# Patient Record
Sex: Male | Born: 1955 | Race: White | Hispanic: No | Marital: Single | Smoking: Former smoker
Health system: Southern US, Community
[De-identification: ages and names within clinical notes are randomized; demographics above are authoritative.]

## PROBLEM LIST (undated history)

## (undated) DIAGNOSIS — K219 Gastro-esophageal reflux disease without esophagitis: Secondary | ICD-10-CM

## (undated) DIAGNOSIS — E785 Hyperlipidemia, unspecified: Secondary | ICD-10-CM

## (undated) DIAGNOSIS — I639 Cerebral infarction, unspecified: Secondary | ICD-10-CM

## (undated) DIAGNOSIS — H269 Unspecified cataract: Secondary | ICD-10-CM

## (undated) DIAGNOSIS — I4819 Other persistent atrial fibrillation: Secondary | ICD-10-CM

## (undated) DIAGNOSIS — E119 Type 2 diabetes mellitus without complications: Secondary | ICD-10-CM

## (undated) DIAGNOSIS — F1010 Alcohol abuse, uncomplicated: Secondary | ICD-10-CM

## (undated) DIAGNOSIS — K5790 Diverticulosis of intestine, part unspecified, without perforation or abscess without bleeding: Secondary | ICD-10-CM

## (undated) DIAGNOSIS — G709 Myoneural disorder, unspecified: Secondary | ICD-10-CM

## (undated) DIAGNOSIS — I517 Cardiomegaly: Secondary | ICD-10-CM

## (undated) DIAGNOSIS — Z0189 Encounter for other specified special examinations: Secondary | ICD-10-CM

## (undated) DIAGNOSIS — I10 Essential (primary) hypertension: Secondary | ICD-10-CM

## (undated) HISTORY — DX: Cardiomegaly: I51.7

## (undated) HISTORY — DX: Unspecified cataract: H26.9

## (undated) HISTORY — DX: Hyperlipidemia, unspecified: E78.5

## (undated) HISTORY — DX: Type 2 diabetes mellitus without complications: E11.9

## (undated) HISTORY — DX: Other persistent atrial fibrillation: I48.19

## (undated) HISTORY — PX: CATARACT EXTRACTION: SUR2

## (undated) HISTORY — DX: Essential (primary) hypertension: I10

## (undated) HISTORY — PX: EYE SURGERY: SHX253

## (undated) HISTORY — PX: COLON SURGERY: SHX602

## (undated) HISTORY — PX: OSTOMY: SHX5997

## (undated) HISTORY — PX: INNER EAR SURGERY: SHX679

## (undated) HISTORY — DX: Alcohol abuse, uncomplicated: F10.10

## (undated) HISTORY — DX: Diverticulosis of intestine, part unspecified, without perforation or abscess without bleeding: K57.90

## (undated) HISTORY — PX: APPENDECTOMY: SHX54

## (undated) HISTORY — PX: COLONOSCOPY: SHX174

---

## 1977-02-24 HISTORY — PX: HAND SURGERY: SHX662

## 2004-05-27 HISTORY — PX: COLON SURGERY: SHX602

## 2004-09-14 ENCOUNTER — Inpatient Hospital Stay (HOSPITAL_COMMUNITY): Admission: EM | Admit: 2004-09-14 | Discharge: 2004-10-02 | Payer: Self-pay | Admitting: *Deleted

## 2004-09-14 ENCOUNTER — Encounter (INDEPENDENT_AMBULATORY_CARE_PROVIDER_SITE_OTHER): Payer: Self-pay | Admitting: *Deleted

## 2004-09-16 ENCOUNTER — Ambulatory Visit: Payer: Self-pay | Admitting: Internal Medicine

## 2004-09-17 ENCOUNTER — Encounter (INDEPENDENT_AMBULATORY_CARE_PROVIDER_SITE_OTHER): Payer: Self-pay | Admitting: *Deleted

## 2005-11-12 ENCOUNTER — Ambulatory Visit: Payer: Self-pay | Admitting: Internal Medicine

## 2005-11-22 ENCOUNTER — Ambulatory Visit: Payer: Self-pay | Admitting: Internal Medicine

## 2007-06-18 ENCOUNTER — Ambulatory Visit (HOSPITAL_BASED_OUTPATIENT_CLINIC_OR_DEPARTMENT_OTHER): Admission: RE | Admit: 2007-06-18 | Discharge: 2007-06-18 | Payer: Self-pay | Admitting: Orthopedic Surgery

## 2010-10-09 NOTE — Op Note (Signed)
NAME:  Angel Shelton, Angel Shelton NO.:  [id number]   MEDICAL RECORD NO.:  [id number]          PATIENT TYPE:  AMB   LOCATION:  DSC                          FACILITY:  [facility]   PHYSICIAN:  Leonides Grills, M.D.     DATE OF BIRTH:  19-Jun-1955   DATE OF PROCEDURE:  06/18/2007  DATE OF DISCHARGE:                               OPERATIVE REPORT   PREOPERATIVE DIAGNOSIS:  Closed right comminuted great toe proximal  phalanx fracture.   POSTOPERATIVE DIAGNOSIS:  Closed right comminuted great toe proximal  phalanx fracture.   OPERATION PERFORMED:  1. Open reduction,  internal fixation right great toe proximal phalanx      fracture.  2. Stress x-rays.   ANESTHESIA:  General.   SURGEON:  Leonides Grills, M.D.   ASSISTANT:  Richardean Canal, P.A.   ESTIMATED BLOOD LOSS:  Minimal.   TOURNIQUET TIME:  Approximately 40 minutes.   COMPLICATIONS:  None.   DISPOSITION:  Stable to the PAR.   INDICATIONS:  This is a 55 year old male who three weeks ago stubbed his  toe against the bed post sustaining the above injury.  He did not seek  any medical attention until yesterday at which point we evaluated him  and found that he had a comminuted and deformed right great toe proximal  phalanx fracture that was closed.  He has diabetes with possible  underlying neuropathy.  Previous consent for the above procedure.  All  risks including infection, neurovascular injury, nonunion, malunion of  hard tissue or other failures, persistent pain, worsened pain, prolonged  recovery __________  arthritis, the possibility of Charcot arthropathy,  loss of fixation were all explained, questions encouraged and answered.   DESCRIPTION OF PROCEDURE:  The patient was brought to the operating room  and placed in the supine position.  After adequate general endotracheal  anesthesia was administered as well as 1 g Ancef IVPB, the right lower  extremity was then prepped and draped in a sterile manner.  Proximal  thigh  tourniquet, the limb was gravity exsanguinated.  Tourniquet was  elevated to 190 mmHg.  A longitudinal incision dorsal aspect of the  right great toe, the proximal phalanx was then made.  Dissection was  carried out through skin.  Hemostasis was obtained.  EHL tendon was  identified and protected within its sheath and retracted out of harm's  way.  The fracture site was then entered.  There was an organized  hematoma that was debrided.  Fracture site was then freed up, and the  fracture was then reduced.  This was difficult due to the chronicity of  the injury and amount of scar tissue shortening in an extended position.  Once this was anatomically reduced with a two-point reduction clamp, we  then placed two 1.6 mm K-wires to provisionally fix this.  I felt that  due to the amount of comminution and the instability in the oblique  plane of the fracture and tendency to go into extension valgus, I wanted  to have axial fixation with the K-wires in a criss-cross manner.  However, due to the dorsal comminution, I did not  want to leave this in  this place.  I felt that he needed the dorsal plate to augment the  repair.  A mini fragment plate was then applied to the dorsal aspect of  the proximal phalanx and then four 1.5 mm fully threaded cortical set  screws were placed using 1.1 mm drill  holes respectively.  This had  good purchase and maintenance of the correction.  The tourniquet was  deflated.  hemostasis was obtained.  Subcu was closed with 3-0 Vicryl.  The skin was closed with 4-0 nylon.  Stress x-rays were obtained in a AP  and lateral planes.  No gross motion across the fixation site.  Fixation  and proposition in excellent alignment as well.  After the wound was  closed with 4-0 nylon, sterile dressing was applied.  Darco shoe was  applied.  The patient was stable to the PAR.      Leonides Grills, M.D.  Electronically Signed     PB/MEDQ  D:  06/18/2007  T:  06/18/2007  Job:   [id number]

## 2010-10-12 NOTE — Discharge Summary (Signed)
NAME:  Angel Shelton, Angel Shelton NO.:  [id number]   MEDICAL RECORD NO.:  [id number]          PATIENT TYPE:  INP   LOCATION:  5742                         FACILITY:  [facility]   PHYSICIAN:  Sharlet Salina T. Hoxworth, M.D.DATE OF BIRTH:  February 08, 1956   DATE OF ADMISSION:  09/14/2004  DATE OF DISCHARGE:  10/02/2004                                 DISCHARGE SUMMARY   DISCHARGE DIAGNOSES:  1.  Perforated sigmoid colon with peritonitis as well as septic shock,      status post colectomy, colostomy, and incidental appendectomy.  2.  Interloop fluid collection, resolving, status post intravenous      antibiotic treatment as an inpatient.  3.  Diabetes mellitus, treated.  4.  Postoperative atrial fibrillation, resolved.  5.  Polysubstance abuse (alcohol and tobacco).   HOSPITAL COURSE:  [name] is a 55 year old male patient who presented  to the emergency room on the day of admission complaining of upper  respiratory infection symptoms.  He had been taking a significant amount of  Advil for these symptoms.  In addition, he has significant alcohol use of  drinking six to eight beers per night.  In the emergency room while being  seen for his upper respiratory symptoms, he began having severe abdominal  pain, bloating, and developed a rigid abdomen.  An abdominal x-ray revealed  free air consistent with a perforated viscous.   He was taken to the operating room emergently and was found to have a  perforated sigmoid colon with diffuse peritonitis.  He then underwent an  exploratory laparotomy, sigmoid colectomy, colostomy, and an incidental  appendectomy by Dr. Avel Peace.  During his hospitalization he did  experience postoperative atrial fibrillation and was seen by [facility].  He was initially treated with IV amiodarone and digoxin,  however, by discharge he was switched to home dose of atenolol.   During this hospitalization he also was found to have an interloop  fluid  collection and was placed on IV antibiotics during his entire stay.  A  repeat CT scan revealed decreased fluid collection of the left lower  quadrant and this was felt to be resolving.  By postoperative day #17, the  patient was ready for discharge to home.  He did have an open abdominal  wound postoperatively and we have arranged a home health nurse to help him  with dressing changes on a daily basis.  He will be discharged to home in  stable condition.   DISCHARGE MEDICATIONS:  1.  Regular home medications which are Amaryl 2 mg a day, enalapril 5 mg a      day, atenolol 50 mg a day.  2.  His new medication is Vicodin 1-2 tablets q.6h. p.r.n. for pain.   He is instructed not to strain, no exercise, no lifting greater than 10  pounds.  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  He is to  follow a diabetic diet.  Wound care as per home health.  He is to call if he  has  a fever greater than 101.  He has a follow-up appointment with Dr. Avel Peace on Oct 12, 2004 at 11 a.m.      LB/MEDQ  D:  10/02/2004  T:  10/02/2004  Job:  [id number]   cc:   Adolph Pollack, M.D.  [address], [address]  [place]  [place] [address]

## 2010-10-12 NOTE — Consult Note (Signed)
NAME:  Angel Shelton, Angel Shelton NO.:  [id number]   MEDICAL RECORD NO.:  [id number]          PATIENT TYPE:  INP   LOCATION:  2114                         FACILITY:  [facility]   PHYSICIAN:  Arvilla Meres, M.D. [facility] OF BIRTH:  1956-03-18   DATE OF CONSULTATION:  09/16/2004  DATE OF DISCHARGE:                                   CONSULTATION   PRIMARY CARE PHYSICIAN:  None.   SURGEON:  Dr. Abbey Chatters.   PATIENT ID:  Angel Shelton is a very pleasant 55 year old male with a history  of hypertension, diabetes, and tobacco and alcohol abuse, but no known heart  disease, who is now postop day two from a sigmoid colectomy secondary to  intestinal rupture and diffuse peritonitis, who we are called to consult on  secondary to atrial fibrillation with rapid ventricular response.   Angel Shelton denies any previous history of known atrial fibrillation.  On  September 14, 2004 he presented to the emergency room complaining of upper  respiratory track symptoms along with high grade fevers.  While in the ER he  began developing severe abdominal pain and bloating and an abdominal x-ray  revealed free air consistent with perforated viscus.  He was taken to the  operating room and found to have a ruptured sigmoid colon with diffuse  peritonitis and septic shock.  This was debrided.  He underwent sigmoid  colectomy with appendectomy.  Postoperatively he has done fairly well.  He  was extubated quickly.  He has had some trouble with hypertension. This  afternoon around 1:00 he developed new-onset atrial fibrillation with rapid  ventricular response at a rate of 160.  This has been relatively  asymptomatic.  He was started on a Cardizem drip up to 15 mg an hour without  any result.  He did receive one dose of Lopressor with minimal affect.  We  were called for help with management.  He denies any palpitations, chest  pain or shortness of breath with this.  He does have a history of heavy  alcohol  use, but denies any history of alcohol withdrawal.   PAST MEDICAL HISTORY:  1.  Sigmoid colon perforation with diffuse peritonitis and septic shock      status post sigmoid colectomy on September 14, 2004.  2.  Hypertension.  3.  Tobacco use, 1/2 to 1 pack per day x30 years.  4.  Alcohol abuse.  5.  Diabetes mellitus.   CURRENT MEDICATIONS:  1.  IV Protonix.  2.  Morphine.  3.  Zosyn.  4.  IV fluids at 125 mL an hour.   HE HAS NO KNOWN DRUG ALLERGIES.   SOCIAL HISTORY:  [place] recently moved to [place] from [place].  He is  married.  He works as a Teacher, early years/pre.  He has a history of tobacco use 1/2  to 1 pack a day x30 years.  He drinks 6-8 beers per night.  He denies any  drug use.   FAMILY HISTORY:  His father died of an MI at age 83, his mother is alive and  well.  He has no brothers or sisters.  REVIEW OF SYSTEMS:  As per HPI.  He does complain of occasional abdominal  discomfort and fever.  Otherwise negative.   PHYSICAL EXAMINATION:  He is lying in bed, he is flushed, is comfortable,  does not appear agitated.  His respiratory status is unlabored.  His T-max  is 102.0, temperature current is 99.5, blood pressure 118/60 with a heart  rate of 140-160 in atrial fibrillation.  He is sating 97% on 3 liters.  HEENT:  Sclerae anicteric EOMI.  Mucous membranes are moist.  NECK:  Supple.  There is no JVD.  Carotids are 2+ bilaterally without any  bruits.  There is no lymphadenopathy or thyromegaly.  CARDIAC:  He is tachycardic and irregular without any murmurs, rubs or  gallops.  LUNGS:  Clear to auscultation.  ABDOMEN:  Distended, mildly firm with no bowel sounds.  His midline dressing  is clean, dry and intact.  His colostomy stoma is pink.  EXTREMITIES:  Warm, flushed.  There is no cyanosis, clubbing or edema.  NEUROLOGIC:  He is calm, appropriate affect.  Alert and oriented x3 and  otherwise nonfocal.   LABS:  White count 21.1, hematocrit 28, platelets 315.  Sodium 133,   potassium 4.0, chloride 104, bicarb 20, BUN 7, creatinine 0.8.  EKG shows  atrial fibrillation at a rate of 157 with nonspecific ST-T wave changes.   ASSESSMENT AND PLAN:  Postoperative atrial fibrillation with rapid  ventricular response.  This is clearly a stress rhythm and is not responding  to rate control efforts with beta-blocker or calcium channel blocker.  Thus  I would start IV amiodarone and discontinue his Cardizem.  He will need  continued hemodynamic support for his shock.  I also wonder whether alcohol  withdrawal may be contributing to his atrial fibrillation, though he does  not appear agitated.  I would strongly consider benzodiazepines as needed if  he gets progressively agitated.   The plan for now will be:  1.  Start amiodarone drip.  2.  No heparin at this time given the risk of bleeding.  We will start      aspirin 325 mg daily.  3.  Continue to follow on telemetry and rule out myocardial infarction with      serial enzymes.  4.  Check TSH.  5.  Continue IV fluids for hemodynamic support and check stat CBC to make      sure he is not bleeding.  6.  Consider benzodiazepines if evidence of alcohol withdrawal.      DB/MEDQ  D:  09/16/2004  T:  09/16/2004  Job:  [id number]

## 2010-10-12 NOTE — Op Note (Signed)
NAME:  Angel Shelton, TEMME NO.:  [id number]   MEDICAL RECORD NO.:  [id number]          PATIENT TYPE:  INP   LOCATION:  2550                         FACILITY:  [facility]   PHYSICIAN:  Adolph Pollack, M.D.DATE OF BIRTH:  11-04-1955   DATE OF PROCEDURE:  09/14/2004  DATE OF DISCHARGE:                                 OPERATIVE REPORT   PREOPERATIVE DIAGNOSIS:  Perforated viscus.   POSTOPERATIVE DIAGNOSES:  Perforated sigmoid colon, diffuse peritonitis.   PROCEDURE:  Exploratory laparotomy, sigmoid colectomy, colostomy, incidental  appendectomy.   SURGEON:  Adolph Pollack, M.D.   ASSISTANT:  Gita Kudo, M.D.   ANESTHESIA:  General.   INDICATIONS FOR PROCEDURE:  This is a 55 year old male who had been treated  for upper respiratory infection and developed abdominal pain that was  worsening. He presented to the emergency department here. During his  evaluation, he was found to have a pneumoperitoneum.  Subsequent surgical  consult was obtained and now he is being brought to the operating room for  exploratory laparotomy.   TECHNIQUE:  The patient was brought to the operating room, placed supine on  the operating table and general anesthetic was administered.  Hair from the  abdominal wall was clipped.  Foley catheter was placed.  Abdominal wall was  sterilely prepped and draped. An upper midline incision was made through the  skin and subcutaneous tissue, fascia and peritoneum.   Upon entering the abdominal cavity, copious amounts of cloudy, foul-smelling  fluid were encountered. I quickly examined the duodenum and stomach and  these were soft without evidence of perforation.  I then extended the  incision inferiorly.  There was a firm, indurated mass surrounded by allot  of purulent drainage. There were small bowel adhesions to the mass in the  sigmoid colon and I was able to free these up bluntly.  There was an abscess  cavity which I drained as well in  the left gutter area. This was consistent  with a perforated sigmoid colon most likely secondary to diverticulitis.   I began by mobilizing the mid descending and sigmoid colon by incising the  white line of Toldt.  I identified the gonadal vessels and kept my plane of  dissection anterior to these down into the pelvis.  I mobilized the sigmoid  colon and then divided the sigmoid colon, the sigmoid-descending colon  junction with the stapler.  Mesentery was divided between clamps close to  the colon and vessels were widely ligated. I took this down all the way to a  level below the abnormal area of colon until I found the rectosigmoid  junction which appeared to be normal. I subsequently divided the sigmoid  colon at the rectosigmoid junction and sent the specimen off the field.   I marked the rectal stump with interrupted 2-0 Prolene sutures. I then  further mobilized descending colon to be able to make a colostomy without  tension.  The appendix and mesoappendix appeared to be somewhat inflamed,  probably from being involved in the inflammatory process.  I subsequently  decided to do an incidental appendectomy.  I  divided the mesoappendix  between clamps and ligated the vessels. I then amputated the appendix off  the cecum with the stapling device and handed this off the field.   At this point, I copiously irrigated out the abdominal cavity with 8 L of  saline.  The fluid was clearing up.  There was no bleeding noted. I cleared  clots and debris out of the pelvis and the abdominal area.  I ran the entire  small bowel as well.  An NG-tube was verified within this position in the  stomach.  I asked for a sponge count and sponges were reported to be  correct.   The intestines were placed back into the abdominal cavity and omentum placed  over them.  I then closed the fascia with running #1 PDS suture and left the  skin open and packed with moist gauze via dry dressing.   I then  proceeded to mature the colostomy in a Brooke-type fashion.  I  anchored the colostomy initially to the anterior fascia with interrupted  Vicryl sutures and then matured it with interrupted 3-0 Vicryl sutures. A  stoma appliance was applied.   He tolerated the procedure fairly well. He was given a significant amount of  crystalloid.  He did have a low urine output consistent with a little bit of  a shocky state, as he was hypotensive in the emergency department.  Subsequently, he was then taken to the recovery room in critical condition,  and will be observed in critical care. He will be treated with broad-  spectrum antibiotics.  Risks of him developing recurrent infection are  fairly high.      TJR/MEDQ  D:  09/14/2004  T:  09/16/2004  Job:  [id number]

## 2010-10-12 NOTE — H&P (Signed)
NAME:  Angel Shelton, Angel Shelton NO.:  [id number]   MEDICAL RECORD NO.:  [id number]          PATIENT TYPE:  INP   LOCATION:  2114                         FACILITY:  [facility]   PHYSICIAN:  Gabrielle Dare. Janee Morn, M.D.DATE OF BIRTH:  12/11/1955   DATE OF ADMISSION:  09/14/2004  DATE OF DISCHARGE:                                HISTORY & PHYSICAL   HISTORY OF PRESENT ILLNESS:  Angel Shelton is a 55 year old male patient who  has been experiencing right ear pain and upper respiratory symptoms along  with fever for the past 3 days. He was treated initially at [facility]  with [facility] and returned to [facility] for reevaluation because he was  not getting better. He was placed on penicillin. Today, he still did not  feel better and came to the emergency room for evaluation. While in the  emergency room, he began having severe abdominal pain and bloating.  Abdominal x-ray revealed free air consistent with perforated viscous.   The patient has been taking a significant amount of Advil as well as  drinking 6-8 beers per night.   PAST MEDICAL HISTORY:  Includes diabetes and hypertension.   ALLERGIES:  No known drug allergies.   MEDICATIONS:  1.  Amaryl 2 mg a day.  2.  Enalapril 5 mg a day.  3.  Atenolol 50 mg a day.   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 He quit smoking a  week ago and he drinks 6-8 beers per night.   PHYSICAL EXAMINATION:  VITAL SIGNS:  Temperature 102.8, blood pressure is  98/50, pulse 100, respirations 20.  HEENT:  Grossly normal. No carotid or subclavian bruits. No JVD or  thyromegaly.  GENERAL:  His skin is pale and he appears to be in mild distress.  CHEST:  Clear to auscultation bilaterally. No wheezing or rhonchi.  HEART:  Regular rate and rhythm. No murmur, rubs, or ectopy.  ABDOMEN:  Mildly distended, diffusely tender, rigid. No bowel sounds. He has  no inguinal hernias.  LOWER EXTREMITIES:  No peripheral edema. Palpable lower  extremity pulses.  NEUROLOGIC:  Cranial nerves II-XII grossly intact.   ASSESSMENT AND PLAN:  1.  Perforated viscous.  2.  Diabetes mellitus.  3.  Hypertension.   The patient was seen and examined by Dr. Violeta Gelinas. At this time he  will be taken emergently to the operating room for urgent repair for what  appears to be a perforated ulcer.      LB/MEDQ  D:  09/14/2004  T:  09/15/2004  Job:  [id number]

## 2011-02-14 LAB — COMPREHENSIVE METABOLIC PANEL
ALT: 14
Alkaline Phosphatase: 35 — ABNORMAL LOW
GFR calc Af Amer: >60
Potassium: 4.1
Sodium: 135
Total Bilirubin: 0.8

## 2011-02-14 LAB — POCT HEMOGLOBIN-HEMACUE: Hemoglobin: 14.7

## 2014-09-23 ENCOUNTER — Telehealth: Payer: Self-pay | Admitting: Internal Medicine

## 2014-09-23 NOTE — Telephone Encounter (Signed)
Received referral packet on 09/23/2014 for appointment with Dr. Debara Pickett on 09/29/2014 from [facility].  Records given to [place], 23 pages.

## 2014-09-29 ENCOUNTER — Encounter: Payer: Self-pay | Admitting: Internal Medicine

## 2014-09-29 ENCOUNTER — Ambulatory Visit (INDEPENDENT_AMBULATORY_CARE_PROVIDER_SITE_OTHER): Payer: 59 | Admitting: Internal Medicine

## 2014-09-29 VITALS — BP 144/76 | HR 131 | Ht 71.0 in | Wt 189.8 lb

## 2014-09-29 DIAGNOSIS — R5383 Other fatigue: Secondary | ICD-10-CM | POA: Diagnosis not present

## 2014-09-29 DIAGNOSIS — E119 Type 2 diabetes mellitus without complications: Secondary | ICD-10-CM

## 2014-09-29 DIAGNOSIS — Z01818 Encounter for other preprocedural examination: Secondary | ICD-10-CM

## 2014-09-29 DIAGNOSIS — I4891 Unspecified atrial fibrillation: Secondary | ICD-10-CM

## 2014-09-29 DIAGNOSIS — Z79899 Other long term (current) drug therapy: Secondary | ICD-10-CM | POA: Diagnosis not present

## 2014-09-29 DIAGNOSIS — I10 Essential (primary) hypertension: Secondary | ICD-10-CM

## 2014-09-29 DIAGNOSIS — E1169 Type 2 diabetes mellitus with other specified complication: Secondary | ICD-10-CM | POA: Insufficient documentation

## 2014-09-29 DIAGNOSIS — D689 Coagulation defect, unspecified: Secondary | ICD-10-CM

## 2014-09-29 DIAGNOSIS — E785 Hyperlipidemia, unspecified: Secondary | ICD-10-CM

## 2014-09-29 MED ORDER — DILTIAZEM HCL ER 120 MG PO CP24: 120.0000 mg | ORAL_CAPSULE | Freq: Every day | ORAL | Status: DC

## 2014-09-29 NOTE — Patient Instructions (Signed)
- Dr. Debara Pickett has ordered a transesophageal echocardiogram with cardioversion.  - This is done at [place] will need to have labs & chest x-ray 3-5 days prior to procedure - [address] [employer])  >> you do NOT need an appointment  Your physician has recommended you make the following change in your medication...  1. STOP amlodipine.  2. START diltiazem 120mg  once daily   Transesophageal Echocardiogram Transesophageal echocardiography (TEE) is a picture test of your heart using sound waves. The pictures taken can give very detailed pictures of your heart. This can help your doctor see if there are problems with your heart. TEE can check:  If your heart has blood clots in it.  How well your heart valves are working.  If you have an infection on the inside of your heart.  Some of the major arteries of your heart.  If your heart valve is working after a Office manager.  Your heart before a procedure that uses a shock to your heart to get the rhythm back to normal. BEFORE THE PROCEDURE  Do not eat or drink for 6 hours before the procedure or as told by your doctor.  Make plans to have someone drive you home after the procedure. Do not drive yourself home.  An IV tube will be put in your arm. PROCEDURE  You will be given a medicine to help you relax (sedative). It will be given through the IV tube.  A numbing medicine will be sprayed or gargled in the back of your throat to help numb it.  The tip of the probe is placed into the back of your mouth. You will be asked to swallow. This helps to pass the probe into your esophagus.  Once the tip of the probe is in the right place, your doctor can take pictures of your heart.  You may feel pressure at the back of your throat. AFTER THE PROCEDURE  You will be taken to a recovery area so the sedative can wear off.  Your throat may be sore and scratchy. This will go away slowly over time.  You will go home  when you are fully awake and able to swallow liquids.  You should have someone stay with you for the next 24 hours.  Do not drive or operate machinery for the next 24 hours. Document Released: 03/10/2009 Document Revised: 05/18/2013 Document Reviewed: 11/12/2012 [facility] Patient Information 2015 [place], [place]. This information is not intended to replace advice given to you by your health care provider. Make sure you discuss any questions you have with your health care provider.   Electrical Cardioversion Electrical cardioversion is the delivery of a jolt of electricity to change the rhythm of the heart. Sticky patches or metal paddles are placed on the chest to deliver the electricity from a device. This is done to restore a normal rhythm. A rhythm that is too fast or not regular keeps the heart from pumping well. Electrical cardioversion is done in an emergency if:   There is low or no blood pressure as a result of the heart rhythm.   Normal rhythm must be restored as fast as possible to protect the brain and heart from further damage.   It may save a life. Cardioversion may be done for heart rhythms that are not immediately life threatening, such as atrial fibrillation or flutter, in which:   The heart is beating too fast or is not regular.   Medicine  to change the rhythm has not worked.   It is safe to wait in order to allow time for preparation.  Symptoms of the abnormal rhythm are bothersome.  The risk of stroke and other serious problems can be reduced. LET [facility] CARE PROVIDER KNOW ABOUT:   Any allergies you have.  All medicines you are taking, including vitamins, herbs, eye drops, creams, and over-the-counter medicines.  Previous problems you or members of your family have had with the use of anesthetics.   Any blood disorders you have.   Previous surgeries you have had.   Medical conditions you have. RISKS AND COMPLICATIONS  Generally, this is a safe  procedure. However, problems can occur and include:   Breathing problems related to the anesthetic used.  A blood clot that breaks free and travels to other parts of your body. This could cause a stroke or other problems. The risk of this is lowered by use of blood-thinning medicine (anticoagulant) prior to the procedure.  Cardiac arrest (rare). BEFORE THE PROCEDURE   You may have tests to detect blood clots in your heart and to evaluate heart function.  You may start taking anticoagulants so your blood does not clot as easily.   Medicines may be given to help stabilize your heart rate and rhythm. PROCEDURE  You will be given medicine through an IV tube to reduce discomfort and make you sleepy (sedative).   An electrical shock will be delivered. AFTER THE PROCEDURE Your heart rhythm will be watched to make sure it does not change.  Document Released: 05/03/2002 Document Revised: 09/27/2013 Document Reviewed: 11/25/2012 [facility] Patient Information 2015 [place], [place]. This information is not intended to replace advice given to you by your health care provider. Make sure you discuss any questions you have with your health care provider.

## 2014-09-29 NOTE — Progress Notes (Signed)
OFFICE NOTE  Chief Complaint:  New onset a-fib  Primary Care Physician: Angel Redwood, MD  HPI:  Angel Shelton is a pleasant 59 year old male who is currently referred to me by Dr. Brigitte Shelton. In the office during a physical exam which is routine, it was noted that he had an irregular heartbeat. An EKG was performed which demonstrated atrial fibrillation with rapid ventricular response. Angel Shelton tells me he had a physical exam 3 months prior and his heart was noted to be regular. Therefore he suspects that some point he went into atrial fibrillation without his knowledge, as he is completely asymptomatic with it. Today's heart rate is are elevated into the 130s. His primary care provider switched him from atenolol over to metoprolol at a higher dose of 100 mg daily. Spite this heart rate still remains uncontrolled. He was also started on Eliquis 5 mg twice daily which he has been taking regularly for about 1 week. He denies any chest pain or worsening shortness of breath. He is a former smoker but quit on 03/05/2014. His dad does have a history of heart disease in his late 23s. He's also a diabetic.  PMHx:  Past Medical History  Diagnosis Date  . Arrhythmia   . Diabetes mellitus without complication   . Hyperlipidemia   . Hypertension     History reviewed. No pertinent past surgical history.  FAMHx:  Family History  Problem Relation Age of Onset  . Heart disease Father     SOCHx:   reports that he quit smoking about 6 months ago. He does not have any smokeless tobacco history on file. His alcohol and drug histories are not on file.  ALLERGIES:  No Known Allergies  ROS: A comprehensive review of systems was negative.  HOME MEDS: Current Outpatient Prescriptions  Medication Sig Dispense Refill  . amitriptyline (ELAVIL) 25 MG tablet Take 25 mg by mouth at bedtime.    Marland Kitchen atorvastatin (LIPITOR) 20 MG tablet Take 20 mg by mouth daily.    Marland Kitchen ELIQUIS 5 MG TABS tablet Take 5  mg by mouth 2 (two) times daily.  1  . fenofibrate 160 MG tablet Take 160 mg by mouth daily.    Marland Kitchen glimepiride (AMARYL) 1 MG tablet Take 1 mg by mouth daily.  0  . JENTADUETO 2.09-998 MG TABS Take 1 tablet by mouth 2 (two) times daily.  1  . lisinopril (PRINIVIL,ZESTRIL) 20 MG tablet Take 20 mg by mouth daily.  0  . metoprolol succinate (TOPROL-XL) 100 MG 24 hr tablet Take 100 mg by mouth daily.  1  . diltiazem (DILACOR XR) 120 MG 24 hr capsule Take 1 capsule (120 mg total) by mouth daily. 30 capsule 6   No current facility-administered medications for this visit.    LABS/IMAGING: No results found for this or any previous visit (from the past 48 hour(s)). No results found.  WEIGHTS: Wt Readings from Last 3 Encounters:  09/29/14 189 lb 12.8 oz (86.093 kg)    VITALS: BP 144/76 mmHg  Shelton 131  Ht 5\' 11"  (1.803 m)  Wt 189 lb 12.8 oz (86.093 kg)  BMI 26.48 kg/m2  EXAM: General appearance: alert and no distress Neck: no carotid bruit and no JVD Lungs: clear to auscultation bilaterally Heart: irregularly irregular rhythm and Tachycardic, no murmur Abdomen: soft, non-tender; bowel sounds normal; no masses,  no organomegaly Extremities: extremities normal, atraumatic, no cyanosis or edema Pulses: 2+ and symmetric Skin: Skin color, texture, turgor normal. No rashes  or lesions Neurologic: Grossly normal Psych: Anxious, some psychomotor agitation  EKG: Age of fibrillation with rapid ventricular response at 131  ASSESSMENT: 1. New onset atrial fibrillation with rapid ventricular response 2. Essential hypertension 3. Dyslipidemia 4. Diabetes type 2 5. Family history of coronary disease  PLAN: 1.   Angel Shelton has new onset atrial fibrillation which likely has occurred within the past few months. He is been tachycardic and unaware of his arrhythmia. There is a possibility he could've developed some tachycardia induced cardiomyopathy. He's been started on Eliquis and fortunately  has not had a stroke. Despite the change in his beta blocker his heart rate remains elevated. I would like to add diltiazem LA 120 mg daily to his regimen today. I will stop his amlodipine. Hopefully this will give him better rate control. We talked about several management strategies including a month of anticoagulation followed by cardioversion versus TEE/cardioversion, he is agreeable to an earlier intervention with TEE cardioversion. This should also help [place] determine whether or not he has any evidence for cardiomyopathy and better adjust his medications. Ultimately if we can get him back in sinus rhythm I would recommend a stress test to rule out any ischemia as a possible cause of his A. Fib.  We will go ahead and schedule his TEE cardioversion and I'll keep you informed on those results. Thanks again for the kind referral as always. I sincerely appreciate it.  Angel Casino, MD, [facility] Attending Cardiologist Angel Shelton 09/29/2014, 10:06 PM

## 2014-09-30 ENCOUNTER — Other Ambulatory Visit: Payer: Self-pay | Admitting: *Deleted

## 2014-09-30 DIAGNOSIS — I4891 Unspecified atrial fibrillation: Secondary | ICD-10-CM

## 2014-09-30 DIAGNOSIS — Z0189 Encounter for other specified special examinations: Secondary | ICD-10-CM

## 2014-10-27 ENCOUNTER — Ambulatory Visit
Admission: RE | Admit: 2014-10-27 | Discharge: 2014-10-27 | Disposition: A | Discharge status: Home / Self Care | Payer: 59 | Source: Ambulatory Visit | Attending: Internal Medicine | Admitting: Internal Medicine

## 2014-10-27 DIAGNOSIS — Z01818 Encounter for other preprocedural examination: Secondary | ICD-10-CM

## 2014-10-28 LAB — PROTIME-INR
INR: 1.18 (ref <1.50)
Prothrombin Time: 15 seconds (ref 11.6–15.2)

## 2014-10-28 LAB — BASIC METABOLIC PANEL
BUN: 10 mg/dL (ref 6–23)
CALCIUM: 9.5 mg/dL (ref 8.4–10.5)
CO2: 27 mEq/L (ref 19–32)
Chloride: 96 mEq/L (ref 96–112)
Creat: 0.89 mg/dL (ref 0.50–1.35)
Glucose, Bld: 146 mg/dL — ABNORMAL HIGH (ref 70–99)
Potassium: 5.3 mEq/L (ref 3.5–5.3)
Sodium: 129 mEq/L — ABNORMAL LOW (ref 135–145)

## 2014-10-28 LAB — APTT: APTT: 31 s (ref 24–37)

## 2014-10-28 LAB — TSH: TSH: 2.626 u[IU]/mL (ref 0.350–4.500)

## 2014-10-28 LAB — CBC
HEMATOCRIT: 44.7 % (ref 39.0–52.0)
HEMOGLOBIN: 15.6 g/dL (ref 13.0–17.0)
MCH: 32.3 pg (ref 26.0–34.0)
MCHC: 34.9 g/dL (ref 30.0–36.0)
MCV: 92.5 fL (ref 78.0–100.0)
MPV: 12.3 fL (ref 8.6–12.4)
Platelets: 179 10*3/uL (ref 150–400)
RBC: 4.83 MIL/uL (ref 4.22–5.81)
RDW: 13.4 % (ref 11.5–15.5)
WBC: 4.7 10*3/uL (ref 4.0–10.5)

## 2014-11-01 ENCOUNTER — Ambulatory Visit (HOSPITAL_COMMUNITY)
Admission: RE | Admit: 2014-11-01 | Discharge: 2014-11-01 | Disposition: A | Discharge status: Home / Self Care | Payer: 59 | Source: Ambulatory Visit | Attending: Internal Medicine | Admitting: Internal Medicine

## 2014-11-01 ENCOUNTER — Encounter (HOSPITAL_COMMUNITY)
Admission: RE | Disposition: A | Discharge status: Home / Self Care | Payer: Self-pay | Source: Ambulatory Visit | Attending: Internal Medicine

## 2014-11-01 ENCOUNTER — Ambulatory Visit (HOSPITAL_COMMUNITY): Payer: 59

## 2014-11-01 ENCOUNTER — Encounter (HOSPITAL_COMMUNITY): Payer: Self-pay | Admitting: *Deleted

## 2014-11-01 DIAGNOSIS — I4891 Unspecified atrial fibrillation: Secondary | ICD-10-CM | POA: Diagnosis not present

## 2014-11-01 DIAGNOSIS — E119 Type 2 diabetes mellitus without complications: Secondary | ICD-10-CM | POA: Diagnosis not present

## 2014-11-01 DIAGNOSIS — Z79899 Other long term (current) drug therapy: Secondary | ICD-10-CM | POA: Insufficient documentation

## 2014-11-01 DIAGNOSIS — Z87891 Personal history of nicotine dependence: Secondary | ICD-10-CM | POA: Diagnosis not present

## 2014-11-01 DIAGNOSIS — I10 Essential (primary) hypertension: Secondary | ICD-10-CM | POA: Diagnosis not present

## 2014-11-01 DIAGNOSIS — Z0189 Encounter for other specified special examinations: Secondary | ICD-10-CM | POA: Insufficient documentation

## 2014-11-01 DIAGNOSIS — I700 Atherosclerosis of aorta: Secondary | ICD-10-CM | POA: Diagnosis not present

## 2014-11-01 HISTORY — PX: CARDIOVERSION: SHX1299

## 2014-11-01 HISTORY — PX: TEE WITHOUT CARDIOVERSION: SHX5443

## 2014-11-01 LAB — GLUCOSE, CAPILLARY: Glucose-Capillary: 175 mg/dL — ABNORMAL HIGH (ref 65–99)

## 2014-11-01 SURGERY — ECHOCARDIOGRAM, TRANSESOPHAGEAL
Anesthesia: General

## 2014-11-01 MED ORDER — PROPOFOL 10 MG/ML IV BOLUS
INTRAVENOUS | Status: DC | PRN
  Administered 2014-11-01: 20 mg via INTRAVENOUS
  Administered 2014-11-01 (×2): 30 mg via INTRAVENOUS

## 2014-11-01 MED ORDER — LACTATED RINGERS IV SOLN
INTRAVENOUS | Status: DC | PRN
  Administered 2014-11-01 (×2): via INTRAVENOUS

## 2014-11-01 MED ORDER — BUTAMBEN-TETRACAINE-BENZOCAINE 2-2-14 % EX AERO
INHALATION_SPRAY | CUTANEOUS | Status: DC | PRN
  Administered 2014-11-01: 2 via TOPICAL

## 2014-11-01 MED ORDER — MIDAZOLAM HCL 5 MG/5ML IJ SOLN
INTRAMUSCULAR | Status: DC | PRN
  Administered 2014-11-01 (×2): 1 mg via INTRAVENOUS

## 2014-11-01 MED ORDER — PROPOFOL INFUSION 10 MG/ML OPTIME
INTRAVENOUS | Status: DC | PRN
  Administered 2014-11-01: 75 ug/kg/min via INTRAVENOUS

## 2014-11-01 MED ORDER — LIDOCAINE VISCOUS 2 % MT SOLN
OROMUCOSAL | Status: AC
  Filled 2014-11-01: qty 15

## 2014-11-01 MED ORDER — SODIUM CHLORIDE 0.9 % IV SOLN
INTRAVENOUS | Status: DC
  Administered 2014-11-01: 500 mL via INTRAVENOUS

## 2014-11-01 MED ORDER — SODIUM CHLORIDE 0.9 % IJ SOLN: 3.0000 mL | INTRAMUSCULAR | Status: DC | PRN

## 2014-11-01 MED ORDER — APIXABAN 5 MG PO TABS
5.0000 mg | ORAL_TABLET | Freq: Once | ORAL | Status: AC
  Administered 2014-11-01: 5 mg via ORAL
  Filled 2014-11-01: qty 1

## 2014-11-01 MED ORDER — DILTIAZEM HCL ER 120 MG PO CP24: 120.0000 mg | ORAL_CAPSULE | Freq: Two times a day (BID) | ORAL | Status: DC

## 2014-11-01 MED ORDER — LIDOCAINE VISCOUS 2 % MT SOLN
OROMUCOSAL | Status: DC | PRN
  Administered 2014-11-01: 15 mL via OROMUCOSAL

## 2014-11-01 MED ORDER — LIDOCAINE HCL (CARDIAC) 20 MG/ML IV SOLN
INTRAVENOUS | Status: DC | PRN
  Administered 2014-11-01: 60 mg via INTRAVENOUS
  Administered 2014-11-01: 40 mg via INTRAVENOUS

## 2014-11-01 NOTE — Transfer of Care (Signed)
Immediate Anesthesia Transfer of Care Note  Patient: Angel Shelton  Procedure(s) Performed: Procedure(s): TRANSESOPHAGEAL ECHOCARDIOGRAM (TEE) (N/A) CARDIOVERSION (N/A)  Patient Location: Endoscopy Unit  Anesthesia Type:MAC  Level of Consciousness: awake, alert  and oriented  Airway & Oxygen Therapy: Patient Spontanous Breathing and Patient connected to nasal cannula oxygen  Post-op Assessment: Report given to RN and Post -op Vital signs reviewed and stable  Post vital signs: Reviewed and stable  Last Vitals:  Filed Vitals:   11/01/14 1242  BP: 150/88  Pulse: 115  Temp:   Resp: 18    Complications: No apparent anesthesia complications

## 2014-11-01 NOTE — CV Procedure (Signed)
TEE/CARDIOVERSION NOTE  TRANSESOPHAGEAL ECHOCARDIOGRAM (TEE):  Indictation: Atrial Fibrillation  Consent:   Informed consent was obtained prior to the procedure. The risks, benefits and alternatives for the procedure were discussed and the patient comprehended these risks.  Risks include, but are not limited to, cough, sore throat, vomiting, nausea, somnolence, esophageal and stomach trauma or perforation, bleeding, low blood pressure, aspiration, pneumonia, infection, trauma to the teeth and death.    Time Out: Verified patient identification, verified procedure, site/side was marked, verified correct patient position, special equipment/implants available, medications/allergies/relevent history reviewed, required imaging and test results available. Performed  Procedure:  After a procedural time-out, the patient was given Propofol per anesthesia for sedation.  The oropharynx was anesthetized 10 cc of topical 1% viscous lidocaine and 2 cetacaine sprays.  The transesophageal probe was inserted in the esophagus and stomach without difficulty and multiple views were obtained.  The patient was kept under observation until the patient left the procedure room.  The patient left the procedure room in stable condition.   Agitated microbubble saline contrast was administered.  Complications:    Complications: None Patient did tolerate procedure well.  Findings:  1. LEFT VENTRICLE: The left ventricular wall thickness is mildly increased.  The left ventricular cavity is normal in size. Wall motion is normal.  LVEF is 55%.  2. RIGHT VENTRICLE:  The right ventricle is normal in structure and function without any thrombus or masses.    3. LEFT ATRIUM:  The left atrium is dilated in size without any thrombus or masses.  There is not spontaneous echo contrast ("smoke") in the left atrium consistent with a low flow state.  4. LEFT ATRIAL APPENDAGE:  The left atrial appendage is free of any  thrombus or masses. The appendage has single lobes. Pulse doppler indicates moderate flow in the appendage.  5. ATRIAL SEPTUM:  The atrial septum appears intact and is free of thrombus and/or masses.  There is no evidence for interatrial shunting by color doppler and saline microbubble.  6. RIGHT ATRIUM:  The right atrium is normal in size and function without any thrombus or masses.  7. MITRAL VALVE:  The mitral valve is normal in structure and function with trivial regurgitation.  There were no vegetations or stenosis.  8. AORTIC VALVE:  The aortic valve is trileaflet, normal in structure and function with no regurgitation.  There were no vegetations or stenosis  9. TRICUSPID VALVE:  The tricuspid valve is normal in structure and function with Mild regurgitation.  There were no vegetations or stenosis  10.  PULMONIC VALVE:  The pulmonic valve is normal in structure and function with trivial regurgitation.  There were no vegetations or stenosis.   11. AORTIC ARCH, ASCENDING AND DESCENDING AORTA:  There was grade 3 Ron Parker et. Al, 1992) atherosclerosis of the proximal descending aorta.  12. PULMONARY VEINS: Anomalous pulmonary venous return was not noted.  13. PERICARDIUM: The pericardium appeared normal and non-thickened.  There is no pericardial effusion.  CARDIOVERSION:     Second Time Out: Verified patient identification, verified procedure, site/side was marked, verified correct patient position, special equipment/implants available, medications/allergies/relevent history reviewed, required imaging and test results available.  Performed  Procedure:  1. Patient placed on cardiac monitor, pulse oximetry, supplemental oxygen as necessary.  2. Sedation administered per anesthesia 3. Pacer pads placed anterior and posterior chest. 4. Cardioverted 3 time(s).  5. Cardioverted at 150J, 200J and 200J biphasic.  Complications:  Complications: None Patient did tolerate procedure  well.  Impression:  1. No LAA thrombus 2. Negative for PFO 3. 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 Unsuccessful DCCV  Recommendations:  1. Increase diltiazem to 120 mg LA q12 hours. Continue current dose metoprolol, for better rate control. 2. Long discussion about alcohol cessation - he drinks 2-3 large glasses of alcohol, which is about 3/4 alcohol, ice and some mixer, daily - this is likely contributing to his afib 3. Given his aortic atherosclerosis, there is likely CAD as well - will need stress testing and more optimization prior to considering repeat cardioversion 4. May need antiarrythmic therapy. I would like to see alcohol abstinence first - it is unsafe to use antiarrhythmics with alcohol.  5. Should continue anticoagulation without interruption - also re-iterated the danger of being on blood thinners with alcohol use. 6. Plan follow-up in 1 month with me  Time Spent Directly with the Patient:  60 minutes   Pixie Casino, MD, [facility] Attending Cardiologist [facility]  11/01/2014, 12:58 PM

## 2014-11-01 NOTE — Addendum Note (Signed)
Addendum  created 11/01/14 1307 by [place] Pink, CRNA   Modules edited: Anesthesia Attestations

## 2014-11-01 NOTE — H&P (Signed)
     INTERVAL PROCEDURE H&P  History and Physical Interval Note:  11/01/2014 11:39 AM  Arlester Marker has presented today for their planned procedure. The various methods of treatment have been discussed with the patient and family. After consideration of risks, benefits and other options for treatment, the patient has consented to the procedure.  The patients' outpatient history has been reviewed, patient examined, and no change in status from most recent office note within the past 30 days. I have reviewed the patients' chart and labs and will proceed as planned. Questions were answered to the patient's satisfaction.   Pixie Casino, MD, [facility] Attending Cardiologist [place] 11/01/2014, 11:39 AM

## 2014-11-01 NOTE — Discharge Instructions (Signed)
Transesophageal Echocardiogram Transesophageal echocardiography (TEE) is a picture test of your heart using sound waves. The pictures taken can give very detailed pictures of your heart. This can help your doctor see if there are problems with your heart. TEE can check:  If your heart has blood clots in it.  How well your heart valves are working.  If you have an infection on the inside of your heart.  Some of the major arteries of your heart.  If your heart valve is working after a Office manager.  Your heart before a procedure that uses a shock to your heart to get the rhythm back to normal. BEFORE THE PROCEDURE  Do not eat or drink for 6 hours before the procedure or as told by your doctor.  Make plans to have someone drive you home after the procedure. Do not drive yourself home.  An IV tube will be put in your arm. PROCEDURE  You will be given a medicine to help you relax (sedative). It will be given through the IV tube.  A numbing medicine will be sprayed or gargled in the back of your throat to help numb it.  The tip of the probe is placed into the back of your mouth. You will be asked to swallow. This helps to pass the probe into your esophagus.  Once the tip of the probe is in the right place, your doctor can take pictures of your heart.  You may feel pressure at the back of your throat. AFTER THE PROCEDURE  You will be taken to a recovery area so the sedative can wear off.  Your throat may be sore and scratchy. This will go away slowly over time.  You will go home when you are fully awake and able to swallow liquids.  You should have someone stay with you for the next 24 hours.  Do not drive or operate machinery for the next 24 hours. Document Released: 03/10/2009 Document Revised: 05/18/2013 Document Reviewed: 11/12/2012 [facility] Patient Information 2015 [place], [place]. This information is not intended to replace advice given to you by your health care provider. Make  sure you discuss any questions you have with your health care provider.   Alcohol Use Disorder Alcohol use disorder is a mental disorder. It is not a one-time incident of heavy drinking. Alcohol use disorder is the excessive and uncontrollable use of alcohol over time that leads to problems with functioning in one or more areas of daily living. People with this disorder risk harming themselves and others when they drink to excess. Alcohol use disorder also can cause other mental disorders, such as mood and anxiety disorders, and serious physical problems. People with alcohol use disorder often misuse other drugs.  Alcohol use disorder is common and widespread. Some people with this disorder drink alcohol to cope with or escape from negative life events. Others drink to relieve chronic pain or symptoms of mental illness. People with a family history of alcohol use disorder are at higher risk of losing control and using alcohol to excess.  SYMPTOMS  Signs and symptoms of alcohol use disorder may include the following:  Consumption ofalcohol inlarger amounts or over a longer period of time than intended. Multiple unsuccessful attempts to cutdown or control alcohol use.  A great deal of time spent obtaining alcohol, using alcohol, or recovering from the effects of alcohol (hangover). A strong desire or urge to use alcohol (cravings).  Continued use of alcohol despite problems at work, school, or home because  of alcohol use.  Continued use of alcohol despite problems in relationships because of alcohol use. Continued use of alcohol in situations when it is physically hazardous, such as driving a car. Continued use of alcohol despite awareness of a physical or psychological problem that is likely related to alcohol use. Physical problems related to alcohol use can involve the brain, heart, liver, stomach, and intestines. Psychological problems related to alcohol use include intoxication, depression,  anxiety, psychosis, delirium, and dementia.  The need for increased amounts of alcohol to achieve the same desired effect, or a decreased effect from the consumption of the same amount of alcohol (tolerance). Withdrawal symptoms upon reducing or stopping alcohol use, or alcohol use to reduce or avoid withdrawal symptoms. Withdrawal symptoms include: Racing heart. Hand tremor. Difficulty sleeping. Nausea. Vomiting. Hallucinations. Restlessness. Seizures. DIAGNOSIS Alcohol use disorder is diagnosed through an assessment by your health care provider. Your health care provider may start by asking three or four questions to screen for excessive or problematic alcohol use. To confirm a diagnosis of alcohol use disorder, at least two symptoms must be present within a 9-month period. The severity of alcohol use disorder depends on the number of symptoms: Mild--two or three. Moderate--four or five. Severe--six or more. Your health care provider may perform a physical exam or use results from lab tests to see if you have physical problems resulting from alcohol use. Your health care provider may refer you to a mental health professional for evaluation. TREATMENT  Some people with alcohol use disorder are able to reduce their alcohol use to low-risk levels. Some people with alcohol use disorder need to quit drinking alcohol. When necessary, mental health professionals with specialized training in substance use treatment can help. Your health care provider can help you decide how severe your alcohol use disorder is and what type of treatment you need. The following forms of treatment are available:  Detoxification. Detoxification involves the use of prescription medicines to prevent alcohol withdrawal symptoms in the first week after quitting. This is important for people with a history of symptoms of withdrawal and for heavy drinkers who are likely to have withdrawal symptoms. Alcohol withdrawal can be  dangerous and, in severe cases, cause death. Detoxification is usually provided in a hospital or in-patient substance use treatment facility. Counseling or talk therapy. Talk therapy is provided by substance use treatment counselors. It addresses the reasons people use alcohol and ways to keep them from drinking again. The goals of talk therapy are to help people with alcohol use disorder find healthy activities and ways to cope with life stress, to identify and avoid triggers for alcohol use, and to handle cravings, which can cause relapse. Medicines.Different medicines can help treat alcohol use disorder through the following actions: Decrease alcohol cravings. Decrease the positive reward response felt from alcohol use. Produce an uncomfortable physical reaction when alcohol is used (aversion therapy). Support groups. Support groups are run by people who have quit drinking. They provide emotional support, advice, and guidance. These forms of treatment are often combined. Some people with alcohol use disorder benefit from intensive combination treatment provided by specialized substance use treatment centers. Both inpatient and outpatient treatment programs are available. Document Released: 06/20/2004 Document Revised: 09/27/2013 Document Reviewed: 08/20/2012 [facility] Patient Information 2015 [place], [place]. This information is not intended to replace advice given to you by your health care provider. Make sure you discuss any questions you have with your health care provider.

## 2014-11-01 NOTE — Anesthesia Postprocedure Evaluation (Signed)
  Anesthesia Post-op Note  Patient: Angel Shelton  Procedure(s) Performed: Procedure(s): TRANSESOPHAGEAL ECHOCARDIOGRAM (TEE) (N/A) CARDIOVERSION (N/A)  Patient Location: Endoscopy Unit  Anesthesia Type:General  Level of Consciousness: awake  Airway and Oxygen Therapy: Patient Spontanous Breathing  Post-op Pain: none  Post-op Assessment: Post-op Vital signs reviewed, Patient's Cardiovascular Status Stable, Respiratory Function Stable, Patent Airway, No signs of Nausea or vomiting and Pain level controlled  Post-op Vital Signs: Reviewed and stable  Last Vitals:  Filed Vitals:   11/01/14 1242  BP: 150/88  Pulse: 115  Temp:   Resp: 18    Complications: No apparent anesthesia complications

## 2014-11-01 NOTE — Anesthesia Preprocedure Evaluation (Addendum)
Anesthesia Evaluation  Patient identified by MRN, date of birth, ID band Patient awake    Reviewed: Allergy & Precautions, NPO status , Patient's Chart, lab work & pertinent test results  History of Anesthesia Complications Negative for: history of anesthetic complications  Airway Mallampati: II  TM Distance: >3 FB Neck ROM: Full    Dental  (+) Teeth Intact   Pulmonary neg shortness of breath, neg sleep apnea, neg COPDRecent URI , Resolved, former smoker, neg PE breath sounds clear to auscultation        Cardiovascular hypertension, Pt. on medications and Pt. on home beta blockers - angina- Past MI and - CHF + dysrhythmias Atrial Fibrillation Rhythm:Irregular     Neuro/Psych negative neurological ROS  negative psych ROS   GI/Hepatic negative GI ROS, Neg liver ROS,   Endo/Other  diabetes, Type 2, Oral Hypoglycemic Agents  Renal/GU negative Renal ROS     Musculoskeletal   Abdominal   Peds  Hematology   Anesthesia Other Findings   Reproductive/Obstetrics                           Anesthesia Physical Anesthesia Plan  ASA: III  Anesthesia Plan: General   Post-op Pain Management:    Induction: Intravenous  Airway Management Planned: Nasal Cannula  Additional Equipment: None  Intra-op Plan:   Post-operative Plan: Extubation in OR  Informed Consent: I have reviewed the patients History and Physical, chart, labs and discussed the procedure including the risks, benefits and alternatives for the proposed anesthesia with the patient or authorized representative who has indicated his/her understanding and acceptance.   Dental advisory given  Plan Discussed with: CRNA and Surgeon  Anesthesia Plan Comments:         Anesthesia Quick Evaluation

## 2014-11-03 ENCOUNTER — Encounter (HOSPITAL_COMMUNITY): Payer: Self-pay | Admitting: Internal Medicine

## 2014-11-24 ENCOUNTER — Encounter: Payer: Self-pay | Admitting: Internal Medicine

## 2014-11-24 ENCOUNTER — Ambulatory Visit (INDEPENDENT_AMBULATORY_CARE_PROVIDER_SITE_OTHER): Payer: 59 | Admitting: Internal Medicine

## 2014-11-24 VITALS — BP 138/72 | HR 92 | Ht 71.0 in | Wt 186.9 lb

## 2014-11-24 DIAGNOSIS — I10 Essential (primary) hypertension: Secondary | ICD-10-CM | POA: Diagnosis not present

## 2014-11-24 DIAGNOSIS — I481 Persistent atrial fibrillation: Secondary | ICD-10-CM

## 2014-11-24 DIAGNOSIS — I4821 Permanent atrial fibrillation: Secondary | ICD-10-CM | POA: Insufficient documentation

## 2014-11-24 DIAGNOSIS — I4819 Other persistent atrial fibrillation: Secondary | ICD-10-CM

## 2014-11-24 DIAGNOSIS — I4891 Unspecified atrial fibrillation: Secondary | ICD-10-CM

## 2014-11-24 NOTE — Patient Instructions (Signed)
Your physician has requested that you have a lexiscan myoview. For further information please visit [email or web address]. Please follow instruction sheet, as given.  Your physician recommends that you schedule a follow-up appointment in 1 month  Considering starting antiarrhythmic therapy with flecainide or sotalol - then repeat cardioversion in 2-3 weeks

## 2014-11-24 NOTE — Progress Notes (Signed)
OFFICE NOTE  Chief Complaint:  Follow-up cardioversion  Primary Care Physician: Marton Redwood, MD  HPI:  Angel Shelton is a pleasant 59 year old male who is currently referred to me by Dr. Brigitte Pulse. In the office during a physical exam which is routine, it was noted that he had an irregular heartbeat. An EKG was performed which demonstrated atrial fibrillation with rapid ventricular response. Angel Shelton tells me he had a physical exam 3 months prior and his heart was noted to be regular. Therefore he suspects that some point he went into atrial fibrillation without his knowledge, as he is completely asymptomatic with it. Today's heart rate is are elevated into the 130s. His primary care provider switched him from atenolol over to metoprolol at a higher dose of 100 mg daily. Spite this heart rate still remains uncontrolled. He was also started on Eliquis 5 mg twice daily which he has been taking regularly for about 1 week. He denies any chest pain or worsening shortness of breath. He is a former smoker but quit on 03/05/2014. His dad does have a history of heart disease in his late 80s. He's also a diabetic.  Angel Shelton returns today for follow-up. He underwent cardioversion unfortunately was unsuccessful after 3 attempts. There was no evidence of even a short period of normal sinus rhythm. After additional questioning it turns out that he does have a moderate amount of alcohol use and this likely contributes to his symptoms. Fortunately his TEE showed no evidence of thrombus and EF was preserved at 55%. I therefore increased his diltiazem for increased rate control and heart rate today is improved in the low 90s. He remains fairly asymptomatic. He's continued to take Eliquis without any significant bleeding difficulty. We discussed several options as far as going forward with rate or rhythm control. Given his age and new diagnosis of atrial fibrillation, I'm strongly in favor of rhythm control.  At this point though it seems that he may need antiarrhythmics medication. We discussed the importance of excluding coronary artery disease based on his family history of heart disease and I would recommend that he undergo a Lexiscan nuclear stress test prior to starting an antiarrhythmics medication. I would prefer option such as flecainide or sotalol for an antiarrhythmics. He does have several cardiology providers and his family and he wishes to discuss it with them further which is certainly fine with me.  PMHx:  Past Medical History  Diagnosis Date  . Arrhythmia   . Diabetes mellitus without complication   . Hyperlipidemia   . Hypertension     Past Surgical History  Procedure Laterality Date  . Tee without cardioversion N/A 11/01/2014    Procedure: TRANSESOPHAGEAL ECHOCARDIOGRAM (TEE);  Surgeon: Pixie Casino, MD;  Location: [facility] ENDOSCOPY;  Service: Cardiovascular;  Laterality: N/A;  . Cardioversion N/A 11/01/2014    Procedure: CARDIOVERSION;  Surgeon: Pixie Casino, MD;  Location: [facility] ENDOSCOPY;  Service: Cardiovascular;  Laterality: N/A;    FAMHx:  Family History  Problem Relation Age of Onset  . Heart disease Father     SOCHx:   reports that he quit smoking about 8 months ago. He does not have any smokeless tobacco history on file. He reports that he drinks alcohol. He reports that he does not use illicit drugs.  ALLERGIES:  No Known Allergies  ROS: A comprehensive review of systems was negative.  HOME MEDS: Current Outpatient Prescriptions  Medication Sig Dispense Refill  . amitriptyline (ELAVIL) 25 MG tablet Take  25 mg by mouth at bedtime.    Marland Kitchen atorvastatin (LIPITOR) 20 MG tablet Take 20 mg by mouth daily.    . Cyanocobalamin (VITAMIN B-12 PO) Take 1 tablet by mouth daily.    Marland Kitchen diltiazem (DILACOR XR) 120 MG 24 hr capsule Take 1 capsule (120 mg total) by mouth every 12 (twelve) hours. 60 capsule 6  . ELIQUIS 5 MG TABS tablet Take 5 mg by mouth 2 (two) times daily.  1    . fenofibrate 160 MG tablet Take 160 mg by mouth daily.    Marland Kitchen glimepiride (AMARYL) 1 MG tablet Take 1 mg by mouth daily.  0  . JENTADUETO 2.09-998 MG TABS Take 1 tablet by mouth 2 (two) times daily.  1  . lisinopril (PRINIVIL,ZESTRIL) 20 MG tablet Take 20 mg by mouth daily.  0  . metoprolol succinate (TOPROL-XL) 100 MG 24 hr tablet Take 100 mg by mouth daily.  1  . Multiple Vitamin (MULTIVITAMIN WITH MINERALS) TABS tablet Take 1 tablet by mouth daily.    . Omega-3 Fatty Acids (FISH OIL) 1200 MG CAPS Take 1,200 mg by mouth 3 (three) times daily.    Marland Kitchen VITAMIN E PO Take 1 capsule by mouth daily.     No current facility-administered medications for this visit.    LABS/IMAGING: No results found for this or any previous visit (from the past 48 hour(s)). No results found.  WEIGHTS: Wt Readings from Last 3 Encounters:  11/24/14 186 lb 14.4 oz (84.777 kg)  11/01/14 189 lb (85.73 kg)  09/29/14 189 lb 12.8 oz (86.093 kg)    VITALS: BP 138/72 mmHg  Pulse 92  Ht 5\' 11"  (1.803 m)  Wt 186 lb 14.4 oz (84.777 kg)  BMI 26.08 kg/m2  EXAM: Deferred  EKG: Age of fibrillation with controlled ventricular response at 80  ASSESSMENT: 1. New onset atrial fibrillation with controlled ventricular response, status post failed cardioversion attempt  2. Essential hypertension 3. Dyslipidemia 4. Diabetes type 2 5.   Family history of coronary disease 6.   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   Angel Shelton unfortunately failed 3 cardioversion attempts and never had any. His sinus rhythm. This could be related the fact that he was using a moderate amount of alcohol. He reports being abstinent now for about one month. As I outlined above, we may need to consider antiarrhythmics therapy if we are to reestablish sinus rhythm. Options could include flecainide or sotalol. We would need to exclude coronary artery disease first, therefore I'm recommending a Lexiscan nuclear stress test. If  that's negative then we could start him on antiarrhythmics therapy. He wishes to discuss further treatment with his family members and will be in contact with me after we had the results of his stress test.  Pixie Casino, MD, [facility] Attending Cardiologist [place] 11/24/2014, 11:13 AM

## 2014-12-06 ENCOUNTER — Telehealth (HOSPITAL_COMMUNITY): Payer: Self-pay

## 2014-12-06 ENCOUNTER — Ambulatory Visit: Payer: 59 | Admitting: Internal Medicine

## 2014-12-06 NOTE — Telephone Encounter (Signed)
Encounter complete. 

## 2014-12-08 ENCOUNTER — Ambulatory Visit (HOSPITAL_COMMUNITY)
Admission: RE | Admit: 2014-12-08 | Discharge: 2014-12-08 | Disposition: A | Discharge status: Home / Self Care | Payer: 59 | Source: Ambulatory Visit | Attending: Internal Medicine | Admitting: Internal Medicine

## 2014-12-08 DIAGNOSIS — R9439 Abnormal result of other cardiovascular function study: Secondary | ICD-10-CM | POA: Insufficient documentation

## 2014-12-08 DIAGNOSIS — I4891 Unspecified atrial fibrillation: Secondary | ICD-10-CM | POA: Diagnosis not present

## 2014-12-08 DIAGNOSIS — I4819 Other persistent atrial fibrillation: Secondary | ICD-10-CM

## 2014-12-08 DIAGNOSIS — I481 Persistent atrial fibrillation: Secondary | ICD-10-CM

## 2014-12-08 HISTORY — PX: NM MYOVIEW LTD: HXRAD82

## 2014-12-08 MED ORDER — TECHNETIUM TC 99M SESTAMIBI GENERIC - CARDIOLITE
10.4000 | Freq: Once | INTRAVENOUS | Status: AC | PRN
  Administered 2014-12-08: 10 via INTRAVENOUS

## 2014-12-08 MED ORDER — TECHNETIUM TC 99M SESTAMIBI GENERIC - CARDIOLITE
31.2000 | Freq: Once | INTRAVENOUS | Status: AC | PRN
  Administered 2014-12-08: 31.2 via INTRAVENOUS

## 2014-12-08 MED ORDER — REGADENOSON 0.4 MG/5ML IV SOLN
0.4000 mg | Freq: Once | INTRAVENOUS | Status: AC
  Administered 2014-12-08: 0.4 mg via INTRAVENOUS

## 2014-12-09 LAB — MYOCARDIAL PERFUSION IMAGING
CHL CUP NUCLEAR SDS: 2
CHL CUP NUCLEAR SRS: 4
CHL CUP NUCLEAR SSS: 6
CSEPPHR: 76 {beats}/min
NUC STRESS TID: 1.06
Rest HR: 67 {beats}/min

## 2014-12-19 ENCOUNTER — Encounter: Payer: Self-pay | Admitting: Internal Medicine

## 2014-12-19 ENCOUNTER — Ambulatory Visit (INDEPENDENT_AMBULATORY_CARE_PROVIDER_SITE_OTHER): Payer: 59 | Admitting: Internal Medicine

## 2014-12-19 ENCOUNTER — Telehealth: Payer: Self-pay | Admitting: Internal Medicine

## 2014-12-19 ENCOUNTER — Telehealth: Payer: Self-pay | Admitting: *Deleted

## 2014-12-19 VITALS — BP 134/83 | HR 93 | Ht 71.0 in | Wt 188.4 lb

## 2014-12-19 DIAGNOSIS — R931 Abnormal findings on diagnostic imaging of heart and coronary circulation: Secondary | ICD-10-CM

## 2014-12-19 DIAGNOSIS — I4819 Other persistent atrial fibrillation: Secondary | ICD-10-CM

## 2014-12-19 DIAGNOSIS — D689 Coagulation defect, unspecified: Secondary | ICD-10-CM

## 2014-12-19 DIAGNOSIS — R5383 Other fatigue: Secondary | ICD-10-CM | POA: Diagnosis not present

## 2014-12-19 DIAGNOSIS — R300 Dysuria: Secondary | ICD-10-CM

## 2014-12-19 DIAGNOSIS — R8299 Other abnormal findings in urine: Secondary | ICD-10-CM

## 2014-12-19 DIAGNOSIS — E119 Type 2 diabetes mellitus without complications: Secondary | ICD-10-CM

## 2014-12-19 DIAGNOSIS — R8290 Unspecified abnormal findings in urine: Secondary | ICD-10-CM

## 2014-12-19 DIAGNOSIS — R9439 Abnormal result of other cardiovascular function study: Secondary | ICD-10-CM

## 2014-12-19 DIAGNOSIS — Z79899 Other long term (current) drug therapy: Secondary | ICD-10-CM

## 2014-12-19 DIAGNOSIS — I10 Essential (primary) hypertension: Secondary | ICD-10-CM

## 2014-12-19 DIAGNOSIS — I4891 Unspecified atrial fibrillation: Secondary | ICD-10-CM

## 2014-12-19 DIAGNOSIS — I481 Persistent atrial fibrillation: Secondary | ICD-10-CM

## 2014-12-19 NOTE — Progress Notes (Signed)
OFFICE NOTE  Chief Complaint:  Follow-up stress test  Primary Care Physician: Marton Redwood, MD  HPI:  Angel Shelton is a pleasant 59 year old male who is currently referred to me by Dr. Brigitte Pulse. In the office during a physical exam which is routine, it was noted that he had an irregular heartbeat. An EKG was performed which demonstrated atrial fibrillation with rapid ventricular response. Angel Shelton tells me he had a physical exam 3 months prior and his heart was noted to be regular. Therefore he suspects that some point he went into atrial fibrillation without his knowledge, as he is completely asymptomatic with it. Today's heart rate is are elevated into the 130s. His primary care provider switched him from atenolol over to metoprolol at a higher dose of 100 mg daily. Spite this heart rate still remains uncontrolled. He was also started on Eliquis 5 mg twice daily which he has been taking regularly for about 1 week. He denies any chest pain or worsening shortness of breath. He is a former smoker but quit on 03/05/2014. His dad does have a history of heart disease in his late 76s. He's also a diabetic.  Angel Shelton returns today for follow-up. He underwent cardioversion unfortunately was unsuccessful after 3 attempts. There was no evidence of even a short period of normal sinus rhythm. After additional questioning it turns out that he does have a moderate amount of alcohol use and this likely contributes to his symptoms. Fortunately his TEE showed no evidence of thrombus and EF was preserved at 55%. I therefore increased his diltiazem for increased rate control and heart rate today is improved in the low 90s. He remains fairly asymptomatic. He's continued to take Eliquis without any significant bleeding difficulty. We discussed several options as far as going forward with rate or rhythm control. Given his age and new diagnosis of atrial fibrillation, I'm strongly in favor of rhythm control.  At this point though it seems that he may need antiarrhythmics medication. We discussed the importance of excluding coronary artery disease based on his family history of heart disease and I would recommend that he undergo a Lexiscan nuclear stress test prior to starting an antiarrhythmics medication. I would prefer option such as flecainide or sotalol for an antiarrhythmics. He does have several cardiology providers and his family and he wishes to discuss it with them further which is certainly fine with me.  I saw Angel Shelton back in the office today. He underwent nuclear stress testing to exclude is a possible cause for his atrial fibrillation any underlying coronary disease. Unfortunately the stress test was abnormal indicating an intermediate risk with some inferior reversible ischemia. He seems to be asymptomatic in fact his heart rate control is actually fairly good now although he remains in atrial fibrillation. It's unclear whether this represents true coronary artery disease or perhaps an artifact, however he does have multiple coronary risk factors including diabetes, hypertension and recent moderate alcohol use. He's done an excellent job per his report of being abstinent from alcohol which should help [place] in reestablishing sinus rhythm. At this point, however we cannot start him on antiarrhythmics therapy until either resolve the question as to whether or not he has any obstructive coronary disease. I'm recommending left heart catheterization. I did discuss the risks and benefits of this procedure at length today and answered any questions that he may have had. He did provide informed consent to go ahead with the procedure.  PMHx:  Past Medical  History  Diagnosis Date  . Arrhythmia   . Diabetes mellitus without complication   . Hyperlipidemia   . Hypertension     Past Surgical History  Procedure Laterality Date  . Tee without cardioversion N/A 11/01/2014    Procedure: TRANSESOPHAGEAL  ECHOCARDIOGRAM (TEE);  Surgeon: Pixie Casino, MD;  Location: [facility] ENDOSCOPY;  Service: Cardiovascular;  Laterality: N/A;  . Cardioversion N/A 11/01/2014    Procedure: CARDIOVERSION;  Surgeon: Pixie Casino, MD;  Location: [facility] ENDOSCOPY;  Service: Cardiovascular;  Laterality: N/A;    FAMHx:  Family History  Problem Relation Age of Onset  . Heart disease Father     SOCHx:   reports that he quit smoking about 9 months ago. He does not have any smokeless tobacco history on file. He reports that he drinks alcohol. He reports that he does not use illicit drugs.  ALLERGIES:  No Known Allergies  ROS: A comprehensive review of systems was negative.  HOME MEDS: Current Outpatient Prescriptions  Medication Sig Dispense Refill  . amitriptyline (ELAVIL) 25 MG tablet Take 25 mg by mouth at bedtime.    Marland Kitchen atorvastatin (LIPITOR) 20 MG tablet Take 20 mg by mouth daily.    . Cyanocobalamin (VITAMIN B-12 PO) Take 1 tablet by mouth daily.    Marland Kitchen diltiazem (DILACOR XR) 120 MG 24 hr capsule Take 1 capsule (120 mg total) by mouth every 12 (twelve) hours. 60 capsule 6  . ELIQUIS 5 MG TABS tablet Take 5 mg by mouth 2 (two) times daily.  1  . fenofibrate 160 MG tablet Take 160 mg by mouth daily.    Marland Kitchen glimepiride (AMARYL) 1 MG tablet Take 1 mg by mouth daily.  0  . JENTADUETO 2.09-998 MG TABS Take 1 tablet by mouth 2 (two) times daily.  1  . lisinopril (PRINIVIL,ZESTRIL) 20 MG tablet Take 20 mg by mouth daily.  0  . metoprolol succinate (TOPROL-XL) 100 MG 24 hr tablet Take 100 mg by mouth daily.  1  . Multiple Vitamin (MULTIVITAMIN WITH MINERALS) TABS tablet Take 1 tablet by mouth daily.    . Omega-3 Fatty Acids (FISH OIL) 1200 MG CAPS Take 1,200 mg by mouth 3 (three) times daily.    Marland Kitchen VITAMIN E PO Take 1 capsule by mouth daily.     No current facility-administered medications for this visit.    LABS/IMAGING: No results found for this or any previous visit (from the past 48 hour(s)). No results  found.  WEIGHTS: Wt Readings from Last 3 Encounters:  12/19/14 188 lb 6.4 oz (85.458 kg)  12/08/14 186 lb (84.369 kg)  11/24/14 186 lb 14.4 oz (84.777 kg)    VITALS: BP 134/83 mmHg  Pulse 93  Ht 5\' 11"  (1.803 m)  Wt 188 lb 6.4 oz (85.458 kg)  BMI 26.29 kg/m2  EXAM: Deferred  EKG: Age of fibrillation with controlled ventricular response at 56  ASSESSMENT: 1. New onset atrial fibrillation with controlled ventricular response, status post failed cardioversion attempt  2. Essential hypertension 3. Dyslipidemia 4. Diabetes type 2 5.   Family history of coronary disease 6.   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   Abnormal nuclear stress test demonstrating inferior ischemia  PLAN: 1.   Angel Shelton unfortunately failed 3 cardioversion attempts and never had any period of sinus rhythm. This could be related the fact that he was using a moderate amount of alcohol. He reports being abstinent now for several months. In addition I sent him for [facility]  nuclear stress test which was abnormal indicating reversible inferior ischemia. He does not seem to be symptomatic and denies any chest pain however this could complicate antiarrhythmics therapy and of course ischemia could be the precipitating factor for his atrial fibrillation. I'm recommending left heart catheterization to further understand his coronaries and as mentioned above discussed the risks and benefits of the procedure and he wishes to proceed. We'll likely schedule that in a couple of weeks. I'll be in contact with the results of that catheterization recommend further treatment and possible anti-rhythmic therapy pending the findings. He will need to hold his Eliquis for 2 days prior to the procedure.  Pixie Casino, MD, [facility] Attending Cardiologist [place] 12/19/2014, 12:48 PM

## 2014-12-19 NOTE — Patient Instructions (Addendum)
Your physician has requested that you have a cardiac catheterization @ [facility]. Cardiac catheterization is used to diagnose and/or treat various heart conditions. Doctors may recommend this procedure for a number of different reasons. The most common reason is to evaluate chest pain. Chest pain can be a symptom of coronary artery disease (CAD), and cardiac catheterization can show whether plaque is narrowing or blocking your heart's arteries. This procedure is also used to evaluate the valves, as well as measure the blood flow and oxygen levels in different parts of your heart. For further information please visit [email or web address]. Please follow instruction sheet, as given.  Following your catheterization, you will not be allowed to drive for 3 days.  No lifting, pushing, or pulling greater that 10 pounds is allowed for 1 week.  You will be required to have the following tests prior to the procedure:  1. Blood work - the blood work can be done no more than 7 days prior to the procedure.    >> It can be done at any [facility] lab. There is a lab downstairs on the first floor of this building in [address] and one at [address].  2. Chest X-ray - this can be done at [facility] in the [place] @ [address]   Please go to the lab on the first floor today and have a urine test done.

## 2014-12-19 NOTE — Telephone Encounter (Signed)
Patient notified. Voiced understanding.

## 2014-12-19 NOTE — Telephone Encounter (Signed)
-----   Message from Pixie Casino, MD sent at 12/19/2014 12:53 PM EDT ----- Reminder, Mr. Tesar will need to hold his Eliquis for 2 days prior to the catheterization.  Dr. Lemmie Evens

## 2014-12-19 NOTE — Telephone Encounter (Signed)
Orders placed today confirmed on the phone w/ [facility].

## 2014-12-20 LAB — URINALYSIS
Bilirubin Urine: NEGATIVE
Glucose, UA: 250 mg/dL — AB
Ketones, ur: NEGATIVE mg/dL
NITRITE: NEGATIVE
Protein, ur: 100 mg/dL — AB
Specific Gravity, Urine: 1.014 (ref 1.005–1.030)
Urobilinogen, UA: 0.2 mg/dL (ref 0.0–1.0)
pH: 5.5 (ref 5.0–8.0)

## 2014-12-21 LAB — URINE CULTURE: Colony Count: >=100000

## 2014-12-22 ENCOUNTER — Other Ambulatory Visit: Payer: Self-pay | Admitting: *Deleted

## 2014-12-22 MED ORDER — CIPROFLOXACIN HCL 250 MG PO TABS: ORAL_TABLET | ORAL | Status: DC

## 2014-12-22 NOTE — Telephone Encounter (Signed)
Patient notified of UA and urine culture results and voiced understanding of need for antibiotic treatment. Explained medication and dose instructions. He will call and let us know if his urinary symptoms do not resolve. Rx(s) sent to pharmacy electronically.

## 2014-12-27 ENCOUNTER — Telehealth: Payer: Self-pay | Admitting: Internal Medicine

## 2014-12-27 NOTE — Telephone Encounter (Signed)
Spoke with pt, he has received a letter from [employer] requesting more information to approve his upcoming cath. Spoke with billing, pt made aware pre-cert has already been completed and the auth #[id number].

## 2014-12-27 NOTE — Telephone Encounter (Signed)
Pt is calling in to speak with [place] about a letter he received from [place] about some information that needed. They needed the results from the echo and stress test. Please f/u with the pt  Thanks

## 2014-12-30 ENCOUNTER — Telehealth: Payer: Self-pay | Admitting: Internal Medicine

## 2014-12-30 NOTE — Telephone Encounter (Signed)
Mr.Sliney is calling because he received some antibiotics for the infection and it has started to come back this morning , Please call   Thanks

## 2014-12-30 NOTE — Telephone Encounter (Signed)
Patient called and said his urinary tract infections symptoms  They had improved but are now returning.  He had a RX for cipro 250 mg twice a day for 3 days called in by [employer] on 7/28.  His culture results showed that the organism was sensitive to Cipro.  Asked patient to follow up with his PCP but if his symptoms get worse before he gets into see his PCP to go to an Urgent care

## 2015-01-02 LAB — BASIC METABOLIC PANEL
BUN: 18 mg/dL (ref 7–25)
CO2: 23 mmol/L (ref 20–31)
Calcium: 10.4 mg/dL — ABNORMAL HIGH (ref 8.6–10.3)
Chloride: 98 mmol/L (ref 98–110)
Creat: 1.05 mg/dL (ref 0.70–1.25)
Glucose, Bld: 228 mg/dL — ABNORMAL HIGH (ref 65–99)
Potassium: 4.9 mmol/L (ref 3.5–5.3)
SODIUM: 133 mmol/L — AB (ref 135–146)

## 2015-01-02 LAB — TSH: TSH: 2.927 u[IU]/mL (ref 0.350–4.500)

## 2015-01-03 LAB — CBC
HCT: 41.3 % (ref 39.0–52.0)
Hemoglobin: 14.1 g/dL (ref 13.0–17.0)
MCH: 30.9 pg (ref 26.0–34.0)
MCHC: 34.1 g/dL (ref 30.0–36.0)
MCV: 90.4 fL (ref 78.0–100.0)
MPV: 11.9 fL (ref 8.6–12.4)
Platelets: 261 10*3/uL (ref 150–400)
RBC: 4.57 MIL/uL (ref 4.22–5.81)
RDW: 13 % (ref 11.5–15.5)
WBC: 9.4 10*3/uL (ref 4.0–10.5)

## 2015-01-03 LAB — PROTIME-INR
INR: 1.26 (ref <1.50)
PROTHROMBIN TIME: 15.8 s — AB (ref 11.6–15.2)

## 2015-01-03 LAB — APTT: aPTT: 28 seconds (ref 24–37)

## 2015-01-03 NOTE — Telephone Encounter (Signed)
Thanks .Marland Kitchen He may need an extended course of antibiotics. Will defer to Dr. Brigitte Pulse.  Dr. Lemmie Evens

## 2015-01-05 ENCOUNTER — Encounter (HOSPITAL_COMMUNITY): Payer: Self-pay | Admitting: Internal Medicine

## 2015-01-05 ENCOUNTER — Encounter (HOSPITAL_COMMUNITY)
Admission: RE | Disposition: A | Discharge status: Home / Self Care | Payer: Self-pay | Source: Ambulatory Visit | Attending: Internal Medicine

## 2015-01-05 ENCOUNTER — Ambulatory Visit (HOSPITAL_COMMUNITY)
Admission: RE | Admit: 2015-01-05 | Discharge: 2015-01-05 | Disposition: A | Discharge status: Home / Self Care | Payer: 59 | Source: Ambulatory Visit | Attending: Internal Medicine | Admitting: Internal Medicine

## 2015-01-05 DIAGNOSIS — R9439 Abnormal result of other cardiovascular function study: Secondary | ICD-10-CM | POA: Diagnosis present

## 2015-01-05 DIAGNOSIS — I2510 Atherosclerotic heart disease of native coronary artery without angina pectoris: Secondary | ICD-10-CM | POA: Insufficient documentation

## 2015-01-05 DIAGNOSIS — I4821 Permanent atrial fibrillation: Secondary | ICD-10-CM | POA: Diagnosis present

## 2015-01-05 DIAGNOSIS — I2584 Coronary atherosclerosis due to calcified coronary lesion: Secondary | ICD-10-CM | POA: Diagnosis not present

## 2015-01-05 DIAGNOSIS — I10 Essential (primary) hypertension: Secondary | ICD-10-CM | POA: Diagnosis present

## 2015-01-05 DIAGNOSIS — I481 Persistent atrial fibrillation: Secondary | ICD-10-CM | POA: Insufficient documentation

## 2015-01-05 DIAGNOSIS — I4819 Other persistent atrial fibrillation: Secondary | ICD-10-CM | POA: Diagnosis present

## 2015-01-05 DIAGNOSIS — R931 Abnormal findings on diagnostic imaging of heart and coronary circulation: Secondary | ICD-10-CM | POA: Diagnosis not present

## 2015-01-05 HISTORY — PX: CARDIAC CATHETERIZATION: SHX172

## 2015-01-05 LAB — GLUCOSE, CAPILLARY: GLUCOSE-CAPILLARY: 208 mg/dL — AB (ref 65–99)

## 2015-01-05 SURGERY — LEFT HEART CATH AND CORONARY ANGIOGRAPHY

## 2015-01-05 MED ORDER — MIDAZOLAM HCL 2 MG/2ML IJ SOLN
INTRAMUSCULAR | Status: AC
Start: 2015-01-05 — End: 2015-01-05
  Filled 2015-01-05: qty 4

## 2015-01-05 MED ORDER — OXYCODONE-ACETAMINOPHEN 5-325 MG PO TABS: 1.0000 | ORAL_TABLET | ORAL | Status: DC | PRN

## 2015-01-05 MED ORDER — SODIUM CHLORIDE 0.9 % IV SOLN: 250.0000 mL | INTRAVENOUS | Status: DC | PRN

## 2015-01-05 MED ORDER — SODIUM CHLORIDE 0.9 % IJ SOLN: 3.0000 mL | Freq: Two times a day (BID) | INTRAMUSCULAR | Status: DC

## 2015-01-05 MED ORDER — HEPARIN (PORCINE) IN NACL 2-0.9 UNIT/ML-% IJ SOLN
INTRAMUSCULAR | Status: AC
  Filled 2015-01-05: qty 1000

## 2015-01-05 MED ORDER — MIDAZOLAM HCL 2 MG/2ML IJ SOLN
INTRAMUSCULAR | Status: AC
  Filled 2015-01-05: qty 4

## 2015-01-05 MED ORDER — MIDAZOLAM HCL 2 MG/2ML IJ SOLN
INTRAMUSCULAR | Status: DC | PRN
Start: 2015-01-05 — End: 2015-01-05
  Administered 2015-01-05: 2 mg via INTRAVENOUS

## 2015-01-05 MED ORDER — SODIUM CHLORIDE 0.9 % IJ SOLN: 3.0000 mL | INTRAMUSCULAR | Status: DC | PRN

## 2015-01-05 MED ORDER — FENTANYL CITRATE (PF) 100 MCG/2ML IJ SOLN
INTRAMUSCULAR | Status: DC | PRN
  Administered 2015-01-05: 50 ug via INTRAVENOUS

## 2015-01-05 MED ORDER — SODIUM CHLORIDE 0.9 % IV SOLN
INTRAVENOUS | Status: DC
  Administered 2015-01-05: 09:00:00 via INTRAVENOUS

## 2015-01-05 MED ORDER — ASPIRIN 81 MG PO CHEW
81.0000 mg | CHEWABLE_TABLET | ORAL | Status: AC
  Administered 2015-01-05: 81 mg via ORAL

## 2015-01-05 MED ORDER — FENTANYL CITRATE (PF) 100 MCG/2ML IJ SOLN
INTRAMUSCULAR | Status: AC
  Filled 2015-01-05: qty 4

## 2015-01-05 MED ORDER — LIDOCAINE HCL (PF) 1 % IJ SOLN
INTRAMUSCULAR | Status: AC
  Filled 2015-01-05: qty 30

## 2015-01-05 MED ORDER — IOHEXOL 350 MG/ML SOLN
INTRAVENOUS | Status: DC | PRN
  Administered 2015-01-05: 70 mL via INTRACARDIAC

## 2015-01-05 MED ORDER — ASPIRIN 81 MG PO CHEW
CHEWABLE_TABLET | ORAL | Status: AC
  Administered 2015-01-05: 81 mg via ORAL
  Filled 2015-01-05: qty 1

## 2015-01-05 MED ORDER — FLECAINIDE ACETATE 50 MG PO TABS: 50.0000 mg | ORAL_TABLET | Freq: Two times a day (BID) | ORAL | Status: DC

## 2015-01-05 MED ORDER — ONDANSETRON HCL 4 MG/2ML IJ SOLN: 4.0000 mg | Freq: Four times a day (QID) | INTRAMUSCULAR | Status: DC | PRN

## 2015-01-05 MED ORDER — LIDOCAINE HCL (PF) 1 % IJ SOLN
INTRAMUSCULAR | Status: DC | PRN
  Administered 2015-01-05: 10:00:00

## 2015-01-05 MED ORDER — ACETAMINOPHEN 325 MG PO TABS
650.0000 mg | ORAL_TABLET | ORAL | Status: DC | PRN
Start: 2015-01-05 — End: 2015-01-05

## 2015-01-05 MED ORDER — SODIUM CHLORIDE 0.9 % WEIGHT BASED INFUSION: 3.0000 mL/kg/h | INTRAVENOUS | Status: AC

## 2015-01-05 SURGICAL SUPPLY — 9 items
CATH INFINITI 5FR MULTPACK ANG (CATHETERS) ×2 IMPLANT
KIT HEART LEFT (KITS) ×2 IMPLANT
NEEDLE SMART REG 18GX2-3/4 (NEEDLE) ×2 IMPLANT
PACK CARDIAC CATHETERIZATION (CUSTOM PROCEDURE TRAY) ×2 IMPLANT
SHEATH PINNACLE 5F 10CM (SHEATH) ×2 IMPLANT
SYR MEDRAD MARK V 150ML (SYRINGE) ×2 IMPLANT
TRANSDUCER W/STOPCOCK (MISCELLANEOUS) ×2 IMPLANT
TUBING CIL FLEX 10 FLL-RA (TUBING) ×2 IMPLANT
WIRE EMERALD 3MM-J .035X150CM (WIRE) ×2 IMPLANT

## 2015-01-05 NOTE — Discharge Instructions (Addendum)
RESTART ELIQUIS IN AM ON 01/07/15  START FLECAINIDE 50 MG EVERY 12 HOURS on 01/06/15 or when you get the prescription filled.   Arteriogram Care After These instructions give you information on caring for yourself after your procedure. Your doctor may also give you more specific instructions. Call your doctor if you have any problems or questions after your procedure. HOME CARE  Do not bathe, swim, or use a hot tub until directed by your doctor. You can shower.  Do not lift anything heavier than 10 pounds (about a gallon of milk) for 2 days.  Do not walk a lot, run, or drive for 2 days.  Return to normal activities in 2 days or as told by your doctor. Finding out the results of your test Ask when your test results will be ready. Make sure you get your test results. GET HELP RIGHT AWAY IF:   You have fever.  You have more pain in your leg.  The leg that was cut is:  Bleeding.  Puffy (swollen) or red.  Cold.  Pale or changes color.  Weak.  Tingly or numb. If you go to the Emergency Room, tell your nurse that you have had an arteriogram. Take this paper with you to show the nurse. MAKE SURE YOU:  Understand these instructions.  Will watch your condition.  Will get help right away if you are not doing well or get worse. Document Released: 08/09/2008 Document Revised: 05/18/2013 Document Reviewed: 08/09/2008 [facility] Patient Information 2015 [place], [place]. This information is not intended to replace advice given to you by your health care provider. Make sure you discuss any questions you have with your health care provider.       Coronary Angiogram A coronary angiogram, also called coronary angiography, is an X-ray procedure used to look at the arteries in the heart. In this procedure, a dye (contrast dye) is injected through a long, hollow tube (catheter). The catheter is about the size of a piece of cooked spaghetti and is inserted through your groin, wrist, or  arm. The dye is injected into each artery, and X-rays are then taken to show if there is a blockage in the arteries of your heart. LET [facility] CARE PROVIDER KNOW ABOUT:  Any allergies you have, including allergies to shellfish or contrast dye.   All medicines you are taking, including vitamins, herbs, eye drops, creams, and over-the-counter medicines.   Previous problems you or members of your family have had with the use of anesthetics.   Any blood disorders you have.   Previous surgeries you have had.  History of kidney problems or failure.   Other medical conditions you have. RISKS AND COMPLICATIONS  Generally, a coronary angiogram is a safe procedure. However, problems can occur and include:  Allergic reaction to the dye.  Bleeding from the access site or other locations.  Kidney injury, especially in people with impaired kidney function.  Stroke (rare).  Heart attack (rare). BEFORE THE PROCEDURE   Do not eat or drink anything after midnight the night before the procedure or as directed by your health care provider.   Ask your health care provider about changing or stopping your regular medicines. This is especially important if you are taking diabetes medicines or blood thinners. PROCEDURE  You may be given a medicine to help you relax (sedative) before the procedure. This medicine is given through an intravenous (IV) access tube that is inserted into one of your veins.   The area  where the catheter will be inserted will be washed and shaved. This is usually done in the groin but may be done in the fold of your arm (near your elbow) or in the wrist.   A medicine will be given to numb the area where the catheter will be inserted (local anesthetic).   The health care provider will insert the catheter into an artery. The catheter will be guided by using a special type of X-ray (fluoroscopy) of the blood vessel being examined.   A special dye will then be  injected into the catheter, and X-rays will be taken. The dye will help to show where any narrowing or blockages are located in the heart arteries.  AFTER THE PROCEDURE   If the procedure is done through the leg, you will be kept in bed lying flat for several hours. You will be instructed to not bend or cross your legs.  The insertion site will be checked frequently.   The pulse in your feet or wrist will be checked frequently.   Additional blood tests, X-rays, and an electrocardiogram may be done.  Document Released: 11/17/2002 Document Revised: 09/27/2013 Document Reviewed: 10/05/2012 [facility] Patient Information 2015 [place], [place]. This information is not intended to replace advice given to you by your health care provider. Make sure you discuss any questions you have with your health care provider.

## 2015-01-05 NOTE — H&P (Signed)
     INTERVAL PROCEDURE H&P  History and Physical Interval Note:  01/05/2015 8:09 AM  Angel Shelton has presented today for their planned procedure. The various methods of treatment have been discussed with the patient and family. After consideration of risks, benefits and other options for treatment, the patient has consented to the procedure.  The patients' outpatient history has been reviewed, patient examined, and no change in status from most recent office note within the past 30 days. I have reviewed the patients' chart and labs and will proceed as planned. Questions were answered to the patient's satisfaction.   Cath Lab Visit (complete for each Cath Lab visit)  Clinical Evaluation Leading to the Procedure:   ACS: No.  Non-ACS:    Anginal Classification: No Symptoms  Anti-ischemic medical therapy: Minimal Therapy (1 class of medications)  Non-Invasive Test Results: Intermediate-risk stress test findings: cardiac mortality 1-3%/year  Prior CABG: No previous CABG  Pixie Casino, MD, [facility] Attending Cardiologist [place] 01/05/2015, 8:09 AM

## 2015-01-09 ENCOUNTER — Ambulatory Visit (INDEPENDENT_AMBULATORY_CARE_PROVIDER_SITE_OTHER): Payer: 59 | Admitting: *Deleted

## 2015-01-09 DIAGNOSIS — I4819 Other persistent atrial fibrillation: Secondary | ICD-10-CM

## 2015-01-09 DIAGNOSIS — I481 Persistent atrial fibrillation: Secondary | ICD-10-CM

## 2015-01-09 DIAGNOSIS — Z5181 Encounter for therapeutic drug level monitoring: Secondary | ICD-10-CM

## 2015-01-09 DIAGNOSIS — Z79899 Other long term (current) drug therapy: Secondary | ICD-10-CM

## 2015-01-09 NOTE — Progress Notes (Signed)
Patient presented to EKG check after starting flecainide 50mg  BID on 01/05/15. EKG reviewed by Dr. Sallyanne Kuster and was released without med changes.

## 2015-02-14 ENCOUNTER — Ambulatory Visit (INDEPENDENT_AMBULATORY_CARE_PROVIDER_SITE_OTHER): Payer: 59 | Admitting: Internal Medicine

## 2015-02-14 ENCOUNTER — Encounter: Payer: Self-pay | Admitting: Internal Medicine

## 2015-02-14 VITALS — BP 128/78 | HR 82 | Ht 71.0 in | Wt 192.0 lb

## 2015-02-14 DIAGNOSIS — D689 Coagulation defect, unspecified: Secondary | ICD-10-CM

## 2015-02-14 DIAGNOSIS — I10 Essential (primary) hypertension: Secondary | ICD-10-CM

## 2015-02-14 DIAGNOSIS — R5383 Other fatigue: Secondary | ICD-10-CM | POA: Diagnosis not present

## 2015-02-14 DIAGNOSIS — Z79899 Other long term (current) drug therapy: Secondary | ICD-10-CM

## 2015-02-14 DIAGNOSIS — I4891 Unspecified atrial fibrillation: Secondary | ICD-10-CM | POA: Diagnosis not present

## 2015-02-14 DIAGNOSIS — I4819 Other persistent atrial fibrillation: Secondary | ICD-10-CM

## 2015-02-14 DIAGNOSIS — E785 Hyperlipidemia, unspecified: Secondary | ICD-10-CM

## 2015-02-14 DIAGNOSIS — I481 Persistent atrial fibrillation: Secondary | ICD-10-CM | POA: Diagnosis not present

## 2015-02-14 DIAGNOSIS — Z01812 Encounter for preprocedural laboratory examination: Secondary | ICD-10-CM

## 2015-02-14 NOTE — Patient Instructions (Signed)
Dr. Debara Pickett has ordered a cardioversion to be done at [facility] with Dr. Debara Pickett NEXT WEEK.   Please have lab work 3-5 days prior to your procedure date.

## 2015-02-14 NOTE — H&P (Signed)
OFFICE NOTE  Chief Complaint:  Follow-up catheterization  Primary Care Physician: Marton Redwood, MD  HPI:  Angel Shelton is a pleasant 59 year old male who is currently referred to me by Dr. Brigitte Pulse. In the office during a physical exam which is routine, it was noted that he had an irregular heartbeat. An EKG was performed which demonstrated atrial fibrillation with rapid ventricular response. Angel Shelton tells me he had a physical exam 3 months prior and his heart was noted to be regular. Therefore he suspects that some point he went into atrial fibrillation without his knowledge, as he is completely asymptomatic with it. Today's heart rate is are elevated into the 130s. His primary care provider switched him from atenolol over to metoprolol at a higher dose of 100 mg daily. Spite this heart rate still remains uncontrolled. He was also started on Eliquis 5 mg twice daily which he has been taking regularly for about 1 week. He denies any chest pain or worsening shortness of breath. He is a former smoker but quit on 03/05/2014. His dad does have a history of heart disease in his late 31s. He's also a diabetic.  Angel Shelton returns today for follow-up. He underwent cardioversion unfortunately was unsuccessful after 3 attempts. There was no evidence of even a short period of normal sinus rhythm. After additional questioning it turns out that he does have a moderate amount of alcohol use and this likely contributes to his symptoms. Fortunately his TEE showed no evidence of thrombus and EF was preserved at 55%. I therefore increased his diltiazem for increased rate control and heart rate today is improved in the low 90s. He remains fairly asymptomatic. He's continued to take Eliquis without any significant bleeding difficulty. We discussed several options as far as going forward with rate or rhythm control. Given his age and new diagnosis of atrial fibrillation, I'm strongly in favor of rhythm  control. At this point though it seems that he may need antiarrhythmics medication. We discussed the importance of excluding coronary artery disease based on his family history of heart disease and I would recommend that he undergo a Lexiscan nuclear stress test prior to starting an antiarrhythmics medication. I would prefer option such as flecainide or sotalol for an antiarrhythmics. He does have several cardiology providers and his family and he wishes to discuss it with them further which is certainly fine with me.  I saw Angel Shelton back in the office today. He underwent nuclear stress testing to exclude is a possible cause for his atrial fibrillation any underlying coronary disease. Unfortunately the stress test was abnormal indicating an intermediate risk with some inferior reversible ischemia. He seems to be asymptomatic in fact his heart rate control is actually fairly good now although he remains in atrial fibrillation. It's unclear whether this represents true coronary artery disease or perhaps an artifact, however he does have multiple coronary risk factors including diabetes, hypertension and recent moderate alcohol use. He's done an excellent job per his report of being abstinent from alcohol which should help [place] in reestablishing sinus rhythm. At this point, however we cannot start him on antiarrhythmics therapy until either resolve the question as to whether or not he has any obstructive coronary disease. I'm recommending left heart catheterization. I did discuss the risks and benefits of this procedure at length today and answered any questions that he may have had. He did provide informed consent to go ahead with the procedure.  Angel Shelton returns today for  follow-up. His cardiac catheterization revealed no obstructive coronary disease and therefore the nuclear stress test was a false positive. Subsequent to that I started him on Flecainide at 50 mg twice a day. He's been taking that without  any difficulty. EKG today shows he still in atrial fibrillation. It's now been one month after restarting Eliquis and we could consider an elective cardioversion.  PMHx:  Past Medical History  Diagnosis Date  . Arrhythmia   . Diabetes mellitus without complication   . Hyperlipidemia   . Hypertension     Past Surgical History  Procedure Laterality Date  . Tee without cardioversion N/A 11/01/2014    Procedure: TRANSESOPHAGEAL ECHOCARDIOGRAM (TEE);  Surgeon: Pixie Casino, MD;  Location: [place];  Service: Cardiovascular;  Laterality: N/A;  . Cardioversion N/A 11/01/2014    Procedure: CARDIOVERSION;  Surgeon: Pixie Casino, MD;  Location: [facility] ENDOSCOPY;  Service: Cardiovascular;  Laterality: N/A;  . Cardiac catheterization N/A 01/05/2015    Procedure: Left Heart Cath and Coronary Angiography;  Surgeon: Pixie Casino, MD;  Location: [place] CV LAB;  Service: Cardiovascular;  Laterality: N/A;    FAMHx:  Family History  Problem Relation Age of Onset  . Heart disease Father     SOCHx:   reports that he quit smoking about a year ago. He does not have any smokeless tobacco history on file. He reports that he drinks alcohol. He reports that he does not use illicit drugs.  ALLERGIES:  No Known Allergies  ROS: A comprehensive review of systems was negative.  HOME MEDS: Current Outpatient Prescriptions  Medication Sig Dispense Refill  . atorvastatin (LIPITOR) 20 MG tablet Take 20 mg by mouth daily.    . Cyanocobalamin (VITAMIN B-12 PO) Take 1 tablet by mouth daily.    Marland Kitchen diltiazem (DILACOR XR) 120 MG 24 hr capsule Take 1 capsule (120 mg total) by mouth every 12 (twelve) hours. 60 capsule 6  . ELIQUIS 5 MG TABS tablet Take 5 mg by mouth 2 (two) times daily.  1  . fenofibrate 160 MG tablet Take 160 mg by mouth daily.    . flecainide (TAMBOCOR) 50 MG tablet Take 1 tablet (50 mg total) by mouth 2 (two) times daily. 60 tablet 5  . glimepiride (AMARYL) 1 MG tablet Take 1 mg by mouth  daily.  0  . JENTADUETO 2.09-998 MG TABS Take 1 tablet by mouth 2 (two) times daily.  1  . lisinopril (PRINIVIL,ZESTRIL) 20 MG tablet Take 20 mg by mouth daily.  0  . metoprolol succinate (TOPROL-XL) 100 MG 24 hr tablet Take 100 mg by mouth daily.  1  . Multiple Vitamin (MULTIVITAMIN WITH MINERALS) TABS tablet Take 1 tablet by mouth daily.    . Omega-3 Fatty Acids (FISH OIL) 1200 MG CAPS Take 1,200 mg by mouth 3 (three) times daily.    Marland Kitchen VITAMIN E PO Take 1 capsule by mouth daily.     No current facility-administered medications for this visit.    LABS/IMAGING: No results found for this or any previous visit (from the past 48 hour(s)). No results found.  WEIGHTS: Wt Readings from Last 3 Encounters:  02/14/15 192 lb (87.091 kg)  01/05/15 188 lb (85.276 kg)  12/19/14 188 lb 6.4 oz (85.458 kg)    VITALS: BP 128/78 mmHg  Pulse 82  Ht 5\' 11"  (1.803 m)  Wt 192 lb (87.091 kg)  BMI 26.79 kg/m2  EXAM: Deferred  EKG: Atrial fibrillation with controlled ventricular response at 82  ASSESSMENT: 1. New onset atrial fibrillation with controlled ventricular response, status post failed cardioversion attempt  2. Essential hypertension 3. Dyslipidemia 4. Diabetes type 2 5.   Family history of coronary disease 6.   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   Abnormal nuclear stress test - no obstructive coronary disease by catheterization  PLAN: 1.   Mr. Karbowski unfortunately failed 3 cardioversion attempts and never had any period of sinus rhythm. Subsequently he was asked to abstain from alcohol which is done a good job of doing. He underwent nuclear stress testing which showed an area of inferior ischemia. He then underwent recatheterization which showed no obstruction. As there was no evidence of ischemia, I then felt comfortable putting him on flecainide 50 mg twice a day. He's been taking that for a month as well as restarting his Eliquis. At this point he is adequately  anticoagulated. He remains in atrial fibrillation and I would recommend a repeat cardioversion attempt. We discussed risks and benefits of this procedure today and he is willing to proceed.  Pixie Casino, MD, [facility] Attending Cardiologist [place] 02/14/2015, 10:50 AM

## 2015-02-15 ENCOUNTER — Other Ambulatory Visit: Payer: Self-pay | Admitting: *Deleted

## 2015-02-15 DIAGNOSIS — I4819 Other persistent atrial fibrillation: Secondary | ICD-10-CM

## 2015-02-17 LAB — CBC
HCT: 43.6 % (ref 39.0–52.0)
Hemoglobin: 15 g/dL (ref 13.0–17.0)
MCH: 30.7 pg (ref 26.0–34.0)
MCHC: 34.4 g/dL (ref 30.0–36.0)
MCV: 89.2 fL (ref 78.0–100.0)
MPV: 11.5 fL (ref 8.6–12.4)
Platelets: 252 10*3/uL (ref 150–400)
RBC: 4.89 MIL/uL (ref 4.22–5.81)
RDW: 13.5 % (ref 11.5–15.5)
WBC: 7.8 10*3/uL (ref 4.0–10.5)

## 2015-02-18 LAB — APTT: aPTT: 30 seconds (ref 24–37)

## 2015-02-18 LAB — TSH: TSH: 3.485 u[IU]/mL (ref 0.350–4.500)

## 2015-02-18 LAB — BASIC METABOLIC PANEL
BUN: 17 mg/dL (ref 7–25)
CALCIUM: 10.3 mg/dL (ref 8.6–10.3)
CHLORIDE: 98 mmol/L (ref 98–110)
CO2: 24 mmol/L (ref 20–31)
Creat: 1.01 mg/dL (ref 0.70–1.25)
Glucose, Bld: 254 mg/dL — ABNORMAL HIGH (ref 65–99)
Potassium: 4.9 mmol/L (ref 3.5–5.3)
Sodium: 132 mmol/L — ABNORMAL LOW (ref 135–146)

## 2015-02-18 LAB — PROTIME-INR
INR: 1.17 (ref <1.50)
Prothrombin Time: 15.1 seconds (ref 11.6–15.2)

## 2015-02-23 MED ORDER — SODIUM CHLORIDE 0.9 % IV SOLN
INTRAVENOUS | Status: DC
  Administered 2015-02-24: 500 mL via INTRAVENOUS

## 2015-02-24 ENCOUNTER — Ambulatory Visit (HOSPITAL_COMMUNITY): Payer: 59 | Admitting: Anesthesiology

## 2015-02-24 ENCOUNTER — Encounter (HOSPITAL_COMMUNITY): Payer: Self-pay | Admitting: Internal Medicine

## 2015-02-24 ENCOUNTER — Ambulatory Visit (HOSPITAL_COMMUNITY)
Admission: RE | Admit: 2015-02-24 | Discharge: 2015-02-24 | Disposition: A | Discharge status: Home / Self Care | Payer: 59 | Source: Ambulatory Visit | Attending: Internal Medicine | Admitting: Internal Medicine

## 2015-02-24 ENCOUNTER — Encounter (HOSPITAL_COMMUNITY)
Admission: RE | Disposition: A | Discharge status: Home / Self Care | Payer: Self-pay | Source: Ambulatory Visit | Attending: Internal Medicine

## 2015-02-24 DIAGNOSIS — Z87891 Personal history of nicotine dependence: Secondary | ICD-10-CM | POA: Diagnosis not present

## 2015-02-24 DIAGNOSIS — I10 Essential (primary) hypertension: Secondary | ICD-10-CM | POA: Diagnosis not present

## 2015-02-24 DIAGNOSIS — I481 Persistent atrial fibrillation: Secondary | ICD-10-CM

## 2015-02-24 DIAGNOSIS — E119 Type 2 diabetes mellitus without complications: Secondary | ICD-10-CM | POA: Diagnosis not present

## 2015-02-24 DIAGNOSIS — I4821 Permanent atrial fibrillation: Secondary | ICD-10-CM | POA: Diagnosis present

## 2015-02-24 DIAGNOSIS — Z0189 Encounter for other specified special examinations: Secondary | ICD-10-CM

## 2015-02-24 DIAGNOSIS — I4891 Unspecified atrial fibrillation: Secondary | ICD-10-CM | POA: Diagnosis not present

## 2015-02-24 DIAGNOSIS — I4819 Other persistent atrial fibrillation: Secondary | ICD-10-CM

## 2015-02-24 HISTORY — PX: CARDIOVERSION: SHX1299

## 2015-02-24 LAB — GLUCOSE, CAPILLARY: GLUCOSE-CAPILLARY: 220 mg/dL — AB (ref 65–99)

## 2015-02-24 SURGERY — CARDIOVERSION
Anesthesia: General

## 2015-02-24 MED ORDER — SODIUM CHLORIDE 0.9 % IJ SOLN: 3.0000 mL | INTRAMUSCULAR | Status: DC | PRN

## 2015-02-24 MED ORDER — DILTIAZEM HCL ER 120 MG PO CP24: 120.0000 mg | ORAL_CAPSULE | Freq: Every day | ORAL | Status: DC

## 2015-02-24 MED ORDER — LIDOCAINE HCL (CARDIAC) 20 MG/ML IV SOLN
INTRAVENOUS | Status: DC | PRN
  Administered 2015-02-24: 40 mg via INTRAVENOUS

## 2015-02-24 MED ORDER — PROPOFOL 10 MG/ML IV BOLUS
INTRAVENOUS | Status: DC | PRN
  Administered 2015-02-24: 70 mg via INTRAVENOUS

## 2015-02-24 NOTE — H&P (Signed)
     INTERVAL PROCEDURE H&P  History and Physical Interval Note:  02/24/2015 11:53 AM  Angel Shelton has presented today for their planned procedure. The various methods of treatment have been discussed with the patient and family. After consideration of risks, benefits and other options for treatment, the patient has consented to the procedure.  The patients' outpatient history has been reviewed, patient examined, and no change in status from most recent office note within the past 30 days. I have reviewed the patients' chart and labs and will proceed as planned. Questions were answered to the patient's satisfaction.   Pixie Casino, MD, [facility] Attending Cardiologist [place] 02/24/2015, 11:53 AM

## 2015-02-24 NOTE — Discharge Instructions (Signed)
°  Decrease diltiazem to 120 mg daily (instead of twice daily) - continue all other current medications.   Electrical Cardioversion, Care After Refer to this sheet in the next few weeks. These instructions provide you with information on caring for yourself after your procedure. Your health care provider may also give you more specific instructions. Your treatment has been planned according to current medical practices, but problems sometimes occur. Call your health care provider if you have any problems or questions after your procedure. WHAT TO EXPECT AFTER THE PROCEDURE After your procedure, it is typical to have the following sensations:  Some redness on the skin where the shocks were delivered. If this is tender, a sunburn lotion or hydrocortisone cream may help.  Possible return of an abnormal heart rhythm within hours or days after the procedure. HOME CARE INSTRUCTIONS  Take medicines only as directed by your health care provider. Be sure you understand how and when to take your medicine.  Learn how to feel your pulse and check it often.  Limit your activity for 48 hours after the procedure or as directed by your health care provider.  Avoid or minimize caffeine and other stimulants as directed by your health care provider. SEEK MEDICAL CARE IF:  You feel like your heart is beating too fast or your pulse is not regular.  You have any questions about your medicines.  You have bleeding that will not stop. SEEK IMMEDIATE MEDICAL CARE IF:  You are dizzy or feel faint.  It is hard to breathe or you feel short of breath.  There is a change in discomfort in your chest.  Your speech is slurred or you have trouble moving an arm or leg on one side of your body.  You get a serious muscle cramp that does not go away.  Your fingers or toes turn cold or blue. Document Released: 03/03/2013 Document Revised: 09/27/2013 Document Reviewed: 03/03/2013 [facility] Patient Information 2015  [place], [place]. This information is not intended to replace advice given to you by your health care provider. Make sure you discuss any questions you have with your health care provider.

## 2015-02-24 NOTE — CV Procedure (Signed)
    CARDIOVERSION NOTE  Procedure: Electrical Cardioversion Indications:  Atrial Fibrillation  Procedure Details:  Consent: Risks of procedure as well as the alternatives and risks of each were explained to the (patient/caregiver).  Consent for procedure obtained.  Time Out: Verified patient identification, verified procedure, site/side was marked, verified correct patient position, special equipment/implants available, medications/allergies/relevent history reviewed, required imaging and test results available.  Performed  Patient placed on cardiac monitor, pulse oximetry, supplemental oxygen as necessary.  Sedation given: Propofol per anesthesia Pacer pads placed anterior and posterior chest.  Cardioverted 2 time(s).  Cardioverted at 120J and 150J biphasic  Impression: Findings: Post procedure EKG shows: NSR with PVC's Complications: None Patient did tolerate procedure well.  Plan: 1. Successful DCCV to NSR with PVC's. 2. Decrease diltiazem XR to 120 mg daily (instead of twice daily) 3. Continue current metoprolol, flecainide and Eliquis 4. Will contact him for a follow-up appointment in 3-4 weeks.  Time Spent Directly with the Patient:  30 minutes  Pixie Casino, MD, [facility] Attending Cardiologist [place] 02/24/2015, 1:21 PM

## 2015-02-24 NOTE — Transfer of Care (Signed)
Immediate Anesthesia Transfer of Care Note  Patient: Angel Shelton  Procedure(s) Performed: Procedure(s): CARDIOVERSION (N/A)  Patient Location: PACU and Endoscopy Unit  Anesthesia Type:General  Level of Consciousness: awake, alert , oriented and patient cooperative  Airway & Oxygen Therapy: Patient Spontanous Breathing and Patient connected to nasal cannula oxygen  Post-op Assessment: Report given to RN and Post -op Vital signs reviewed and stable  Post vital signs: Reviewed and stable  Last Vitals:  Filed Vitals:   02/24/15 1205  BP: 122/65  Pulse: 75  Temp: 36.7 C  Resp: 8    Complications: No apparent anesthesia complications

## 2015-02-24 NOTE — Anesthesia Preprocedure Evaluation (Addendum)
Anesthesia Evaluation  Patient identified by MRN, date of birth, ID band Patient awake    Reviewed: Allergy & Precautions, NPO status , Patient's Chart, lab work & pertinent test results  Airway Mallampati: II  TM Distance: >3 FB Neck ROM: Full    Dental  (+) Teeth Intact   Pulmonary former smoker,  breath sounds clear to auscultation        Cardiovascular hypertension, Rhythm:Irregular Rate:Normal     Neuro/Psych    GI/Hepatic   Endo/Other  diabetes  Renal/GU      Musculoskeletal   Abdominal   Peds  Hematology   Anesthesia Other Findings   Reproductive/Obstetrics                            Anesthesia Physical Anesthesia Plan  ASA: III  Anesthesia Plan: General   Post-op Pain Management:    Induction: Intravenous  Airway Management Planned: Mask  Additional Equipment:   Intra-op Plan:   Post-operative Plan:   Informed Consent: I have reviewed the patients History and Physical, chart, labs and discussed the procedure including the risks, benefits and alternatives for the proposed anesthesia with the patient or authorized representative who has indicated his/her understanding and acceptance.     Plan Discussed with: CRNA and Anesthesiologist  Anesthesia Plan Comments:         Anesthesia Quick Evaluation  

## 2015-02-24 NOTE — Anesthesia Postprocedure Evaluation (Signed)
  Anesthesia Post-op Note  Patient: Angel Shelton  Procedure(s) Performed: Procedure(s): CARDIOVERSION (N/A)  Patient Location: Endoscopy unit  Anesthesia Type:General  Level of Consciousness: awake, alert  and oriented  Airway and Oxygen Therapy: Patient Spontanous Breathing  Post-op Pain: none  Post-op Assessment: Post-op Vital signs reviewed, Patient's Cardiovascular Status Stable, Respiratory Function Stable and Patent Airway              Post-op Vital Signs: stable  Last Vitals:  Filed Vitals:   02/24/15 1330  BP: 100/66  Pulse: 66  Temp:   Resp: 19    Complications: No apparent anesthesia complications

## 2015-02-27 ENCOUNTER — Telehealth: Payer: Self-pay | Admitting: Internal Medicine

## 2015-03-01 NOTE — Telephone Encounter (Signed)
Close encounter 

## 2015-03-23 ENCOUNTER — Encounter: Payer: Self-pay | Admitting: Internal Medicine

## 2015-03-23 ENCOUNTER — Ambulatory Visit (INDEPENDENT_AMBULATORY_CARE_PROVIDER_SITE_OTHER): Payer: 59 | Admitting: Internal Medicine

## 2015-03-23 VITALS — BP 124/68 | HR 83 | Ht 71.0 in | Wt 196.6 lb

## 2015-03-23 DIAGNOSIS — I481 Persistent atrial fibrillation: Secondary | ICD-10-CM | POA: Diagnosis not present

## 2015-03-23 DIAGNOSIS — I4891 Unspecified atrial fibrillation: Secondary | ICD-10-CM | POA: Diagnosis not present

## 2015-03-23 DIAGNOSIS — I4819 Other persistent atrial fibrillation: Secondary | ICD-10-CM

## 2015-03-23 DIAGNOSIS — I10 Essential (primary) hypertension: Secondary | ICD-10-CM | POA: Diagnosis not present

## 2015-03-23 NOTE — Patient Instructions (Signed)
You have been referred to [name] @ [facility] wants you to follow-up in: 6 months with Dr. Debara Pickett. You will receive a reminder letter in the mail two months in advance. If you don't receive a letter, please call our office to schedule the follow-up appointment.

## 2015-03-23 NOTE — Progress Notes (Signed)
OFFICE NOTE  Chief Complaint:  Follow-up cardioversion  Primary Care Physician: Marton Redwood, MD  HPI:  Angel Shelton is a pleasant 59 year old male who is currently referred to me by Dr. Brigitte Pulse. In the office during a physical exam which is routine, it was noted that he had an irregular heartbeat. An EKG was performed which demonstrated atrial fibrillation with rapid ventricular response. Angel Shelton tells me he had a physical exam 3 months prior and his heart was noted to be regular. Therefore he suspects that some point he went into atrial fibrillation without his knowledge, as he is completely asymptomatic with it. Today's heart rate is are elevated into the 130s. His primary care provider switched him from atenolol over to metoprolol at a higher dose of 100 mg daily. Spite this heart rate still remains uncontrolled. He was also started on Eliquis 5 mg twice daily which he has been taking regularly for about 1 week. He denies any chest pain or worsening shortness of breath. He is a former smoker but quit on 03/05/2014. His dad does have a history of heart disease in his late 53s. He's also a diabetic.  Angel Shelton returns today for follow-up. He underwent cardioversion unfortunately was unsuccessful after 3 attempts. There was no evidence of even a short period of normal sinus rhythm. After additional questioning it turns out that he does have a moderate amount of alcohol use and this likely contributes to his symptoms. Fortunately his TEE showed no evidence of thrombus and EF was preserved at 55%. I therefore increased his diltiazem for increased rate control and heart rate today is improved in the low 90s. He remains fairly asymptomatic. He's continued to take Eliquis without any significant bleeding difficulty. We discussed several options as far as going forward with rate or rhythm control. Given his age and new diagnosis of atrial fibrillation, I'm strongly in favor of rhythm control.  At this point though it seems that he may need antiarrhythmics medication. We discussed the importance of excluding coronary artery disease based on his family history of heart disease and I would recommend that he undergo a Lexiscan nuclear stress test prior to starting an antiarrhythmics medication. I would prefer option such as flecainide or sotalol for an antiarrhythmics. He does have several cardiology providers and his family and he wishes to discuss it with them further which is certainly fine with me.  I saw Angel Shelton back in the office today. He underwent nuclear stress testing to exclude is a possible cause for his atrial fibrillation any underlying coronary disease. Unfortunately the stress test was abnormal indicating an intermediate risk with some inferior reversible ischemia. He seems to be asymptomatic in fact his heart rate control is actually fairly good now although he remains in atrial fibrillation. It's unclear whether this represents true coronary artery disease or perhaps an artifact, however he does have multiple coronary risk factors including diabetes, hypertension and recent moderate alcohol use. He's done an excellent job per his report of being abstinent from alcohol which should help [place] in reestablishing sinus rhythm. At this point, however we cannot start him on antiarrhythmics therapy until either resolve the question as to whether or not he has any obstructive coronary disease. I'm recommending left heart catheterization. I did discuss the risks and benefits of this procedure at length today and answered any questions that he may have had. He did provide informed consent to go ahead with the procedure.  Angel Shelton returns today for  follow-up. Unfortunately his EKG shows atrial fibrillation. He says he was probably in sinus rhythm for about 3 or 4 days but has reverted back to A. Fib. When queried about how he feels he says he may have felt a little better after cardioversion,  but he's not sure if it is psychologic or not. He's also gained some weight and not exercising as much so he attributes some of his fatigue to that. Certainly his fatigue could be related to atrial fibrillation. We discussed several options today going forward including considering increasing his dose of flecainide, changing to an alternative antiarrhythmic such as dofetilide, or possibly referral for EP evaluation for catheter ablation.   PMHx:  Past Medical History  Diagnosis Date  . Arrhythmia   . Diabetes mellitus without complication ([place])   . Hyperlipidemia   . Hypertension     Past Surgical History  Procedure Laterality Date  . Tee without cardioversion N/A 11/01/2014    Procedure: TRANSESOPHAGEAL ECHOCARDIOGRAM (TEE);  Surgeon: Pixie Casino, MD;  Location: [place];  Service: Cardiovascular;  Laterality: N/A;  . Cardioversion N/A 11/01/2014    Procedure: CARDIOVERSION;  Surgeon: Pixie Casino, MD;  Location: [facility] ENDOSCOPY;  Service: Cardiovascular;  Laterality: N/A;  . Cardiac catheterization N/A 01/05/2015    Procedure: Left Heart Cath and Coronary Angiography;  Surgeon: Pixie Casino, MD;  Location: [place] CV LAB;  Service: Cardiovascular;  Laterality: N/A;  . Cardioversion N/A 02/24/2015    Procedure: CARDIOVERSION;  Surgeon: Pixie Casino, MD;  Location: [facility] ENDOSCOPY;  Service: Cardiovascular;  Laterality: N/A;    FAMHx:  Family History  Problem Relation Age of Onset  . Heart disease Father     SOCHx:   reports that he quit smoking about 12 months ago. He does not have any smokeless tobacco history on file. He reports that he drinks alcohol. He reports that he does not use illicit drugs.  ALLERGIES:  No Known Allergies  ROS: A comprehensive review of systems was negative except for: Constitutional: positive for fatigue  HOME MEDS: Current Outpatient Prescriptions  Medication Sig Dispense Refill  . atorvastatin (LIPITOR) 20 MG tablet Take 20 mg by mouth  daily.    . Cyanocobalamin (VITAMIN B-12 PO) Take 1 tablet by mouth daily.    Marland Kitchen diltiazem (DILACOR XR) 120 MG 24 hr capsule Take 1 capsule (120 mg total) by mouth daily. 60 capsule 6  . ELIQUIS 5 MG TABS tablet Take 5 mg by mouth 2 (two) times daily.  1  . fenofibrate 160 MG tablet Take 160 mg by mouth daily.    . flecainide (TAMBOCOR) 50 MG tablet Take 1 tablet (50 mg total) by mouth 2 (two) times daily. 60 tablet 5  . glimepiride (AMARYL) 4 MG tablet Take 4 mg by mouth daily with breakfast.    . JENTADUETO 2.09-998 MG TABS Take 1 tablet by mouth 2 (two) times daily.  1  . lisinopril (PRINIVIL,ZESTRIL) 20 MG tablet Take 20 mg by mouth daily.  0  . metoprolol succinate (TOPROL-XL) 100 MG 24 hr tablet Take 100 mg by mouth daily.  1  . Multiple Vitamin (MULTIVITAMIN WITH MINERALS) TABS tablet Take 1 tablet by mouth daily.    . Omega-3 Fatty Acids (FISH OIL) 1200 MG CAPS Take 1,200 mg by mouth 3 (three) times daily.    Marland Kitchen VITAMIN E PO Take 1 capsule by mouth daily.     No current facility-administered medications for this visit.    LABS/IMAGING: No  results found for this or any previous visit (from the past 85 hour(s)). No results found.  WEIGHTS: Wt Readings from Last 3 Encounters:  03/23/15 196 lb 9.6 oz (89.177 kg)  02/24/15 192 lb (87.091 kg)  02/14/15 192 lb (87.091 kg)    VITALS: BP 124/68 mmHg  Pulse 83  Ht 5\' 11"  (1.803 m)  Wt 196 lb 9.6 oz (89.177 kg)  BMI 27.43 kg/m2  EXAM: Deferred  EKG: Atrial fibrillation with controlled ventricular response at 59  ASSESSMENT: 1. Recurrent atrial fibrillation despite cardioversion and flecainide therapy, CHADSVASC 3 2. Essential hypertension 3. Dyslipidemia 4. Diabetes type 2 5.   Family history of coronary disease 6.   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   Abnormal nuclear stress test demonstrating inferior ischemia  PLAN: 1.   Mr. Mabee has twice failed cardioversion and recently antiarrhythmic therapy  with flecainide. He has been abstinent from alcohol use and has gained some weight and not exercising, but I have also restricted this until we could get better heart rate control. Unfortunately he's not maintaining a sinus rhythm despite his recent successful cardioversion. As mentioned above we discussed several options including stronger antiarrhythmic therapy and/or possibly ablation. I do believe he symptomatic with this and may need further work to get back into sinus rhythm. He wishes to discuss with his daughter and and 2 are both in cardiology about his options, but would also like another opinion from an electrophysiologist at all refer him to [name] for his opinion.  Follow-up in 6 months.  Pixie Casino, MD, [facility] Attending Cardiologist Floris C Hilty 03/23/2015, 11:00 AM

## 2015-04-10 ENCOUNTER — Encounter: Payer: Self-pay | Admitting: Internal Medicine

## 2015-04-10 ENCOUNTER — Ambulatory Visit (INDEPENDENT_AMBULATORY_CARE_PROVIDER_SITE_OTHER): Payer: 59 | Admitting: Internal Medicine

## 2015-04-10 VITALS — BP 124/82 | HR 97 | Ht 72.0 in | Wt 196.6 lb

## 2015-04-10 DIAGNOSIS — I481 Persistent atrial fibrillation: Secondary | ICD-10-CM

## 2015-04-10 DIAGNOSIS — I10 Essential (primary) hypertension: Secondary | ICD-10-CM

## 2015-04-10 DIAGNOSIS — I4819 Other persistent atrial fibrillation: Secondary | ICD-10-CM

## 2015-04-10 DIAGNOSIS — I4891 Unspecified atrial fibrillation: Secondary | ICD-10-CM

## 2015-04-10 MED ORDER — DILTIAZEM HCL ER 240 MG PO CP24: 240.0000 mg | ORAL_CAPSULE | Freq: Every day | ORAL | Status: DC

## 2015-04-10 NOTE — Patient Instructions (Signed)
Medication Instructions:  Your physician has recommended you make the following change in your medication:  1) Stop Flecainide 2) Increase Diltiazem to 240mg  daily   Labwork: None ordered   Testing/Procedures: None ordered   Follow-Up:  Your physician recommends that you schedule a follow-up appointment in: 3 months with Roderic Palau, NP   Any Other Special Instructions Will Be Listed Below (If Applicable).     If you need a refill on your cardiac medications before your next appointment, please call your pharmacy.

## 2015-04-10 NOTE — Progress Notes (Signed)
Electrophysiology Office Note   Date:  04/10/2015   ID:  Angel Shelton, DOB May 24, 1956, MRN [id number]  PCP:  Marton Redwood, MD  Cardiologist:  Dr Debara Pickett Primary Electrophysiologist: Thompson Grayer, MD    Chief Complaint  Patient presents with  . Persistent AFIB     History of Present Illness: Angel Shelton is a 59 y.o. male who presents today for electrophysiology evaluation.   The patient has persistent atrial fibrillation.  He is without symptoms with afib.  He is very active.  He recently presented to Dr Brigitte Pulse and was noted to be in AF at the time.  He has been evaluated by Dr Debara Pickett and treated with rate control.  The duration of his AF is not well known.  He has a h/o atriopathy by echo going back to at least 2006 (LA size then 35mm).  He has a h/o ETOH and has decreased usage recently.   He was placed on flecainide 50mg  BID and underwent recent cardioversion.  He returned briefly to sinus rhythm but has since degenerated back to afib.  He is not sure that he received any symptomatic benefits with sinus rhythm.  Today, he denies symptoms of palpitations, chest pain, shortness of breath, orthopnea, PND, lower extremity edema, claudication, dizziness, presyncope, syncope, bleeding, or neurologic sequela. The patient is tolerating medications without difficulties and is otherwise without complaint today.    Past Medical History  Diagnosis Date  . Persistent atrial fibrillation ([place])   . Diabetes mellitus without complication ([place])   . Hyperlipidemia   . Hypertension   . Left atrial enlargement   . Alcohol abuse    Past Surgical History  Procedure Laterality Date  . Tee without cardioversion N/A 11/01/2014    Procedure: TRANSESOPHAGEAL ECHOCARDIOGRAM (TEE);  Surgeon: Pixie Casino, MD;  Location: [place];  Service: Cardiovascular;  Laterality: N/A;  . Cardioversion N/A 11/01/2014    Procedure: CARDIOVERSION;  Surgeon: Pixie Casino, MD;  Location: [facility] ENDOSCOPY;   Service: Cardiovascular;  Laterality: N/A;  . Cardiac catheterization N/A 01/05/2015    Procedure: Left Heart Cath and Coronary Angiography;  Surgeon: Pixie Casino, MD;  Location: [place] CV LAB;  Service: Cardiovascular;  Laterality: N/A;  . Cardioversion N/A 02/24/2015    Procedure: CARDIOVERSION;  Surgeon: Pixie Casino, MD;  Location[facility] ENDOSCOPY;  Service: Cardiovascular;  Laterality: N/A;     Current Outpatient Prescriptions  Medication Sig Dispense Refill  . atorvastatin (LIPITOR) 20 MG tablet Take 20 mg by mouth daily.    . Cyanocobalamin (VITAMIN B-12 PO) Take 1 tablet by mouth daily.    Marland Kitchen diltiazem (DILACOR XR) 240 MG 24 hr capsule Take 1 capsule (240 mg total) by mouth daily. 90 capsule 3  . ELIQUIS 5 MG TABS tablet Take 5 mg by mouth 2 (two) times daily.  1  . fenofibrate 160 MG tablet Take 160 mg by mouth daily.    Marland Kitchen glimepiride (AMARYL) 1 MG tablet Take 1 mg by mouth daily.  0  . JENTADUETO 2.09-998 MG TABS Take 1 tablet by mouth 2 (two) times daily.  1  . lisinopril (PRINIVIL,ZESTRIL) 20 MG tablet Take 20 mg by mouth daily.  0  . metoprolol succinate (TOPROL-XL) 100 MG 24 hr tablet Take 100 mg by mouth daily.  1  . Multiple Vitamin (MULTIVITAMIN WITH MINERALS) TABS tablet Take 1 tablet by mouth daily.    . Omega-3 Fatty Acids (FISH OIL) 1200 MG CAPS Take 1,200 mg by mouth  3 (three) times daily.    Marland Kitchen VITAMIN E PO Take 1 capsule by mouth daily.     No current facility-administered medications for this visit.    Allergies:   Review of patient's allergies indicates no known allergies.   Social History:  The patient  reports that he quit smoking about 13 months ago. He does not have any smokeless tobacco history on file. He reports that he drinks alcohol. He reports that he does not use illicit drugs.   Family History:  The patient's  family history includes Heart disease in his father.    ROS:  Please see the history of present illness.   All other systems are  reviewed and negative.    PHYSICAL EXAM: VS:  BP 124/82 mmHg  Pulse 97  Ht 6' (1.829 m)  Wt 196 lb 9.6 oz (89.177 kg)  BMI 26.66 kg/m2 , BMI Body mass index is 26.66 kg/(m^2). GEN: Well nourished, well developed, in no acute distress HEENT: normal Neck: no JVD, carotid bruits, or masses Cardiac: iRRR; no murmurs, rubs, or gallops,no edema  Respiratory:  clear to auscultation bilaterally, normal work of breathing GI: soft, nontender, nondistended, + BS MS: no deformity or atrophy Skin: warm and dry  Neuro:  Strength and sensation are intact Psych: euthymic mood, full affect  EKG:  EKG is ordered today. The ekg ordered today shows afib 97 bpm   Recent Labs: 02/17/2015: BUN 17; Creat 1.01; Hemoglobin 15.0; Platelets 252; Potassium 4.9; Sodium 132*; TSH 3.485    Lipid Panel  No results found for: CHOL, TRIG, HDL, CHOLHDL, VLDL, LDLCALC, LDLDIRECT   Wt Readings from Last 3 Encounters:  04/10/15 196 lb 9.6 oz (89.177 kg)  03/23/15 196 lb 9.6 oz (89.177 kg)  02/24/15 192 lb (87.091 kg)      Other studies Reviewed: Additional studies/ records that were reviewed today include: echo 2006 (LA size 69mm), recent TEE which revealed LA dilatation, Dr Lysbeth Penner notes, recent cardioversion  Review of the above records today demonstrates: as above   ASSESSMENT AND PLAN:  1.  Persistent afib Minimally symptomatic and refractory to flecainide 50mg  BID. Therapeutic strategies for afib including rate and rhythm control were discussed in detail with the patient today.  We discussed rate control vs increased flecainide with repeat cardioversion vs tikosyn as options.  He is clear that he would like to stop flecainide and treat with rate control at his time.  I have increased diltiazem CD to 240mg  daily and stopped flecainide today. We discussed ablation and the fact that this procedure is reserved for symptomatic relief.  I would anticipate very low success rates from ablation in this patient.   He and I agree that this is not a good option for him. chads2vasc score is at least 2.  Continue long term anticoagulation.  2. HTN Stable Diltiazem increased today  Follow-up in the AF clinic with Roderic Palau NP for afib management going forward.  She will probably see annually thereafter. I will see as needed going forward.  Current medicines are reviewed at length with the patient today.   The patient does not have concerns regarding his medicines.  The following changes were made today:  none  Labs/ tests ordered today include:  Orders Placed This Encounter  Procedures  . EKG 12-Lead     Signed, Thompson Grayer, MD  04/10/2015 2:16 PM     [place] [address] [phone] (office) [phone] (fax)

## 2015-08-18 ENCOUNTER — Encounter (HOSPITAL_COMMUNITY): Payer: Self-pay | Admitting: Nurse Practitioner

## 2015-08-18 ENCOUNTER — Ambulatory Visit (HOSPITAL_COMMUNITY)
Admission: RE | Admit: 2015-08-18 | Discharge: 2015-08-18 | Disposition: A | Discharge status: Home / Self Care | Payer: 59 | Source: Ambulatory Visit | Attending: Nurse Practitioner | Admitting: Nurse Practitioner

## 2015-08-18 VITALS — BP 136/80 | HR 98 | Ht 72.0 in | Wt 195.8 lb

## 2015-08-18 DIAGNOSIS — I482 Chronic atrial fibrillation, unspecified: Secondary | ICD-10-CM

## 2015-08-18 DIAGNOSIS — I4891 Unspecified atrial fibrillation: Secondary | ICD-10-CM | POA: Insufficient documentation

## 2015-08-18 NOTE — Progress Notes (Signed)
Patient ID: Angel Shelton, male   DOB: 10/02/1955, 60 y.o.   MRN: [id number]     Primary Care Physician: Marton Redwood, MD Referring Physician: [name] is a 60 y.o. male with a h/o chronic afib that is here for f/u. On last visit with [name], it was decided that he would stop flecainide and be rate controlled since he is not symptomatic in afib. [name] did not think he would be a good ablation candidate.  His BP/HR are elevated this am but he has had a very busy morning at work. After resting BP improved at 136/80. He reports that he has HR readings at home around 80, BP around 120/80. He is still tolerating chronic afib well. Continues on eliquis without missed doses and denies bleeding issues.  Today, he denies symptoms of palpitations, chest pain, shortness of breath, orthopnea, PND, lower extremity edema, dizziness, presyncope, syncope, or neurologic sequela. The patient is tolerating medications without difficulties and is otherwise without complaint today.   Past Medical History  Diagnosis Date  . Persistent atrial fibrillation ([place])   . Diabetes mellitus without complication ([place])   . Hyperlipidemia   . Hypertension   . Left atrial enlargement   . Alcohol abuse    Past Surgical History  Procedure Laterality Date  . Tee without cardioversion N/A 11/01/2014    Procedure: TRANSESOPHAGEAL ECHOCARDIOGRAM (TEE);  Surgeon: Pixie Casino, MD;  Location: [place];  Service: Cardiovascular;  Laterality: N/A;  . Cardioversion N/A 11/01/2014    Procedure: CARDIOVERSION;  Surgeon: Pixie Casino, MD;  Location: [facility] ENDOSCOPY;  Service: Cardiovascular;  Laterality: N/A;  . Cardiac catheterization N/A 01/05/2015    Procedure: Left Heart Cath and Coronary Angiography;  Surgeon: Pixie Casino, MD;  Location: [place] CV LAB;  Service: Cardiovascular;  Laterality: N/A;  . Cardioversion N/A 02/24/2015    Procedure: CARDIOVERSION;  Surgeon: Pixie Casino,  MD;  Location: [facility] ENDOSCOPY;  Service: Cardiovascular;  Laterality: N/A;    Current Outpatient Prescriptions  Medication Sig Dispense Refill  . atorvastatin (LIPITOR) 20 MG tablet Take 20 mg by mouth daily.    . Cyanocobalamin (VITAMIN B-12 PO) Take 1 tablet by mouth daily.    Marland Kitchen diltiazem (DILACOR XR) 240 MG 24 hr capsule Take 1 capsule (240 mg total) by mouth daily. 90 capsule 3  . ELIQUIS 5 MG TABS tablet Take 5 mg by mouth 2 (two) times daily.  1  . fenofibrate 160 MG tablet Take 160 mg by mouth daily.    Marland Kitchen glimepiride (AMARYL) 1 MG tablet Take 1 mg by mouth daily.  0  . JENTADUETO 2.09-998 MG TABS Take 1 tablet by mouth 2 (two) times daily.  1  . lisinopril (PRINIVIL,ZESTRIL) 20 MG tablet Take 20 mg by mouth daily.  0  . metoprolol succinate (TOPROL-XL) 100 MG 24 hr tablet Take 100 mg by mouth daily.  1  . Multiple Vitamin (MULTIVITAMIN WITH MINERALS) TABS tablet Take 1 tablet by mouth daily.    . Omega-3 Fatty Acids (FISH OIL) 1200 MG CAPS Take 1,200 mg by mouth 3 (three) times daily.    Marland Kitchen VITAMIN E PO Take 1 capsule by mouth daily.     No current facility-administered medications for this encounter.    No Known Allergies  Social History   Social History  . Marital Status: Single    Spouse Name: N/A  . Number of Children: N/A  . Years of Education: N/A  Occupational History  . Not on file.   Social History Main Topics  . Smoking status: Former Smoker -- 1.00 packs/day for 30 years    Quit date: 03/05/2014  . Smokeless tobacco: Not on file  . Alcohol Use: 0.0 oz/week    0 Standard drinks or equivalent per week     Comment: previously drank more, has cut back  . Drug Use: No  . Sexual Activity: Not on file   Other Topics Concern  . Not on file   Social History Narrative   Works in the Hospital doctor    Family History  Problem Relation Age of Onset  . Heart disease Father     ROS- All systems are reviewed and negative except as per the HPI  above  Physical Exam: Filed Vitals:   08/18/15 1132 08/18/15 1149  BP: 160/88 136/80  Pulse: 98   Height: 6' (1.829 m)   Weight: 195 lb 12.8 oz (88.814 kg)     GEN- The patient is well appearing, alert and oriented x 3 today.   Head- normocephalic, atraumatic Eyes-  Sclera clear, conjunctiva pink Ears- hearing intact Oropharynx- clear Neck- supple, no JVP Lymph- no cervical lymphadenopathy Lungs- Clear to ausculation bilaterally, normal work of breathing Heart- Irregular rate and rhythm, no murmurs, rubs or gallops, PMI not laterally displaced GI- soft, NT, ND, + BS Extremities- no clubbing, cyanosis, or edema MS- no significant deformity or atrophy Skin- no rash or lesion Psych- euthymic mood, full affect Neuro- strength and sensation are intact  EKG- Afib at 98 bpm, qrs int at 88 bpm, qrs int 88 ms, qtc 408 ms Epic records reviewed. Last creatinine checked in September at 1.01 and is seen every 6 months by PCP with pending appointment next week   Assessment and Plan: 1. Afib Chronic and tolerating well Continue rate control  Continue eliquis with chadsvasc score of at least 2  2. HTN Elevated initially but contributes to very stressful morning at work Numbers appear normal at home  F/u in one year per recommendation of Dr. Lawrence Marseilles C. Nik Gorrell, [facility] [address] [place], [place] [address] [phone]

## 2015-12-26 ENCOUNTER — Encounter: Payer: Self-pay | Admitting: Internal Medicine

## 2016-01-08 ENCOUNTER — Encounter: Payer: Self-pay | Admitting: Internal Medicine

## 2016-03-19 ENCOUNTER — Ambulatory Visit (INDEPENDENT_AMBULATORY_CARE_PROVIDER_SITE_OTHER): Payer: 59 | Admitting: Internal Medicine

## 2016-03-19 ENCOUNTER — Telehealth: Payer: Self-pay

## 2016-03-19 ENCOUNTER — Encounter (INDEPENDENT_AMBULATORY_CARE_PROVIDER_SITE_OTHER): Payer: Self-pay

## 2016-03-19 ENCOUNTER — Encounter: Payer: Self-pay | Admitting: Internal Medicine

## 2016-03-19 VITALS — BP 140/62 | HR 88 | Ht 70.75 in | Wt 192.4 lb

## 2016-03-19 DIAGNOSIS — Z1211 Encounter for screening for malignant neoplasm of colon: Secondary | ICD-10-CM | POA: Diagnosis not present

## 2016-03-19 DIAGNOSIS — I4891 Unspecified atrial fibrillation: Secondary | ICD-10-CM

## 2016-03-19 DIAGNOSIS — Z7901 Long term (current) use of anticoagulants: Secondary | ICD-10-CM | POA: Diagnosis not present

## 2016-03-19 NOTE — Patient Instructions (Signed)
  You have been scheduled for a colonoscopy. Please follow written instructions given to you at your visit today.  Please pick up your prep supplies at the pharmacy . If you use inhalers (even only as needed), please bring them with you on the day of your procedure. Your physician has requested that you go to www.startemmi.com and enter the access code given to you at your visit today. This web site gives a general overview about your procedure. However, you should still follow specific instructions given to you by our office regarding your preparation for the procedure.   You will be contaced by our office prior to your procedure for directions on holding your Eliquis.  If you do not hear from our office 1 week prior to your scheduled procedure, please call [phone] to discuss.   I appreciate the opportunity to care for you. Silvano Rusk, MD, [facility]

## 2016-03-19 NOTE — Telephone Encounter (Signed)
[facility] [address]  [place] [address]  03/19/2016   RE: Angel Shelton DOB: [birth year] MRN: [id number]   Dear Dr Lyman Bishop,    We have scheduled the above patient for an endoscopic procedure. Our records show that he is on anticoagulation therapy.   Please advise as to how long the patient may come off his therapy of Eliquis prior to the colonoscopy procedure, which is scheduled for 04/01/2016.  Please fax back/ or route the completed form to Maritza [place], [place] at [phone].   Sincerely,    Silvano Rusk, MD, [facility]

## 2016-03-19 NOTE — Progress Notes (Signed)
Angel Shelton 60 y.o. 1955/12/03 [id number]  Assessment & Plan:   Encounter Diagnoses  Name Primary?  . Colon cancer screening Yes  . Long term current use of anticoagulant - Eliquis   . Atrial fibrillation, unspecified type [facility])     Screening colonoscopy will be scheduled. The risks and benefits as well as alternatives of endoscopic procedure(s) have been discussed and reviewed. All questions answered. The patient agrees to proceed. Will hold Eliquis 1-2 d before colonoscopy. Extra risk of possible stroke off eliquis reviewed w/ patient.   Subjective:   Chief Complaint: colon cancer screening  HPI Very nice man here - no GI sxs, 10 yrs since negative screening colonoscopy - on eliquis due to hx Afib. He is s/p diverticulitis surgery.  No Known Allergies Outpatient Medications Prior to Visit  Medication Sig Dispense Refill  . atorvastatin (LIPITOR) 20 MG tablet Take 20 mg by mouth daily.    . Cyanocobalamin (VITAMIN B-12 PO) Take 1 tablet by mouth daily.    Marland Kitchen diltiazem (DILACOR XR) 240 MG 24 hr capsule Take 1 capsule (240 mg total) by mouth daily. 90 capsule 3  . ELIQUIS 5 MG TABS tablet Take 5 mg by mouth 2 (two) times daily.  1  . fenofibrate 160 MG tablet Take 160 mg by mouth daily.    Marland Kitchen glimepiride (AMARYL) 1 MG tablet Take 1 mg by mouth daily.  0  . JENTADUETO 2.09-998 MG TABS Take 1 tablet by mouth 2 (two) times daily.  1  . lisinopril (PRINIVIL,ZESTRIL) 20 MG tablet Take 20 mg by mouth daily.  0  . metoprolol succinate (TOPROL-XL) 100 MG 24 hr tablet Take 100 mg by mouth daily.  1  . Multiple Vitamin (MULTIVITAMIN WITH MINERALS) TABS tablet Take 1 tablet by mouth daily.    . Omega-3 Fatty Acids (FISH OIL) 1200 MG CAPS Take 1,200 mg by mouth 3 (three) times daily.    Marland Kitchen VITAMIN E PO Take 1 capsule by mouth daily.     No facility-administered medications prior to visit.    Past Medical History:  Diagnosis Date  . Alcohol abuse   . Diabetes mellitus without  complication ([place])   . Diverticulosis   . Hyperlipidemia   . Hypertension   . Left atrial enlargement   . Persistent atrial fibrillation [facility])    Past Surgical History:  Procedure Laterality Date  . APPENDECTOMY    . CARDIAC CATHETERIZATION N/A 01/05/2015   Procedure: Left Heart Cath and Coronary Angiography;  Surgeon: Pixie Casino, MD;  Location: [place] CV LAB;  Service: Cardiovascular;  Laterality: N/A;  . CARDIOVERSION N/A 11/01/2014   Procedure: CARDIOVERSION;  Surgeon: Pixie Casino, MD;  Location: [facility] ENDOSCOPY;  Service: Cardiovascular;  Laterality: N/A;  . CARDIOVERSION N/A 02/24/2015   Procedure: CARDIOVERSION;  Surgeon: Pixie Casino, MD;  Location: [place];  Service: Cardiovascular;  Laterality: N/A;  . COLON SURGERY    . COLONOSCOPY     Per stoma  . OSTOMY    . TEE WITHOUT CARDIOVERSION N/A 11/01/2014   Procedure: TRANSESOPHAGEAL ECHOCARDIOGRAM (TEE);  Surgeon: Pixie Casino, MD;  Location: [facility] ENDOSCOPY;  Service: Cardiovascular;  Laterality: N/A;   Social History   Social History  . Marital status: Single    Spouse name: N/A  . Number of children: 2  . Years of education: N/A   Occupational History  . Building services engineer    Social History Main Topics  . Smoking status: Former Smoker  Packs/day: 1.00    Years: 30.00    Quit date: 03/05/2014  . Smokeless tobacco: Never Used  . Alcohol use 0.0 oz/week     Comment: previously drank more, has cut back 3 per day  . Drug use: No  . Sexual activity: Not Asked   Other Topics Concern  . None   Social History Narrative   Works in the Hospital doctor   Family History  Problem Relation Age of Onset  . Heart disease Father   . Stroke Father   . Diabetes Mother   . Diabetes Maternal Grandmother   . Stroke Maternal Grandmother   . Heart attack Paternal Grandfather   . Hodgkin's lymphoma Paternal Aunt   . Hodgkin's lymphoma Paternal Uncle     Review of Systems No chest pain,  breathing problems.   Objective:   Physical Exam @BP  140/62 (BP Location: Left Arm, Patient Position: Sitting, Cuff Size: Normal)   Pulse 88 Comment: irregular  Ht 5' 10.75" (1.797 m) Comment: height measured without shoes  Wt 192 lb 6 oz (87.3 kg)   BMI 27.02 kg/m @  General:  NAD Eyes:   anicteric Lungs:  clear Heart::  S1S2 no rubs, murmurs or gallops Abdomen:  soft and nontender, BS+ Ext:   no edema, cyanosis or clubbing    Data Reviewed:   2007 colonoscopy Cardiology notes

## 2016-03-20 NOTE — Telephone Encounter (Signed)
Dr Debara Pickett approved holding the Eliquis 3 days prior to his procedure, patient informed and verbalized understanding.

## 2016-03-20 NOTE — Progress Notes (Signed)
Patient informed and verbalized understanding about holding his Eliquis.

## 2016-03-22 ENCOUNTER — Encounter: Payer: Self-pay | Admitting: Internal Medicine

## 2016-04-01 ENCOUNTER — Encounter: Payer: 59 | Admitting: Internal Medicine

## 2016-04-26 ENCOUNTER — Other Ambulatory Visit: Payer: Self-pay | Admitting: Internal Medicine

## 2016-05-26 ENCOUNTER — Other Ambulatory Visit: Payer: Self-pay | Admitting: Internal Medicine

## 2016-06-07 ENCOUNTER — Ambulatory Visit (AMBULATORY_SURGERY_CENTER): Payer: 59 | Admitting: Internal Medicine

## 2016-06-07 ENCOUNTER — Encounter: Payer: Self-pay | Admitting: Internal Medicine

## 2016-06-07 VITALS — BP 114/63 | HR 64 | Temp 97.3°F | Resp 13 | Ht 70.75 in | Wt 192.0 lb

## 2016-06-07 DIAGNOSIS — E119 Type 2 diabetes mellitus without complications: Secondary | ICD-10-CM | POA: Diagnosis not present

## 2016-06-07 DIAGNOSIS — Z1212 Encounter for screening for malignant neoplasm of rectum: Secondary | ICD-10-CM

## 2016-06-07 DIAGNOSIS — I4891 Unspecified atrial fibrillation: Secondary | ICD-10-CM | POA: Diagnosis not present

## 2016-06-07 DIAGNOSIS — Z1211 Encounter for screening for malignant neoplasm of colon: Secondary | ICD-10-CM | POA: Diagnosis present

## 2016-06-07 LAB — GLUCOSE, CAPILLARY
GLUCOSE-CAPILLARY: 207 mg/dL — AB (ref 65–99)
Glucose-Capillary: 249 mg/dL — ABNORMAL HIGH (ref 65–99)

## 2016-06-07 MED ORDER — SODIUM CHLORIDE 0.9 % IV SOLN: 500.0000 mL | INTRAVENOUS | Status: DC

## 2016-06-07 NOTE — Op Note (Signed)
[place] Patient Name: Angel Shelton Procedure Date: 06/07/2016 7:58 AM MRN: [id number] Endoscopist: Gatha Mayer , MD Age: 61 Referring MD:  Date of Birth: 1955-09-12 Gender: Male Account #: [id number] Procedure:                Colonoscopy Indications:              Screening for colorectal malignant neoplasm, Last                            colonoscopy: 2007 Medicines:                Propofol per Anesthesia, Monitored Anesthesia Care Procedure:                Pre-Anesthesia Assessment:                           - Prior to the procedure, a History and Physical                            was performed, and patient medications and                            allergies were reviewed. The patient's tolerance of                            previous anesthesia was also reviewed. The risks                            and benefits of the procedure and the sedation                            options and risks were discussed with the patient.                            All questions were answered, and informed consent                            was obtained. Prior Anticoagulants: The patient                            last took Eliquis (apixaban) 3 days prior to the                            procedure. ASA Grade Assessment: III - A patient                            with severe systemic disease. After reviewing the                            risks and benefits, the patient was deemed in                            satisfactory condition to undergo the procedure.  After obtaining informed consent, the colonoscope                            was passed under direct vision. Throughout the                            procedure, the patient's blood pressure, pulse, and                            oxygen saturations were monitored continuously. The                            Model CF-HQ190L [id number]) scope was introduced                            through the  anus and advanced to the the cecum,                            identified by appendiceal orifice and ileocecal                            valve. The colonoscopy was performed without                            difficulty. The patient tolerated the procedure                            well. The quality of the bowel preparation was                            excellent. The bowel preparation used was Miralax.                            The ileocecal valve, appendiceal orifice, and                            rectum were photographed. Scope In: 8:02:49 AM Scope Out: 8:12:09 AM Scope Withdrawal Time: 0 hours 7 minutes 17 seconds  Total Procedure Duration: 0 hours 9 minutes 20 seconds  Findings:                 The perianal and digital rectal examinations were                            normal.                           Scattered large-mouthed diverticula were found in                            the entire colon.                           The exam was otherwise without abnormality on  direct and retroflexion views.                           s/p segmental resection for diverticulitis but                            could not identify an anastomosis Complications:            No immediate complications. Estimated Blood Loss:     Estimated blood loss: none. Impression:               - Diverticulosis in the entire examined colon.                           - The examination was otherwise normal on direct                            and retroflexion views.                           - No specimens collected. Recommendation:           - Patient has a contact number available for                            emergencies. The signs and symptoms of potential                            delayed complications were discussed with the                            patient. Return to normal activities tomorrow.                            Written discharge instructions were provided to the                             patient.                           - Resume previous diet.                           - Continue present medications.                           - Resume Eliquis (apixaban) at prior dose today.                           - Repeat colonoscopy in 10 years for screening                            purposes. Gatha Mayer, MD 06/07/2016 8:19:33 AM This report has been signed electronically.

## 2016-06-07 NOTE — Progress Notes (Signed)
To recovery, report to Scott, RN, VSS 

## 2016-06-07 NOTE — Patient Instructions (Addendum)
No polyps or cancer seen today. Some scattered diverticulosis.  Next routine colonoscopy in 10 years - 2028.  Resume all medications including Eliquis today.  I appreciate the opportunity to care for you. Gatha Mayer, MD, FACG  YOU HAD AN ENDOSCOPIC PROCEDURE TODAY AT [facility]:   Refer to the procedure report that was given to you for any specific questions about what was found during the examination.  If the procedure report does not answer your questions, please call your gastroenterologist to clarify.  If you requested that your care partner not be given the details of your procedure findings, then the procedure report has been included in a sealed envelope for you to review at your convenience later.  YOU SHOULD EXPECT: Some feelings of bloating in the abdomen. Passage of more gas than usual.  Walking can help get rid of the air that was put into your GI tract during the procedure and reduce the bloating. If you had a lower endoscopy (such as a colonoscopy or flexible sigmoidoscopy) you may notice spotting of blood in your stool or on the toilet paper. If you underwent a bowel prep for your procedure, you may not have a normal bowel movement for a few days.  Please Note:  You might notice some irritation and congestion in your nose or some drainage.  This is from the oxygen used during your procedure.  There is no need for concern and it should clear up in a day or so.  SYMPTOMS TO REPORT IMMEDIATELY:   Following lower endoscopy (colonoscopy or flexible sigmoidoscopy):  Excessive amounts of blood in the stool  Significant tenderness or worsening of abdominal pains  Swelling of the abdomen that is new, acute  Fever of 100F or higher   Following upper endoscopy (EGD)  Vomiting of blood or coffee ground material  New chest pain or pain under the shoulder blades  Painful or persistently difficult swallowing  New shortness of breath  Fever of 100F or  higher  Black, tarry-looking stools  For urgent or emergent issues, a gastroenterologist can be reached at any hour by calling [phone].   DIET:  We do recommend a small meal at first, but then you may proceed to your regular diet.  Drink plenty of fluids but you should avoid alcoholic beverages for 24 hours.  ACTIVITY:  You should plan to take it easy for the rest of today and you should NOT DRIVE or use heavy machinery until tomorrow (because of the sedation medicines used during the test).    FOLLOW UP: Our staff will call the number listed on your records the next business day following your procedure to check on you and address any questions or concerns that you may have regarding the information given to you following your procedure. If we do not reach you, we will leave a message.  However, if you are feeling well and you are not experiencing any problems, there is no need to return our call.  We will assume that you have returned to your regular daily activities without incident.  If any biopsies were taken you will be contacted by phone or by letter within the next 1-3 weeks.  Please call us at [phone] if you have not heard about the biopsies in 3 weeks.    SIGNATURES/CONFIDENTIALITY: You and/or your care partner have signed paperwork which will be entered into your electronic medical record.  These signatures attest to the fact that that the  information above on your After Visit Summary has been reviewed and is understood.  Full responsibility of the confidentiality of this discharge information lies with you and/or your care-partner.

## 2016-06-10 ENCOUNTER — Telehealth: Payer: Self-pay

## 2016-06-10 NOTE — Telephone Encounter (Signed)
  Follow up Call-  Call back number 06/07/2016  Post procedure Call Back phone  # [phone] cell  Permission to leave phone message Yes  Some recent data might be hidden     Patient questions:  Do you have a fever, pain , or abdominal swelling? No. Pain Score  0 *  Have you tolerated food without any problems? Yes.    Have you been able to return to your normal activities? Yes.    Do you have any questions about your discharge instructions: Diet   No. Medications  No. Follow up visit  No.  Do you have questions or concerns about your Care? No.  Actions: * If pain score is 4 or above: No action needed, pain <4.

## 2016-06-10 NOTE — Telephone Encounter (Signed)
  Follow up Call-  Call back number 06/07/2016  Post procedure Call Back phone  # [phone] cell  Permission to leave phone message Yes  Some recent data might be hidden    Patient was called for follow up after his procedure on 06/07/2016. No answer at the number given for follow up phone call. A message was left on the answering machine.

## 2016-08-19 ENCOUNTER — Encounter (HOSPITAL_COMMUNITY): Payer: Self-pay | Admitting: Nurse Practitioner

## 2016-08-19 ENCOUNTER — Ambulatory Visit (HOSPITAL_COMMUNITY)
Admission: RE | Admit: 2016-08-19 | Discharge: 2016-08-19 | Disposition: A | Discharge status: Home / Self Care | Payer: 59 | Source: Ambulatory Visit | Attending: Nurse Practitioner | Admitting: Nurse Practitioner

## 2016-08-19 VITALS — BP 158/86 | HR 95 | Ht 70.75 in | Wt 188.4 lb

## 2016-08-19 DIAGNOSIS — I482 Chronic atrial fibrillation, unspecified: Secondary | ICD-10-CM

## 2016-08-19 DIAGNOSIS — Z87891 Personal history of nicotine dependence: Secondary | ICD-10-CM | POA: Diagnosis not present

## 2016-08-19 DIAGNOSIS — I4891 Unspecified atrial fibrillation: Secondary | ICD-10-CM | POA: Diagnosis present

## 2016-08-19 DIAGNOSIS — I493 Ventricular premature depolarization: Secondary | ICD-10-CM | POA: Diagnosis not present

## 2016-08-19 DIAGNOSIS — Z7984 Long term (current) use of oral hypoglycemic drugs: Secondary | ICD-10-CM | POA: Insufficient documentation

## 2016-08-19 DIAGNOSIS — Z79899 Other long term (current) drug therapy: Secondary | ICD-10-CM | POA: Diagnosis not present

## 2016-08-19 DIAGNOSIS — Z7901 Long term (current) use of anticoagulants: Secondary | ICD-10-CM | POA: Insufficient documentation

## 2016-08-19 DIAGNOSIS — I119 Hypertensive heart disease without heart failure: Secondary | ICD-10-CM | POA: Insufficient documentation

## 2016-08-19 NOTE — Progress Notes (Signed)
Patient ID: Angel Shelton, male   DOB: 05/05/1956, 61 y.o.   MRN: [id number]     Primary Care Physician: Angel Redwood, MD Referring Physician: Dr. Lyn Shelton is a 61 y.o. male with a h/o chronic afib that is here for f/u. On last visit with Dr. Rayann Shelton, it was decided that he would stop flecainide and be rate controlled since he is not symptomatic in afib. Dr. Rayann Shelton did not think he would be a good ablation candidate.  His BP/HR are elevated this am but he has had a very busy morning at work. After resting BP improved at 136/80. He reports that he has HR readings at home around 80, BP around 120/80. He is still tolerating chronic afib well. Continues on eliquis without missed doses and denies bleeding issues.  F/u one year afib clinic f/u. He continues in permanent afib, rate controlled. He states that afib does not bother him. Continues on eliquis 5 mg bid. Followed closely by Dr. Brigitte Shelton.  Today, he denies symptoms of palpitations, chest pain, shortness of breath, orthopnea, PND, lower extremity edema, dizziness, presyncope, syncope, or neurologic sequela. The patient is tolerating medications without difficulties and is otherwise without complaint today.   Past Medical History:  Diagnosis Date  . Alcohol abuse   . Cataract    bil cateracts removed  . Diabetes mellitus without complication ([place])   . Diverticulosis   . Hyperlipidemia   . Hypertension   . Left atrial enlargement   . Persistent atrial fibrillation [facility])    Past Surgical History:  Procedure Laterality Date  . APPENDECTOMY    . CARDIAC CATHETERIZATION N/A 01/05/2015   Procedure: Left Heart Cath and Coronary Angiography;  Surgeon: Pixie Casino, MD;  Location: [place] [facility];  Service: Cardiovascular;  Laterality: N/A;  . CARDIOVERSION N/A 11/01/2014   Procedure: CARDIOVERSION;  Surgeon: Pixie Casino, MD;  Location: [facility] ENDOSCOPY;  Service: Cardiovascular;  Laterality: N/A;  . CARDIOVERSION N/A  02/24/2015   Procedure: CARDIOVERSION;  Surgeon: Pixie Casino, MD;  Location: [facility] ENDOSCOPY;  Service: Cardiovascular;  Laterality: N/A;  . COLON SURGERY     14 inches removed from diverticitis perforation  . COLONOSCOPY     Per stoma  . INNER EAR SURGERY    . OSTOMY    . TEE WITHOUT CARDIOVERSION N/A 11/01/2014   Procedure: TRANSESOPHAGEAL ECHOCARDIOGRAM (TEE);  Surgeon: Pixie Casino, MD;  Location: [facility] ENDOSCOPY;  Service: Cardiovascular;  Laterality: N/A;    Current Outpatient Prescriptions  Medication Sig Dispense Refill  . atorvastatin (LIPITOR) 20 MG tablet Take 20 mg by mouth daily.    . Cyanocobalamin (VITAMIN B-12 PO) Take 1 tablet by mouth daily.    Marland Kitchen DILT-XR 240 MG 24 hr capsule take 1 capsule by mouth once daily 30 capsule 0  . ELIQUIS 5 MG TABS tablet Take 5 mg by mouth 2 (two) times daily.  1  . fenofibrate 160 MG tablet Take 160 mg by mouth daily.    Marland Kitchen glimepiride (AMARYL) 1 MG tablet Take 1 mg by mouth daily.  0  . JENTADUETO 2.09-998 MG TABS Take 1 tablet by mouth 2 (two) times daily.  1  . Lactobacillus-Inulin (PROBIOTIC DIGESTIVE SUPPORT PO) Take 1 tablet by mouth daily.    Marland Kitchen lisinopril (PRINIVIL,ZESTRIL) 20 MG tablet Take 20 mg by mouth daily.  0  . metoprolol succinate (TOPROL-XL) 100 MG 24 hr tablet Take 100 mg by mouth daily.  1  . Multiple  Vitamin (MULTIVITAMIN WITH MINERALS) TABS tablet Take 1 tablet by mouth daily.    . Omega-3 Fatty Acids (FISH OIL) 1200 MG CAPS Take 1,200 mg by mouth 3 (three) times daily.    Marland Kitchen VITAMIN E PO Take 1 capsule by mouth daily.     Current Facility-Administered Medications  Medication Dose Route Frequency Provider Last Rate Last Dose  . 0.9 %  sodium chloride infusion  500 mL Intravenous Continuous Gatha Mayer, MD        No Known Allergies  Social History   Social History  . Marital status: Single    Spouse name: N/A  . Number of children: 2  . Years of education: N/A   Occupational History  . Building services engineer      Social History Main Topics  . Smoking status: Former Smoker    Packs/day: 1.00    Years: 30.00    Quit date: 03/05/2014  . Smokeless tobacco: Never Used  . Alcohol use 8.4 oz/week    14 Shots of liquor per week     Comment: previously drank more, has cut back 3 per day  . Drug use: No  . Sexual activity: Not on file   Other Topics Concern  . Not on file   Social History Narrative   Works in the Hospital doctor    Family History  Problem Relation Age of Onset  . Heart disease Father   . Stroke Father   . Diabetes Mother   . Diabetes Maternal Grandmother   . Stroke Maternal Grandmother   . Heart attack Paternal Grandfather   . Hodgkin's lymphoma Paternal Aunt   . Hodgkin's lymphoma Paternal Uncle   . Colon cancer Neg Hx   . Esophageal cancer Neg Hx   . Pancreatic cancer Neg Hx   . Prostate cancer Neg Hx   . Rectal cancer Neg Hx   . Stomach cancer Neg Hx     ROS- All systems are reviewed and negative except as per the HPI above  Physical Exam: Vitals:   08/19/16 1026  BP: (!) 158/86  Shelton: 95  Weight: 188 lb 6.4 oz (85.5 kg)  Height: 5' 10.75" (1.797 m)    GEN- The patient is well appearing, alert and oriented x 3 today.   Head- normocephalic, atraumatic Eyes-  Sclera clear, conjunctiva pink Ears- hearing intact Oropharynx- clear Neck- supple, no JVP Lymph- no cervical lymphadenopathy Lungs- Clear to ausculation bilaterally, normal work of breathing Heart- Irregular rate and rhythm, no murmurs, rubs or gallops, PMI not laterally displaced GI- soft, NT, ND, + BS Extremities- no clubbing, cyanosis, or edema MS- no significant deformity or atrophy Skin- no rash or lesion Psych- euthymic mood, full affect Neuro- strength and sensation are intact  EKG- Afib at 98 bpm, qrs int at 88 bpm, qrs int 88 ms, qtc 408 ms Epic Shelton reviewed. Last creatinine checked in September at 1.01 and is seen every 6 months by PCP with pending appointment next  week   Assessment and Plan: 1. Afib Chronic and tolerating well Continue rate control with BB, CCB Continue eliquis with chadsvasc score of at least 2  2. HTN Stable, pt states better readings at home  F/u in afib clinic as needed Continue close f/u with pcp  Butch Penny C. Kainon Varady, [facility] [address] [place], [place] [address] [phone]

## 2016-09-25 DIAGNOSIS — Z23 Encounter for immunization: Secondary | ICD-10-CM | POA: Diagnosis not present

## 2016-10-11 DIAGNOSIS — Z0000 Encounter for general adult medical examination without abnormal findings: Secondary | ICD-10-CM | POA: Diagnosis not present

## 2016-10-18 DIAGNOSIS — Z0000 Encounter for general adult medical examination without abnormal findings: Secondary | ICD-10-CM | POA: Diagnosis not present

## 2016-10-18 DIAGNOSIS — Z1389 Encounter for screening for other disorder: Secondary | ICD-10-CM | POA: Diagnosis not present

## 2016-10-18 DIAGNOSIS — Z125 Encounter for screening for malignant neoplasm of prostate: Secondary | ICD-10-CM | POA: Diagnosis not present

## 2016-10-18 DIAGNOSIS — E11319 Type 2 diabetes mellitus with unspecified diabetic retinopathy without macular edema: Secondary | ICD-10-CM | POA: Diagnosis not present

## 2016-10-18 DIAGNOSIS — E1129 Type 2 diabetes mellitus with other diabetic kidney complication: Secondary | ICD-10-CM | POA: Diagnosis not present

## 2016-10-18 DIAGNOSIS — E1140 Type 2 diabetes mellitus with diabetic neuropathy, unspecified: Secondary | ICD-10-CM | POA: Diagnosis not present

## 2016-11-29 DIAGNOSIS — Z23 Encounter for immunization: Secondary | ICD-10-CM | POA: Diagnosis not present

## 2016-12-27 ENCOUNTER — Other Ambulatory Visit: Payer: Self-pay | Admitting: Internal Medicine

## 2017-01-13 DIAGNOSIS — H26491 Other secondary cataract, right eye: Secondary | ICD-10-CM | POA: Diagnosis not present

## 2017-01-13 DIAGNOSIS — H40013 Open angle with borderline findings, low risk, bilateral: Secondary | ICD-10-CM | POA: Diagnosis not present

## 2017-01-13 DIAGNOSIS — E119 Type 2 diabetes mellitus without complications: Secondary | ICD-10-CM | POA: Diagnosis not present

## 2017-01-25 DIAGNOSIS — Z23 Encounter for immunization: Secondary | ICD-10-CM | POA: Diagnosis not present

## 2017-06-20 DIAGNOSIS — E1140 Type 2 diabetes mellitus with diabetic neuropathy, unspecified: Secondary | ICD-10-CM | POA: Diagnosis not present

## 2017-06-20 DIAGNOSIS — E1129 Type 2 diabetes mellitus with other diabetic kidney complication: Secondary | ICD-10-CM | POA: Diagnosis not present

## 2017-06-20 DIAGNOSIS — E11319 Type 2 diabetes mellitus with unspecified diabetic retinopathy without macular edema: Secondary | ICD-10-CM | POA: Diagnosis not present

## 2017-12-23 DIAGNOSIS — I10 Essential (primary) hypertension: Secondary | ICD-10-CM | POA: Diagnosis not present

## 2017-12-23 DIAGNOSIS — E7849 Other hyperlipidemia: Secondary | ICD-10-CM | POA: Diagnosis not present

## 2017-12-23 DIAGNOSIS — E1129 Type 2 diabetes mellitus with other diabetic kidney complication: Secondary | ICD-10-CM | POA: Diagnosis not present

## 2017-12-23 DIAGNOSIS — R82998 Other abnormal findings in urine: Secondary | ICD-10-CM | POA: Diagnosis not present

## 2017-12-30 DIAGNOSIS — E11319 Type 2 diabetes mellitus with unspecified diabetic retinopathy without macular edema: Secondary | ICD-10-CM | POA: Diagnosis not present

## 2017-12-30 DIAGNOSIS — E1140 Type 2 diabetes mellitus with diabetic neuropathy, unspecified: Secondary | ICD-10-CM | POA: Diagnosis not present

## 2017-12-30 DIAGNOSIS — Z0000 Encounter for general adult medical examination without abnormal findings: Secondary | ICD-10-CM | POA: Diagnosis not present

## 2017-12-30 DIAGNOSIS — Z1389 Encounter for screening for other disorder: Secondary | ICD-10-CM | POA: Diagnosis not present

## 2017-12-30 DIAGNOSIS — E1129 Type 2 diabetes mellitus with other diabetic kidney complication: Secondary | ICD-10-CM | POA: Diagnosis not present

## 2018-01-15 DIAGNOSIS — H40013 Open angle with borderline findings, low risk, bilateral: Secondary | ICD-10-CM | POA: Diagnosis not present

## 2018-01-15 DIAGNOSIS — H31012 Macula scars of posterior pole (postinflammatory) (post-traumatic), left eye: Secondary | ICD-10-CM | POA: Diagnosis not present

## 2018-01-15 DIAGNOSIS — E119 Type 2 diabetes mellitus without complications: Secondary | ICD-10-CM | POA: Diagnosis not present

## 2018-01-15 DIAGNOSIS — H26491 Other secondary cataract, right eye: Secondary | ICD-10-CM | POA: Diagnosis not present

## 2018-04-20 DIAGNOSIS — I480 Paroxysmal atrial fibrillation: Secondary | ICD-10-CM | POA: Diagnosis not present

## 2018-04-20 DIAGNOSIS — K921 Melena: Secondary | ICD-10-CM | POA: Diagnosis not present

## 2018-04-20 DIAGNOSIS — Z7901 Long term (current) use of anticoagulants: Secondary | ICD-10-CM | POA: Diagnosis not present

## 2018-05-07 ENCOUNTER — Ambulatory Visit: Payer: 59 | Admitting: Physician Assistant

## 2018-05-07 ENCOUNTER — Encounter: Payer: Self-pay | Admitting: Physician Assistant

## 2018-05-07 VITALS — BP 121/60 | HR 79 | Ht 71.0 in | Wt 182.4 lb

## 2018-05-07 DIAGNOSIS — Z7901 Long term (current) use of anticoagulants: Secondary | ICD-10-CM

## 2018-05-07 DIAGNOSIS — K5731 Diverticulosis of large intestine without perforation or abscess with bleeding: Secondary | ICD-10-CM | POA: Diagnosis not present

## 2018-05-07 DIAGNOSIS — K625 Hemorrhage of anus and rectum: Secondary | ICD-10-CM | POA: Diagnosis not present

## 2018-05-07 MED ORDER — HYDROCORTISONE ACETATE 25 MG RE SUPP: RECTAL | 2 refills | Status: DC

## 2018-05-07 NOTE — Patient Instructions (Addendum)
Stay off the Eliquis.  Sent a prescription for suppositories to [facility] [address]   Normal BMI (Body Mass Index- based on height and weight) is between 19 and 25. Your BMI today is Body mass index is 25.44 kg/m. Marland Kitchen Please consider follow up  regarding your BMI with your Primary Care Provider.

## 2018-05-07 NOTE — Progress Notes (Signed)
Subjective:    Patient ID: Angel Shelton, male    DOB: Sep 04, 1955, 62 y.o.   MRN: [id number]  HPI Angel Shelton is a pleasant 62 year old white male, known to Dr. Carlean Purl who was last seen in in January 2018 when he had colonoscopy.  He is referred back today by Dr. Brigitte Pulse for evaluation of rectal bleeding. Patient has history of chronic atrial fibrillation, and is maintained on Eliquis.  Also with hypertension, dyslipidemia and adult onset diabetes mellitus. Says his current symptoms have been sent over the past 3 weeks.  He has not had any prior issues with rectal bleeding nor any hemorrhoidal issues.  He says he noticed bright red blood with a bowel movement.  He says the blood seems to come out first followed by normal-appearing formed brown stool.  He has on occasion had a little oozing of blood immediately after a bowel movement.  He has not had any anal or rectal discomfort and denies any abdominal discomfort or cramping.  No problems with straining or constipation.  No changes in bowel habits. Since onset of symptoms he has noticed blood every day and with every bowel movement. He was seen by Dr. Brigitte Pulse on 04/30/2018, Eliquis was placed on hold and he had labs done with hemoglobin of 14.2 hematocrit of 42.3 MCV of 99.6.  Rectal exam was done by Dr. Brigitte Pulse with no hemorrhoids or lesions noted.  Last colonoscopy January 2018 with finding of scattered [place] diverticuli throughout the colon, patient is status post previous segmental resection for diverticulitis but anastomosis was not noted.  There were no internal hemorrhoids found, and no polyps.  Patient does have prior history of diverticulitis recurrent and underwent resection with colostomy in 2007 and colostomy reversal in 2008. Family history negative for colon cancer and polyps.  Review of Systems Pertinent positive and negative review of systems were noted in the above HPI section.  All other review of systems was otherwise  negative.  Outpatient Encounter Medications as of 05/07/2018  Medication Sig  . atorvastatin (LIPITOR) 20 MG tablet Take 20 mg by mouth daily.  . Cyanocobalamin (VITAMIN B-12 PO) Take 1 tablet by mouth daily.  Marland Kitchen DILT-XR 240 MG 24 hr capsule take 1 capsule by mouth once daily  . fenofibrate 160 MG tablet Take 160 mg by mouth daily.  . JENTADUETO 2.09-998 MG TABS Take 1 tablet by mouth 2 (two) times daily.  . Lactobacillus-Inulin (PROBIOTIC DIGESTIVE SUPPORT PO) Take 1 tablet by mouth daily.  Marland Kitchen lisinopril (PRINIVIL,ZESTRIL) 20 MG tablet Take 20 mg by mouth daily.  . metoprolol succinate (TOPROL-XL) 100 MG 24 hr tablet Take 100 mg by mouth daily.  Marland Kitchen ELIQUIS 5 MG TABS tablet Take 5 mg by mouth 2 (two) times daily.  . hydrocortisone (ANUSOL-HC) 25 MG suppository Use 1 suppository at bedtime for 14 days, then repeat as needed.  . Multiple Vitamin (MULTIVITAMIN WITH MINERALS) TABS tablet Take 1 tablet by mouth daily.  . Omega-3 Fatty Acids (FISH OIL) 1200 MG CAPS Take 1,200 mg by mouth 3 (three) times daily.  Marland Kitchen VITAMIN E PO Take 1 capsule by mouth daily.  . [DISCONTINUED] glimepiride (AMARYL) 1 MG tablet Take 1 mg by mouth daily.   Facility-Administered Encounter Medications as of 05/07/2018  Medication  . 0.9 %  sodium chloride infusion   No Known Allergies Patient Active Problem List   Diagnosis Date Noted  . Persistent atrial fibrillation 11/24/2014  . Atrial fibrillation, unspecified   . Encounter for cardioversion procedure   .  Atrial fibrillation with RVR ([place]) 09/29/2014  . DM2 (diabetes mellitus, type 2) ([place]) 09/29/2014  . Essential hypertension 09/29/2014  . Dyslipidemia 09/29/2014   Social History   Socioeconomic History  . Marital status: Single    Spouse name: Not on file  . Number of children: 2  . Years of education: Not on file  . Highest education level: Not on file  Occupational History  . Occupation: Building services engineer  Social Needs  . Financial resource  strain: Not on file  . Food insecurity:    Worry: Not on file    Inability: Not on file  . Transportation needs:    Medical: Not on file    Non-medical: Not on file  Tobacco Use  . Smoking status: Former Smoker    Packs/day: 1.00    Years: 30.00    Pack years: 30.00    Last attempt to quit: 03/05/2014    Years since quitting: 4.1  . Smokeless tobacco: Never Used  Substance and Sexual Activity  . Alcohol use: Yes    Alcohol/week: 14.0 standard drinks    Types: 14 Shots of liquor per week    Comment: previously drank more, has cut back 3 per day  . Drug use: No  . Sexual activity: Not on file  Lifestyle  . Physical activity:    Days per week: Not on file    Minutes per session: Not on file  . Stress: Not on file  Relationships  . Social connections:    Talks on phone: Not on file    Gets together: Not on file    Attends religious service: Not on file    Active member of club or organization: Not on file    Attends meetings of clubs or organizations: Not on file    Relationship status: Not on file  . Intimate partner violence:    Fear of current or ex partner: Not on file    Emotionally abused: Not on file    Physically abused: Not on file    Forced sexual activity: Not on file  Other Topics Concern  . Not on file  Social History Narrative   Works in the Hospital doctor    Mr. Balding family history includes Diabetes in his maternal grandmother and mother; Heart attack in his paternal grandfather; Heart disease in his father; Hodgkin's lymphoma in his paternal aunt and paternal uncle; Stroke in his father and maternal grandmother.      Objective:    Vitals:   05/07/18 1531  BP: 121/60  Pulse: 79  SpO2: 97%    Physical Exam; well-developed older white male in no acute distress, pleasant blood pressure 121/60 pulse 79, BMI 25.4.  HEENT ;nontraumatic normocephalic EOMI PERRLA sclera anicteric oral mucosa moist, Cardiovascular; regular rate and  rhythm with S1-S2 no murmur rub or gallop, Pulmonary ;clear bilaterally, Abdomen; soft, nontender nondistended bowel sounds are active there is no palpable mass or hepatosplenomegaly.  Lower abdominal incisional scar Rectal ;exam, not repeated today this was done per Dr. Brigitte Pulse on 04/30/2018, no hemorrhoids or mass noted.  Extremities ;no clubbing cyanosis or edema skin warm and dry, Neuropsych; alert and oriented, grossly nonfocal mood and affect appropriate       Assessment & Plan:   #62 62 year old white male with 3-week history of persistent rectal bleeding, with bright red blood noted with every bowel movement.  Blood appears to be separate from the stool which has appeared normal. No associated anal rectal discomfort or  abdominal pain. Labs on 04/30/2018 unremarkable with hemoglobin of 14.2.  By history bleeding is consistent with bleeding from internal hemorrhoids, though no internal hemorrhoids noted at the time of last colonoscopy 2 years ago. Rule out occult rectal or sigmoid lesion , rule out proctitis.  #2 chronic anticoagulation-Eliquis-Eliquis has been on hold over the past week per PCP #3 history of chronic atrial fibrillation 4.  Adult onset diabetes mellitus 5.  Hypertension 6.  History of recurrent diverticulitis, status post segmental resection 2007 with colostomy and then colostomy reversal 2008  Plan; patient will be scheduled for flexible sigmoidoscopy with Dr. Carlean Purl.  This will be done on 05/11/2018. Patient will stay off of Eliquis until post sigmoidoscopy. He was given a prescription for Anusol HC suppositories, to use daily at bedtime in the interim. Further plans pending results of flexible sigmoidoscopy.  I did discuss possibility of hemorrhoidal banding if bleeding is found to be secondary to internal hemorrhoids, and that this would be done at a separate setting per Dr. Carlean Purl.  Amy Genia Harold PA-C 05/07/2018   Cc: Marton Redwood, MD

## 2018-05-11 ENCOUNTER — Ambulatory Visit (AMBULATORY_SURGERY_CENTER): Payer: 59 | Admitting: Internal Medicine

## 2018-05-11 ENCOUNTER — Encounter: Payer: Self-pay | Admitting: Internal Medicine

## 2018-05-11 VITALS — BP 156/91 | HR 89 | Temp 97.7°F | Resp 16 | Ht 71.0 in | Wt 182.0 lb

## 2018-05-11 DIAGNOSIS — K649 Unspecified hemorrhoids: Secondary | ICD-10-CM | POA: Diagnosis not present

## 2018-05-11 DIAGNOSIS — K5730 Diverticulosis of large intestine without perforation or abscess without bleeding: Secondary | ICD-10-CM

## 2018-05-11 DIAGNOSIS — K625 Hemorrhage of anus and rectum: Secondary | ICD-10-CM

## 2018-05-11 MED ORDER — SODIUM CHLORIDE 0.9 % IV SOLN: 500.0000 mL | Freq: Once | INTRAVENOUS | Status: DC

## 2018-05-11 NOTE — Op Note (Addendum)
[facility] Patient Name: Angel Shelton Procedure Date: 05/11/2018 3:19 PM MRN: [id number] Endoscopist: Gatha Mayer , MD Age: 62 Referring MD:  Date of Birth: 1956/01/13 Gender: Male Account #: [id number] Procedure:                Flexible Sigmoidoscopy Indications:              Rectal hemorrhage Medicines:                None Procedure:                Pre-Anesthesia Assessment:                           - Prior to the procedure, a History and Physical                            was performed, and patient medications and                            allergies were reviewed. The patient's tolerance of                            previous anesthesia was also reviewed. The risks                            and benefits of the procedure and the sedation                            options and risks were discussed with the patient.                            All questions were answered, and informed consent                            was obtained. Prior Anticoagulants: The patient                            last took Eliquis (apixaban) 7 days prior to the                            procedure. ASA Grade Assessment: III - A patient                            with severe systemic disease. After reviewing the                            risks and benefits, the patient was deemed in                            satisfactory condition to undergo the procedure.                           After obtaining informed consent, the scope was  passed under direct vision. The Model PCF-H190DL                            [id number]) scope was introduced through the anus                            and advanced to the the sigmoid colon. The flexible                            sigmoidoscopy was accomplished without difficulty.                            The patient tolerated the procedure well. The                            quality of the bowel preparation was  adequate. Scope In: Scope Out: Findings:                 Hemorrhoids were found on perianal exam.                           External and internal hemorrhoids were found.                           Multiple diverticula were found in the sigmoid                            colon. Complications:            No immediate complications. Estimated Blood Loss:     Estimated blood loss: none. Impression:               - Hemorrhoids found on perianal exam. LL prolapsed,                            reducible                           - External and internal hemorrhoids. Cause of                            bleedin                           - Diverticulosis in the sigmoid colon.                           - No specimens collected. Recommendation:           - High fiber diet. Gatha Mayer, MD 05/11/2018 3:51:56 PM This report has been signed electronically.

## 2018-05-11 NOTE — Patient Instructions (Addendum)
I saw inflamed hemorrhoids and think the bleeding is from them.  Please use the suppositories to treat the hemorrhoids.  Try to hold it in so it dissolves and the medication contacts the hemorrhoids.  I believe you should go back on the Eliquis.  If the bleeding does not resolve or worsens, let us know.  We can consider banding the hemorrhoids but should try to see if medication will solve this first.  Other things to do or not:  1) do not lift anything that might make you strain - probably not more than 10-20 # for a couple of weeks at least 2) Take 1 tablespoon of Benefiber - an over the counter fiber supplement - every day  I appreciate the opportunity to care for you. Gatha Mayer, MD, [facility]  Hemorrhoid banding brochure given to patient. Hemorrhoid handout given to patient.  YOU HAD AN ENDOSCOPIC PROCEDURE TODAY AT [facility]:   Refer to the procedure report that was given to you for any specific questions about what was found during the examination.  If the procedure report does not answer your questions, please call your gastroenterologist to clarify.  If you requested that your care partner not be given the details of your procedure findings, then the procedure report has been included in a sealed envelope for you to review at your convenience later.  YOU SHOULD EXPECT: Some feelings of bloating in the abdomen. Passage of more gas than usual.  Walking can help get rid of the air that was put into your GI tract during the procedure and reduce the bloating. If you had a lower endoscopy (such as a colonoscopy or flexible sigmoidoscopy) you may notice spotting of blood in your stool or on the toilet paper. If you underwent a bowel prep for your procedure, you may not have a normal bowel movement for a few days.  Please Note:  You might notice some irritation and congestion in your nose or some drainage.  This is from the oxygen used during your procedure.   There is no need for concern and it should clear up in a day or so.  SYMPTOMS TO REPORT IMMEDIATELY:   Following lower endoscopy (colonoscopy or flexible sigmoidoscopy):  Excessive amounts of blood in the stool  Significant tenderness or worsening of abdominal pains  Swelling of the abdomen that is new, acute  Fever of 100F or higher For urgent or emergent issues, a gastroenterologist can be reached at any hour by calling [phone].   DIET:  We do recommend a small meal at first, but then you may proceed to your regular diet.  Drink plenty of fluids but you should avoid alcoholic beverages for 24 hours.  ACTIVITY:  You should plan to take it easy for the rest of today and you should NOT DRIVE or use heavy machinery until tomorrow (because of the sedation medicines used during the test).    FOLLOW UP: Our staff will call the number listed on your records the next business day following your procedure to check on you and address any questions or concerns that you may have regarding the information given to you following your procedure. If we do not reach you, we will leave a message.  However, if you are feeling well and you are not experiencing any problems, there is no need to return our call.  We will assume that you have returned to your regular daily activities without incident.  If any biopsies were  taken you will be contacted by phone or by letter within the next 1-3 weeks.  Please call us at [phone] if you have not heard about the biopsies in 3 weeks.    SIGNATURES/CONFIDENTIALITY: You and/or your care partner have signed paperwork which will be entered into your electronic medical record.  These signatures attest to the fact that that the information above on your After Visit Summary has been reviewed and is understood.  Full responsibility of the confidentiality of this discharge information lies with you and/or your care-partner.

## 2018-05-12 ENCOUNTER — Telehealth: Payer: Self-pay | Admitting: *Deleted

## 2018-05-12 NOTE — Telephone Encounter (Signed)
  Follow up Call-  Call back number 05/11/2018 06/07/2016  Post procedure Call Back phone  # [phone] cell  Permission to leave phone message Yes Yes  Some recent data might be hidden     Patient questions:  Do you have a fever, pain , or abdominal swelling? No. Pain Score  0 *  Have you tolerated food without any problems? Yes.    Have you been able to return to your normal activities? Yes.    Do you have any questions about your discharge instructions: Diet   No. Medications  No. Follow up visit  No.  Do you have questions or concerns about your Care? No.  Actions: * If pain score is 4 or above: No action needed, pain <4.

## 2018-07-03 DIAGNOSIS — E1140 Type 2 diabetes mellitus with diabetic neuropathy, unspecified: Secondary | ICD-10-CM | POA: Diagnosis not present

## 2018-07-03 DIAGNOSIS — E1129 Type 2 diabetes mellitus with other diabetic kidney complication: Secondary | ICD-10-CM | POA: Diagnosis not present

## 2018-07-03 DIAGNOSIS — E11319 Type 2 diabetes mellitus with unspecified diabetic retinopathy without macular edema: Secondary | ICD-10-CM | POA: Diagnosis not present

## 2019-05-01 ENCOUNTER — Other Ambulatory Visit: Payer: Self-pay | Admitting: Unknown Physician Specialty

## 2019-05-01 ENCOUNTER — Encounter: Payer: Self-pay | Admitting: Internal Medicine

## 2019-05-01 ENCOUNTER — Telehealth: Payer: Self-pay | Admitting: Unknown Physician Specialty

## 2019-05-01 DIAGNOSIS — U071 COVID-19: Secondary | ICD-10-CM

## 2019-05-01 NOTE — Telephone Encounter (Signed)
Discussed with patient about Covid symptoms and the use of bamlanivimab, a monoclonal antibody infusion for those with mild to moderate Covid symptoms and at a high risk of hospitalization.  Pt is qualified for this infusion at the [facility] infusion center due to diabetes which were addressed with the patient and are actively being managed by a [facility] provider.    After discussing the infusion's costs, potential benefits and side effects, the patient has decided to accept treatment with monoclonal antibodies.

## 2019-05-03 ENCOUNTER — Encounter (HOSPITAL_COMMUNITY): Payer: Self-pay

## 2019-05-03 ENCOUNTER — Ambulatory Visit (HOSPITAL_COMMUNITY)
Admission: RE | Admit: 2019-05-03 | Discharge: 2019-05-03 | Disposition: A | Discharge status: Home / Self Care | Payer: 59 | Source: Ambulatory Visit | Attending: Critical Care Medicine | Admitting: Critical Care Medicine

## 2019-05-03 DIAGNOSIS — U071 COVID-19: Secondary | ICD-10-CM | POA: Diagnosis present

## 2019-05-03 MED ORDER — SODIUM CHLORIDE 0.9 % IV SOLN
700.0000 mg | Freq: Once | INTRAVENOUS | Status: AC
  Administered 2019-05-03: 700 mg via INTRAVENOUS
  Filled 2019-05-03: qty 20

## 2019-05-03 MED ORDER — FAMOTIDINE IN NACL 20-0.9 MG/50ML-% IV SOLN: 20.0000 mg | Freq: Once | INTRAVENOUS | Status: DC | PRN

## 2019-05-03 MED ORDER — SODIUM CHLORIDE 0.9 % IV SOLN: INTRAVENOUS | Status: DC | PRN

## 2019-05-03 MED ORDER — EPINEPHRINE 0.3 MG/0.3ML IJ SOAJ: 0.3000 mg | Freq: Once | INTRAMUSCULAR | Status: DC | PRN

## 2019-05-03 MED ORDER — DIPHENHYDRAMINE HCL 50 MG/ML IJ SOLN: 50.0000 mg | Freq: Once | INTRAMUSCULAR | Status: DC | PRN

## 2019-05-03 MED ORDER — ALBUTEROL SULFATE HFA 108 (90 BASE) MCG/ACT IN AERS: 2.0000 | INHALATION_SPRAY | Freq: Once | RESPIRATORY_TRACT | Status: DC | PRN

## 2019-05-03 MED ORDER — METHYLPREDNISOLONE SODIUM SUCC 125 MG IJ SOLR: 125.0000 mg | Freq: Once | INTRAMUSCULAR | Status: DC | PRN

## 2019-05-03 NOTE — Progress Notes (Signed)
  Diagnosis: COVID-19  Physician:Mannam  Procedure: [facility] Infusion [facility] Med: bamlanivimab infusion - Provided patient with bamlanimivab fact sheet for patients, parents and caregivers prior to infusion.  Complications: No immediate complications noted.  Discharge: Discharged home   Baxter Hire 05/03/2019

## 2019-05-12 ENCOUNTER — Telehealth: Payer: Self-pay | Admitting: Internal Medicine

## 2019-05-12 MED ORDER — HYDROCORTISONE ACETATE 25 MG RE SUPP: RECTAL | 2 refills | Status: DC

## 2019-05-12 NOTE — Telephone Encounter (Signed)
I spoke to him 2 stools a day blood with wiping and some into bowl  Stools forming up  Off quarantine next 1-2 d   I advised stick with Tx and add sitz bath and if still going on 12/21 call back - or sooner if worse  We could evaluate and possibly band and do not need to hold Eliquis per se

## 2019-05-12 NOTE — Telephone Encounter (Signed)
Dr Gessner please advise? 

## 2019-05-12 NOTE — Telephone Encounter (Signed)
Pt was dx with Covid at the beginning of December, he states that one of the sxs he has been having since the beginning of illness was very bad diarrhea. After 5 days of continuous diarrhea he began having rectal bleeding with every bm. He has been applying anucort 25 mg suppositories but it is not helping. He wants to know what else he can do.

## 2019-09-17 DIAGNOSIS — E1129 Type 2 diabetes mellitus with other diabetic kidney complication: Secondary | ICD-10-CM | POA: Diagnosis not present

## 2019-09-17 DIAGNOSIS — I10 Essential (primary) hypertension: Secondary | ICD-10-CM | POA: Diagnosis not present

## 2019-09-17 DIAGNOSIS — E785 Hyperlipidemia, unspecified: Secondary | ICD-10-CM | POA: Diagnosis not present

## 2019-09-17 DIAGNOSIS — E1140 Type 2 diabetes mellitus with diabetic neuropathy, unspecified: Secondary | ICD-10-CM | POA: Diagnosis not present

## 2019-09-28 DIAGNOSIS — H3562 Retinal hemorrhage, left eye: Secondary | ICD-10-CM | POA: Diagnosis not present

## 2019-09-28 DIAGNOSIS — H4312 Vitreous hemorrhage, left eye: Secondary | ICD-10-CM | POA: Diagnosis not present

## 2019-09-28 DIAGNOSIS — H43392 Other vitreous opacities, left eye: Secondary | ICD-10-CM | POA: Diagnosis not present

## 2019-09-29 ENCOUNTER — Ambulatory Visit (INDEPENDENT_AMBULATORY_CARE_PROVIDER_SITE_OTHER): Payer: Medicare Other | Admitting: Ophthalmology

## 2019-09-29 ENCOUNTER — Encounter (INDEPENDENT_AMBULATORY_CARE_PROVIDER_SITE_OTHER): Payer: Self-pay | Admitting: Ophthalmology

## 2019-09-29 DIAGNOSIS — H3581 Retinal edema: Secondary | ICD-10-CM | POA: Diagnosis not present

## 2019-09-29 DIAGNOSIS — H4311 Vitreous hemorrhage, right eye: Secondary | ICD-10-CM

## 2019-09-29 DIAGNOSIS — H35033 Hypertensive retinopathy, bilateral: Secondary | ICD-10-CM

## 2019-09-29 DIAGNOSIS — H43811 Vitreous degeneration, right eye: Secondary | ICD-10-CM | POA: Diagnosis not present

## 2019-09-29 DIAGNOSIS — Z961 Presence of intraocular lens: Secondary | ICD-10-CM

## 2019-09-29 DIAGNOSIS — I10 Essential (primary) hypertension: Secondary | ICD-10-CM | POA: Diagnosis not present

## 2019-09-29 NOTE — Progress Notes (Signed)
[facility] Note  09/29/2019     CHIEF COMPLAINT Patient presents for Flashes/floaters   HISTORY OF PRESENT ILLNESS: Angel Shelton is a 64 y.o. male who presents to the clinic today for:   HPI    Flashes/floaters    In left eye.  This started 2 days ago.  Duration of 2 days.  Duration Constant.  Characterized as large.  Since onset it is gradually worsening.  Associated Symptoms Floaters.  Context:  distance vision, mid-range vision and near vision.  Treatments tried include no treatments.  I, the attending physician,  performed the HPI with the patient and updated documentation appropriately.          Comments    64 y/o male pt referred by Dr. Kathlen Mody yesterday for eval of new vit heme OS.  Saw Dr. Kathlen Mody yesterday after noticing a sudden onset floater OS the night before, which has gradually grown in size, and now takes up most of his central vision OS.  No obvious cause.  No issues OD.  Feels VA is good OU [place].  Denies pain, FOL.  No gtts.  BS 102 and A1C 5.9 30 days ago.       Last edited by Bernarda Caffey, MD on 09/29/2019  9:02 AM. (History)    pt states last Wednesday he woke up with new floaters in his vision, he states since then they have gotten worse and seemed to have spread out, he denies fol, pt states he is type 2 diabetic, but keeps it under control, he states his blood sugar this morning was 102 and last A1c was 5.9, he states he takes Eliquis bc he has A fib, he also takes medication for high blood pressure, pt had had cataract sx with Dr. Herbert Deaner   Referring physician: Hortencia Pilar, MD [place],  [place] [address]  HISTORICAL INFORMATION:   Selected notes from the MEDICAL RECORD NUMBER Referred by Dr. Quentin Ore for concern of vitreous heme and occult tear LEE:  Ocular Hx- PMH-   CURRENT MEDICATIONS: No current outpatient medications on file. (Ophthalmic Drugs)   No current facility-administered  medications for this visit. (Ophthalmic Drugs)   Current Outpatient Medications (Other)  Medication Sig  . atorvastatin (LIPITOR) 20 MG tablet Take 20 mg by mouth daily.  . Cyanocobalamin (VITAMIN B-12 PO) Take 1 tablet by mouth daily.  Marland Kitchen DILT-XR 240 MG 24 hr capsule take 1 capsule by mouth once daily  . diltiazem (CARDIZEM CD) 240 MG 24 hr capsule Take 240 mg by mouth daily.  Marland Kitchen ELIQUIS 5 MG TABS tablet Take 5 mg by mouth 2 (two) times daily.  . fenofibrate 160 MG tablet Take 160 mg by mouth daily.  . hydrocortisone (ANUSOL-HC) 25 MG suppository Use 1 suppository at bedtime for 14 days, then repeat as needed.  . JENTADUETO 2.09-998 MG TABS Take 1 tablet by mouth 2 (two) times daily.  . Lactobacillus-Inulin (PROBIOTIC DIGESTIVE SUPPORT PO) Take 1 tablet by mouth daily.  Marland Kitchen lisinopril (PRINIVIL,ZESTRIL) 20 MG tablet Take 20 mg by mouth daily.  Marland Kitchen lisinopril-hydrochlorothiazide (ZESTORETIC) 20-12.5 MG tablet Take 1 tablet by mouth daily.  . metFORMIN (GLUCOPHAGE) 1000 MG tablet Take 1,000 mg by mouth 2 (two) times daily.  . metoprolol succinate (TOPROL-XL) 100 MG 24 hr tablet Take 100 mg by mouth daily.  . Multiple Vitamin (MULTIVITAMIN WITH MINERALS) TABS tablet Take 1 tablet by mouth daily.  . Omega-3 Fatty Acids (FISH  OIL) 1200 MG CAPS Take 1,200 mg by mouth 3 (three) times daily.  Marland Kitchen VITAMIN E PO Take 1 capsule by mouth daily.   No current facility-administered medications for this visit. (Other)      REVIEW OF SYSTEMS: ROS    Positive for: Endocrine, Cardiovascular, Eyes   Negative for: Constitutional, Gastrointestinal, Neurological, Skin, Genitourinary, Musculoskeletal, HENT, Respiratory, Psychiatric, Allergic/Imm, Heme/Lymph   Last edited by Matthew Folks, COA on 09/29/2019  8:39 AM. (History)       ALLERGIES No Known Allergies  PAST MEDICAL HISTORY Past Medical History:  Diagnosis Date  . Alcohol abuse   . Diabetes mellitus without complication ([place])   . Diverticulosis    . Hyperlipidemia   . Hypertension   . Left atrial enlargement   . Persistent atrial fibrillation [facility])    Past Surgical History:  Procedure Laterality Date  . APPENDECTOMY    . CARDIAC CATHETERIZATION N/A 01/05/2015   Procedure: Left Heart Cath and Coronary Angiography;  Surgeon: Pixie Casino, MD;  Location: [facility];  Service: Cardiovascular;  Laterality: N/A;  . CARDIOVERSION N/A 11/01/2014   Procedure: CARDIOVERSION;  Surgeon: Pixie Casino, MD;  Location: [facility] ENDOSCOPY;  Service: Cardiovascular;  Laterality: N/A;  . CARDIOVERSION N/A 02/24/2015   Procedure: CARDIOVERSION;  Surgeon: Pixie Casino, MD;  Location: [facility] ENDOSCOPY;  Service: Cardiovascular;  Laterality: N/A;  . CATARACT EXTRACTION Bilateral   . COLON SURGERY     14 inches removed from diverticitis perforation  . COLONOSCOPY     Per stoma  . EYE SURGERY Bilateral    Cat Sx  . INNER EAR SURGERY    . OSTOMY    . TEE WITHOUT CARDIOVERSION N/A 11/01/2014   Procedure: TRANSESOPHAGEAL ECHOCARDIOGRAM (TEE);  Surgeon: Pixie Casino, MD;  Location: [facility] ENDOSCOPY;  Service: Cardiovascular;  Laterality: N/A;    FAMILY HISTORY Family History  Problem Relation Age of Onset  . Heart disease Father   . Stroke Father   . Diabetes Mother   . Diabetes Maternal Grandmother   . Stroke Maternal Grandmother   . Heart attack Paternal Grandfather   . Hodgkin's lymphoma Paternal Aunt   . Hodgkin's lymphoma Paternal Uncle   . Colon cancer Neg Hx   . Esophageal cancer Neg Hx   . Pancreatic cancer Neg Hx   . Prostate cancer Neg Hx   . Rectal cancer Neg Hx   . Stomach cancer Neg Hx     SOCIAL HISTORY Social History   Tobacco Use  . Smoking status: Former Smoker    Packs/day: 1.00    Years: 30.00    Pack years: 30.00    Quit date: 03/05/2014    Years since quitting: 5.5  . Smokeless tobacco: Never Used  Substance Use Topics  . Alcohol use: Yes    Alcohol/week: 14.0 standard drinks    Types: 14 Shots of liquor  per week    Comment: previously drank more, has cut back 3 per day  . Drug use: No         OPHTHALMIC EXAM:  Base Eye Exam    Visual Acuity (Snellen - Linear)      Right Left   Dist [place] 20/40 + 20/40 +2   Dist ph [place] 20/25 - 20/25 -2       Tonometry (Tonopen, 8:43 AM)      Right Left   Pressure 13 11       Pupils      Dark Light  Shape React APD   Right 3 2 Round Brisk None   Left 3 2 Round Brisk None       Visual Fields (Counting fingers)      Left Right    Full Full       Extraocular Movement      Right Left    Full, Ortho Full, Ortho       Neuro/Psych    Oriented x3: Yes   Mood/Affect: Normal       Dilation    Both eyes: 1.0% Mydriacyl, 2.5% Phenylephrine @ 8:43 AM        Slit Lamp and Fundus Exam    Slit Lamp Exam      Right Left   Lids/Lashes Dermatochalasis - upper lid, Meibomian gland dysfunction Dermatochalasis - upper lid, Meibomian gland dysfunction   Conjunctiva/Sclera White and quiet White and quiet   Cornea trace Punctate epithelial erosions 1-2+ inferior Punctate epithelial erosions   Anterior Chamber Deep and quiet Deep and quiet   Iris Round and dilated Round and dilated   Lens PC IOL in good position, trace PCO PC IOL in good position, 1+PCO   Vitreous Vitreous syneresis Vitreous syneresis, blood stained vitreous condensations, +RBC in anterior vit, Weiss ring       Fundus Exam      Right Left   Disc Mild temporal Pallor, Sharp rim Pink and Sharp   C/D Ratio 0.4 0.4   Macula Flat, Good foveal reflex, mild Retinal pigment epithelial mottling, No heme or edema hazy view, grossly attached   Vessels Vascular attenuation Vascular attenuation   Periphery Attached, no heme  Attached, inferior periphery obscured by settled VH, focal punctate heme at 1230, punctate VR tuft at 1200 -- No RT/RD on 360 scleral depression         Refraction    Manifest Refraction      Sphere Cylinder Axis Dist [facility]   Right [place] +0.25 175 20/25-2   Left [place]  Sphere  20/40+2          IMAGING AND PROCEDURES  Imaging and Procedures for @TODAY @  OCT, Retina - OU - Both Eyes       Right Eye Quality was good. Central Foveal Thickness: 256. Progression has no prior data. Findings include normal foveal contour, no IRF, no SRF (Partial PVD).   Left Eye Quality was good. Central Foveal Thickness: 267. Progression has no prior data. Findings include normal foveal contour, no IRF, no SRF (Vitreous opacities).   Notes *Images captured and stored on drive  Diagnosis / Impression:  NFP, no IRF/SRF OU OS: vitreous opacities  Clinical management:  See below  Abbreviations: NFP - Normal foveal profile. CME - cystoid macular edema. PED - pigment epithelial detachment. IRF - intraretinal fluid. SRF - subretinal fluid. EZ - ellipsoid zone. ERM - epiretinal membrane. ORA - outer retinal atrophy. ORT - outer retinal tubulation. SRHM - subretinal hyper-reflective material                 ASSESSMENT/PLAN:    ICD-10-CM   1. Posterior vitreous detachment of right eye  H43.811   2. Vitreous hemorrhage of right eye ([place])  H43.11   3. Retinal edema  H35.81 OCT, Retina - OU - Both Eyes  4. Essential hypertension  I10   5. Hypertensive retinopathy of both eyes  H35.033   6. Pseudophakia of both eyes  Z96.1     1,2. Hemorrhagic PVD OS  - Onset ~1 wk -- presented to  Dr. Kathlen Mody on 05.04.2021  - focal heme and VR tuft superiorly at 04-1229 -- No RT or RD on 360 scleral depressed exam  - Discussed findings and prognosis  - Reviewed s/s of RT/RD  - Strict return precautions for any such RT/RD signs/symptoms  - VH precautions reviewed -- minimize activities, keep head elevated, avoid ASA/NSAIDs/blood thinners as able  - F/U 1 week  3. No retinal edema on exam or OCT  4,5. Hypertensive retinopathy OU  - discussed importance of tight BP control  - monitor  6. Pseudophakia OU  - s/p CE/IOL OU (Dr. Herbert Deaner)  - beautiful surgeries, doing well  -  monitor   Ophthalmic Meds Ordered this visit:  No orders of the defined types were placed in this encounter.      Return in about 1 week (around 10/06/2019) for f/u hemorrhagic PVD OS, DFE, OCT.  There are no Patient Instructions on file for this visit.   Explained the diagnoses, plan, and follow up with the patient and they expressed understanding.  Patient expressed understanding of the importance of proper follow up care.  This document serves as a record of services personally performed by Gardiner Sleeper, MD, PhD. It was created on their behalf by Ernest Mallick, OA, an ophthalmic assistant. The creation of this record is the provider's dictation and/or activities during the visit.    Electronically signed by: Ernest Mallick, OA 05.05.2021 2:20 AM    Gardiner Sleeper, M.D., Ph.D. Diseases & Surgery of the Retina and Vitreous [facility]  I have reviewed the above documentation for accuracy and completeness, and I agree with the above. Gardiner Sleeper, M.D., Ph.D. 10/02/19 2:20 AM   Abbreviations: M myopia (nearsighted); A astigmatism; H hyperopia (farsighted); P presbyopia; Mrx spectacle prescription;  CTL contact lenses; OD right eye; OS left eye; OU both eyes  XT exotropia; ET esotropia; PEK punctate epithelial keratitis; PEE punctate epithelial erosions; DES dry eye syndrome; MGD meibomian gland dysfunction; ATs artificial tears; PFAT's preservative free artificial tears; [place] nuclear sclerotic cataract; PSC posterior subcapsular cataract; ERM epi-retinal membrane; PVD posterior vitreous detachment; RD retinal detachment; DM diabetes mellitus; DR diabetic retinopathy; NPDR non-proliferative diabetic retinopathy; PDR proliferative diabetic retinopathy; CSME clinically significant macular edema; DME diabetic macular edema; dbh dot blot hemorrhages; CWS cotton wool spot; POAG primary open angle glaucoma; C/D cup-to-disc ratio; HVF humphrey visual field; GVF goldmann  visual field; OCT optical coherence tomography; IOP intraocular pressure; BRVO Branch retinal vein occlusion; CRVO central retinal vein occlusion; CRAO central retinal artery occlusion; BRAO branch retinal artery occlusion; RT retinal tear; SB scleral buckle; PPV pars plana vitrectomy; VH Vitreous hemorrhage; PRP panretinal laser photocoagulation; IVK intravitreal kenalog; VMT vitreomacular traction; MH Macular hole;  NVD neovascularization of the disc; NVE neovascularization elsewhere; AREDS age related eye disease study; ARMD age related macular degeneration; POAG primary open angle glaucoma; EBMD epithelial/anterior basement membrane dystrophy; ACIOL anterior chamber intraocular lens; IOL intraocular lens; PCIOL posterior chamber intraocular lens; Phaco/IOL phacoemulsification with intraocular lens placement; [place] photorefractive keratectomy; LASIK laser assisted in situ keratomileusis; HTN hypertension; DM diabetes mellitus; COPD chronic obstructive pulmonary disease

## 2019-10-06 ENCOUNTER — Encounter (INDEPENDENT_AMBULATORY_CARE_PROVIDER_SITE_OTHER): Payer: Self-pay

## 2019-10-06 ENCOUNTER — Encounter (INDEPENDENT_AMBULATORY_CARE_PROVIDER_SITE_OTHER): Payer: Medicare Other | Admitting: Ophthalmology

## 2020-03-24 DIAGNOSIS — Z125 Encounter for screening for malignant neoplasm of prostate: Secondary | ICD-10-CM | POA: Diagnosis not present

## 2020-03-24 DIAGNOSIS — I10 Essential (primary) hypertension: Secondary | ICD-10-CM | POA: Diagnosis not present

## 2020-03-24 DIAGNOSIS — E1129 Type 2 diabetes mellitus with other diabetic kidney complication: Secondary | ICD-10-CM | POA: Diagnosis not present

## 2020-03-24 DIAGNOSIS — E785 Hyperlipidemia, unspecified: Secondary | ICD-10-CM | POA: Diagnosis not present

## 2020-03-30 DIAGNOSIS — R82998 Other abnormal findings in urine: Secondary | ICD-10-CM | POA: Diagnosis not present

## 2020-03-31 DIAGNOSIS — E1140 Type 2 diabetes mellitus with diabetic neuropathy, unspecified: Secondary | ICD-10-CM | POA: Diagnosis not present

## 2020-03-31 DIAGNOSIS — I10 Essential (primary) hypertension: Secondary | ICD-10-CM | POA: Diagnosis not present

## 2020-03-31 DIAGNOSIS — Z0000 Encounter for general adult medical examination without abnormal findings: Secondary | ICD-10-CM | POA: Diagnosis not present

## 2020-03-31 DIAGNOSIS — E11319 Type 2 diabetes mellitus with unspecified diabetic retinopathy without macular edema: Secondary | ICD-10-CM | POA: Diagnosis not present

## 2020-03-31 DIAGNOSIS — E1129 Type 2 diabetes mellitus with other diabetic kidney complication: Secondary | ICD-10-CM | POA: Diagnosis not present

## 2020-03-31 DIAGNOSIS — Z23 Encounter for immunization: Secondary | ICD-10-CM | POA: Diagnosis not present

## 2020-04-05 ENCOUNTER — Other Ambulatory Visit: Payer: Self-pay | Admitting: Internal Medicine

## 2020-04-06 ENCOUNTER — Other Ambulatory Visit: Payer: Self-pay | Admitting: Internal Medicine

## 2020-04-06 DIAGNOSIS — F17201 Nicotine dependence, unspecified, in remission: Secondary | ICD-10-CM

## 2020-04-07 ENCOUNTER — Other Ambulatory Visit (HOSPITAL_COMMUNITY): Payer: Self-pay | Admitting: Internal Medicine

## 2020-04-07 ENCOUNTER — Other Ambulatory Visit: Payer: Self-pay

## 2020-04-07 ENCOUNTER — Ambulatory Visit (HOSPITAL_COMMUNITY)
Admission: RE | Admit: 2020-04-07 | Discharge: 2020-04-07 | Disposition: A | Discharge status: Home / Self Care | Payer: Medicare Other | Source: Ambulatory Visit | Attending: Internal Medicine | Admitting: Internal Medicine

## 2020-04-07 DIAGNOSIS — Z0000 Encounter for general adult medical examination without abnormal findings: Secondary | ICD-10-CM

## 2020-04-26 ENCOUNTER — Ambulatory Visit
Admission: RE | Admit: 2020-04-26 | Discharge: 2020-04-26 | Disposition: A | Discharge status: Home / Self Care | Payer: Medicare Other | Source: Ambulatory Visit | Attending: Internal Medicine | Admitting: Internal Medicine

## 2020-04-26 DIAGNOSIS — I2510 Atherosclerotic heart disease of native coronary artery without angina pectoris: Secondary | ICD-10-CM | POA: Diagnosis not present

## 2020-04-26 DIAGNOSIS — J439 Emphysema, unspecified: Secondary | ICD-10-CM | POA: Diagnosis not present

## 2020-04-26 DIAGNOSIS — F17201 Nicotine dependence, unspecified, in remission: Secondary | ICD-10-CM

## 2020-04-26 DIAGNOSIS — Z87891 Personal history of nicotine dependence: Secondary | ICD-10-CM | POA: Diagnosis not present

## 2020-04-26 DIAGNOSIS — S2242XA Multiple fractures of ribs, left side, initial encounter for closed fracture: Secondary | ICD-10-CM | POA: Diagnosis not present

## 2020-05-02 ENCOUNTER — Other Ambulatory Visit: Payer: Self-pay | Admitting: Internal Medicine

## 2020-05-02 DIAGNOSIS — N289 Disorder of kidney and ureter, unspecified: Secondary | ICD-10-CM

## 2020-05-12 DIAGNOSIS — Z961 Presence of intraocular lens: Secondary | ICD-10-CM | POA: Diagnosis not present

## 2020-05-12 DIAGNOSIS — H40013 Open angle with borderline findings, low risk, bilateral: Secondary | ICD-10-CM | POA: Diagnosis not present

## 2020-05-12 DIAGNOSIS — E119 Type 2 diabetes mellitus without complications: Secondary | ICD-10-CM | POA: Diagnosis not present

## 2020-05-12 DIAGNOSIS — H43392 Other vitreous opacities, left eye: Secondary | ICD-10-CM | POA: Diagnosis not present

## 2020-05-22 ENCOUNTER — Other Ambulatory Visit: Payer: Self-pay

## 2020-05-22 ENCOUNTER — Ambulatory Visit
Admission: RE | Admit: 2020-05-22 | Discharge: 2020-05-22 | Disposition: A | Discharge status: Home / Self Care | Payer: Medicare Other | Source: Ambulatory Visit | Attending: Internal Medicine | Admitting: Internal Medicine

## 2020-05-22 DIAGNOSIS — N2889 Other specified disorders of kidney and ureter: Secondary | ICD-10-CM | POA: Diagnosis not present

## 2020-05-22 DIAGNOSIS — K8020 Calculus of gallbladder without cholecystitis without obstruction: Secondary | ICD-10-CM | POA: Diagnosis not present

## 2020-05-22 DIAGNOSIS — N289 Disorder of kidney and ureter, unspecified: Secondary | ICD-10-CM

## 2020-05-22 MED ORDER — GADOBENATE DIMEGLUMINE 529 MG/ML IV SOLN
15.0000 mL | Freq: Once | INTRAVENOUS | Status: AC | PRN
  Administered 2020-05-22: 15 mL via INTRAVENOUS

## 2020-06-16 DIAGNOSIS — D49511 Neoplasm of unspecified behavior of right kidney: Secondary | ICD-10-CM | POA: Diagnosis not present

## 2020-06-20 DIAGNOSIS — D49511 Neoplasm of unspecified behavior of right kidney: Secondary | ICD-10-CM | POA: Diagnosis not present

## 2020-09-29 DIAGNOSIS — E11319 Type 2 diabetes mellitus with unspecified diabetic retinopathy without macular edema: Secondary | ICD-10-CM | POA: Diagnosis not present

## 2020-09-29 DIAGNOSIS — E1129 Type 2 diabetes mellitus with other diabetic kidney complication: Secondary | ICD-10-CM | POA: Diagnosis not present

## 2020-09-29 DIAGNOSIS — E781 Pure hyperglyceridemia: Secondary | ICD-10-CM | POA: Diagnosis not present

## 2020-09-29 DIAGNOSIS — E785 Hyperlipidemia, unspecified: Secondary | ICD-10-CM | POA: Diagnosis not present

## 2020-10-09 DIAGNOSIS — D49511 Neoplasm of unspecified behavior of right kidney: Secondary | ICD-10-CM | POA: Diagnosis not present

## 2020-11-20 DIAGNOSIS — Z125 Encounter for screening for malignant neoplasm of prostate: Secondary | ICD-10-CM | POA: Diagnosis not present

## 2020-11-20 DIAGNOSIS — D49511 Neoplasm of unspecified behavior of right kidney: Secondary | ICD-10-CM | POA: Diagnosis not present

## 2021-01-01 DIAGNOSIS — J029 Acute pharyngitis, unspecified: Secondary | ICD-10-CM | POA: Diagnosis not present

## 2021-01-01 DIAGNOSIS — R519 Headache, unspecified: Secondary | ICD-10-CM | POA: Diagnosis not present

## 2021-01-01 DIAGNOSIS — J439 Emphysema, unspecified: Secondary | ICD-10-CM | POA: Diagnosis not present

## 2021-01-01 DIAGNOSIS — Z1152 Encounter for screening for COVID-19: Secondary | ICD-10-CM | POA: Diagnosis not present

## 2021-01-05 ENCOUNTER — Telehealth: Payer: Self-pay | Admitting: Cardiology

## 2021-01-05 NOTE — Telephone Encounter (Signed)
Error

## 2021-02-08 ENCOUNTER — Ambulatory Visit: Payer: Medicare Other | Admitting: Cardiology

## 2021-02-08 ENCOUNTER — Encounter: Payer: Self-pay | Admitting: Cardiology

## 2021-02-08 ENCOUNTER — Other Ambulatory Visit: Payer: Self-pay

## 2021-02-08 VITALS — BP 140/80 | HR 73 | Ht 71.0 in | Wt 169.0 lb

## 2021-02-08 DIAGNOSIS — I4821 Permanent atrial fibrillation: Secondary | ICD-10-CM | POA: Diagnosis not present

## 2021-02-08 DIAGNOSIS — E1169 Type 2 diabetes mellitus with other specified complication: Secondary | ICD-10-CM | POA: Diagnosis not present

## 2021-02-08 DIAGNOSIS — E785 Hyperlipidemia, unspecified: Secondary | ICD-10-CM | POA: Diagnosis not present

## 2021-02-08 DIAGNOSIS — Z01810 Encounter for preprocedural cardiovascular examination: Secondary | ICD-10-CM | POA: Diagnosis not present

## 2021-02-08 DIAGNOSIS — I10 Essential (primary) hypertension: Secondary | ICD-10-CM

## 2021-02-08 NOTE — Progress Notes (Signed)
Primary Care Provider: Ginger Organ., MD Cardiologist: Pixie Casino, MD Electrophysiologist: Thompson Grayer, MD; Roderic Palau, NP  Clinic Note: Chief Complaint  Patient presents with   New Patient (Initial Visit)    Reestablishing cardiology care-   Atrial Fibrillation    Permanent A. fib.  Rate controlled with beta-blocker, calcium channel blocker, on Eliquis. ->  Asymptomatic.   Pre-op Exam    ===================================  ASSESSMENT/PLAN   Problem List Items Addressed This Visit       Cardiology Problems   Permanent atrial fibrillation (HCC) -CHA2DS2-VASc score 4 - Primary (Chronic)    Relatively asymptomatic from being in A. fib.  Seems to be doing well with rate control on combination of diltiazem and metoprolol.  Both are cardiac protective with upcoming surgery in mind.  He is also appropriate on ACE inhibitor for diabetes, hypertension and A. Fib.  No bleeding issues on Eliquis. ->  With him being in permanent A. fib, there should be no reason for him to have to bridge perioperatively for his upcoming nephrectomy surgery.  Can simply hold Eliquis 48 to 72 hours preop and restart when safe postop.      Relevant Orders   EKG 12-Lead (Completed)   ECHOCARDIOGRAM COMPLETE   Essential hypertension (Chronic)    Blood pressure is little elevated today, but he did indicate that at home it usually is in the 120/70 range.  Blood pressure was checked shortly after he walked up the stairs to get to the clinic and then walk straight into the exam room.  Can monitor closely.  For now we will avoid additional medications-however neck step would be to consider going back to the lisinopril-HCTZ (however PCP apparently has placed him back on lisinopril alone).      Relevant Orders   EKG 12-Lead (Completed)   ECHOCARDIOGRAM COMPLETE     Other   Preop cardiovascular exam    Plan surgery is posterior approach nephrectomy.  As such, would not be intraperitoneal and  therefore would be moderate risk.  He is not as active as he used to be, but is able to achieve at least 8-10 METS of exercise without cardiac symptoms.  Limited more by his musculoskeletal symptoms.  He does have A. fib, but has had heart catheterization 6 years ago with minimal disease and has not had any anginal or heart failure symptoms.  PREOPERATIVE CARDIAC RISK ASSESSMENT   Revised Cardiac Risk Index: High Risk Surgery: no; retroperitoneal Defined as Intraperitoneal, intrathoracic or suprainguinal vascular Active CAD: no; no active symptoms, and has had ischemic evaluation with cardiac catheterization showing no significant CAD despite abnormal nuclear stress test.  Would not recommend further ischemic evaluation. CHF: no; no PND, orthopnea or edema.  No exertional dyspnea Cerebrovascular Disease: no; no prior CVA Diabetes: yes; On Insulin: no CKD (Cr >~ 2): no; creatinine 1.1 as of last check.  Total: 0.5 Estimated Risk of Adverse Outcome: 1  Estimated Risk of MI, PE, VF/VT (Cardiac Arrest), Complete Heart Block: ~1.5-3%  He will be a class I risk based on presence of diabetes but not on insulin.  3 to 6% chance of adverse cardiac outcomes, somewhat mitigated by the presence of longstanding beta-blocker dose-reducing risk to 1-3%.  We will continue beta-blocker and calcium channel blocker perioperatively.   ACC/AHA Guidelines for "Clearance": Step 1 - Need for Emergency Surgery: No: Elective If Yes - go straight to OR with perioperative surveillance Step 2 - Active Cardiac Conditions (Unstable Angina, Decompensated  HF, Significant  Arrhytmias - Complete HB, Mobitz II, Symptomatic VT or SVT, Severe Aortic Stenosis - mean gradient > 40 mmHg, Valve area < 1.0 cm2):  No: Compensated Atrial Fibrillation If Yes - Evaluate & Treat per ACC/AHA Guidelines Step 3 -  Low Risk Surgery: No: Intermediate risk If Yes --> proceed to OR If No --> Step 4 Step 4 - Functional Capacity >= 4 METS  without symptoms: Yes If Yes --> proceed to OR If No --> Step 5 Step 5 --  Clinical Risk Factors (CRF)  1-2 or more CRFs: Yes-diabetes with A. fib If Yes -- assess Surgical Risk, --  (High Risk Non-cardiac), Intraabdominal or thoracic vascular surgery --> Proceed to OR, or consider testing if it will change management. Intermediate Risk: Proceed to OR with HR control, or consider testing if it will change management  Based on intermediate risk surgery and ability to tolerate> 4 METS exercise, further testing not warranted.  Would not change management.  We are checking 2D echocardiogram simply to reassess EF for perioperative management.        Dyslipidemia associated with type 2 diabetes mellitus ([place]) (Chronic)    He is on moderate dose atorvastatin with excellent lipid control as of last October.  Should be due to have labs checked by PCP next month.  With hypertension hyperlipidemia he does have standing risk factors for CAD and had mild nonobstructive disease on cath.  LDL well within target range for his risk factors.  He is on what appears to be now combination of linagliptin and metformin (I doubt that he is on a total of 2000 mg twice daily metformin-and have therefore removed the metformin monotherapy from his list..      Relevant Orders   EKG 12-Lead (Completed)   ECHOCARDIOGRAM COMPLETE    ===================================  HPI:    Angel Shelton is a 65 y.o. male with a PMH notable for LongstandingPermanent Atrial Fibrillation (unsuccessful DCCV) who presents today for reestablishing cardiology care and likely preoperative evaluation.Arlester Marker was last seen by Dr. Debara Pickett in October 2016.  He had been referred to the A. fib clinic and [name]. He was last seen on August 19, 2016 by Roderic Palau, MD of the [facility].  He had been evaluated by [name], and then he decided to stop flecainide for rhythm control and allow for rate control only since  he was relatively asymptomatic.  Was not felt to be an ablation candidate.  Was noted to still be in A. fib, but indicated that it did not bother him as well.  Was continued on Toprol 100 mg daily for rate control and Eliquis 5 mg twice daily for OAC (CHA2DS2-VASc 4 at least 2).  Plan was to follow-up in the A. fib clinic as needed.  Recent Hospitalizations: None  Reviewed  CV studies:    The following studies were reviewed today: (if available, images/films reviewed: From Epic Chart or Care Everywhere) TEE 11/01/2014: Mild concentric LVH.  EF 55 to 60%.  Normal wall motion.  Grade 3 aortic atheroma.  No source of cardiac emboli.  -->  Followed by unsuccessful DCCV. Myoview 12/08/2014: FALSE POSITIVE: Read as INTERMEDIATE RISK.  Partially reversible medium size moderate severity defect in the basal--mid-apical inferoseptal-inferior and apical walls.  Not gated due to A. fib. ->  Referred for cath- NON-OBSTRUCTIVE CAD Cardiac Cath 01/05/2015: (For Abnormal Nuclear Stress Test) mild luminal irregularities of the LCx with mild areas of ectasia.  Calcified ostial D1 mild stenosis. ->  Mild/nonobstructive CAD. CT of the chest 04/26/2020: 1. Lung-RADS 2s, benign appearance or behavior.2. Aortic Atherosclerosis (ICD10-I70.0) and Emphysema (ICD10-J43.9).3. Coronary artery calcifications.  Interval History:   Angel Shelton returns here today for reestablishing cardiology care preop evaluation for upcoming partial nephrectomy in December.  Kadrian has clearly done pretty well over the last several years because he has not been coming back for cardiology evaluations.  He really denies any sensation whatsoever being in A. fib.  He says every now and then he will feel his heart skipping beats, and if he is active and exerting himself, he may feel it beating a little regular when it goes fast, but has no untoward symptoms from it.  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  He does not do routine exercise but tries to walk most days.  Limited by knee pain more so than any other symptoms.  He is able to go at least 3-4 blocks or up and down a Flight of steps without any chest pain or dyspnea- > at least 8-10 METS.  He has not had any bleeding issues with being on DOAC.  He has potentially had some microscopic hematuria but is not aware of any macroscopic bleeding.  He denies any sense of fatigue or exercise intolerance, again limited more by musculoskeletal pains than fatigue, dyspnea or chest pain.  CV Review of Symptoms (Summary) Cardiovascular ROS: no chest pain or dyspnea on exertion positive for - irregular heartbeat and only fleeting little episodes.  Nothing to suggest that he is since his being in A. fib. negative for - edema, orthopnea, paroxysmal nocturnal dyspnea, rapid heart rate, shortness of breath, or lightheadedness, dizziness or wooziness, syncope/near syncope or TIA/amaurosis fugax, claudication; exercise intolerance  REVIEWED OF SYSTEMS   Review of Systems  Constitutional:  Positive for weight loss (Appetite has gone down, but does not seem to be unhealthy). Negative for malaise/fatigue.  HENT:  Negative for congestion and nosebleeds.   Respiratory:  Negative for cough and shortness of breath.   Cardiovascular:        Per HPI  Gastrointestinal:  Negative for abdominal pain, blood in stool and melena.  Genitourinary:  Negative for hematuria (Not visible).  Musculoskeletal:  Positive for joint pain (Knee pain; also has some hand arthritis).  Neurological:  Positive for tingling (Pedal neuropathy). Negative for dizziness, focal weakness and loss of consciousness.  Endo/Heme/Allergies:  Does not bruise/bleed easily.  Psychiatric/Behavioral:  Negative for depression and memory loss. The patient is not nervous/anxious and does not have insomnia.    I have reviewed and (if needed)  personally updated the patient's problem list, medications, allergies, past medical and surgical history, social and family history.   PAST MEDICAL HISTORY   Past Medical History:  Diagnosis Date   Alcohol abuse    Diabetes mellitus without complication ([place])    Diverticulosis    Hyperlipidemia    Hypertension    Left atrial enlargement    Persistent atrial fibrillation (HCC)    CHA2DS2-VASc score 4 (age-68, HTN-1, DM 2-1, aortic plaque)    PAST SURGICAL HISTORY   Past Surgical History:  Procedure Laterality Date   APPENDECTOMY     CARDIAC CATHETERIZATION N/A 01/05/2015   Procedure: Left Heart Cath and Coronary Angiography;  Surgeon: Pixie Casino, MD;  Location: [facility];  Service: Cardiovascular;; (  For Abnormal Nuclear Stress Test) mild luminal irregularities of the LCx with mild areas of ectasia.  Calcified ostial D1 mild stenosis. ->  Mild/nonobstructive CAD.   CARDIOVERSION N/A 11/01/2014   Procedure: CARDIOVERSION;  Surgeon: Pixie Casino, MD;  Location: [facility];  Service: Cardiovascular;  Laterality: N/A;   CARDIOVERSION N/A 02/24/2015   Procedure: CARDIOVERSION;  Surgeon: Pixie Casino, MD;  Location: [facility];  Service: Cardiovascular;  Laterality: N/A;   CATARACT EXTRACTION Bilateral    COLON SURGERY     14 inches removed from diverticitis perforation   COLONOSCOPY     Per stoma   EYE SURGERY Bilateral    Cat Sx   INNER EAR SURGERY     NM MYOVIEW LTD  12/08/2014   FALSE POSITIVE: Read asINTERMEDIATE RISK.  Partially reversible medium size moderate severity defect in the basal--mid-apical inferoseptal-inferior and apical walls.  Not gated due to A. fib. ->  Referred for cath- NON-OBSTRUCTIVE CAD   OSTOMY     TEE WITHOUT CARDIOVERSION N/A 11/01/2014   Procedure: TRANSESOPHAGEAL ECHOCARDIOGRAM (TEE) -unsuccesful DCCV;  Surgeon: Pixie Casino, MD;  Location: [facility];  Service: Cardiovascular;; Mild concentric LVH.  EF 55 to 60%.  Normal wall  motion.  Grade 3 aortic atheroma.  No source of cardiac emboli.  -->  Followed by unsuccessful DCCV.    Immunization History  Administered Date(s) Administered   Zoster, Live 12/04/2014    MEDICATIONS/ALLERGIES   Current Meds  Medication Sig   atorvastatin (LIPITOR) 20 MG tablet Take 20 mg by mouth daily.   Cyanocobalamin (VITAMIN B-12 PO) Take 1 tablet by mouth daily.   DILT-XR 240 MG 24 hr capsule take 1 capsule by mouth once daily   ELIQUIS 5 MG TABS tablet Take 5 mg by mouth 2 (two) times daily.   fenofibrate 160 MG tablet Take 160 mg by mouth daily.   hydrocortisone (ANUSOL-HC) 25 MG suppository Use 1 suppository at bedtime for 14 days, then repeat as needed.   JENTADUETO 2.09-998 MG TABS Take 1 tablet by mouth 2 (two) times daily.   Lactobacillus-Inulin (PROBIOTIC DIGESTIVE SUPPORT PO) Take 1 tablet by mouth daily.   lisinopril (PRINIVIL,ZESTRIL) 20 MG tablet Take 20 mg by mouth daily.   metoprolol succinate (TOPROL-XL) 100 MG 24 hr tablet Take 100 mg by mouth daily.   Multiple Vitamin (MULTIVITAMIN WITH MINERALS) TABS tablet Take 1 tablet by mouth daily.   Omega-3 Fatty Acids (FISH OIL) 1200 MG CAPS Take 1,200 mg by mouth 3 (three) times daily.   VITAMIN E PO Take 1 capsule by mouth daily.    No Known Allergies  SOCIAL HISTORY/FAMILY HISTORY   Reviewed in Epic:  Pertinent findings:  Social History   Tobacco Use   Smoking status: Former    Packs/day: 1.00    Years: 30.00    Pack years: 30.00    Types: Cigarettes    Quit date: 03/05/2014    Years since quitting: 6.9   Smokeless tobacco: Never  Substance Use Topics   Alcohol use: Yes    Alcohol/week: 14.0 standard drinks    Types: 14 Shots of liquor per week    Comment: previously drank more, has cut back 3 per day   Drug use: No   Social History   Social History Narrative   Works in the Hospital doctor    OBJCTIVE -[place], EKG, labs   Wt Readings from Last 3 Encounters:  02/08/21 169 lb (76.7  kg)  05/11/18 182 lb (82.6  kg)  05/07/18 182 lb 6.4 oz (82.7 kg)    Physical Exam: BP 140/80 (BP Location: Left Arm)   Pulse 73   Ht '5\' 11"'$  (1.803 m)   Wt 169 lb (76.7 kg)   SpO2 98%   BMI 23.57 kg/m  Physical Exam Vitals reviewed.  Constitutional:      General: He is not in acute distress.    Appearance: He is normal weight.     Comments: Well-nourished and well-groomed, but does appear mildly chronically ill-appearing  HENT:     Head: Normocephalic and atraumatic.  Neck:     Vascular: No carotid bruit, hepatojugular reflux or JVD ([place] A waves noted).  Cardiovascular:     Rate and Rhythm: Rhythm irregularly irregular.     Chest Wall: PMI is not displaced.     Pulses: Decreased pulses (1+ pedal pulses were palpable.).     Heart sounds: S1 normal and S2 normal. Murmur (Cannot exclude soft 1/6 SEM) heard.    No friction rub. No gallop.  Pulmonary:     Effort: Pulmonary effort is normal. No respiratory distress.     Breath sounds: Normal breath sounds. No wheezing, rhonchi or rales.  Chest:     Chest wall: No tenderness.  Abdominal:     General: Abdomen is flat. Bowel sounds are normal. There is no distension.     Palpations: Abdomen is soft. There is no mass (No HSM or bruit).     Tenderness: There is no abdominal tenderness.  Musculoskeletal:        General: No swelling. Normal range of motion.     Cervical back: Normal range of motion.  Skin:    General: Skin is warm and dry.     Coloration: Skin is not jaundiced or pale.     Comments: Mild pedal rubor, but not tender not warm.  Neurological:     Mental Status: He is alert.     Gait: Gait abnormal (Somewhat slow and shuffling gait).  Psychiatric:        Mood and Affect: Mood normal.        Behavior: Behavior normal.        Thought Content: Thought content normal.        Judgment: Judgment normal.     Adult ECG Report  Rate: 73 ;  Rhythm: atrial fibrillation, premature ventricular contractions (PVC), and  otherwise normal axis, intervals and durations. ;   Narrative Interpretation: Stable EKG.  Recent Labs:   03/24/2020: TC 111, TG 165, HDL 44, LDL 34.  A1c 5.7. No results found for: CHOL, HDL, LDLCALC, LDLDIRECT, TRIG, CHOLHDL -> due to be rechecked in October  No results found for: HGBA1C Lab Results  Component Value Date   TSH 3.485 02/17/2015    ==================================================  COVID-19 Education: The signs and symptoms of COVID-19 were discussed with the patient and how to seek care for testing (follow up with PCP or arrange E-visit).    I spent a total of 30 minutes with the patient spent in direct patient consultation.  Additional time spent with chart review  / charting (studies, outside notes, etc): 22 min Total Time: 52 min  Current medicines are reviewed at length with the patient today.  (+/- concerns) none  This visit occurred during the SARS-CoV-2 public health emergency.  Safety protocols were in place, including screening questions prior to the visit, additional usage of staff PPE, and extensive cleaning of exam room while observing appropriate contact time as indicated for  disinfecting solutions.  Notice: This dictation was prepared with Dragon dictation along with smaller phrase technology. Any transcriptional errors that result from this process are unintentional and may not be corrected upon review.  Patient Instructions / Medication Changes & Studies & Tests Ordered   Patient Instructions  Medication Instructions:  Continue same medications   Lab Work: None ordered   Testing/Procedures: Echo   Follow-Up: At [facility], you and your health needs are our priority.  As part of our continuing mission to provide you with exceptional heart care, we have created designated Provider Care Teams.  These Care Teams include your primary Cardiologist (physician) and Advanced Practice Providers (APPs -  Physician Assistants and Nurse  Practitioners) who all work together to provide you with the care you need, when you need it.  We recommend signing up for the patient portal called "MyChart".  Sign up information is provided on this After Visit Summary.  MyChart is used to connect with patients for Virtual Visits (Telemedicine).  Patients are able to view lab/test results, encounter notes, upcoming appointments, etc.  Non-urgent messages can be sent to your provider as well.   To learn more about what you can do with MyChart, go to [email or web address].     Your next appointment:  1 year  Call in May to schedule Sept appointment    The format for your next appointment: Office    Provider: Dr.Suprena Travaglini     Studies Ordered:   Orders Placed This Encounter  Procedures   EKG 12-Lead   ECHOCARDIOGRAM COMPLETE      Glenetta Hew, M.D., M.S. Interventional Cardiologist   Pager # [phone] Phone # [phone] [address] [place], [place] [address]   Thank you for choosing Heartcare at [facility]!!

## 2021-02-08 NOTE — Patient Instructions (Signed)
Medication Instructions:  Continue same medications   Lab Work: None ordered   Testing/Procedures: Echo   Follow-Up: At [facility], you and your health needs are our priority.  As part of our continuing mission to provide you with exceptional heart care, we have created designated Provider Care Teams.  These Care Teams include your primary Cardiologist (physician) and Advanced Practice Providers (APPs -  Physician Assistants and Nurse Practitioners) who all work together to provide you with the care you need, when you need it.  We recommend signing up for the patient portal called "MyChart".  Sign up information is provided on this After Visit Summary.  MyChart is used to connect with patients for Virtual Visits (Telemedicine).  Patients are able to view lab/test results, encounter notes, upcoming appointments, etc.  Non-urgent messages can be sent to your provider as well.   To learn more about what you can do with MyChart, go to [email or web address].     Your next appointment:  1 year  Call in May to schedule Sept appointment    The format for your next appointment: Office    Provider: Dr.Harding

## 2021-02-09 ENCOUNTER — Encounter: Payer: Self-pay | Admitting: Cardiology

## 2021-02-09 DIAGNOSIS — Z01810 Encounter for preprocedural cardiovascular examination: Secondary | ICD-10-CM | POA: Insufficient documentation

## 2021-02-09 NOTE — Assessment & Plan Note (Addendum)
Blood pressure is little elevated today, but he did indicate that at home it usually is in the 120/70 range.  Blood pressure was checked shortly after he walked up the stairs to get to the clinic and then walk straight into the exam room.  Can monitor closely.  For now we will avoid additional medications-however neck step would be to consider going back to the lisinopril-HCTZ (however PCP apparently has placed him back on lisinopril alone).

## 2021-02-09 NOTE — Assessment & Plan Note (Signed)
Relatively asymptomatic from being in A. fib.  Seems to be doing well with rate control on combination of diltiazem and metoprolol.  Both are cardiac protective with upcoming surgery in mind.  He is also appropriate on ACE inhibitor for diabetes, hypertension and A. Fib.  No bleeding issues on Eliquis. ->  With him being in permanent A. fib, there should be no reason for him to have to bridge perioperatively for his upcoming nephrectomy surgery.  Can simply hold Eliquis 48 to 72 hours preop and restart when safe postop.

## 2021-02-09 NOTE — Assessment & Plan Note (Addendum)
He is on moderate dose atorvastatin with excellent lipid control as of last October.  Should be due to have labs checked by PCP next month.  With hypertension hyperlipidemia he does have standing risk factors for CAD and had mild nonobstructive disease on cath.  LDL well within target range for his risk factors.  He is on what appears to be now combination of linagliptin and metformin (I doubt that he is on a total of 2000 mg twice daily metformin-and have therefore removed the metformin monotherapy from his list..

## 2021-02-10 NOTE — Assessment & Plan Note (Signed)
Plan surgery is posterior approach nephrectomy.  As such, would not be intraperitoneal and therefore would be moderate risk.  He is not as active as he used to be, but is able to achieve at least 8-10 METS of exercise without cardiac symptoms.  Limited more by his musculoskeletal symptoms.  He does have A. fib, but has had heart catheterization 6 years ago with minimal disease and has not had any anginal or heart failure symptoms.  PREOPERATIVE CARDIAC RISK ASSESSMENT   Revised Cardiac Risk Index:  High Risk Surgery: no; retroperitoneal  Defined as Intraperitoneal, intrathoracic or suprainguinal vascular  Active CAD: no; no active symptoms, and has had ischemic evaluation with cardiac catheterization showing no significant CAD despite abnormal nuclear stress test.  Would not recommend further ischemic evaluation.  CHF: no; no PND, orthopnea or edema.  No exertional dyspnea  Cerebrovascular Disease: no; no prior CVA  Diabetes: yes; On Insulin: no  CKD (Cr >~ 2): no; creatinine 1.1 as of last check.  Total: 0.5 Estimated Risk of Adverse Outcome: 1  Estimated Risk of MI, PE, VF/VT (Cardiac Arrest), Complete Heart Block: ~1.5-3%  He will be a class I risk based on presence of diabetes but not on insulin.  3 to 6% chance of adverse cardiac outcomes, somewhat mitigated by the presence of longstanding beta-blocker dose-reducing risk to 1-3%.  We will continue beta-blocker and calcium channel blocker perioperatively.   ACC/AHA Guidelines for "Clearance":  Step 1 - Need for Emergency Surgery: No: Elective  If Yes - go straight to OR with perioperative surveillance  Step 2 - Active Cardiac Conditions (Unstable Angina, Decompensated HF, Significant  Arrhytmias - Complete HB, Mobitz II, Symptomatic VT or SVT, Severe Aortic Stenosis - mean gradient > 40 mmHg, Valve area < 1.0 cm2):   No: Compensated Atrial Fibrillation  If Yes - Evaluate & Treat per ACC/AHA Guidelines  Step 3 -  Low Risk  Surgery: No: Intermediate risk  If Yes --> proceed to OR  If No --> Step 4  Step 4 - Functional Capacity >= 4 METS without symptoms: Yes  If Yes --> proceed to OR  If No --> Step 5  Step 5 --  Clinical Risk Factors (CRF)   1-2 or more CRFs: Yes-diabetes with A. fib  If Yes -- assess Surgical Risk, --   (High Risk Non-cardiac), Intraabdominal or thoracic vascular surgery --> Proceed to OR, or consider testing if it will change management.  Intermediate Risk: Proceed to OR with HR control, or consider testing if it will change management  Based on intermediate risk surgery and ability to tolerate> 4 METS exercise, further testing not warranted.  Would not change management.  We are checking 2D echocardiogram simply to reassess EF for perioperative management.

## 2021-02-28 ENCOUNTER — Other Ambulatory Visit (HOSPITAL_COMMUNITY): Payer: Medicare Other

## 2021-03-14 DIAGNOSIS — D49511 Neoplasm of unspecified behavior of right kidney: Secondary | ICD-10-CM | POA: Diagnosis not present

## 2021-03-19 ENCOUNTER — Ambulatory Visit (HOSPITAL_COMMUNITY): Payer: Medicare Other

## 2021-03-21 DIAGNOSIS — K8020 Calculus of gallbladder without cholecystitis without obstruction: Secondary | ICD-10-CM | POA: Diagnosis not present

## 2021-03-21 DIAGNOSIS — K5730 Diverticulosis of large intestine without perforation or abscess without bleeding: Secondary | ICD-10-CM | POA: Diagnosis not present

## 2021-03-21 DIAGNOSIS — N200 Calculus of kidney: Secondary | ICD-10-CM | POA: Diagnosis not present

## 2021-03-21 DIAGNOSIS — D49511 Neoplasm of unspecified behavior of right kidney: Secondary | ICD-10-CM | POA: Diagnosis not present

## 2021-03-21 DIAGNOSIS — N281 Cyst of kidney, acquired: Secondary | ICD-10-CM | POA: Diagnosis not present

## 2021-03-26 DIAGNOSIS — E11319 Type 2 diabetes mellitus with unspecified diabetic retinopathy without macular edema: Secondary | ICD-10-CM | POA: Diagnosis not present

## 2021-03-26 DIAGNOSIS — E785 Hyperlipidemia, unspecified: Secondary | ICD-10-CM | POA: Diagnosis not present

## 2021-03-26 DIAGNOSIS — Z125 Encounter for screening for malignant neoplasm of prostate: Secondary | ICD-10-CM | POA: Diagnosis not present

## 2021-03-26 DIAGNOSIS — I10 Essential (primary) hypertension: Secondary | ICD-10-CM | POA: Diagnosis not present

## 2021-03-26 DIAGNOSIS — N1831 Chronic kidney disease, stage 3a: Secondary | ICD-10-CM | POA: Diagnosis not present

## 2021-03-26 DIAGNOSIS — E1129 Type 2 diabetes mellitus with other diabetic kidney complication: Secondary | ICD-10-CM | POA: Diagnosis not present

## 2021-03-29 ENCOUNTER — Other Ambulatory Visit: Payer: Self-pay

## 2021-03-29 ENCOUNTER — Ambulatory Visit (HOSPITAL_COMMUNITY): Payer: Medicare Other | Attending: Cardiovascular Disease

## 2021-03-29 DIAGNOSIS — E785 Hyperlipidemia, unspecified: Secondary | ICD-10-CM | POA: Diagnosis not present

## 2021-03-29 DIAGNOSIS — I10 Essential (primary) hypertension: Secondary | ICD-10-CM | POA: Insufficient documentation

## 2021-03-29 DIAGNOSIS — I4821 Permanent atrial fibrillation: Secondary | ICD-10-CM | POA: Diagnosis not present

## 2021-03-29 DIAGNOSIS — E1169 Type 2 diabetes mellitus with other specified complication: Secondary | ICD-10-CM | POA: Diagnosis not present

## 2021-03-30 LAB — ECHOCARDIOGRAM COMPLETE: S' Lateral: 2.8 cm

## 2021-04-02 DIAGNOSIS — E785 Hyperlipidemia, unspecified: Secondary | ICD-10-CM | POA: Diagnosis not present

## 2021-04-02 DIAGNOSIS — Z0000 Encounter for general adult medical examination without abnormal findings: Secondary | ICD-10-CM | POA: Diagnosis not present

## 2021-04-02 DIAGNOSIS — E1129 Type 2 diabetes mellitus with other diabetic kidney complication: Secondary | ICD-10-CM | POA: Diagnosis not present

## 2021-04-02 DIAGNOSIS — E11319 Type 2 diabetes mellitus with unspecified diabetic retinopathy without macular edema: Secondary | ICD-10-CM | POA: Diagnosis not present

## 2021-04-03 DIAGNOSIS — I10 Essential (primary) hypertension: Secondary | ICD-10-CM | POA: Diagnosis not present

## 2021-04-03 DIAGNOSIS — R82998 Other abnormal findings in urine: Secondary | ICD-10-CM | POA: Diagnosis not present

## 2021-04-04 ENCOUNTER — Other Ambulatory Visit: Payer: Self-pay | Admitting: Internal Medicine

## 2021-04-04 DIAGNOSIS — F17201 Nicotine dependence, unspecified, in remission: Secondary | ICD-10-CM

## 2021-04-09 ENCOUNTER — Other Ambulatory Visit: Payer: Self-pay | Admitting: Urology

## 2021-04-09 ENCOUNTER — Telehealth: Payer: Self-pay | Admitting: Cardiology

## 2021-04-09 DIAGNOSIS — I700 Atherosclerosis of aorta: Secondary | ICD-10-CM

## 2021-04-09 DIAGNOSIS — D49511 Neoplasm of unspecified behavior of right kidney: Secondary | ICD-10-CM | POA: Diagnosis not present

## 2021-04-09 NOTE — Telephone Encounter (Signed)
   [facility] Pre-operative Risk Assessment    Patient Name: Angel Shelton  DOB: 05/31/1955 MRN: [id number]  HEARTCARE STAFF:  - IMPORTANT!!!!!! Under Visit Info/Reason for Call, type in Other and utilize the format Clearance MM/DD/YY or Clearance TBD. Do not use dashes or single digits. - Please review there is not already an duplicate clearance open for this procedure. - If request is for dental extraction, please clarify the # of teeth to be extracted. - If the patient is currently at the dentist's office, call Pre-Op Callback Staff (MA/nurse) to input urgent request.  - If the patient is not currently in the dentist office, please route to the Pre-Op pool.  Request for surgical clearance:  What type of surgery is being performed? Partial nephrectomy   When is this surgery scheduled? 05/02/2021  What type of clearance is required (medical clearance vs. Pharmacy clearance to hold med vs. Both)? Both  Are there any medications that need to be held prior to surgery and how long? Eliquis, 48 hrs prior  Practice name and name of physician performing surgery? [facility], Dr. Alexis Frock  What is the office phone number? [phone]   7.   What is the office fax number? [phone]  8.   Anesthesia type (None, local, MAC, general) ? general   Selena Zobro 04/09/2021, 3:31 PM  _________________________________________________________________   (provider comments below)

## 2021-04-09 NOTE — Progress Notes (Signed)
Requested surgical orders with Dr. Zettie Pho office.

## 2021-04-10 DIAGNOSIS — I700 Atherosclerosis of aorta: Secondary | ICD-10-CM | POA: Insufficient documentation

## 2021-04-10 NOTE — Telephone Encounter (Signed)
   Name: Angel Shelton DOB: 1956/02/20  MRN: [id number]  Primary Cardiologist: Pixie Casino, MD  Chart reviewed as part of pre-operative protocol coverage.   Patient was seen by Dr. Ellyn Hack 02/08/21 and preoperative risk assessment was completed at that time. From Dr. Allison Quarry note" "Based on intermediate risk surgery and ability to tolerate> 4 METS exercise, further testing not warranted.  Would not change management.  We are checking 2D echocardiogram simply to reassess EF for perioperative management."  Echocardiogram 03/29/21 demonstrated normal EF: 1. Left ventricular ejection fraction, by estimation, is 55 to 60%. The  left ventricle has normal function. The left ventricle has no regional  wall motion abnormalities. There is moderate left ventricular hypertrophy  of the basal-septal segment. Left  ventricular diastolic parameters are indeterminate.   2. Right ventricular systolic function is normal. The right ventricular  size is normal. There is normal pulmonary artery systolic pressure.   3. Left atrial size was severely dilated.   4. The mitral valve is normal in structure. Mild mitral valve  regurgitation. No evidence of mitral stenosis.   5. The aortic valve is tricuspid. There is mild calcification of the  aortic valve. There is mild thickening of the aortic valve. Aortic valve  regurgitation is not visualized. Mild aortic valve sclerosis is present,  with no evidence of aortic valve  stenosis.   6. The inferior vena cava is normal in size with greater than 50%  respiratory variability, suggesting right atrial pressure of 3 mmHg.  Patient was contacted 04/10/2021 in reference to pre-operative risk assessment for pending surgery as outlined below.    Since last seen, Angel Shelton has done well without chest pain or shortness of breath.   Recommendations: Based on ACC/AHA guidelines, the patient is at acceptable risk for the planned procedure and may proceed  without further cardiovascular testing.  Apixaban (Eliquis) can be held for 2-3 days prior to the procedure.  It should be resumed postoperatively when it is felt to be safe.   Please call with questions. Richardson Dopp, PA-C 04/10/2021, 4:25 PM

## 2021-04-10 NOTE — Telephone Encounter (Signed)
Will route to PharmD for rec's re: holding anticoagulation.  Richardson Dopp, PA-C    04/10/2021 8:41 AM

## 2021-04-10 NOTE — Telephone Encounter (Signed)
Patient with diagnosis of afib on Eliquis for anticoagulation.    Procedure: partial nephrectomy Date of procedure: 05/02/21  CHA2DS2-VASc Score = 4  This indicates a 4.8% annual risk of stroke. The patient's score is based upon: CHF History: 0 HTN History: 1 Diabetes History: 1 Stroke History: 0 Vascular Disease History: 1 Age Score: 1 Gender Score: 0   CrCl 52mL/min  Per office protocol, patient can hold Eliquis for 2-3 days prior to procedure.

## 2021-04-10 NOTE — Telephone Encounter (Signed)
Notes faxed to surgeon. This phone note will be removed from the preop pool. Richardson Dopp, PA-C  04/10/2021 4:32 PM

## 2021-04-12 NOTE — Patient Instructions (Addendum)
DUE TO COVID-19 ONLY ONE VISITOR IS ALLOWED TO COME WITH YOU AND STAY IN THE WAITING ROOM ONLY DURING PRE OP AND PROCEDURE DAY OF SURGERY IF YOU ARE GOING HOME AFTER SURGERY. IF YOU ARE SPENDING THE NIGHT 2 PEOPLE MAY VISIT WITH YOU IN YOUR PRIVATE ROOM AFTER SURGERY UNTIL VISITING  HOURS ARE OVER AT 800 PM AND 1  VISITOR  MAY  SPEND THE NIGHT.   YOU NEED TO HAVE A COVID 19 TEST ON_12/5____ THIS TEST MUST BE DONE BEFORE SURGERY,  COVID TESTING SITE  IS LOCATED AT [place], [place]. REMAIN IN YOUR CAR THIS IS A DRIVE UP TEST. AFTER YOUR COVID TEST PLEASE WEAR A MASK OUT IN PUBLIC AND SOCIAL DISTANCE AND [place] YOUR HANDS FREQUENTLY, ALSO ASK ALL YOUR CLOSE CONTACT PERSONS TO WEAR A MASK AND SOCIAL DISTANCE AND [place] THEIR HANDS FREQUENTLY ALSO.               Arlester Marker     Your procedure is scheduled on: 05/02/21   Report to [facility] Main  Entrance   Report to admitting at   6:15 AM     Call this number if you have problems the morning of surgery [phone]   Follow instructions for diet and bowel prep from Dr. Zettie Pho office.  Have a clear liquid diet the day before surgery   CLEAR LIQUID DIET   Foods Allowed                                                                     Foods Excluded  Coffee and tea, regular and decaf  No milk or creamer                                                             liquids that you cannot  Plain Jell-O any favor except red or purple                                           see through such as: Fruit ices (not with fruit pulp)                                     milk, soups, orange juice  Iced Popsicles                                    All solid food Carbonated beverages, regular and diet                                    Cranberry, grape and apple juices Sports drinks like Gatorade Lightly seasoned clear broth or consume(fat free) Sugar No food or drink after midnight  BRUSH YOUR TEETH MORNING OF  SURGERY AND RINSE YOUR MOUTH OUT, NO CHEWING GUM CANDY OR MINTS.     Take these medicines the morning of surgery with A SIP OF WATER: Gabapentin, Metoprolol, Diltiazem.         Stop taking _Eliquis__________on __12/4________as instructed by __Manny___________.  Stop taking ____________as directed by your Surgeon/Cardiologist.  Contact your Surgeon/Cardiologist for instructions on Anticoagulant Therapy prior to surgery.    How to Manage Your Diabetes Before and After Surgery  Why is it important to control my blood sugar before and after surgery? Improving blood sugar levels before and after surgery helps healing and can limit problems. A way of improving blood sugar control is eating a healthy diet by:  Eating less sugar and carbohydrates  Increasing activity/exercise  Talking with your doctor about reaching your blood sugar goals High blood sugars (greater than 180 mg/dL) can raise your risk of infections and slow your recovery, so you will need to focus on controlling your diabetes during the weeks before surgery. Make sure that the doctor who takes care of your diabetes knows about your planned surgery including the date and location.  How do I manage my blood sugar before surgery? Check your blood sugar at least 4 times a day, starting 2 days before surgery, to make sure that the level is not too high or low. Check your blood sugar the morning of your surgery when you wake up and every 2 hours until you get to the Short Stay unit. If your blood sugar is less than 70 mg/dL, you will need to treat for low blood sugar: Do not take insulin. Treat a low blood sugar (less than 70 mg/dL) with  cup of clear juice (cranberry or apple), 4 glucose tablets, OR glucose gel. Recheck blood sugar in 15 minutes after treatment (to make sure it is greater than 70 mg/dL). If your blood sugar is not greater than 70 mg/dL on recheck, call [phone] for further instructions. Report your blood  sugar to the short stay nurse when you get to Short Stay.  If you are admitted to the hospital after surgery: Your blood sugar will be checked by the staff and you will probably be given insulin after surgery (instead of oral diabetes medicines) to make sure you have good blood sugar levels. The goal for blood sugar control after surgery is 80-180 mg/dL.   WHAT DO I DO ABOUT MY DIABETES MEDICATION?  Do not take oral diabetes medicines (pills) the morning of surgery..                     You may not have any metal on your body including              piercings  Do not wear jewelry lotions, powders or deodorant             Men may shave face and neck.   Do not bring valuables to the hospital. [place].  Contacts, dentures or bridgework may not be worn into surgery.  Leave suitcase in the car. After surgery it may be brought to your room.                  Please read over the following fact sheets you were given: _____________________________________________________________________             [facility] -  Preparing for Surgery Before surgery, you can play an important role.  Because skin is not sterile, your skin needs to be as free of germs as possible.  You can reduce the number of germs on your skin by washing with CHG (chlorahexidine gluconate) soap before surgery.  CHG is an antiseptic cleaner which kills germs and bonds with the skin to continue killing germs even after washing. Please DO NOT use if you have an allergy to CHG or antibacterial soaps.  If your skin becomes reddened/irritated stop using the CHG and inform your nurse when you arrive at Short Stay.   You may shave your face/neck. Please follow these instructions carefully:  1.  Shower with CHG Soap the night before surgery and the  morning of Surgery.  2.  If you choose to wash your hair, wash your hair first as usual with your  normal  shampoo.  3.  After you  shampoo, rinse your hair and body thoroughly to remove the  shampoo.                            4.  Use CHG as you would any other liquid soap.  You can apply chg directly  to the skin and wash                       Gently with a scrungie or clean washcloth.  5.  Apply the CHG Soap to your body ONLY FROM THE NECK DOWN.   Do not use on face/ open                           Wound or open sores. Avoid contact with eyes, ears mouth and genitals (private parts).                       Wash face,  Genitals (private parts) with your normal soap.             6.  Wash thoroughly, paying special attention to the area where your surgery  will be performed.  7.  Thoroughly rinse your body with warm water from the neck down.  8.  DO NOT shower/wash with your normal soap after using and rinsing off  the CHG Soap.                9.  Pat yourself dry with a clean towel.            10.  Wear clean pajamas.            11.  Place clean sheets on your bed the night of your first shower and do not  sleep with pets. Day of Surgery : Do not apply any lotions/deodorants the morning of surgery.  Please wear clean clothes to the hospital/surgery center.  FAILURE TO FOLLOW THESE INSTRUCTIONS MAY RESULT IN THE CANCELLATION OF YOUR SURGERY PATIENT SIGNATURE_________________________________  NURSE SIGNATURE__________________________________  ________________________________________________________________________

## 2021-04-13 ENCOUNTER — Encounter (HOSPITAL_COMMUNITY)
Admission: RE | Admit: 2021-04-13 | Discharge: 2021-04-13 | Disposition: A | Discharge status: Home / Self Care | Payer: Medicare Other | Source: Ambulatory Visit | Attending: Urology | Admitting: Urology

## 2021-04-13 ENCOUNTER — Encounter (HOSPITAL_COMMUNITY): Payer: Self-pay

## 2021-04-13 ENCOUNTER — Other Ambulatory Visit: Payer: Self-pay

## 2021-04-13 VITALS — BP 147/82 | HR 92 | Temp 98.3°F | Resp 20 | Ht 71.0 in | Wt 170.0 lb

## 2021-04-13 DIAGNOSIS — E119 Type 2 diabetes mellitus without complications: Secondary | ICD-10-CM

## 2021-04-13 DIAGNOSIS — Z01812 Encounter for preprocedural laboratory examination: Secondary | ICD-10-CM | POA: Insufficient documentation

## 2021-04-13 HISTORY — DX: Gastro-esophageal reflux disease without esophagitis: K21.9

## 2021-04-13 HISTORY — DX: Myoneural disorder, unspecified: G70.9

## 2021-04-13 LAB — CBC
HCT: 33.8 % — ABNORMAL LOW (ref 39.0–52.0)
Hemoglobin: 11.8 g/dL — ABNORMAL LOW (ref 13.0–17.0)
MCH: 33 pg (ref 26.0–34.0)
MCHC: 34.9 g/dL (ref 30.0–36.0)
MCV: 94.4 fL (ref 80.0–100.0)
Platelets: 176 10*3/uL (ref 150–400)
RBC: 3.58 MIL/uL — ABNORMAL LOW (ref 4.22–5.81)
RDW: 13 % (ref 11.5–15.5)
WBC: 5.8 10*3/uL (ref 4.0–10.5)
nRBC: 0 % (ref 0.0–0.2)

## 2021-04-13 LAB — BASIC METABOLIC PANEL
Anion gap: 8 (ref 5–15)
BUN: 23 mg/dL (ref 8–23)
CO2: 26 mmol/L (ref 22–32)
Calcium: 9.9 mg/dL (ref 8.9–10.3)
Chloride: 91 mmol/L — ABNORMAL LOW (ref 98–111)
Creatinine, Ser: 1.13 mg/dL (ref 0.61–1.24)
GFR, Estimated: >60 mL/min (ref >60)
Glucose, Bld: 180 mg/dL — ABNORMAL HIGH (ref 70–99)
Potassium: 5.1 mmol/L (ref 3.5–5.1)
Sodium: 125 mmol/L — ABNORMAL LOW (ref 135–145)

## 2021-04-13 LAB — HEMOGLOBIN A1C
Hgb A1c MFr Bld: 5.9 % — ABNORMAL HIGH (ref 4.8–5.6)
Mean Plasma Glucose: 122.63 mg/dL

## 2021-04-13 NOTE — Progress Notes (Signed)
COVID test- 04/30/21    PCP - Dr. Johny Sax Cardiologist - Dr. Alma Friendly  Chest x-ray - no EKG - 02/08/21-epic Stress Test - no ECHO - no Cardiac Cath - 2016 Pacemaker/ICD device last checked:NA  Sleep Study -no  CPAP -   Fasting Blood Sugar - Doesn't test Checks Blood Sugar _____ times a day  Blood Thinner Instructions:Eliquis/ Dr. Debara Pickett Aspirin Instructions:Stop 48 hours prior to DOS Last Dose:04/29/21  Anesthesia review: yes  Patient denies shortness of breath, fever, cough and chest pain at PAT appointment Pt reports no SOB with activities.  Patient verbalized understanding of instructions that were given to them at the PAT appointment. Patient was also instructed that they will need to review over the PAT instructions again at home before surgery. yes

## 2021-04-16 LAB — GLUCOSE, CAPILLARY: Glucose-Capillary: 189 mg/dL — ABNORMAL HIGH (ref 70–99)

## 2021-04-18 NOTE — Anesthesia Preprocedure Evaluation (Addendum)
Anesthesia Evaluation  Patient identified by MRN, date of birth, ID band Patient awake    Reviewed: Allergy & Precautions, NPO status , Patient's Chart, lab work & pertinent test results, reviewed documented beta blocker date and time   Airway Mallampati: I  TM Distance: >3 FB Neck ROM: Full    Dental  (+) Teeth Intact, Caps, Dental Advisory Given   Pulmonary former smoker,  Covid 12/5 tested + , asymptomatic   Pulmonary exam normal breath sounds clear to auscultation       Cardiovascular hypertension, Pt. on medications and Pt. on home beta blockers Normal cardiovascular exam Rhythm:Regular Rate:Normal     Neuro/Psych Peripheral neuropathy  Neuromuscular disease negative psych ROS   GI/Hepatic GERD  Medicated and Controlled,(+)     substance abuse  alcohol use,   Endo/Other  diabetes, Poorly Controlled, Type 2, Oral Hypoglycemic AgentsHyperlipidemia  Renal/GU Right renal mass Hyponatremia  negative genitourinary   Musculoskeletal negative musculoskeletal ROS (+)   Abdominal   Peds  Hematology  (+) anemia , Eliquis therapy- last dose 12/25   Anesthesia Other Findings   Reproductive/Obstetrics                         Anesthesia Physical Anesthesia Plan  ASA: 3  Anesthesia Plan: General   Post-op Pain Management:    Induction: Intravenous  PONV Risk Score and Plan: 4 or greater and Treatment may vary due to age or medical condition, Ondansetron and Dexamethasone  Airway Management Planned: Oral ETT  Additional Equipment: None  Intra-op Plan:   Post-operative Plan: Extubation in OR  Informed Consent: I have reviewed the patients History and Physical, chart, labs and discussed the procedure including the risks, benefits and alternatives for the proposed anesthesia with the patient or authorized representative who has indicated his/her understanding and acceptance.     Dental  advisory given  Plan Discussed with: CRNA and Anesthesiologist  Anesthesia Plan Comments: (See PAT note 04/13/2021, Konrad Felix Ward, PA-C)      Anesthesia Quick Evaluation

## 2021-04-18 NOTE — Progress Notes (Signed)
Anesthesia Chart Review   Case: [id number] Date/Time: 05/02/21 0815   Procedure: XI RETROPERITONEAL ROBOTIC ASSITED PARTIAL NEPHRECTOMY (Right)   Anesthesia type: General   Pre-op diagnosis: RIGHT RENAL MASS   Location: [facility] ROOM 03 / [facility]   Surgeons: Alexis Frock, MD       DISCUSSION:65 y.o. former smoker with h/o GERD, HTN, DM II, a-fib, right renal mass scheduled for above procedure 05/02/2021 with Dr. Alexis Frock.   Per cardiology preoperative evaluation 04/10/2021, ""Based on intermediate risk surgery and ability to tolerate> 4 METS exercise, further testing not warranted.  Would not change management.  We are checking 2D echocardiogram simply to reassess EF for perioperative management." Based on ACC/AHA guidelines, the patient is at acceptable risk for the planned procedure and may proceed without further cardiovascular testing.  Apixaban (Eliquis) can be held for 2-3 days prior to the procedure.  It should be resumed postoperatively when it is felt to be safe."  Anticipate pt can proceed with planned procedure barring acute status change.   VS: BP (!) 147/82   Pulse 92   Temp 36.8 C (Oral)   Resp 20   Ht 5\' 11"  (1.803 m)   Wt 77.1 kg   SpO2 99%   BMI 23.71 kg/m   PROVIDERS: Ginger Organ., MD is PCP   Lyman Bishop, MD is Cardiologist  LABS:  labs forwarded to PCP and surgeon, recheck DOS (all labs ordered are listed, but only abnormal results are displayed)  Labs Reviewed  HEMOGLOBIN A1C - Abnormal; Notable for the following components:      Result Value   Hgb A1c MFr Bld 5.9 (*)    All other components within normal limits  BASIC METABOLIC PANEL - Abnormal; Notable for the following components:   Sodium 125 (*)    Chloride 91 (*)    Glucose, Bld 180 (*)    All other components within normal limits  CBC - Abnormal; Notable for the following components:   RBC 3.58 (*)    Hemoglobin 11.8 (*)    HCT 33.8 (*)    All other components within normal  limits  GLUCOSE, CAPILLARY - Abnormal; Notable for the following components:   Glucose-Capillary 189 (*)    All other components within normal limits     IMAGES:   EKG: 02/08/2021 Rate 73 bpm  Atrial fibrillation with premature ventricular or aberrantly conducted complexes   CV: Cardiac Cath 01/05/2015 The left ventricular systolic function is normal. LVEF 55%, normal wall motion Mild luminal irregularities of the LCX, with mild areas of ectasia. Calcified ostial D1 with mild stenosis.   Mild non-obstructive CAD. False positive nuclear stress test. Will start flecainide 50 mg BID tomorrow. Restart Eliquis on 01/07/15. Follow-up in the office next week for an EKG on flecainide. Will need follow-up with me in 3-4 weeks to assess if he is still in a-fib, at which time if he persists we could schedule cardioversion.  Echo 03/29/2021  1. Left ventricular ejection fraction, by estimation, is 55 to 60%. The  left ventricle has normal function. The left ventricle has no regional  wall motion abnormalities. There is moderate left ventricular hypertrophy  of the basal-septal segment. Left  ventricular diastolic parameters are indeterminate.   2. Right ventricular systolic function is normal. The right ventricular  size is normal. There is normal pulmonary artery systolic pressure.   3. Left atrial size was severely dilated.   4. The mitral valve is normal in structure. Mild mitral  valve  regurgitation. No evidence of mitral stenosis.   5. The aortic valve is tricuspid. There is mild calcification of the  aortic valve. There is mild thickening of the aortic valve. Aortic valve  regurgitation is not visualized. Mild aortic valve sclerosis is present,  with no evidence of aortic valve  stenosis.   6. The inferior vena cava is normal in size with greater than 50%  respiratory variability, suggesting right atrial pressure of 3 mmHg. Past Medical History:  Diagnosis Date   Alcohol abuse     Diabetes mellitus without complication (HCC)    Diverticulosis    GERD (gastroesophageal reflux disease)    Hyperlipidemia    Hypertension    Left atrial enlargement    Neuromuscular disorder (HCC)    neuropathy   Persistent atrial fibrillation (HCC)    CHA2DS2-VASc score 4 (age-68, HTN-1, DM 2-1, aortic plaque)    Past Surgical History:  Procedure Laterality Date   APPENDECTOMY     CARDIAC CATHETERIZATION N/A 01/05/2015   Procedure: Left Heart Cath and Coronary Angiography;  Surgeon: Pixie Casino, MD;  Location: [facility];  Service: Cardiovascular;; (For Abnormal Nuclear Stress Test) mild luminal irregularities of the LCx with mild areas of ectasia.  Calcified ostial D1 mild stenosis. ->  Mild/nonobstructive CAD.   CARDIOVERSION N/A 11/01/2014   Procedure: CARDIOVERSION;  Surgeon: Pixie Casino, MD;  Location: [facility] ENDOSCOPY;  Service: Cardiovascular;  Laterality: N/A;   CARDIOVERSION N/A 02/24/2015   Procedure: CARDIOVERSION;  Surgeon: Pixie Casino, MD;  Location: [facility] ENDOSCOPY;  Service: Cardiovascular;  Laterality: N/A;   CATARACT EXTRACTION Bilateral    COLON SURGERY N/A 2006   14 inches removed from diverticitis perforation   COLONOSCOPY     Per stoma   EYE SURGERY Bilateral    Cat Sx   INNER EAR SURGERY     as a child   NM [facility]TD  12/08/2014   FALSE POSITIVE: Read asINTERMEDIATE RISK.  Partially reversible medium size moderate severity defect in the basal--mid-apical inferoseptal-inferior and apical walls.  Not gated due to A. fib. ->  Referred for cath- NON-OBSTRUCTIVE CAD   TEE WITHOUT CARDIOVERSION N/A 11/01/2014   Procedure: TRANSESOPHAGEAL ECHOCARDIOGRAM (TEE) -unsuccesful DCCV;  Surgeon: Pixie Casino, MD;  Location: [facility];  Service: Cardiovascular;; Mild concentric LVH.  EF 55 to 60%.  Normal wall motion.  Grade 3 aortic atheroma.  No source of cardiac emboli.  -->  Followed by unsuccessful DCCV.    MEDICATIONS:  atorvastatin (LIPITOR) 20  MG tablet   Cyanocobalamin (VITAMIN B-12 PO)   DILT-XR 240 MG 24 hr capsule   diltiazem (CARDIZEM CD) 240 MG 24 hr capsule   ELIQUIS 5 MG TABS tablet   fenofibrate 160 MG tablet   gabapentin (NEURONTIN) 300 MG capsule   hydrocortisone (ANUSOL-HC) 25 MG suppository   Lactobacillus-Inulin (PROBIOTIC DIGESTIVE SUPPORT PO)   lisinopril (PRINIVIL,ZESTRIL) 20 MG tablet   metFORMIN (GLUCOPHAGE) 1000 MG tablet   metoprolol succinate (TOPROL-XL) 100 MG 24 hr tablet   Omega-3 Fatty Acids (FISH OIL) 1000 MG CAPS   Wheat Dextrin (BENEFIBER DRINK MIX) PACK   No current facility-administered medications for this encounter.    Konrad Felix Ward, PA-C [facility] Pre-Surgical Testing [phone]

## 2021-04-25 DIAGNOSIS — N1831 Chronic kidney disease, stage 3a: Secondary | ICD-10-CM | POA: Diagnosis not present

## 2021-04-25 DIAGNOSIS — E785 Hyperlipidemia, unspecified: Secondary | ICD-10-CM | POA: Diagnosis not present

## 2021-04-25 DIAGNOSIS — E1129 Type 2 diabetes mellitus with other diabetic kidney complication: Secondary | ICD-10-CM | POA: Diagnosis not present

## 2021-04-25 DIAGNOSIS — I10 Essential (primary) hypertension: Secondary | ICD-10-CM | POA: Diagnosis not present

## 2021-04-26 DIAGNOSIS — I639 Cerebral infarction, unspecified: Secondary | ICD-10-CM

## 2021-04-26 HISTORY — DX: Cerebral infarction, unspecified: I63.9

## 2021-04-30 ENCOUNTER — Other Ambulatory Visit: Payer: Self-pay | Admitting: Urology

## 2021-05-01 ENCOUNTER — Other Ambulatory Visit: Payer: Self-pay | Admitting: Urology

## 2021-05-01 LAB — SARS CORONAVIRUS 2 (TAT 6-24 HRS): SARS Coronavirus 2: POSITIVE — AB

## 2021-05-01 NOTE — Progress Notes (Signed)
Margarita Mail notified of patient's covid test results.

## 2021-05-02 DIAGNOSIS — E871 Hypo-osmolality and hyponatremia: Secondary | ICD-10-CM

## 2021-05-10 ENCOUNTER — Encounter (HOSPITAL_COMMUNITY): Payer: Medicare Other

## 2021-05-14 NOTE — Progress Notes (Addendum)
Angel Shelton was updated on surgery date 12/28and arrival time of 6:15 AM, reviewed pre op instructions verbalized understanding. No changes to allergies, medical history or medications. Denied any symptoms of COVID after positive test on 04/30/2021. Last dose of Eliquis will be 05/20/21.

## 2021-05-18 DIAGNOSIS — H40013 Open angle with borderline findings, low risk, bilateral: Secondary | ICD-10-CM | POA: Diagnosis not present

## 2021-05-18 DIAGNOSIS — Z961 Presence of intraocular lens: Secondary | ICD-10-CM | POA: Diagnosis not present

## 2021-05-18 DIAGNOSIS — H26493 Other secondary cataract, bilateral: Secondary | ICD-10-CM | POA: Diagnosis not present

## 2021-05-18 DIAGNOSIS — E119 Type 2 diabetes mellitus without complications: Secondary | ICD-10-CM | POA: Diagnosis not present

## 2021-05-22 ENCOUNTER — Encounter (HOSPITAL_COMMUNITY): Payer: Self-pay | Admitting: Urology

## 2021-05-23 ENCOUNTER — Encounter (HOSPITAL_COMMUNITY): Payer: Self-pay | Admitting: Urology

## 2021-05-23 ENCOUNTER — Other Ambulatory Visit: Payer: Self-pay

## 2021-05-23 ENCOUNTER — Encounter (HOSPITAL_COMMUNITY)
Admission: RE | Disposition: A | Discharge status: Home / Self Care | Payer: Self-pay | Source: Home / Self Care | Attending: Urology

## 2021-05-23 ENCOUNTER — Observation Stay (HOSPITAL_COMMUNITY)
Admission: RE | Admit: 2021-05-23 | Discharge: 2021-05-25 | Disposition: A | Discharge status: Home / Self Care | Payer: Medicare Other | Attending: Urology | Admitting: Urology

## 2021-05-23 ENCOUNTER — Ambulatory Visit (HOSPITAL_COMMUNITY): Payer: Medicare Other | Admitting: Physician Assistant

## 2021-05-23 DIAGNOSIS — Z7984 Long term (current) use of oral hypoglycemic drugs: Secondary | ICD-10-CM | POA: Insufficient documentation

## 2021-05-23 DIAGNOSIS — E119 Type 2 diabetes mellitus without complications: Secondary | ICD-10-CM | POA: Diagnosis not present

## 2021-05-23 DIAGNOSIS — E871 Hypo-osmolality and hyponatremia: Secondary | ICD-10-CM

## 2021-05-23 DIAGNOSIS — N2889 Other specified disorders of kidney and ureter: Secondary | ICD-10-CM | POA: Diagnosis present

## 2021-05-23 DIAGNOSIS — I4819 Other persistent atrial fibrillation: Secondary | ICD-10-CM | POA: Diagnosis not present

## 2021-05-23 DIAGNOSIS — Z7901 Long term (current) use of anticoagulants: Secondary | ICD-10-CM | POA: Diagnosis not present

## 2021-05-23 DIAGNOSIS — Z79899 Other long term (current) drug therapy: Secondary | ICD-10-CM | POA: Insufficient documentation

## 2021-05-23 DIAGNOSIS — C641 Malignant neoplasm of right kidney, except renal pelvis: Principal | ICD-10-CM | POA: Insufficient documentation

## 2021-05-23 DIAGNOSIS — I700 Atherosclerosis of aorta: Secondary | ICD-10-CM | POA: Diagnosis not present

## 2021-05-23 DIAGNOSIS — Z87891 Personal history of nicotine dependence: Secondary | ICD-10-CM | POA: Insufficient documentation

## 2021-05-23 DIAGNOSIS — I10 Essential (primary) hypertension: Secondary | ICD-10-CM | POA: Diagnosis not present

## 2021-05-23 HISTORY — PX: ROBOTIC ASSITED PARTIAL NEPHRECTOMY: SHX6087

## 2021-05-23 LAB — BASIC METABOLIC PANEL
Anion gap: 12 (ref 5–15)
Anion gap: 10 (ref 5–15)
BUN: 14 mg/dL (ref 8–23)
BUN: 14 mg/dL (ref 8–23)
CO2: 21 mmol/L — ABNORMAL LOW (ref 22–32)
CO2: 21 mmol/L — ABNORMAL LOW (ref 22–32)
Calcium: 9.4 mg/dL (ref 8.9–10.3)
Calcium: 8.7 mg/dL — ABNORMAL LOW (ref 8.9–10.3)
Chloride: 101 mmol/L (ref 98–111)
Chloride: 103 mmol/L (ref 98–111)
Creatinine, Ser: 1.01 mg/dL (ref 0.61–1.24)
Creatinine, Ser: 1.01 mg/dL (ref 0.61–1.24)
GFR, Estimated: >60 mL/min (ref >60)
GFR, Estimated: >60 mL/min (ref >60)
Glucose, Bld: 90 mg/dL (ref 70–99)
Glucose, Bld: 137 mg/dL — ABNORMAL HIGH (ref 70–99)
Potassium: 3.9 mmol/L (ref 3.5–5.1)
Potassium: 4.4 mmol/L (ref 3.5–5.1)
Sodium: 134 mmol/L — ABNORMAL LOW (ref 135–145)
Sodium: 134 mmol/L — ABNORMAL LOW (ref 135–145)

## 2021-05-23 LAB — GLUCOSE, CAPILLARY
Glucose-Capillary: 83 mg/dL (ref 70–99)
Glucose-Capillary: 110 mg/dL — ABNORMAL HIGH (ref 70–99)
Glucose-Capillary: 249 mg/dL — ABNORMAL HIGH (ref 70–99)
Glucose-Capillary: 365 mg/dL — ABNORMAL HIGH (ref 70–99)

## 2021-05-23 LAB — ABO/RH: ABO/RH(D): A POS

## 2021-05-23 LAB — HEMOGLOBIN AND HEMATOCRIT, BLOOD
HCT: 36.4 % — ABNORMAL LOW (ref 39.0–52.0)
Hemoglobin: 12.4 g/dL — ABNORMAL LOW (ref 13.0–17.0)

## 2021-05-23 SURGERY — NEPHRECTOMY, PARTIAL, ROBOT-ASSISTED
Anesthesia: General | Laterality: Right

## 2021-05-23 MED ORDER — DEXMEDETOMIDINE (PRECEDEX) IN NS 20 MCG/5ML (4 MCG/ML) IV SYRINGE
PREFILLED_SYRINGE | INTRAVENOUS | Status: AC
  Filled 2021-05-23: qty 5

## 2021-05-23 MED ORDER — PHENYLEPHRINE HCL-NACL 20-0.9 MG/250ML-% IV SOLN
INTRAVENOUS | Status: AC
  Filled 2021-05-23: qty 250

## 2021-05-23 MED ORDER — BUPIVACAINE LIPOSOME 1.3 % IJ SUSP
INTRAMUSCULAR | Status: DC | PRN
  Administered 2021-05-23: 20 mL

## 2021-05-23 MED ORDER — PHENYLEPHRINE 40 MCG/ML (10ML) SYRINGE FOR IV PUSH (FOR BLOOD PRESSURE SUPPORT)
PREFILLED_SYRINGE | INTRAVENOUS | Status: AC
  Filled 2021-05-23: qty 10

## 2021-05-23 MED ORDER — METOPROLOL SUCCINATE ER 100 MG PO TB24
100.0000 mg | ORAL_TABLET | Freq: Every day | ORAL | Status: DC
  Administered 2021-05-24 – 2021-05-25 (×2): 100 mg via ORAL
  Filled 2021-05-23 (×2): qty 1

## 2021-05-23 MED ORDER — CHLORHEXIDINE GLUCONATE CLOTH 2 % EX PADS: 6.0000 | MEDICATED_PAD | Freq: Every day | CUTANEOUS | Status: DC

## 2021-05-23 MED ORDER — FENTANYL CITRATE (PF) 250 MCG/5ML IJ SOLN
INTRAMUSCULAR | Status: AC
  Filled 2021-05-23: qty 5

## 2021-05-23 MED ORDER — SODIUM CHLORIDE 0.45 % IV SOLN: INTRAVENOUS | Status: DC

## 2021-05-23 MED ORDER — ONDANSETRON HCL 4 MG/2ML IJ SOLN: 4.0000 mg | Freq: Once | INTRAMUSCULAR | Status: DC | PRN

## 2021-05-23 MED ORDER — MORPHINE SULFATE (PF) 2 MG/ML IV SOLN
INTRAVENOUS | Status: AC
  Filled 2021-05-23: qty 1

## 2021-05-23 MED ORDER — DEXAMETHASONE SODIUM PHOSPHATE 10 MG/ML IJ SOLN
INTRAMUSCULAR | Status: DC | PRN
  Administered 2021-05-23: 4 mg via INTRAVENOUS

## 2021-05-23 MED ORDER — OXYCODONE HCL 5 MG PO TABS
ORAL_TABLET | ORAL | Status: AC
  Filled 2021-05-23: qty 1

## 2021-05-23 MED ORDER — PHENYLEPHRINE HCL-NACL 20-0.9 MG/250ML-% IV SOLN
INTRAVENOUS | Status: DC | PRN
  Administered 2021-05-23: 30 ug/min via INTRAVENOUS

## 2021-05-23 MED ORDER — SUGAMMADEX SODIUM 200 MG/2ML IV SOLN
INTRAVENOUS | Status: DC | PRN
  Administered 2021-05-23: 200 mg via INTRAVENOUS

## 2021-05-23 MED ORDER — ONDANSETRON HCL 4 MG/2ML IJ SOLN
INTRAMUSCULAR | Status: AC
  Filled 2021-05-23: qty 2

## 2021-05-23 MED ORDER — DILTIAZEM HCL ER COATED BEADS 240 MG PO CP24
240.0000 mg | ORAL_CAPSULE | Freq: Every day | ORAL | Status: DC
  Administered 2021-05-24 – 2021-05-25 (×2): 240 mg via ORAL
  Filled 2021-05-23 (×2): qty 1

## 2021-05-23 MED ORDER — BUPIVACAINE LIPOSOME 1.3 % IJ SUSP
INTRAMUSCULAR | Status: AC
  Filled 2021-05-23: qty 20

## 2021-05-23 MED ORDER — SODIUM CHLORIDE 0.9% FLUSH
INTRAVENOUS | Status: DC | PRN
  Administered 2021-05-23: 20 mL

## 2021-05-23 MED ORDER — INSULIN ASPART 100 UNIT/ML IJ SOLN
0.0000 [IU] | Freq: Every day | INTRAMUSCULAR | Status: DC
  Administered 2021-05-23: 22:00:00 5 [IU] via SUBCUTANEOUS
  Administered 2021-05-24: 2 [IU] via SUBCUTANEOUS

## 2021-05-23 MED ORDER — SODIUM CHLORIDE 0.9 % IV SOLN: INTRAVENOUS | Status: DC

## 2021-05-23 MED ORDER — LACTATED RINGERS IV SOLN: INTRAVENOUS | Status: DC

## 2021-05-23 MED ORDER — HYDROMORPHONE HCL 1 MG/ML IJ SOLN
INTRAMUSCULAR | Status: AC
  Administered 2021-05-23: 12:00:00 0.5 mg via INTRAVENOUS
  Filled 2021-05-23: qty 1

## 2021-05-23 MED ORDER — LIDOCAINE HCL (PF) 2 % IJ SOLN
INTRAMUSCULAR | Status: AC
  Filled 2021-05-23: qty 5

## 2021-05-23 MED ORDER — DEXAMETHASONE SODIUM PHOSPHATE 10 MG/ML IJ SOLN
INTRAMUSCULAR | Status: AC
  Filled 2021-05-23: qty 1

## 2021-05-23 MED ORDER — GABAPENTIN 300 MG PO CAPS
300.0000 mg | ORAL_CAPSULE | Freq: Three times a day (TID) | ORAL | Status: DC
  Administered 2021-05-23 – 2021-05-25 (×6): 300 mg via ORAL
  Filled 2021-05-23 (×6): qty 1

## 2021-05-23 MED ORDER — CHLORHEXIDINE GLUCONATE 0.12 % MT SOLN: 15.0000 mL | Freq: Once | OROMUCOSAL | Status: AC

## 2021-05-23 MED ORDER — INSULIN ASPART 100 UNIT/ML IJ SOLN
0.0000 [IU] | Freq: Three times a day (TID) | INTRAMUSCULAR | Status: DC
  Administered 2021-05-24: 18:00:00 5 [IU] via SUBCUTANEOUS
  Administered 2021-05-24: 09:00:00 8 [IU] via SUBCUTANEOUS
  Administered 2021-05-24: 13:00:00 5 [IU] via SUBCUTANEOUS

## 2021-05-23 MED ORDER — LISINOPRIL 20 MG PO TABS
20.0000 mg | ORAL_TABLET | Freq: Every day | ORAL | Status: DC
  Administered 2021-05-24: 09:00:00 20 mg via ORAL
  Filled 2021-05-23: qty 1

## 2021-05-23 MED ORDER — MIDAZOLAM HCL 2 MG/2ML IJ SOLN
INTRAMUSCULAR | Status: AC
  Filled 2021-05-23: qty 2

## 2021-05-23 MED ORDER — STERILE WATER FOR IRRIGATION IR SOLN
Status: DC | PRN
  Administered 2021-05-23: 1000 mL

## 2021-05-23 MED ORDER — PROPOFOL 10 MG/ML IV BOLUS
INTRAVENOUS | Status: AC
  Filled 2021-05-23: qty 20

## 2021-05-23 MED ORDER — LACTATED RINGERS IR SOLN
Status: DC | PRN
  Administered 2021-05-23: 1

## 2021-05-23 MED ORDER — CEFAZOLIN SODIUM-DEXTROSE 1-4 GM/50ML-% IV SOLN
1.0000 g | Freq: Three times a day (TID) | INTRAVENOUS | Status: AC
  Administered 2021-05-23 (×2): 1 g via INTRAVENOUS
  Filled 2021-05-23 (×2): qty 50

## 2021-05-23 MED ORDER — ROCURONIUM BROMIDE 10 MG/ML (PF) SYRINGE
PREFILLED_SYRINGE | INTRAVENOUS | Status: AC
  Filled 2021-05-23: qty 10

## 2021-05-23 MED ORDER — FENTANYL CITRATE (PF) 100 MCG/2ML IJ SOLN
INTRAMUSCULAR | Status: DC | PRN
  Administered 2021-05-23: 25 ug via INTRAVENOUS
  Administered 2021-05-23 (×2): 50 ug via INTRAVENOUS

## 2021-05-23 MED ORDER — DEXTROSE-NACL 5-0.45 % IV SOLN: INTRAVENOUS | Status: DC

## 2021-05-23 MED ORDER — OXYCODONE HCL 5 MG/5ML PO SOLN: 5.0000 mg | Freq: Once | ORAL | Status: DC | PRN

## 2021-05-23 MED ORDER — OXYCODONE HCL 5 MG PO TABS
5.0000 mg | ORAL_TABLET | ORAL | Status: DC | PRN
  Administered 2021-05-23 (×2): 5 mg via ORAL
  Filled 2021-05-23: qty 1

## 2021-05-23 MED ORDER — EPHEDRINE 5 MG/ML INJ
INTRAVENOUS | Status: AC
  Filled 2021-05-23: qty 5

## 2021-05-23 MED ORDER — CEFAZOLIN SODIUM-DEXTROSE 2-4 GM/100ML-% IV SOLN
INTRAVENOUS | Status: AC
  Filled 2021-05-23: qty 100

## 2021-05-23 MED ORDER — PROPOFOL 10 MG/ML IV BOLUS
INTRAVENOUS | Status: DC | PRN
  Administered 2021-05-23: 150 mg via INTRAVENOUS

## 2021-05-23 MED ORDER — ONDANSETRON HCL 4 MG/2ML IJ SOLN: 4.0000 mg | INTRAMUSCULAR | Status: DC | PRN

## 2021-05-23 MED ORDER — ROCURONIUM BROMIDE 100 MG/10ML IV SOLN
INTRAVENOUS | Status: DC | PRN
  Administered 2021-05-23: 60 mg via INTRAVENOUS
  Administered 2021-05-23: 20 mg via INTRAVENOUS
  Administered 2021-05-23: 30 mg via INTRAVENOUS

## 2021-05-23 MED ORDER — MORPHINE SULFATE (PF) 2 MG/ML IV SOLN
2.0000 mg | INTRAVENOUS | Status: DC | PRN
  Administered 2021-05-23: 14:00:00 2 mg via INTRAVENOUS

## 2021-05-23 MED ORDER — POLYETHYLENE GLYCOL 3350 17 GM/SCOOP PO POWD: 1.0000 | Freq: Once | ORAL | Status: DC

## 2021-05-23 MED ORDER — HEMOSTATIC AGENTS (NO CHARGE) OPTIME
TOPICAL | Status: DC | PRN
  Administered 2021-05-23: 1 via TOPICAL

## 2021-05-23 MED ORDER — HYDROMORPHONE HCL 1 MG/ML IJ SOLN
0.2500 mg | INTRAMUSCULAR | Status: DC | PRN
  Administered 2021-05-23: 12:00:00 0.5 mg via INTRAVENOUS

## 2021-05-23 MED ORDER — EPHEDRINE SULFATE 50 MG/ML IJ SOLN
INTRAMUSCULAR | Status: DC | PRN
  Administered 2021-05-23: 5 mg via INTRAVENOUS

## 2021-05-23 MED ORDER — DEXMEDETOMIDINE (PRECEDEX) IN NS 20 MCG/5ML (4 MCG/ML) IV SYRINGE
PREFILLED_SYRINGE | INTRAVENOUS | Status: DC | PRN
  Administered 2021-05-23: 2 ug via INTRAVENOUS
  Administered 2021-05-23: 4 ug via INTRAVENOUS
  Administered 2021-05-23: 8 ug via INTRAVENOUS

## 2021-05-23 MED ORDER — OXYCODONE HCL 5 MG PO TABS: 5.0000 mg | ORAL_TABLET | Freq: Once | ORAL | Status: DC | PRN

## 2021-05-23 MED ORDER — ACETAMINOPHEN 500 MG PO TABS
1000.0000 mg | ORAL_TABLET | Freq: Four times a day (QID) | ORAL | Status: AC
  Administered 2021-05-23 – 2021-05-24 (×4): 1000 mg via ORAL
  Filled 2021-05-23 (×4): qty 2

## 2021-05-23 MED ORDER — ONDANSETRON HCL 4 MG/2ML IJ SOLN
INTRAMUSCULAR | Status: DC | PRN
  Administered 2021-05-23: 4 mg via INTRAVENOUS

## 2021-05-23 MED ORDER — SODIUM CHLORIDE (PF) 0.9 % IJ SOLN
INTRAMUSCULAR | Status: AC
  Filled 2021-05-23: qty 20

## 2021-05-23 MED ORDER — PHENYLEPHRINE HCL (PRESSORS) 10 MG/ML IV SOLN
INTRAVENOUS | Status: DC | PRN
  Administered 2021-05-23: 80 ug via INTRAVENOUS
  Administered 2021-05-23: 120 ug via INTRAVENOUS

## 2021-05-23 MED ORDER — MIDAZOLAM HCL 5 MG/5ML IJ SOLN
INTRAMUSCULAR | Status: DC | PRN
  Administered 2021-05-23: 2 mg via INTRAVENOUS

## 2021-05-23 MED ORDER — ORAL CARE MOUTH RINSE
15.0000 mL | Freq: Once | OROMUCOSAL | Status: AC
  Administered 2021-05-23: 07:00:00 15 mL via OROMUCOSAL

## 2021-05-23 MED ORDER — LIDOCAINE HCL (CARDIAC) PF 100 MG/5ML IV SOSY
PREFILLED_SYRINGE | INTRAVENOUS | Status: DC | PRN
Start: 2021-05-23 — End: 2021-05-23
  Administered 2021-05-23: 50 mg via INTRAVENOUS

## 2021-05-23 MED ORDER — CEFAZOLIN SODIUM-DEXTROSE 2-4 GM/100ML-% IV SOLN
2.0000 g | INTRAVENOUS | Status: AC
  Administered 2021-05-23: 08:00:00 2 g via INTRAVENOUS

## 2021-05-23 SURGICAL SUPPLY — 77 items
APPLICATOR SURGIFLO ENDO (HEMOSTASIS) ×3 IMPLANT
BAG COUNTER SPONGE SURGICOUNT (BAG) IMPLANT
BAG SURGICOUNT SPONGE COUNTING (BAG)
CHLORAPREP W/TINT 26 (MISCELLANEOUS) ×3 IMPLANT
CLIP LIGATING HEM O LOK PURPLE (MISCELLANEOUS) ×6 IMPLANT
CLIP LIGATING HEMO LOK XL GOLD (MISCELLANEOUS) IMPLANT
CLIP LIGATING HEMO O LOK GREEN (MISCELLANEOUS) ×3 IMPLANT
CLIP SUT LAPRA TY ABSORB (SUTURE) ×5 IMPLANT
COVER SURGICAL LIGHT HANDLE (MISCELLANEOUS) ×3 IMPLANT
COVER TIP SHEARS 8 DVNC (MISCELLANEOUS) ×1 IMPLANT
COVER TIP SHEARS 8MM DA VINCI (MISCELLANEOUS) ×3
CUTTER ECHEON FLEX ENDO 45 340 (ENDOMECHANICALS) IMPLANT
DECANTER SPIKE VIAL GLASS SM (MISCELLANEOUS) ×3 IMPLANT
DERMABOND ADVANCED (GAUZE/BANDAGES/DRESSINGS) ×2
DERMABOND ADVANCED .7 DNX12 (GAUZE/BANDAGES/DRESSINGS) ×1 IMPLANT
DISSECT BALLN SPACEMKR + OVL (BALLOONS) ×3
DISSECTOR BALLN SPACEMKR + OVL (BALLOONS) IMPLANT
DRAIN CHANNEL 15F RND FF 3/16 (WOUND CARE) ×3 IMPLANT
DRAPE ARM DVNC X/XI (DISPOSABLE) ×4 IMPLANT
DRAPE COLUMN DVNC XI (DISPOSABLE) ×1 IMPLANT
DRAPE DA VINCI XI ARM (DISPOSABLE) ×12
DRAPE DA VINCI XI COLUMN (DISPOSABLE) ×3
DRAPE INCISE IOBAN 66X45 STRL (DRAPES) ×3 IMPLANT
DRAPE SHEET LG 3/4 BI-LAMINATE (DRAPES) ×3 IMPLANT
DRSG TEGADERM 4X4.75 (GAUZE/BANDAGES/DRESSINGS) ×3 IMPLANT
ELECT PENCIL ROCKER SW 15FT (MISCELLANEOUS) ×3 IMPLANT
ELECT REM PT RETURN 15FT ADLT (MISCELLANEOUS) ×3 IMPLANT
EVACUATOR SILICONE 100CC (DRAIN) ×3 IMPLANT
GAUZE 4X4 16PLY ~~LOC~~+RFID DBL (SPONGE) ×3 IMPLANT
GAUZE SPONGE 2X2 8PLY STRL LF (GAUZE/BANDAGES/DRESSINGS) IMPLANT
GLOVE SURG ENC MOIS LTX SZ6.5 (GLOVE) ×3 IMPLANT
GLOVE SURG ENC TEXT LTX SZ7.5 (GLOVE) ×6 IMPLANT
GOWN STRL REUS W/TWL LRG LVL3 (GOWN DISPOSABLE) ×9 IMPLANT
HEMOSTAT SURGICEL 4X8 (HEMOSTASIS) ×3 IMPLANT
HOLDER FOLEY CATH W/STRAP (MISCELLANEOUS) ×3 IMPLANT
IRRIG SUCT STRYKERFLOW 2 WTIP (MISCELLANEOUS) ×3
IRRIGATION SUCT STRKRFLW 2 WTP (MISCELLANEOUS) ×1 IMPLANT
KIT BASIN OR (CUSTOM PROCEDURE TRAY) ×3 IMPLANT
KIT TURNOVER KIT A (KITS) IMPLANT
LOOP VESSEL MAXI BLUE (MISCELLANEOUS) ×3 IMPLANT
MARKER SKIN DUAL TIP RULER LAB (MISCELLANEOUS) ×3 IMPLANT
NDL INSUFFLATION 14GA 120MM (NEEDLE) ×1 IMPLANT
NEEDLE INSUFFLATION 14GA 120MM (NEEDLE) ×3 IMPLANT
PORT ACCESS TROCAR AIRSEAL 12 (TROCAR) ×1 IMPLANT
PORT ACCESS TROCAR AIRSEAL 5M (TROCAR) ×2
POUCH SPECIMEN RETRIEVAL 10MM (ENDOMECHANICALS) ×3 IMPLANT
PROTECTOR NERVE ULNAR (MISCELLANEOUS) ×6 IMPLANT
RELOAD STAPLE 45 2.6 WHT THIN (STAPLE) IMPLANT
SEAL CANN UNIV 5-8 DVNC XI (MISCELLANEOUS) ×4 IMPLANT
SEAL XI 5MM-8MM UNIVERSAL (MISCELLANEOUS) ×12
SET TRI-LUMEN FLTR TB AIRSEAL (TUBING) ×3 IMPLANT
SOLUTION ELECTROLUBE (MISCELLANEOUS) ×3 IMPLANT
SPOGE SURGIFLO 8M (HEMOSTASIS) ×3
SPONGE GAUZE 2X2 STER 10/PKG (GAUZE/BANDAGES/DRESSINGS) ×2
SPONGE SURGIFLO 8M (HEMOSTASIS) IMPLANT
SPONGE T-LAP 4X18 ~~LOC~~+RFID (SPONGE) ×3 IMPLANT
STAPLE RELOAD 45 WHT (STAPLE) IMPLANT
STAPLE RELOAD 45MM WHITE (STAPLE)
SURGIFLO W/THROMBIN 8M KIT (HEMOSTASIS) ×3 IMPLANT
SUT ETHILON 3 0 PS 1 (SUTURE) ×5 IMPLANT
SUT MNCRL AB 4-0 PS2 18 (SUTURE) ×6 IMPLANT
SUT PDS AB 1 CT1 27 (SUTURE) ×6 IMPLANT
SUT V-LOC BARB 180 2/0GR6 GS22 (SUTURE)
SUT VIC AB 0 CT1 27 (SUTURE) ×12
SUT VIC AB 0 CT1 27XBRD ANTBC (SUTURE) ×4 IMPLANT
SUT VIC AB 2-0 SH 27 (SUTURE) ×6
SUT VIC AB 2-0 SH 27X BRD (SUTURE) ×2 IMPLANT
SUT VLOC BARB 180 ABS3/0GR12 (SUTURE) ×3
SUTURE V-LC BRB 180 2/0GR6GS22 (SUTURE) IMPLANT
SUTURE VLOC BRB 180 ABS3/0GR12 (SUTURE) ×1 IMPLANT
TOWEL OR 17X26 10 PK STRL BLUE (TOWEL DISPOSABLE) ×3 IMPLANT
TOWEL OR NON WOVEN STRL DISP B (DISPOSABLE) ×3 IMPLANT
TRAY FOLEY MTR SLVR 16FR STAT (SET/KITS/TRAYS/PACK) ×3 IMPLANT
TRAY LAPAROSCOPIC (CUSTOM PROCEDURE TRAY) ×3 IMPLANT
TROCAR BLADELESS OPT 5 100 (ENDOMECHANICALS) IMPLANT
TROCAR XCEL 12X100 BLDLESS (ENDOMECHANICALS) ×3 IMPLANT
WATER STERILE IRR 1000ML POUR (IV SOLUTION) ×6 IMPLANT

## 2021-05-23 NOTE — Anesthesia Postprocedure Evaluation (Signed)
Anesthesia Post Note  Patient: Angel Shelton  Procedure(s) Performed: XI RETROPERITONEAL ROBOTIC ASSITED PARTIAL NEPHRECTOMY (Right)     Patient location during evaluation: PACU Anesthesia Type: General Level of consciousness: awake and alert and oriented Pain management: pain level controlled Vital Signs Assessment: post-procedure vital signs reviewed and stable Respiratory status: spontaneous breathing, nonlabored ventilation and respiratory function stable Cardiovascular status: blood pressure returned to baseline and stable Postop Assessment: no apparent nausea or vomiting Anesthetic complications: no   No notable events documented.  Last Vitals:  Vitals:   05/23/21 1200 05/23/21 1215  BP: (!) 93/59 (!) 96/58  Pulse: (!) 56 (!) 44  Resp: 17 11  Temp:    SpO2: 95% 93%    Last Pain:  Vitals:   05/23/21 1215  TempSrc:   PainSc: 2                  Seymone Forlenza A.

## 2021-05-23 NOTE — Op Note (Signed)
NAME: [facility], RHINEHART MEDICAL RECORD NO: [id number] ACCOUNT NO: [id number] DATE OF BIRTH: [birth year] FACILITY: [facility] LOCATION: [facility] PHYSICIAN: Alexis Frock, MD  Operative Report   DATE OF PROCEDURE: 05/23/2021  PREOPERATIVE DIAGNOSIS:  Right renal mass. Extensive abdominal surgical history.  PROCEDURE:  Right retroperitoneal robotic partial nephrectomy.  ESTIMATED BLOOD LOSS:  875 mL  COMPLICATIONS:  None.  SPECIMEN:  Right renal mass for permanent pathology.  FINDINGS: 1.  Single artery, single vein, right renal vascular anatomy as anticipated: 2.  Predominantly exophytic right lateral mid renal mass, solid appearing.  DRAINS:  Jackson-Pratt drain was placed to straight drain.  INDICATION:  The patient is a pleasant 65 year old male with history of severe diverticulitis.  He is status post colon resection, colostomy and colostomy takedown.  He was found on the workup of that to have a small right incidental renal mass that  initially was fairly equivocal and small.  He was appropriately placed on surveillance.  However, this mass became more worrisome over time with more enhancement with contrast and size progression out approximately 2.8 cm.  Options were further discussed  including continued surveillance versus ablative therapies versus surgical extirpation.  The patient wished to proceed with curative intent partial nephrectomy given the extensive surgical history intraabdominally, it was felt that retroperitoneal  approach would be safest.  He wished to proceed.  Informed consent was obtained and placed in medical record.  DESCRIPTION OF PROCEDURE:  The patient was being verified, procedure being right robotic partial nephrectomy was confirmed.  Procedure timeout was performed.  Intravenous antibiotics administered.  General endotracheal anesthesia induced.  The patient  was placed in the right side up full flank position and pulling 15 degrees of table flexion.  A  superior elevator, axillary roll, sequential compression devices, bottom leg bent, top leg straight.  Foley catheter was placed per urethra straight drain.   He was further fastened to the operating table using 3-inch tape with foam padding across the supraxiphoid chest and his sacrum and the entire right flank was prepped using chlorhexidine gluconate.  Next, the tip of the 12th rib was identified.  An  incision was made approximately 1.5 cm inferior and slightly medial to this and using a surgeon's finger the subcutaneous tissue and lumbodorsal musculature was dissected down to the lumbodorsal fascia, which was then carefully pierced using a Kelly  forceps spread.  This allowed entrance into the anterior retroperitoneum.  Finger dissection was performed of the retroperitoneum directly on the psoas musculature and posterior to the kidney thus developing initial retroperitoneal space as much as  possible.  The Spacemaker balloon apparatus was then placed in the exact same location and under laparoscopic vision carefully inflated to approximately 50 pumps. The entire insufflation process was carefully visualized. During insufflation the ureter  was identified as were pulsations of the presumed renal vasculature.  A space was created posterior to the kidney as anticipated.  Spacemaker balloon was then released and using finger guidance ports were then placed as follows:  Right medial subcostal 8  mm robotic port just inferior to the 12th rib and just lateral to the psoas musculature, right assistant port site approximately 2.5 cm inferior to the camera port site just lateral to the psoas musculature, 8 mm robotic port site approximately 4 cm  medial to the previously placed entrance port and another robotic port, approximately 3 cm lateral to that.  All these were placed in the retroperitoneum using finger guidance.  The retroperitoneal trocar was then placed.  The anterior balloon inflated  into 30 mL of air  and placed on tension and using a piggyback technique a robotic camera port was placed through this. Laparoscopic examination of the peritoneal cavity revealed excellent development of the retroperitoneal space.  Attention was directed  to that visualization of the hilum using the psoas musculature as the floor in the ureter as a guide, dissection proceeded in this triangle posterior to the renal hilum.  The renal hilum consisted of a single artery, single vein as anticipated.  These  structures were circumferentially mobilized and the renal artery was marked with vessel loop.  Having obtained access and mobilization of the renal artery dissection proceeded laterally at the level of the hilum towards the area of the mass.  The mass  was quite exophytic as anticipated and did have some cystic and solid components.  It was circumferentially mobilized so that there was a rim of exposed parenchyma approximately 1 cm in every direction.  At the border of this warm ischemia was then  achieved, placing two bulldog clamps on the artery alone and partial nephrectomy was performed using cold scissors, keeping it appeared to be a rim of superficial normal parenchyma with the partial nephrectomy specimen, was placed in EndoCatch bag for  later retrieval. First layer renorrhaphy was performed using a running 3-0 V-Loc suture and oversewing several small venous sinuses.  Notably, there was no obvious collecting system entrance.  Bolster was placed and then secondly renorrhaphy was  performed using 0 Vicryl sandwiched between Hem-o-loks and Lapra-Tys over the bolster.  The bulldog clamps were removed.  Hemostasis was excellent. Visual counts were correct.  A close suction drain was brought through this right paramedian robotic port  site in the area of the retroperitoneum, specimen was retrieved by extending the camera port site until there was an approximately 3 cm.  Removing the partial nephrectomy as specimens sent for  Pathology.  The extraction site was closed at the level of  fascia using figure-of-eight PDS x2 followed by reapproximation of Scarpa's with running Vicryl.  All incision sites were infiltrated with dilute lipolyzed Marcaine and closed at the level of the skin using subcuticular Monocryl and Dermabond.  The  procedure was terminated.  The patient tolerated the procedure well.  No immediate perioperative complications.  The patient was taken to postanesthesia care unit in stable condition.   PUS D: 05/23/2021 11:11:46 am T: 05/23/2021 12:24:00 pm  JOB: [id number]/ [id number]

## 2021-05-23 NOTE — Discharge Instructions (Addendum)
IMPORTANT: Keep holding the qliquis (blood thinner) we will rediscuss starting this again during your appointment with [place] on 06/12/20.   1- Drain Sites - You may have some mild persistent drainage from old drain site for several days, this is normal. This can be covered with cotton gauze for convenience.  2 - Stiches - Your stitches are all dissolvable. You may notice a "loose thread" at your incisions, these are normal and require no intervention. You may cut them flush to the skin with fingernail clippers if needed for comfort.  3 - Diet - No restrictions  4 - Activity - No heavy lifting / straining (any activities that require valsalva or "bearing down") x 4 weeks. Otherwise, no restrictions.  5 - Bathing - You may shower immediately. Do not take a bath or get into swimming pool where incision sites are submersed in water x 4 weeks.   6 -  When to Call the Doctor - Call MD for any fever >102, any acute wound problems, or any severe nausea / vomiting. You can call the [facility] [phone]) 24 hours a day 365 days a year. It will roll-over to the answering service and on-call physician after hours.

## 2021-05-23 NOTE — Brief Op Note (Signed)
05/23/2021  11:03 AM  PATIENT:  Arlester Marker  65 y.o. male  PRE-OPERATIVE DIAGNOSIS:  RIGHT RENAL MASS  POST-OPERATIVE DIAGNOSIS:  RIGHT RENAL MASS  PROCEDURE:  Procedure(s): XI RETROPERITONEAL ROBOTIC ASSITED PARTIAL NEPHRECTOMY (Right)  SURGEON:  Surgeon(s) and Role:    Alexis Frock, MD - Primary  PHYSICIAN ASSISTANT:   ASSISTANTS: [place]:   local and general  EBL:  100 mL   BLOOD ADMINISTERED:none  DRAINS:  1 - foley to gravity; 2 - JP to bulb    LOCAL MEDICATIONS USED:  MARCAINE     SPECIMEN:  Source of Specimen:  Rt renal mass  DISPOSITION OF SPECIMEN:  PATHOLOGY  COUNTS:  YES  TOURNIQUET:  * No tourniquets in log *  DICTATION: .Other Dictation: Dictation Number [id number]  PLAN OF CARE: Admit for overnight observation  PATIENT DISPOSITION:  PACU - hemodynamically stable.   Delay start of Pharmacological VTE agent (>24hrs) due to surgical blood loss or risk of bleeding: yes

## 2021-05-23 NOTE — Progress Notes (Addendum)
Port[facility] equipment informed me that they are currently out of SCD pump hoses until further notice. He will keep patient on the list to receive pump and hoses when they get them in stock.

## 2021-05-23 NOTE — Anesthesia Procedure Notes (Signed)
Procedure Name: Intubation Date/Time: 05/23/2021 8:45 AM Performed by: Garrel Ridgel, CRNA Pre-anesthesia Checklist: Patient identified, Emergency Drugs available, Suction available and Patient being monitored Patient Re-evaluated:Patient Re-evaluated prior to induction Oxygen Delivery Method: Circle system utilized Preoxygenation: Pre-oxygenation with 100% oxygen Induction Type: IV induction Ventilation: Mask ventilation without difficulty Laryngoscope Size: Mac and 4 Grade View: Grade I Tube type: Oral Tube size: 7.5 mm Number of attempts: 1 Airway Equipment and Method: Stylet and Oral airway Placement Confirmation: ETT inserted through vocal cords under direct vision, positive ETCO2 and breath sounds checked- equal and bilateral Secured at: 22 cm Tube secured with: Tape Dental Injury: Teeth and Oropharynx as per pre-operative assessment

## 2021-05-23 NOTE — H&P (Signed)
Angel [facility] is an 65 y.o. male.    Chief Complaint: Pre-Op RIGHT Robotic Partial Nephrectomy / Retroperitoneal  HPI:   1 - Right Renal Neoplasm - 2.8cm heterogenous 90% exophytic mid - lateral mass incidetnal on CT then confirmed on contrast MRI late 2021. This is new since imaging in 2006. 1 artery / 1 vein right renovascular anatomy, clinically localized. Although some concernt hemorragic cyst, there is some enhancement and bloodflow wtihin suggesting possible non-indolent neoplasm. Open retroperitoneal window to hilum / tumor. Some small increase in size by CT 03/2021 to 2.9cm, still clinically localized.   PMH sig for AFib/Eliquus (follows Dr. Ellyn [facility] with [facility]), DM2, HTN, Ex-Lap for diverticuulitis with temporary left sided colostomy / then takedown, smoker,. His daughter is cardiac NP in [place]. His PCP is Angel Snow MD with [facility].   Today " Angel Shelton " is seen to proceed with RIGHT robotic partial nephrectomy. No interval fevers. Had asymptomatic C19 screen 12/5 and has new been s/p waiting period.   Past Medical History:  Diagnosis Date   Alcohol abuse    Diabetes mellitus without complication (HCC)    Diverticulosis    GERD (gastroesophageal reflux disease)    Hyperlipidemia    Hypertension    Left atrial enlargement    Neuromuscular disorder (HCC)    neuropathy   Persistent atrial fibrillation (HCC)    CHA2DS2-VASc score 4 (age-61, HTN-1, DM 2-1, aortic plaque)    Past Surgical History:  Procedure Laterality Date   APPENDECTOMY     CARDIAC CATHETERIZATION N/A 01/05/2015   Procedure: Left Heart Cath and Coronary Angiography;  Surgeon: Angel Casino, MD;  Location: [facility];  Service: Cardiovascular;; (For Abnormal Nuclear Stress Test) mild luminal irregularities of the LCx with mild areas of ectasia.  Calcified ostial D1 mild stenosis. ->  Mild/nonobstructive CAD.   CARDIOVERSION N/A 11/01/2014   Procedure: CARDIOVERSION;  Surgeon:  Angel Casino, MD;  Location: [facility];  Service: Cardiovascular;  Laterality: N/A;   CARDIOVERSION N/A 02/24/2015   Procedure: CARDIOVERSION;  Surgeon: Angel Casino, MD;  Location: [facility];  Service: Cardiovascular;  Laterality: N/A;   CATARACT EXTRACTION Bilateral    COLON SURGERY N/A 2006   14 inches removed from diverticitis perforation   COLONOSCOPY     Per stoma   EYE SURGERY Bilateral    Cat Sx   INNER EAR SURGERY     as a child   NM [facility]  12/08/2014   FALSE POSITIVE: Read asINTERMEDIATE RISK.  Partially reversible medium size moderate severity defect in the basal--mid-apical inferoseptal-inferior and apical walls.  Not gated due to A. fib. ->  Referred for cath- NON-OBSTRUCTIVE CAD   TEE WITHOUT CARDIOVERSION N/A 11/01/2014   Procedure: TRANSESOPHAGEAL ECHOCARDIOGRAM (TEE) -unsuccesful DCCV;  Surgeon: Angel Casino, MD;  Location: [facility];  Service: Cardiovascular;; Mild concentric LVH.  EF 55 to 60%.  Normal wall motion.  Grade 3 aortic atheroma.  No source of cardiac emboli.  -->  Followed by unsuccessful DCCV.    Family History  Problem Relation Age of Onset   Heart disease Father    Stroke Father    Diabetes Mother    Diabetes Maternal Grandmother    Stroke Maternal Grandmother    Heart attack Paternal Grandfather    Hodgkin's lymphoma Paternal Aunt    Hodgkin's lymphoma Paternal Uncle    Colon cancer Neg Hx    Esophageal cancer Neg Hx    Pancreatic cancer  Neg Hx    Prostate cancer Neg Hx    Rectal cancer Neg Hx    Stomach cancer Neg Hx    Social History:  reports that he quit smoking about 7 years ago. His smoking use included cigarettes. He has a 30.00 pack-year smoking history. He has never used smokeless tobacco. He reports current alcohol use of about 14.0 standard drinks per week. He reports that he does not use drugs.  Allergies: No Known Allergies  Medications Prior to Admission  Medication Sig Dispense Refill   atorvastatin  (LIPITOR) 20 MG tablet Take 20 mg by mouth daily.     Cyanocobalamin (VITAMIN B-12 PO) Take 1 tablet by mouth daily.     diltiazem (CARDIZEM CD) 240 MG 24 hr capsule Take 240 mg by mouth daily.     ELIQUIS 5 MG TABS tablet Take 5 mg by mouth 2 (two) times daily.  1   fenofibrate 160 MG tablet Take 160 mg by mouth daily.     gabapentin (NEURONTIN) 300 MG capsule Take 300 mg by mouth every 8 (eight) hours.     hydrocortisone (ANUSOL-HC) 25 MG suppository Use 1 suppository at bedtime for 14 days, then repeat as needed. 14 suppository 2   Lactobacillus-Inulin (PROBIOTIC DIGESTIVE SUPPORT PO) Take 1 tablet by mouth daily.     lisinopril (PRINIVIL,ZESTRIL) 20 MG tablet Take 20 mg by mouth daily.  0   metFORMIN (GLUCOPHAGE) 1000 MG tablet Take 1,000 mg by mouth 2 (two) times daily.     metoprolol succinate (TOPROL-XL) 100 MG 24 hr tablet Take 100 mg by mouth daily.  1   Omega-3 Fatty Acids (FISH OIL) 1000 MG CAPS Take 1,200 mg by mouth 2 (two) times daily.     Wheat Dextrin (BENEFIBER DRINK MIX) PACK Take 1 Package by mouth daily.     DILT-XR 240 MG 24 hr capsule take 1 capsule by mouth once daily (Patient not taking: Reported on 04/11/2021) 30 capsule 6    Results for orders placed or performed during the [facility] encounter of 05/23/21 (from the past 48 hour(s))  Basic metabolic panel     Status: Abnormal   Collection Time: 05/23/21  6:30 AM  Result Value Ref Range   Sodium 134 (L) 135 - 145 mmol/L   Potassium 3.9 3.5 - 5.1 mmol/L   Chloride 101 98 - 111 mmol/L   CO2 21 (L) 22 - 32 mmol/L   Glucose, Bld 90 70 - 99 mg/dL    Comment: Glucose reference range applies only to samples taken after fasting for at least 8 hours.   BUN 14 8 - 23 mg/dL   Creatinine, Ser 1.01 0.61 - 1.24 mg/dL   Calcium 9.4 8.9 - 10.3 mg/dL   GFR, Estimated >60 >60 mL/min    Comment: (NOTE) Calculated using the CKD-EPI Creatinine Equation (2021)    Anion gap 12 5 - 15    Comment: Performed at [facility], [place] [address], [place], [place] [address]  Type and screen [place]     Status: None (Preliminary result)   Collection Time: 05/23/21  6:30 AM  Result Value Ref Range   ABO/RH(D) PENDING    Antibody Screen PENDING    Sample Expiration      05/26/2021,2359 Performed at [facility], [place] [address] [place], [place] [address]   Glucose, capillary     Status: None   Collection Time: 05/23/21  6:35 AM  Result Value Ref Range  Glucose-Capillary 83 70 - 99 mg/dL    Comment: Glucose reference range applies only to samples taken after fasting for at least 8 hours.   No results found.  Review of Systems  Constitutional:  Negative for chills and fever.  All other systems reviewed and are negative.  Blood pressure (!) 180/99, pulse (!) 102, temperature 98 F (36.7 C), temperature source Oral, resp. rate 18, height 5\' 11"  (1.803 m), weight 77.1 kg, SpO2 100 %. Physical Exam Vitals reviewed.  HENT:     Nose: Nose normal.  Eyes:     Pupils: Pupils are equal, round, and reactive to light.  Cardiovascular:     Rate and Rhythm: Normal rate.  Abdominal:     General: Abdomen is flat.     Comments: Prior scars w/o hernias.   Genitourinary:    Comments: No CVAT Musculoskeletal:        General: Normal range of motion.     Cervical back: Normal range of motion.  Skin:    General: Skin is warm.  Neurological:     General: No focal deficit present.     Mental Status: He is alert.  Psychiatric:        Mood and Affect: Mood normal.     Assessment/Plan  Proceed as planned with RIGHT retroperitoneal robotic partial nephrectomy. Risks, beneftis, alternatives, expected peri-op course discussed previously and reiterated today.l   Alexis Frock, MD 05/23/2021, 8:01 AM

## 2021-05-23 NOTE — Transfer of Care (Signed)
Immediate Anesthesia Transfer of Care Note  Patient: Angel [facility]  Procedure(s) Performed: XI RETROPERITONEAL ROBOTIC ASSITED PARTIAL NEPHRECTOMY (Right)  Patient Location: PACU  Anesthesia Type:General  Level of Consciousness: awake, alert , oriented and patient cooperative  Airway & Oxygen Therapy: Patient Spontanous Breathing and Patient connected to face mask oxygen  Post-op Assessment: Report given to RN and Post -op Vital signs reviewed and stable  Post vital signs: Reviewed and stable  Last Vitals:  Vitals Value Taken Time  BP 120/66 05/23/21 1117  Temp    Pulse 64 05/23/21 1119  Resp    SpO2 100 % 05/23/21 1119  Vitals shown include unvalidated device data.  Last Pain:  Vitals:   05/23/21 6433  TempSrc: Oral         Complications: No notable events documented.

## 2021-05-24 ENCOUNTER — Encounter (HOSPITAL_COMMUNITY): Payer: Self-pay | Admitting: Urology

## 2021-05-24 DIAGNOSIS — Z7901 Long term (current) use of anticoagulants: Secondary | ICD-10-CM | POA: Diagnosis not present

## 2021-05-24 DIAGNOSIS — C641 Malignant neoplasm of right kidney, except renal pelvis: Secondary | ICD-10-CM | POA: Diagnosis not present

## 2021-05-24 DIAGNOSIS — Z79899 Other long term (current) drug therapy: Secondary | ICD-10-CM | POA: Diagnosis not present

## 2021-05-24 DIAGNOSIS — I4819 Other persistent atrial fibrillation: Secondary | ICD-10-CM | POA: Diagnosis not present

## 2021-05-24 DIAGNOSIS — I10 Essential (primary) hypertension: Secondary | ICD-10-CM | POA: Diagnosis not present

## 2021-05-24 DIAGNOSIS — E119 Type 2 diabetes mellitus without complications: Secondary | ICD-10-CM | POA: Diagnosis not present

## 2021-05-24 DIAGNOSIS — Z87891 Personal history of nicotine dependence: Secondary | ICD-10-CM | POA: Diagnosis not present

## 2021-05-24 DIAGNOSIS — Z7984 Long term (current) use of oral hypoglycemic drugs: Secondary | ICD-10-CM | POA: Diagnosis not present

## 2021-05-24 LAB — BASIC METABOLIC PANEL
Anion gap: 8 (ref 5–15)
Anion gap: 5 (ref 5–15)
BUN: 21 mg/dL (ref 8–23)
BUN: 19 mg/dL (ref 8–23)
CO2: 23 mmol/L (ref 22–32)
CO2: 22 mmol/L (ref 22–32)
Calcium: 8.5 mg/dL — ABNORMAL LOW (ref 8.9–10.3)
Calcium: 8 mg/dL — ABNORMAL LOW (ref 8.9–10.3)
Chloride: 94 mmol/L — ABNORMAL LOW (ref 98–111)
Chloride: 95 mmol/L — ABNORMAL LOW (ref 98–111)
Creatinine, Ser: 1.37 mg/dL — ABNORMAL HIGH (ref 0.61–1.24)
Creatinine, Ser: 1.62 mg/dL — ABNORMAL HIGH (ref 0.61–1.24)
GFR, Estimated: 57 mL/min — ABNORMAL LOW (ref >60)
GFR, Estimated: 47 mL/min — ABNORMAL LOW (ref >60)
Glucose, Bld: 321 mg/dL — ABNORMAL HIGH (ref 70–99)
Glucose, Bld: 331 mg/dL — ABNORMAL HIGH (ref 70–99)
Potassium: 5.2 mmol/L — ABNORMAL HIGH (ref 3.5–5.1)
Potassium: 5.1 mmol/L (ref 3.5–5.1)
Sodium: 125 mmol/L — ABNORMAL LOW (ref 135–145)
Sodium: 122 mmol/L — ABNORMAL LOW (ref 135–145)

## 2021-05-24 LAB — CBC
HCT: 34 % — ABNORMAL LOW (ref 39.0–52.0)
Hemoglobin: 11.7 g/dL — ABNORMAL LOW (ref 13.0–17.0)
MCH: 33.9 pg (ref 26.0–34.0)
MCHC: 34.4 g/dL (ref 30.0–36.0)
MCV: 98.6 fL (ref 80.0–100.0)
Platelets: 164 10*3/uL (ref 150–400)
RBC: 3.45 MIL/uL — ABNORMAL LOW (ref 4.22–5.81)
RDW: 13 % (ref 11.5–15.5)
WBC: 11.1 10*3/uL — ABNORMAL HIGH (ref 4.0–10.5)
nRBC: 0 % (ref 0.0–0.2)

## 2021-05-24 LAB — GLUCOSE, CAPILLARY
Glucose-Capillary: 268 mg/dL — ABNORMAL HIGH (ref 70–99)
Glucose-Capillary: 243 mg/dL — ABNORMAL HIGH (ref 70–99)
Glucose-Capillary: 202 mg/dL — ABNORMAL HIGH (ref 70–99)
Glucose-Capillary: 238 mg/dL — ABNORMAL HIGH (ref 70–99)

## 2021-05-24 MED ORDER — ACETAMINOPHEN 500 MG PO TABS
1000.0000 mg | ORAL_TABLET | Freq: Four times a day (QID) | ORAL | Status: DC
  Administered 2021-05-24 – 2021-05-25 (×3): 1000 mg via ORAL
  Filled 2021-05-24 (×3): qty 2

## 2021-05-24 NOTE — Discharge Summary (Shared)
[facility] Discharge Summary  Admit date: 05/23/2021  Discharge date and time: 05/24/21   Discharge to: Home  Discharge Service: Urology  Discharge Attending Physician:  ***  Discharge  Diagnoses: Renal mass  Secondary Diagnosis: Principal Problem:   Renal mass   OR Procedures: Procedure(s): XI RETROPERITONEAL ROBOTIC ASSITED PARTIAL NEPHRECTOMY 05/23/2021   Ancillary Procedures: None   Discharge Day Services: The patient was seen and examined by the Urology team both in the morning and immediately prior to discharge.  Vital signs and laboratory values were stable and within normal limits.  The physical exam was benign and unchanged and all surgical wounds were examined.  Discharge instructions were explained and all questions answered.  Subjective  No acute events overnight. Pain Controlled. No fever or chills.  Objective Patient Vitals for the past 8 hrs:  BP Temp Temp src Pulse Resp SpO2  05/24/21 0424 123/68 98.1 F (36.7 C) Oral 67 18 99 %  05/24/21 0045 116/67 97.7 F (36.5 C) Oral 65 18 97 %   Total I/O In: 144.9 [I.V.:144.9] Out: -   General Appearance:        No acute distress Lungs:                       Normal work of breathing on room air Heart:                                Regular rate and rhythm Abdomen:                         Soft, non-tender, non-distended*** Extremities:                      Warm and well perfused   Hospital Course:  The patient underwent *** on 05/23/2021.  The patient tolerated the procedure well, was extubated in the OR, and afterwards was taken to the PACU for routine post-surgical care. When stable the patient was transferred to the floor.   The patient did well postoperatively.  The patients diet was slowly advanced and at the time of discharge was tolerating a regular diet.  The patient was discharged home 1 Day Post-Op, at which point was tolerating a regular solid diet, was able to void spontaneously, have adequate  pain control with P.O. pain medication, and could ambulate without difficulty. The patient will follow up with [place] for post op check.   Condition at Discharge: Improved  Discharge Medications:  Allergies as of 05/24/2021   No Known Allergies   Med Rec must be completed prior to using this [facility]***

## 2021-05-24 NOTE — Discharge Summary (Addendum)
[facility] Discharge Summary  Admit date: 05/23/2021  Discharge date and time: 05/25/21   Discharge to: Home  Discharge Service: Urology  Discharge Attending Physician:  Phebe Colla, MD  Discharge  Diagnoses: Renal mass  Secondary Diagnosis: Principal Problem:   Renal mass   OR Procedures: Procedure(s): XI RETROPERITONEAL ROBOTIC ASSITED PARTIAL NEPHRECTOMY 05/23/2021   Ancillary Procedures: None   Discharge Day Services: The patient was seen and examined by the Urology team both in the morning and immediately prior to discharge.  Vital signs and laboratory values were stable and within normal limits.  The physical exam was benign and unchanged and all surgical wounds were examined.  Discharge instructions were explained and all questions answered.  Subjective  No acute events overnight. Pain Controlled. No fever or chills.  Objective Patient Vitals for the past 8 hrs:  BP Temp Temp src Pulse Resp SpO2  05/25/21 0357 (!) 143/95 97.6 F (36.4 C) Oral 60 20 99 %   No intake/output data recorded.  General Appearance:        No acute distress Lungs:                       Normal work of breathing on room air Heart:                                Regular rate and rhythm Abdomen:                         Soft, non-tender, non-distended. Incisions C/D/I Extremities:                      Warm and well perfused   Hospital Course:  The patient underwent Robotic Right Partial Nephrectomy on 05/23/2021.  The patient tolerated the procedure well, was extubated in the OR, and afterwards was taken to the PACU for routine post-surgical care. When stable the patient was transferred to the floor.   The patient did well postoperatively.  The patients diet was slowly advanced and at the time of discharge was tolerating a regular diet.  He was normalized on POD1. JP drain and foley removed on POD1. He was hyponatremic on repeat BMP check. Asymptomatic. Decision made to saline lock the  patient and monitor him over night with repeat labs POD2. Sodium uptrended to 127, creatinine stable at 1.2, Hb stable at 11.7. The patient was discharged home 2 Days Post-Op  at which point was tolerating a regular solid diet, was able to void spontaneously, have adequate pain control with P.O. pain medication, and could ambulate without difficulty. The patient will follow up with [place] for post op check.   Condition at Discharge: Improved  Discharge Medications:  Allergies as of 05/25/2021   No Known Allergies      Medication List     STOP taking these medications    Dilt-XR 240 MG 24 hr capsule Generic drug: diltiazem   Eliquis 5 MG Tabs tablet Generic drug: apixaban       TAKE these medications    atorvastatin 20 MG tablet Commonly known as: LIPITOR Take 20 mg by mouth daily.   Benefiber Drink Mix Pack Take 1 Package by mouth daily.   diltiazem 240 MG 24 hr capsule Commonly known as: CARDIZEM CD Take 240 mg by mouth daily.   fenofibrate 160 MG tablet Take 160 mg by mouth daily.  Fish Oil 1000 MG Caps Take 1,200 mg by mouth 2 (two) times daily.   gabapentin 300 MG capsule Commonly known as: NEURONTIN Take 300 mg by mouth every 8 (eight) hours.   hydrocortisone 25 MG suppository Commonly known as: ANUSOL-HC Use 1 suppository at bedtime for 14 days, then repeat as needed.   lisinopril 20 MG tablet Commonly known as: ZESTRIL Take 20 mg by mouth daily.   metFORMIN 1000 MG tablet Commonly known as: GLUCOPHAGE Take 1,000 mg by mouth 2 (two) times daily.   metoprolol succinate 100 MG 24 hr tablet Commonly known as: TOPROL-XL Take 100 mg by mouth daily.   oxyCODONE-acetaminophen 5-325 MG tablet Commonly known as: Percocet Take 1 tablet by mouth every 6 (six) hours as needed for severe pain or moderate pain (post-operatively).   PROBIOTIC DIGESTIVE SUPPORT PO Take 1 tablet by mouth daily.   senna-docusate 8.6-50 MG tablet Commonly known as:  Senokot-S Take 1 tablet by mouth 2 (two) times daily. While taking strong pain meds to prevent constipation   VITAMIN B-12 PO Take 1 tablet by mouth daily.

## 2021-05-24 NOTE — Progress Notes (Addendum)
Urology Progress Note   1 Day Post-Op from robotic right partial nephrectomy.   Subjective: NAEON.  Vital signs are stable.  Pain is well controlled with as needed analgesics.  Has made 1.5 L of urine output.  Put out 80 cc from his JP drain overnight.  Hemoglobin is 11.7 this morning from 12.4 postoperatively.  Creatinine up to 1.37 and hyponatremic and hyperkalemic on the BMP.  Objective: Vital signs in last 24 hours: Temp:  [97.4 F (36.3 C)-98.1 F (36.7 C)] 98.1 F (36.7 C) (12/29 0424) Pulse Rate:  [31-67] 67 (12/29 0424) Resp:  [11-18] 18 (12/29 0424) BP: (93-123)/(55-80) 123/68 (12/29 0424) SpO2:  [93 %-100 %] 99 % (12/29 0424)  Intake/Output from previous day: 12/28 0701 - 12/29 0700 In: 2633.6 [P.O.:120; I.V.:2413.6; IV Piggyback:100] Out: 1885 [Urine:1525; Drains:260; Blood:100] Intake/Output this shift: Total I/O In: 144.9 [I.V.:144.9] Out: -   Physical Exam:  General: Alert and oriented CV: Regular rate Lungs: No increased work of breathing Abdomen:  Soft, appropriately tender. Incisions c/d/i. JP SS GU: Foley in place draining clear yellow urine  Ext: NT, No erythema  Lab Results: Recent Labs    05/23/21 1146 05/24/21 0453  HGB 12.4* 11.7*  HCT 36.4* 34.0*   Recent Labs    05/23/21 1146 05/24/21 0453  NA 134* 125*  K 4.4 5.2*  CL 103 94*  CO2 21* 23  GLUCOSE 137* 321*  BUN 14 21  CREATININE 1.01 1.37*  CALCIUM 8.7* 8.5*    Studies/Results: No results found.  Assessment/Plan:  65 y.o. male s/p robotic right partial nephrectomy for a 3 cm renal mass.  Overall doing well post-op.   -We will recheck a BMP today -Trial of void -Likely discontinue JP drain today -Advance diet -Stop IV fluids   Dispo: Potential discharge today.   LOS: 0 days    I have seen and examiend the patient and agree with Dr. Charlean Sanfilippo plan.  Briefly,  S: POD 1 s/p RIGHT retroperitoneal robotic partial nephrectomy. Path pending. Tolerating PO and  ambulatory. Pain controlled:  O: NAD, AOx3, pleasant, daughter at bedside Non-labored breathing on RA RRR SNTND, prior scars w/o hernias. Rt flank surgical sites c/d/e, some bruising as expected, no papable hematomas. JP with scant non-foul serosanguinous fluid. Removed and dry dressing placed.  No c/c/e  K 5.1, Cr 1.6, Na 120s (after hypotonic saline last night) Hgb 11's.  A/P: Doing well POD 1. Goals for DC discussed. He is close. WE agree on rechck H/H, BMP in AM tomorrow to verify GFR stable and lytes trending to normal. Hold Lisinopril. Likely most lyte changes artifact, but will verify.

## 2021-05-24 NOTE — Plan of Care (Signed)
°  Problem: Clinical Measurements: °Goal: Respiratory complications will improve °Outcome: Progressing °Goal: Cardiovascular complication will be avoided °Outcome: Progressing °  °Problem: Activity: °Goal: Risk for activity intolerance will decrease °Outcome: Progressing °  °

## 2021-05-25 DIAGNOSIS — C641 Malignant neoplasm of right kidney, except renal pelvis: Secondary | ICD-10-CM | POA: Diagnosis not present

## 2021-05-25 DIAGNOSIS — Z7984 Long term (current) use of oral hypoglycemic drugs: Secondary | ICD-10-CM | POA: Diagnosis not present

## 2021-05-25 DIAGNOSIS — I4819 Other persistent atrial fibrillation: Secondary | ICD-10-CM | POA: Diagnosis not present

## 2021-05-25 DIAGNOSIS — I10 Essential (primary) hypertension: Secondary | ICD-10-CM | POA: Diagnosis not present

## 2021-05-25 DIAGNOSIS — Z7901 Long term (current) use of anticoagulants: Secondary | ICD-10-CM | POA: Diagnosis not present

## 2021-05-25 DIAGNOSIS — Z79899 Other long term (current) drug therapy: Secondary | ICD-10-CM | POA: Diagnosis not present

## 2021-05-25 DIAGNOSIS — E119 Type 2 diabetes mellitus without complications: Secondary | ICD-10-CM | POA: Diagnosis not present

## 2021-05-25 DIAGNOSIS — Z87891 Personal history of nicotine dependence: Secondary | ICD-10-CM | POA: Diagnosis not present

## 2021-05-25 LAB — GLUCOSE, CAPILLARY: Glucose-Capillary: 93 mg/dL (ref 70–99)

## 2021-05-25 LAB — BASIC METABOLIC PANEL
Anion gap: 6 (ref 5–15)
BUN: 19 mg/dL (ref 8–23)
CO2: 24 mmol/L (ref 22–32)
Calcium: 8.8 mg/dL — ABNORMAL LOW (ref 8.9–10.3)
Chloride: 97 mmol/L — ABNORMAL LOW (ref 98–111)
Creatinine, Ser: 1.25 mg/dL — ABNORMAL HIGH (ref 0.61–1.24)
GFR, Estimated: >60 mL/min (ref >60)
Glucose, Bld: 127 mg/dL — ABNORMAL HIGH (ref 70–99)
Potassium: 4.7 mmol/L (ref 3.5–5.1)
Sodium: 127 mmol/L — ABNORMAL LOW (ref 135–145)

## 2021-05-25 LAB — SURGICAL PATHOLOGY

## 2021-05-25 LAB — HEMOGLOBIN AND HEMATOCRIT, BLOOD
HCT: 34.1 % — ABNORMAL LOW (ref 39.0–52.0)
Hemoglobin: 11.7 g/dL — ABNORMAL LOW (ref 13.0–17.0)

## 2021-05-25 MED ORDER — SENNOSIDES-DOCUSATE SODIUM 8.6-50 MG PO TABS: 1.0000 | ORAL_TABLET | Freq: Two times a day (BID) | ORAL | 0 refills | Status: DC

## 2021-05-25 MED ORDER — OXYCODONE-ACETAMINOPHEN 5-325 MG PO TABS: 1.0000 | ORAL_TABLET | Freq: Four times a day (QID) | ORAL | 0 refills | Status: DC | PRN

## 2021-05-25 NOTE — Progress Notes (Signed)
Site where JP drain was removed on pt saturated 2x during night shift. First saturation was bright red and last saturation was serosanguinous .

## 2021-05-25 NOTE — Progress Notes (Signed)
AVS given to patient and explained at the bedside. Medications and follow up appointments have been explained with pt verbalizing understanding.  

## 2021-05-27 LAB — BPAM RBC
Blood Product Expiration Date: 202301162359
Blood Product Expiration Date: 202301222359
Unit Type and Rh: 6200
Unit Type and Rh: 9500

## 2021-05-27 LAB — TYPE AND SCREEN
ABO/RH(D): A POS
Antibody Screen: POSITIVE
PT AG Type: NEGATIVE
Unit division: 0
Unit division: 0

## 2021-05-31 ENCOUNTER — Other Ambulatory Visit: Payer: Self-pay | Admitting: Adult Health

## 2021-05-31 ENCOUNTER — Other Ambulatory Visit (HOSPITAL_COMMUNITY): Payer: Self-pay | Admitting: Adult Health

## 2021-05-31 DIAGNOSIS — I480 Paroxysmal atrial fibrillation: Secondary | ICD-10-CM | POA: Diagnosis not present

## 2021-05-31 DIAGNOSIS — R29810 Facial weakness: Secondary | ICD-10-CM | POA: Diagnosis not present

## 2021-05-31 DIAGNOSIS — N2889 Other specified disorders of kidney and ureter: Secondary | ICD-10-CM | POA: Diagnosis not present

## 2021-05-31 DIAGNOSIS — Q383 Other congenital malformations of tongue: Secondary | ICD-10-CM | POA: Diagnosis not present

## 2021-06-01 ENCOUNTER — Emergency Department (HOSPITAL_COMMUNITY): Payer: Medicare Other

## 2021-06-01 ENCOUNTER — Encounter (HOSPITAL_COMMUNITY): Payer: Self-pay

## 2021-06-01 ENCOUNTER — Other Ambulatory Visit: Payer: Self-pay

## 2021-06-01 ENCOUNTER — Observation Stay (HOSPITAL_COMMUNITY): Payer: Medicare Other

## 2021-06-01 ENCOUNTER — Inpatient Hospital Stay (HOSPITAL_COMMUNITY)
Admission: EM | Admit: 2021-06-01 | Discharge: 2021-06-03 | DRG: 064 | Disposition: A | Discharge status: Home / Self Care | Payer: Medicare Other | Attending: Internal Medicine | Admitting: Internal Medicine

## 2021-06-01 ENCOUNTER — Ambulatory Visit (HOSPITAL_COMMUNITY)
Admission: RE | Admit: 2021-06-01 | Discharge: 2021-06-01 | Disposition: A | Discharge status: Home / Self Care | Payer: Medicare Other | Source: Ambulatory Visit | Attending: Adult Health | Admitting: Adult Health

## 2021-06-01 DIAGNOSIS — E871 Hypo-osmolality and hyponatremia: Secondary | ICD-10-CM | POA: Diagnosis present

## 2021-06-01 DIAGNOSIS — R29702 NIHSS score 2: Secondary | ICD-10-CM | POA: Diagnosis present

## 2021-06-01 DIAGNOSIS — Z79899 Other long term (current) drug therapy: Secondary | ICD-10-CM

## 2021-06-01 DIAGNOSIS — X58XXXA Exposure to other specified factors, initial encounter: Secondary | ICD-10-CM | POA: Diagnosis present

## 2021-06-01 DIAGNOSIS — N189 Chronic kidney disease, unspecified: Secondary | ICD-10-CM | POA: Diagnosis not present

## 2021-06-01 DIAGNOSIS — E119 Type 2 diabetes mellitus without complications: Secondary | ICD-10-CM

## 2021-06-01 DIAGNOSIS — E1122 Type 2 diabetes mellitus with diabetic chronic kidney disease: Secondary | ICD-10-CM | POA: Diagnosis not present

## 2021-06-01 DIAGNOSIS — Z823 Family history of stroke: Secondary | ICD-10-CM | POA: Diagnosis not present

## 2021-06-01 DIAGNOSIS — I639 Cerebral infarction, unspecified: Secondary | ICD-10-CM | POA: Diagnosis not present

## 2021-06-01 DIAGNOSIS — I611 Nontraumatic intracerebral hemorrhage in hemisphere, cortical: Secondary | ICD-10-CM | POA: Diagnosis not present

## 2021-06-01 DIAGNOSIS — T797XXA Traumatic subcutaneous emphysema, initial encounter: Secondary | ICD-10-CM | POA: Diagnosis not present

## 2021-06-01 DIAGNOSIS — E1169 Type 2 diabetes mellitus with other specified complication: Secondary | ICD-10-CM | POA: Diagnosis not present

## 2021-06-01 DIAGNOSIS — N179 Acute kidney failure, unspecified: Secondary | ICD-10-CM | POA: Diagnosis not present

## 2021-06-01 DIAGNOSIS — I69314 Frontal lobe and executive function deficit following cerebral infarction: Secondary | ICD-10-CM | POA: Diagnosis not present

## 2021-06-01 DIAGNOSIS — R4701 Aphasia: Secondary | ICD-10-CM | POA: Diagnosis not present

## 2021-06-01 DIAGNOSIS — I2510 Atherosclerotic heart disease of native coronary artery without angina pectoris: Secondary | ICD-10-CM | POA: Diagnosis present

## 2021-06-01 DIAGNOSIS — Z8249 Family history of ischemic heart disease and other diseases of the circulatory system: Secondary | ICD-10-CM

## 2021-06-01 DIAGNOSIS — Z87891 Personal history of nicotine dependence: Secondary | ICD-10-CM

## 2021-06-01 DIAGNOSIS — I6623 Occlusion and stenosis of bilateral posterior cerebral arteries: Secondary | ICD-10-CM | POA: Diagnosis not present

## 2021-06-01 DIAGNOSIS — R471 Dysarthria and anarthria: Secondary | ICD-10-CM | POA: Diagnosis not present

## 2021-06-01 DIAGNOSIS — I4821 Permanent atrial fibrillation: Secondary | ICD-10-CM | POA: Diagnosis not present

## 2021-06-01 DIAGNOSIS — Z7901 Long term (current) use of anticoagulants: Secondary | ICD-10-CM

## 2021-06-01 DIAGNOSIS — R29810 Facial weakness: Secondary | ICD-10-CM | POA: Diagnosis not present

## 2021-06-01 DIAGNOSIS — E1165 Type 2 diabetes mellitus with hyperglycemia: Secondary | ICD-10-CM | POA: Diagnosis present

## 2021-06-01 DIAGNOSIS — I129 Hypertensive chronic kidney disease with stage 1 through stage 4 chronic kidney disease, or unspecified chronic kidney disease: Secondary | ICD-10-CM | POA: Diagnosis not present

## 2021-06-01 DIAGNOSIS — Z905 Acquired absence of kidney: Secondary | ICD-10-CM

## 2021-06-01 DIAGNOSIS — R233 Spontaneous ecchymoses: Secondary | ICD-10-CM | POA: Diagnosis not present

## 2021-06-01 DIAGNOSIS — Z20822 Contact with and (suspected) exposure to covid-19: Secondary | ICD-10-CM | POA: Diagnosis not present

## 2021-06-01 DIAGNOSIS — I672 Cerebral atherosclerosis: Secondary | ICD-10-CM | POA: Diagnosis not present

## 2021-06-01 DIAGNOSIS — Z7982 Long term (current) use of aspirin: Secondary | ICD-10-CM

## 2021-06-01 DIAGNOSIS — I6389 Other cerebral infarction: Secondary | ICD-10-CM | POA: Diagnosis not present

## 2021-06-01 DIAGNOSIS — N281 Cyst of kidney, acquired: Secondary | ICD-10-CM | POA: Diagnosis not present

## 2021-06-01 DIAGNOSIS — I10 Essential (primary) hypertension: Secondary | ICD-10-CM | POA: Diagnosis not present

## 2021-06-01 DIAGNOSIS — D49511 Neoplasm of unspecified behavior of right kidney: Secondary | ICD-10-CM | POA: Diagnosis present

## 2021-06-01 DIAGNOSIS — R410 Disorientation, unspecified: Secondary | ICD-10-CM | POA: Diagnosis not present

## 2021-06-01 DIAGNOSIS — Z8673 Personal history of transient ischemic attack (TIA), and cerebral infarction without residual deficits: Secondary | ICD-10-CM

## 2021-06-01 DIAGNOSIS — I63233 Cerebral infarction due to unspecified occlusion or stenosis of bilateral carotid arteries: Secondary | ICD-10-CM | POA: Diagnosis not present

## 2021-06-01 DIAGNOSIS — E785 Hyperlipidemia, unspecified: Secondary | ICD-10-CM | POA: Diagnosis present

## 2021-06-01 DIAGNOSIS — K219 Gastro-esophageal reflux disease without esophagitis: Secondary | ICD-10-CM | POA: Diagnosis present

## 2021-06-01 DIAGNOSIS — N1831 Chronic kidney disease, stage 3a: Secondary | ICD-10-CM | POA: Diagnosis not present

## 2021-06-01 DIAGNOSIS — Z7984 Long term (current) use of oral hypoglycemic drugs: Secondary | ICD-10-CM

## 2021-06-01 HISTORY — DX: Cerebral infarction, unspecified: I63.9

## 2021-06-01 LAB — COMPREHENSIVE METABOLIC PANEL
ALT: 16 U/L (ref 0–44)
AST: 32 U/L (ref 15–41)
Albumin: 4.2 g/dL (ref 3.5–5.0)
Alkaline Phosphatase: 43 U/L (ref 38–126)
Anion gap: 11 (ref 5–15)
BUN: 25 mg/dL — ABNORMAL HIGH (ref 8–23)
CO2: 28 mmol/L (ref 22–32)
Calcium: 10.6 mg/dL — ABNORMAL HIGH (ref 8.9–10.3)
Chloride: 91 mmol/L — ABNORMAL LOW (ref 98–111)
Creatinine, Ser: 1.74 mg/dL — ABNORMAL HIGH (ref 0.61–1.24)
GFR, Estimated: 43 mL/min — ABNORMAL LOW (ref >60)
Glucose, Bld: 319 mg/dL — ABNORMAL HIGH (ref 70–99)
Potassium: 5.1 mmol/L (ref 3.5–5.1)
Sodium: 130 mmol/L — ABNORMAL LOW (ref 135–145)
Total Bilirubin: 0.8 mg/dL (ref 0.3–1.2)
Total Protein: 7.3 g/dL (ref 6.5–8.1)

## 2021-06-01 LAB — APTT: aPTT: 28 seconds (ref 24–36)

## 2021-06-01 LAB — CBC
HCT: 40.9 % (ref 39.0–52.0)
Hemoglobin: 13.9 g/dL (ref 13.0–17.0)
MCH: 33.7 pg (ref 26.0–34.0)
MCHC: 34 g/dL (ref 30.0–36.0)
MCV: 99.3 fL (ref 80.0–100.0)
Platelets: 259 10*3/uL (ref 150–400)
RBC: 4.12 MIL/uL — ABNORMAL LOW (ref 4.22–5.81)
RDW: 12.3 % (ref 11.5–15.5)
WBC: 8 10*3/uL (ref 4.0–10.5)
nRBC: 0 % (ref 0.0–0.2)

## 2021-06-01 LAB — URINALYSIS, ROUTINE W REFLEX MICROSCOPIC
Bacteria, UA: NONE SEEN
Bilirubin Urine: NEGATIVE
Glucose, UA: 50 mg/dL — AB
Ketones, ur: NEGATIVE mg/dL
Leukocytes,Ua: NEGATIVE
Nitrite: NEGATIVE
Protein, ur: 100 mg/dL — AB
Specific Gravity, Urine: 1.023 (ref 1.005–1.030)
pH: 8 (ref 5.0–8.0)

## 2021-06-01 LAB — DIFFERENTIAL
Abs Immature Granulocytes: 0.09 10*3/uL — ABNORMAL HIGH (ref 0.00–0.07)
Basophils Absolute: 0.1 10*3/uL (ref 0.0–0.1)
Basophils Relative: 1 %
Eosinophils Absolute: 0.3 10*3/uL (ref 0.0–0.5)
Eosinophils Relative: 4 %
Immature Granulocytes: 1 %
Lymphocytes Relative: 27 %
Lymphs Abs: 2.1 10*3/uL (ref 0.7–4.0)
Monocytes Absolute: 1 10*3/uL (ref 0.1–1.0)
Monocytes Relative: 13 %
Neutro Abs: 4.3 10*3/uL (ref 1.7–7.7)
Neutrophils Relative %: 54 %

## 2021-06-01 LAB — ETHANOL: Alcohol, Ethyl (B): <10 mg/dL (ref <10)

## 2021-06-01 LAB — RESP PANEL BY RT-PCR (FLU A&B, COVID) ARPGX2
Influenza A by PCR: NEGATIVE
Influenza B by PCR: NEGATIVE
SARS Coronavirus 2 by RT PCR: NEGATIVE

## 2021-06-01 LAB — RAPID URINE DRUG SCREEN, HOSP PERFORMED
Amphetamines: NOT DETECTED
Barbiturates: NOT DETECTED
Benzodiazepines: NOT DETECTED
Cocaine: NOT DETECTED
Opiates: NOT DETECTED
Tetrahydrocannabinol: NOT DETECTED

## 2021-06-01 LAB — PROTIME-INR
INR: 1 (ref 0.8–1.2)
Prothrombin Time: 13.2 seconds (ref 11.4–15.2)

## 2021-06-01 MED ORDER — ACETAMINOPHEN 650 MG RE SUPP: 650.0000 mg | RECTAL | Status: DC | PRN

## 2021-06-01 MED ORDER — ATORVASTATIN CALCIUM 20 MG PO TABS
20.0000 mg | ORAL_TABLET | Freq: Every day | ORAL | Status: DC
  Administered 2021-06-02 – 2021-06-03 (×2): 20 mg via ORAL
  Filled 2021-06-01: qty 2
  Filled 2021-06-01: qty 1

## 2021-06-01 MED ORDER — ACETAMINOPHEN 325 MG PO TABS: 650.0000 mg | ORAL_TABLET | ORAL | Status: DC | PRN

## 2021-06-01 MED ORDER — STROKE: EARLY STAGES OF RECOVERY BOOK
Freq: Once | Status: DC
  Filled 2021-06-01: qty 1

## 2021-06-01 MED ORDER — SENNOSIDES-DOCUSATE SODIUM 8.6-50 MG PO TABS: 1.0000 | ORAL_TABLET | Freq: Every evening | ORAL | Status: DC | PRN

## 2021-06-01 MED ORDER — INSULIN ASPART 100 UNIT/ML IJ SOLN
0.0000 [IU] | Freq: Three times a day (TID) | INTRAMUSCULAR | Status: DC
  Administered 2021-06-02 (×3): 3 [IU] via SUBCUTANEOUS
  Administered 2021-06-03: 5 [IU] via SUBCUTANEOUS
  Administered 2021-06-03: 2 [IU] via SUBCUTANEOUS
  Filled 2021-06-01: qty 0.15

## 2021-06-01 MED ORDER — ACETAMINOPHEN 160 MG/5ML PO SOLN: 650.0000 mg | ORAL | Status: DC | PRN

## 2021-06-01 MED ORDER — METOPROLOL SUCCINATE ER 100 MG PO TB24
100.0000 mg | ORAL_TABLET | Freq: Every day | ORAL | Status: DC
  Administered 2021-06-01 – 2021-06-03 (×3): 100 mg via ORAL
  Filled 2021-06-01: qty 2
  Filled 2021-06-01: qty 1
  Filled 2021-06-01: qty 2

## 2021-06-01 MED ORDER — GADOBUTROL 1 MMOL/ML IV SOLN
7.5000 mL | Freq: Once | INTRAVENOUS | Status: AC | PRN
  Administered 2021-06-01: 7.5 mL via INTRAVENOUS

## 2021-06-01 MED ORDER — LISINOPRIL 10 MG PO TABS: 20.0000 mg | ORAL_TABLET | Freq: Every day | ORAL | Status: DC

## 2021-06-01 MED ORDER — DILTIAZEM HCL ER COATED BEADS 240 MG PO CP24
240.0000 mg | ORAL_CAPSULE | Freq: Every day | ORAL | Status: DC
  Administered 2021-06-01 – 2021-06-03 (×3): 240 mg via ORAL
  Filled 2021-06-01: qty 1
  Filled 2021-06-01 (×2): qty 2

## 2021-06-01 MED ORDER — IOHEXOL 350 MG/ML SOLN
60.0000 mL | Freq: Once | INTRAVENOUS | Status: AC | PRN
  Administered 2021-06-01: 60 mL via INTRAVENOUS

## 2021-06-01 MED ORDER — SODIUM CHLORIDE 0.9 % IV SOLN: INTRAVENOUS | Status: AC

## 2021-06-01 MED ORDER — INSULIN ASPART 100 UNIT/ML IJ SOLN
0.0000 [IU] | Freq: Every day | INTRAMUSCULAR | Status: DC
  Filled 2021-06-01: qty 0.05

## 2021-06-01 NOTE — ED Triage Notes (Signed)
Patient states that he began having issues with slobbering on himself and left sided weakness x 2 days. Patient states that he went to Dr. Raul Del office and a MRI was scheduled today. Patient states when he got home he was notified by his physician that he had had a stroke. Patient does have left facial droop and slurred speech. EDPA notified in triage of the patient's symptoms and test results.

## 2021-06-01 NOTE — ED Notes (Signed)
Pt to CT at this time.

## 2021-06-01 NOTE — ED Notes (Signed)
Patients daughter updated.  

## 2021-06-01 NOTE — ED Provider Notes (Signed)
[facility] DEPT Provider Note   CSN: [id number] Arrival date & time: 06/01/21  1511     History  No chief complaint on file.   Angel Shelton is a 66 y.o. male.  Patient is a 66 year old male with a history of diabetes, hypertension, hyperlipidemia, alcohol abuse, GERD, persistent atrial fibrillation on Eliquis who has not had Eliquis since 04/20/2021 because of having a biopsy on a lesion on his kidney who presents today with concern that he may be having a stroke.  Patient reports that 2 days ago he started noticing that the left side of his face was drooping he had slurred speech and it just did not seem his normal.  He denies any visual changes, unilateral weakness or numbness.  He has had no difficulty walking.  He denies any chest pain, shortness of breath, abdominal pain, nausea or vomiting.  He was not sure when he was supposed to restart the Eliquis but has been taking all of his other medication.  He took all of his medications yesterday but has not had them once today because he had seen his doctor yesterday and was evaluated by the NP.  He had an MRI ordered for today which he went to and he reports by the time he got home they called him and told him he had bleeding on the brain and needed to go to the hospital.  He denies any changes today from his symptoms that started 2 days ago.  The history is provided by the patient.      Home Medications Prior to Admission medications   Medication Sig Start Date End Date Taking? Authorizing Provider  atorvastatin (LIPITOR) 20 MG tablet Take 20 mg by mouth daily.    [provider]  Cyanocobalamin (VITAMIN B-12 PO) Take 1 tablet by mouth daily.    [provider]  diltiazem (CARDIZEM CD) 240 MG 24 hr capsule Take 240 mg by mouth daily. 03/28/21   [provider]  fenofibrate 160 MG tablet Take 160 mg by mouth daily.    [provider]  gabapentin (NEURONTIN) 300 MG  capsule Take 300 mg by mouth every 8 (eight) hours. 04/02/21   [provider]  hydrocortisone (ANUSOL-HC) 25 MG suppository Use 1 suppository at bedtime for 14 days, then repeat as needed. 05/12/19   Gatha Mayer, MD  Lactobacillus-Inulin (PROBIOTIC DIGESTIVE SUPPORT PO) Take 1 tablet by mouth daily.    [provider]  lisinopril (PRINIVIL,ZESTRIL) 20 MG tablet Take 20 mg by mouth daily. 07/02/14   [provider]  metFORMIN (GLUCOPHAGE) 1000 MG tablet Take 1,000 mg by mouth 2 (two) times daily. 09/09/19   [provider]  metoprolol succinate (TOPROL-XL) 100 MG 24 hr tablet Take 100 mg by mouth daily. 09/21/14   [provider]  Omega-3 Fatty Acids (FISH OIL) 1000 MG CAPS Take 1,200 mg by mouth 2 (two) times daily.    [provider]  oxyCODONE-acetaminophen (PERCOCET) 5-325 MG tablet Take 1 tablet by mouth every 6 (six) hours as needed for severe pain or moderate pain (post-operatively). 05/25/21 05/25/22  Alexis Frock, MD  senna-docusate (SENOKOT-S) 8.6-50 MG tablet Take 1 tablet by mouth 2 (two) times daily. While taking strong pain meds to prevent constipation 05/25/21   Alexis Frock, MD  Wheat Dextrin (BENEFIBER DRINK MIX) PACK Take 1 Package by mouth daily.    [provider]      Allergies    Patient has no known allergies.  Review of Systems   Review of Systems  Physical Exam Updated Vital Signs BP (!) 166/96    Pulse (!) 102    Temp 98.1 F (36.7 C) (Oral)    Resp 20    Ht 5\' 11"  (1.610 m)    Wt 72.6 kg    SpO2 99%    BMI 22.32 kg/m  Physical Exam Vitals and nursing note reviewed.  Constitutional:      General: He is not in acute distress.    Appearance: Normal appearance. He is well-developed.  HENT:     Head: Normocephalic and atraumatic.     Mouth/Throat:     Mouth: Mucous membranes are moist.  Eyes:     General: No visual field deficit.    Conjunctiva/sclera: Conjunctivae normal.     Pupils: Pupils  are equal, round, and reactive to light.  Cardiovascular:     Rate and Rhythm: Normal rate. Rhythm irregularly irregular.     Pulses: Normal pulses.     Heart sounds: No murmur heard. Pulmonary:     Effort: Pulmonary effort is normal. No respiratory distress.     Breath sounds: Normal breath sounds. No wheezing or rales.  Abdominal:     General: There is no distension.     Palpations: Abdomen is soft.     Tenderness: There is no abdominal tenderness. There is no guarding or rebound.  Musculoskeletal:        General: No tenderness. Normal range of motion.     Cervical back: Normal range of motion and neck supple.  Skin:    General: Skin is warm and dry.     Findings: No erythema or rash.  Neurological:     Mental Status: He is alert and oriented to person, place, and time.     Cranial Nerves: Cranial nerve deficit and facial asymmetry present.     Sensory: Sensation is intact.     Motor: Motor function is intact. No weakness or pronator drift.     Coordination: Coordination is intact. Finger-Nose-Finger Test and Heel to [place] Test normal.     Gait: Gait is intact.     Comments: Mild slurred speech and left-sided facial droop.  No aphasia.  Psychiatric:        Mood and Affect: Mood normal.        Behavior: Behavior normal.    ED Results / Procedures / Treatments   Labs (all labs ordered are listed, but only abnormal results are displayed) Labs Reviewed  CBC - Abnormal; Notable for the following components:      Result Value   RBC 4.12 (*)    All other components within normal limits  DIFFERENTIAL - Abnormal; Notable for the following components:   Abs Immature Granulocytes 0.09 (*)    All other components within normal limits  COMPREHENSIVE METABOLIC PANEL - Abnormal; Notable for the following components:   Sodium 130 (*)    Chloride 91 (*)    Glucose, Bld 319 (*)    BUN 25 (*)    Creatinine, Ser 1.74 (*)    Calcium 10.6 (*)    GFR, Estimated 43 (*)    All other  components within normal limits  RESP PANEL BY RT-PCR (FLU A&B, COVID) ARPGX2  ETHANOL  PROTIME-INR  APTT  RAPID URINE DRUG SCREEN, HOSP PERFORMED  URINALYSIS, ROUTINE W REFLEX MICROSCOPIC    EKG None  Radiology CT Angio Head W or Wo Contrast  Result Date: 06/01/2021 CLINICAL DATA:  Stroke follow-up  EXAM: CT ANGIOGRAPHY HEAD AND NECK TECHNIQUE: Multidetector CT imaging of the head and neck was performed using the standard protocol during bolus administration of intravenous contrast. Multiplanar CT image reconstructions and MIPs were obtained to evaluate the vascular anatomy. Carotid stenosis measurements (when applicable) are obtained utilizing NASCET criteria, using the distal internal carotid diameter as the denominator. CONTRAST:  44mL OMNIPAQUE IOHEXOL 350 MG/ML SOLN COMPARISON:  Brain MRI 06/01/2021 FINDINGS: CT HEAD FINDINGS Brain: Petechial hemorrhage in the right frontal lobe at the area abnormality described on the earlier MRI. No mass effect. There is generalized atrophy without lobar predilection. There is hypoattenuation of the periventricular white matter, most commonly indicating chronic ischemic microangiopathy. Skull: The visualized skull base, calvarium and extracranial soft tissues are normal. Sinuses/Orbits: No fluid levels or advanced mucosal thickening of the visualized paranasal sinuses. No mastoid or middle ear effusion. The orbits are normal. CTA NECK FINDINGS SKELETON: There is no bony spinal canal stenosis. No lytic or blastic lesion. OTHER NECK: Large amount of subcutaneous gas in the visualized upper chest and the neck. UPPER CHEST: Large apical bullae.  Emphysema. AORTIC ARCH: There is calcific atherosclerosis of the aortic arch. There is no aneurysm, dissection or hemodynamically significant stenosis of the visualized portion of the aorta. Conventional 3 vessel aortic branching pattern. The visualized proximal subclavian arteries are widely patent. RIGHT CAROTID SYSTEM: No  dissection, occlusion or aneurysm. Mild atherosclerotic calcification at the carotid bifurcation without hemodynamically significant stenosis. LEFT CAROTID SYSTEM: No dissection, occlusion or aneurysm. Mild atherosclerotic calcification at the carotid bifurcation without hemodynamically significant stenosis. VERTEBRAL ARTERIES: Codominant configuration. Calcification at the origin of the right vertebral artery with moderate narrowing. Right vertebral artery is otherwise normal. The left vertebral artery is normal along its entire course. CTA HEAD FINDINGS POSTERIOR CIRCULATION: --Vertebral arteries: Normal V4 segments. --Inferior cerebellar arteries: Normal. --Basilar artery: Normal. --Superior cerebellar arteries: Normal. --Posterior cerebral arteries (PCA): Left PCA is occluded at the P1 segment. Right PCA is normal. ANTERIOR CIRCULATION: --Intracranial internal carotid arteries: Normal. --Anterior cerebral arteries (ACA): Normal. Both A1 segments are present. Patent anterior communicating artery (a-comm). --Middle cerebral arteries (MCA): Normal. VENOUS SINUSES: As permitted by contrast timing, patent. ANATOMIC VARIANTS: None Review of the MIP images confirms the above findings. IMPRESSION: 1. 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 2. Petechial hemorrhage in the right frontal lobe at the area abnormality described on the earlier MRI. No mass effect. 3. Large amount of subcutaneous gas in the visualized upper chest and neck. This may be secondary to a ruptured apical bulla. Aortic Atherosclerosis (ICD10-I70.0) and Emphysema (ICD10-J43.9). Electronically Signed   By: Ulyses Jarred M.D.   On: 06/01/2021 19:25   CT Angio Neck W and/or Wo Contrast  Result Date: 06/01/2021 CLINICAL DATA:  Stroke follow-up EXAM: CT ANGIOGRAPHY HEAD AND NECK TECHNIQUE: Multidetector CT imaging of the head and neck was  performed using the standard protocol during bolus administration of intravenous contrast. Multiplanar CT image reconstructions and MIPs were obtained to evaluate the vascular anatomy. Carotid stenosis measurements (when applicable) are obtained utilizing NASCET criteria, using the distal internal carotid diameter as the denominator. CONTRAST:  26mL OMNIPAQUE IOHEXOL 350 MG/ML SOLN COMPARISON:  Brain MRI 06/01/2021 FINDINGS: CT HEAD FINDINGS Brain: Petechial hemorrhage in the right frontal lobe at the area abnormality described on the earlier MRI. No mass effect. There is generalized atrophy without lobar predilection. There is  hypoattenuation of the periventricular white matter, most commonly indicating chronic ischemic microangiopathy. Skull: The visualized skull base, calvarium and extracranial soft tissues are normal. Sinuses/Orbits: No fluid levels or advanced mucosal thickening of the visualized paranasal sinuses. No mastoid or middle ear effusion. The orbits are normal. CTA NECK FINDINGS SKELETON: There is no bony spinal canal stenosis. No lytic or blastic lesion. OTHER NECK: Large amount of subcutaneous gas in the visualized upper chest and the neck. UPPER CHEST: Large apical bullae.  Emphysema. AORTIC ARCH: There is calcific atherosclerosis of the aortic arch. There is no aneurysm, dissection or hemodynamically significant stenosis of the visualized portion of the aorta. Conventional 3 vessel aortic branching pattern. The visualized proximal subclavian arteries are widely patent. RIGHT CAROTID SYSTEM: No dissection, occlusion or aneurysm. Mild atherosclerotic calcification at the carotid bifurcation without hemodynamically significant stenosis. LEFT CAROTID SYSTEM: No dissection, occlusion or aneurysm. Mild atherosclerotic calcification at the carotid bifurcation without hemodynamically significant stenosis. VERTEBRAL ARTERIES: Codominant configuration. Calcification at the origin of the right vertebral  artery with moderate narrowing. Right vertebral artery is otherwise normal. The left vertebral artery is normal along its entire course. CTA HEAD FINDINGS POSTERIOR CIRCULATION: --Vertebral arteries: Normal V4 segments. --Inferior cerebellar arteries: Normal. --Basilar artery: Normal. --Superior cerebellar arteries: Normal. --Posterior cerebral arteries (PCA): Left PCA is occluded at the P1 segment. Right PCA is normal. ANTERIOR CIRCULATION: --Intracranial internal carotid arteries: Normal. --Anterior cerebral arteries (ACA): Normal. Both A1 segments are present. Patent anterior communicating artery (a-comm). --Middle cerebral arteries (MCA): Normal. VENOUS SINUSES: As permitted by contrast timing, patent. ANATOMIC VARIANTS: None Review of the MIP images confirms the above findings. IMPRESSION: 1. 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 2. Petechial hemorrhage in the right frontal lobe at the area abnormality described on the earlier MRI. No mass effect. 3. Large amount of subcutaneous gas in the visualized upper chest and neck. This may be secondary to a ruptured apical bulla. Aortic Atherosclerosis (ICD10-I70.0) and Emphysema (ICD10-J43.9). Electronically Signed   By: Ulyses Jarred M.D.   On: 06/01/2021 19:25   MR BRAIN W WO CONTRAST  Result Date: 06/01/2021 CLINICAL DATA:  Left facial droop, confusion, history of renal mass EXAM: MRI HEAD WITHOUT AND WITH CONTRAST TECHNIQUE: Multiplanar, multiecho pulse sequences of the brain and surrounding structures were obtained without and with intravenous contrast. CONTRAST:  7.44mL GADAVIST GADOBUTROL 1 MMOL/ML IV SOLN COMPARISON:  None. FINDINGS: Brain: There is heterogeneous cortical/subcortical diffusion signal in the right frontal lobe with involvement of the lateral precentral gyrus. There is corresponding susceptibility. Curvilinear enhancement is present.  There is a possible developmental venous anomaly in this region. No nodular enhancement. Prominence of the ventricles and sulci reflects parenchymal volume loss. Additional patchy T2 hyperintensity in the supratentorial white matter is nonspecific but may reflect mild chronic microvascular ischemic changes. There is no additional abnormal enhancement. Vascular: Major vessel flow voids at the skull base are preserved. Skull and upper cervical spine: Normal marrow signal is preserved. Sinuses/Orbits: Paranasal sinuses are aerated. Bilateral lens replacements. Other: Sella is unremarkable. Minimal patchy right mastoid fluid opacification. IMPRESSION: Acute hemorrhage in the right frontal lobe involving the lateral precentral gyrus. May reflect hemorrhagic conversion of a subacute ischemic infarct given timing of symptoms. No nodular enhancement to suggest underlying metastatic lesion. There is a possible developmental venous anomaly in this region and therefore hemorrhage secondary to a cavernous malformation is also  a differential consideration. These results were called by telephone at the time of interpretation on 06/01/2021 at 1:18 pm to provider [facility] , who verbally acknowledged these results. Electronically Signed   By: Macy Mis M.D.   On: 06/01/2021 13:19    Procedures Procedures    Medications Ordered in ED Medications  diltiazem (CARDIZEM CD) 24 hr capsule 240 mg (has no administration in time range)  lisinopril (ZESTRIL) tablet 20 mg (has no administration in time range)  iohexol (OMNIPAQUE) 350 MG/ML injection 60 mL (60 mLs Intravenous Contrast Given 06/01/21 1855)    ED Course/ Medical Decision Making/ A&P                           Medical Decision Making  Patient is a 66 year old male with multiple medical problems presenting today with symptoms concerning for a stroke.  He is having facial droop, slurred speech but no other focal findings.  Patient had an MRI today which I  reviewed his external records and it shows a hemorrhagic stroke in the frontal lobe which is thought to be hemorrhagic conversion of an ischemic stroke.  Patient has been off Eliquis since 04/20/2021 after having a biopsy of his kidney.  Patient is hemodynamically stable at this time and awake and alert.  Patient's blood pressure is stable at 147/83.  Labs are pending.  I personally evaluated the patient's EKG and he has persistent atrial fibrillation without acute changes.   Discussed the findings with the patient.  We will consult neurology for further care.  8:13 PM Spoke with Dr. Tilman Neat who evaluated the pt's CTA of the head and neck which does show chronic occlusion and new petechial hemorrhage.  He recommends holding eliquis and asa.  Transfer to [facility] stroke work up and repeat imaging tomorrow to confirm no worsening bleed.  Pt BP goals are <140.  He had not had BP meds today and was given home dose as most recent bp was elevated at 166/96.  Patient has been on the rhythm monitor constantly and based on my evaluation of the cardiac monitor he has been in atrial fibrillation with low rates of 100.  Findings and plan were discussed with the patient.  All of his questions were answered.  Based on his significant comorbidities and findings today he requires admission for further care.        Final Clinical Impression(s) / ED Diagnoses Final diagnoses:  Ischemic stroke of frontal lobe [facility])    Rx / DC Orders ED Discharge Orders     None         Blanchie Dessert, MD 06/01/21 2016

## 2021-06-01 NOTE — H&P (Signed)
History and Physical    Angel Shelton WUJ:[id number] DOB: 06-25-1955 DOA: 06/01/2021  PCP: Ginger Organ., MD  Patient coming from: Home  I have personally briefly reviewed patient's old medical records in [place]  Chief Complaint: Stroke  HPI: Angel Shelton is a 66 y.o. male with medical history significant for persistent atrial fibrillation on Eliquis (last dose 05/20/2021), T2DM, HTN, HLD, CKD stage IIIa, right renal neoplasm s/p robotic assisted partial nephrectomy 05/23/2021 who presented to the ED for evaluation of stroke.  Patient has persistent atrial fibrillation.  He has been on Eliquis anticoagulation.  This was held on 05/20/2021 in preparation for partial right renal nephrectomy which was performed on 05/23/2021.  He has not taken Eliquis since then.  Patient states that the same night after his nephrectomy he developed new onset of expressive aphasia.  He says he knew what he wanted to say but could not get the right words out.  His speech was slurred.  He noticed that his left face was drooping.  He did not have any weakness in his extremities or change in sensation.  He ultimately saw his PCP on 1/5.  MRI brain was ordered and completed earlier today 1/6.  This showed changes suggestive of subacute ischemic infarct in the right frontal lobe with acute hemorrhagic conversion.  Patient was notified of the results and he presented to the ED for further evaluation.  Patient still has some slurred speech which is otherwise fluent.  Left facial droop persist.  He otherwise has no other complaints.  He denies any chest pain, palpitations, dyspnea, cough, nausea, vomiting, abdominal pain, dysuria, diarrhea, focal weakness, change in sensation.  ED Course:  Initial vitals showedInitial BP 126/77, pulse 103, RR 11, temp 98.1 F, SPO2 96% on room air.  Labs show WBC 8.0, hemoglobin 13.9, platelets 259,000, sodium 130 (135 when corrected for hyperglycemia), potassium  5.1, chloride 91, bicarb 28, BUN 25, creatinine 1.74, serum glucose 319, calcium 10.6, LFTs within normal limits, serum ethanol undetectable.  SARS-CoV-2 and influenza PCR negative.  CTA head/neck shows occlusion of the left posterior cerebral artery at the P1 segment, favored to be chronic given lack of acute ischemia and prior MRI.  Petechial hemorrhage in the right frontal lobe noted.  Large amount of subcutaneous gas in the visualized upper chest and neck, may be secondary to ruptured apical bulla.  Patient was given his home diltiazem to 40 mg, lisinopril 20 mg, Toprol-XL 100 mg.  Neurology were consulted and recommended medical admission at [facility], holding aspirin and Eliquis.  The hospitalist service was consulted to admit for further evaluation and management.  Review of Systems: All systems reviewed and are negative except as documented in history of present illness above.   Past Medical History:  Diagnosis Date   Alcohol abuse    Diabetes mellitus without complication (HCC)    Diverticulosis    GERD (gastroesophageal reflux disease)    Hyperlipidemia    Hypertension    Left atrial enlargement    Neuromuscular disorder (HCC)    neuropathy   Persistent atrial fibrillation (HCC)    CHA2DS2-VASc score 4 (age-65, HTN-1, DM 2-1, aortic plaque)    Past Surgical History:  Procedure Laterality Date   APPENDECTOMY     CARDIAC CATHETERIZATION N/A 01/05/2015   Procedure: Left Heart Cath and Coronary Angiography;  Surgeon: Pixie Casino, MD;  Location: [place] [facility];  Service: Cardiovascular;; (For Abnormal Nuclear Stress Test) mild luminal irregularities  of the LCx with mild areas of ectasia.  Calcified ostial D1 mild stenosis. ->  Mild/nonobstructive CAD.   CARDIOVERSION N/A 11/01/2014   Procedure: CARDIOVERSION;  Surgeon: Pixie Casino, MD;  Location: [facility] ENDOSCOPY;  Service: Cardiovascular;  Laterality: N/A;   CARDIOVERSION N/A 02/24/2015   Procedure:  CARDIOVERSION;  Surgeon: Pixie Casino, MD;  Location: [facility] ENDOSCOPY;  Service: Cardiovascular;  Laterality: N/A;   CATARACT EXTRACTION Bilateral    COLON SURGERY N/A 2006   14 inches removed from diverticitis perforation   COLONOSCOPY     Per stoma   EYE SURGERY Bilateral    Cat Sx   INNER EAR SURGERY     as a child   NM MYOVIEW LTD  12/08/2014   FALSE POSITIVE: Read asINTERMEDIATE RISK.  Partially reversible medium size moderate severity defect in the basal--mid-apical inferoseptal-inferior and apical walls.  Not gated due to A. fib. ->  Referred for cath- NON-OBSTRUCTIVE CAD   ROBOTIC ASSITED PARTIAL NEPHRECTOMY Right 05/23/2021   Procedure: XI RETROPERITONEAL ROBOTIC ASSITED PARTIAL NEPHRECTOMY;  Surgeon: Alexis Frock, MD;  Location: [facility];  Service: Urology;  Laterality: Right;   TEE WITHOUT CARDIOVERSION N/A 11/01/2014   Procedure: TRANSESOPHAGEAL ECHOCARDIOGRAM (TEE) -unsuccesful DCCV;  Surgeon: Pixie Casino, MD;  Location: [place];  Service: Cardiovascular;; Mild concentric LVH.  EF 55 to 60%.  Normal wall motion.  Grade 3 aortic atheroma.  No source of cardiac emboli.  -->  Followed by unsuccessful DCCV.    Social History:  reports that he quit smoking about 7 years ago. His smoking use included cigarettes. He has a 30.00 pack-year smoking history. He has never used smokeless tobacco. He reports current alcohol use of about 14.0 standard drinks per week. He reports that he does not use drugs.  No Known Allergies  Family History  Problem Relation Age of Onset   Heart disease Father    Stroke Father    Diabetes Mother    Diabetes Maternal Grandmother    Stroke Maternal Grandmother    Heart attack Paternal Grandfather    Hodgkin's lymphoma Paternal Aunt    Hodgkin's lymphoma Paternal Uncle    Colon cancer Neg Hx    Esophageal cancer Neg Hx    Pancreatic cancer Neg Hx    Prostate cancer Neg Hx    Rectal cancer Neg Hx    Stomach cancer Neg Hx      Prior to  Admission medications   Medication Sig Start Date End Date Taking? Authorizing Provider  atorvastatin (LIPITOR) 20 MG tablet Take 20 mg by mouth daily.    [provider]  Cyanocobalamin (VITAMIN B-12 PO) Take 1 tablet by mouth daily.    [provider]  diltiazem (CARDIZEM CD) 240 MG 24 hr capsule Take 240 mg by mouth daily. 03/28/21   [provider]  fenofibrate 160 MG tablet Take 160 mg by mouth daily.    [provider]  gabapentin (NEURONTIN) 300 MG capsule Take 300 mg by mouth every 8 (eight) hours. 04/02/21   [provider]  hydrocortisone (ANUSOL-HC) 25 MG suppository Use 1 suppository at bedtime for 14 days, then repeat as needed. 05/12/19   Gatha Mayer, MD  Lactobacillus-Inulin (PROBIOTIC DIGESTIVE SUPPORT PO) Take 1 tablet by mouth daily.    [provider]  lisinopril (PRINIVIL,ZESTRIL) 20 MG tablet Take 20 mg by mouth daily. 07/02/14   [provider]  metFORMIN (GLUCOPHAGE) 1000 MG tablet Take 1,000 mg by mouth 2 (two) times daily.  09/09/19   [provider]  metoprolol succinate (TOPROL-XL) 100 MG 24 hr tablet Take 100 mg by mouth daily. 09/21/14   [provider]  Omega-3 Fatty Acids (FISH OIL) 1000 MG CAPS Take 1,200 mg by mouth 2 (two) times daily.    [provider]  oxyCODONE-acetaminophen (PERCOCET) 5-325 MG tablet Take 1 tablet by mouth every 6 (six) hours as needed for severe pain or moderate pain (post-operatively). 05/25/21 05/25/22  Alexis Frock, MD  senna-docusate (SENOKOT-S) 8.6-50 MG tablet Take 1 tablet by mouth 2 (two) times daily. While taking strong pain meds to prevent constipation 05/25/21   Alexis Frock, MD  Wheat Dextrin (BENEFIBER DRINK MIX) PACK Take 1 Package by mouth daily.    [provider]    Physical Exam: Vitals:   06/01/21 2030 06/01/21 2045 06/01/21 2100 06/01/21 2120  BP: (!) 156/108 (!) 148/79 (!) 154/100 (!) 156/91  Pulse: 96 94 (!) 105 (!)  103  Resp: 15 10 (!) 26 (!) 24  Temp:      TempSrc:      SpO2: 100% 99% 98% 98%  Weight:      Height:       Constitutional: Resting in bed, NAD, calm, comfortable Eyes: PERRL, EOMI, lids and conjunctivae normal ENMT: Mucous membranes are moist. Posterior pharynx clear of any exudate or lesions.Normal dentition.  Lower facial droop present. Neck: normal, supple, no masses. Respiratory: clear to auscultation bilaterally, no wheezing, no crackles. Normal respiratory effort. No accessory muscle use.  Cardiovascular: Irregularly irregular, no murmurs / rubs / gallops. No extremity edema. 2+ pedal pulses. Abdomen: no tenderness, no masses palpated. No hepatosplenomegaly. Bowel sounds positive.  Musculoskeletal: no clubbing / cyanosis. No joint deformity upper and lower extremities. Good ROM, no contractures. Normal muscle tone.  Skin: no rashes, lesions, ulcers. No induration Neurologic: Mild dysarthria and left facial droop present otherwise CN 2-12 grossly intact. Sensation intact. Strength 5/5 in all 4.  Psychiatric: Normal judgment and insight. Alert and oriented x 3. Normal mood.   Labs on Admission: I have personally reviewed following labs and imaging studies  CBC: Recent Labs  Lab 06/01/21 1609  WBC 8.0  NEUTROABS 4.3  HGB 13.9  HCT 40.9  MCV 99.3  PLT 073   Basic Metabolic Panel: Recent Labs  Lab 06/01/21 1609  NA 130*  K 5.1  CL 91*  CO2 28  GLUCOSE 319*  BUN 25*  CREATININE 1.74*  CALCIUM 10.6*   GFR: Estimated Creatinine Clearance: 42.9 mL/min (A) (by C-G formula based on SCr of 1.74 mg/dL (H)). Liver Function Tests: Recent Labs  Lab 06/01/21 1609  AST 32  ALT 16  ALKPHOS 43  BILITOT 0.8  PROT 7.3  ALBUMIN 4.2   No results for input(s): LIPASE, AMYLASE in the last 168 hours. No results for input(s): AMMONIA in the last 168 hours. Coagulation Profile: Recent Labs  Lab 06/01/21 1609  INR 1.0   Cardiac Enzymes: No results for input(s): CKTOTAL,  CKMB, CKMBINDEX, TROPONINI in the last 168 hours. BNP (last 3 results) No results for input(s): PROBNP in the last 8760 hours. HbA1C: No results for input(s): HGBA1C in the last 72 hours. CBG: No results for input(s): GLUCAP in the last 168 hours. Lipid Profile: No results for input(s): CHOL, HDL, LDLCALC, TRIG, CHOLHDL, LDLDIRECT in the last 72 hours. Thyroid Function Tests: No results for input(s): TSH, T4TOTAL, FREET4, T3FREE, THYROIDAB in the last 72 hours. Anemia Panel: No results for input(s): VITAMINB12, FOLATE, FERRITIN, TIBC, IRON,  RETICCTPCT in the last 72 hours. Urine analysis:    Component Value Date/Time   COLORURINE YELLOW 06/01/2021 2143   APPEARANCEUR CLEAR 06/01/2021 2143   LABSPEC 1.023 06/01/2021 2143   PHURINE 8.0 06/01/2021 2143   GLUCOSEU 50 (A) 06/01/2021 2143   HGBUR MODERATE (A) 06/01/2021 2143   BILIRUBINUR NEGATIVE 06/01/2021 2143   [place] NEGATIVE 06/01/2021 2143   PROTEINUR 100 (A) 06/01/2021 2143   UROBILINOGEN 0.2 12/19/2014 1132   NITRITE NEGATIVE 06/01/2021 2143   LEUKOCYTESUR NEGATIVE 06/01/2021 2143    Radiological Exams on Admission: CT Angio Head W or Wo Contrast  Result Date: 06/01/2021 CLINICAL DATA:  Stroke follow-up EXAM: CT ANGIOGRAPHY HEAD AND NECK TECHNIQUE: Multidetector CT imaging of the head and neck was performed using the standard protocol during bolus administration of intravenous contrast. Multiplanar CT image reconstructions and MIPs were obtained to evaluate the vascular anatomy. Carotid stenosis measurements (when applicable) are obtained utilizing NASCET criteria, using the distal internal carotid diameter as the denominator. CONTRAST:  64mL OMNIPAQUE IOHEXOL 350 MG/ML SOLN COMPARISON:  Brain MRI 06/01/2021 FINDINGS: CT HEAD FINDINGS Brain: Petechial hemorrhage in the right frontal lobe at the area abnormality described on the earlier MRI. No mass effect. There is generalized atrophy without lobar predilection. There is  hypoattenuation of the periventricular white matter, most commonly indicating chronic ischemic microangiopathy. Skull: The visualized skull base, calvarium and extracranial soft tissues are normal. Sinuses/Orbits: No fluid levels or advanced mucosal thickening of the visualized paranasal sinuses. No mastoid or middle ear effusion. The orbits are normal. CTA NECK FINDINGS SKELETON: There is no bony spinal canal stenosis. No lytic or blastic lesion. OTHER NECK: Large amount of subcutaneous gas in the visualized upper chest and the neck. UPPER CHEST: Large apical bullae.  Emphysema. AORTIC ARCH: There is calcific atherosclerosis of the aortic arch. There is no aneurysm, dissection or hemodynamically significant stenosis of the visualized portion of the aorta. Conventional 3 vessel aortic branching pattern. The visualized proximal subclavian arteries are widely patent. RIGHT CAROTID SYSTEM: No dissection, occlusion or aneurysm. Mild atherosclerotic calcification at the carotid bifurcation without hemodynamically significant stenosis. LEFT CAROTID SYSTEM: No dissection, occlusion or aneurysm. Mild atherosclerotic calcification at the carotid bifurcation without hemodynamically significant stenosis. VERTEBRAL ARTERIES: Codominant configuration. Calcification at the origin of the right vertebral artery with moderate narrowing. Right vertebral artery is otherwise normal. The left vertebral artery is normal along its entire course. CTA HEAD FINDINGS POSTERIOR CIRCULATION: --Vertebral arteries: Normal V4 segments. --Inferior cerebellar arteries: Normal. --Basilar artery: Normal. --Superior cerebellar arteries: Normal. --Posterior cerebral arteries (PCA): Left PCA is occluded at the P1 segment. Right PCA is normal. ANTERIOR CIRCULATION: --Intracranial internal carotid arteries: Normal. --Anterior cerebral arteries (ACA): Normal. Both A1 segments are present. Patent anterior communicating artery (a-comm). --Middle cerebral  arteries (MCA): Normal. VENOUS SINUSES: As permitted by contrast timing, patent. ANATOMIC VARIANTS: None Review of the MIP images confirms the above findings. IMPRESSION: 1. 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 2. Petechial hemorrhage in the right frontal lobe at the area abnormality described on the earlier MRI. No mass effect. 3. Large amount of subcutaneous gas in the visualized upper chest and neck. This may be secondary to a ruptured apical bulla. Aortic Atherosclerosis (ICD10-I70.0) and Emphysema (ICD10-J43.9). Electronically Signed   By: Ulyses Jarred M.D.   On: 06/01/2021 19:25   CT Angio Neck W and/or Wo Contrast  Result  Date: 06/01/2021 CLINICAL DATA:  Stroke follow-up EXAM: CT ANGIOGRAPHY HEAD AND NECK TECHNIQUE: Multidetector CT imaging of the head and neck was performed using the standard protocol during bolus administration of intravenous contrast. Multiplanar CT image reconstructions and MIPs were obtained to evaluate the vascular anatomy. Carotid stenosis measurements (when applicable) are obtained utilizing NASCET criteria, using the distal internal carotid diameter as the denominator. CONTRAST:  34mL OMNIPAQUE IOHEXOL 350 MG/ML SOLN COMPARISON:  Brain MRI 06/01/2021 FINDINGS: CT HEAD FINDINGS Brain: Petechial hemorrhage in the right frontal lobe at the area abnormality described on the earlier MRI. No mass effect. There is generalized atrophy without lobar predilection. There is hypoattenuation of the periventricular white matter, most commonly indicating chronic ischemic microangiopathy. Skull: The visualized skull base, calvarium and extracranial soft tissues are normal. Sinuses/Orbits: No fluid levels or advanced mucosal thickening of the visualized paranasal sinuses. No mastoid or middle ear effusion. The orbits are normal. CTA NECK FINDINGS SKELETON: There is no bony spinal canal  stenosis. No lytic or blastic lesion. OTHER NECK: Large amount of subcutaneous gas in the visualized upper chest and the neck. UPPER CHEST: Large apical bullae.  Emphysema. AORTIC ARCH: There is calcific atherosclerosis of the aortic arch. There is no aneurysm, dissection or hemodynamically significant stenosis of the visualized portion of the aorta. Conventional 3 vessel aortic branching pattern. The visualized proximal subclavian arteries are widely patent. RIGHT CAROTID SYSTEM: No dissection, occlusion or aneurysm. Mild atherosclerotic calcification at the carotid bifurcation without hemodynamically significant stenosis. LEFT CAROTID SYSTEM: No dissection, occlusion or aneurysm. Mild atherosclerotic calcification at the carotid bifurcation without hemodynamically significant stenosis. VERTEBRAL ARTERIES: Codominant configuration. Calcification at the origin of the right vertebral artery with moderate narrowing. Right vertebral artery is otherwise normal. The left vertebral artery is normal along its entire course. CTA HEAD FINDINGS POSTERIOR CIRCULATION: --Vertebral arteries: Normal V4 segments. --Inferior cerebellar arteries: Normal. --Basilar artery: Normal. --Superior cerebellar arteries: Normal. --Posterior cerebral arteries (PCA): Left PCA is occluded at the P1 segment. Right PCA is normal. ANTERIOR CIRCULATION: --Intracranial internal carotid arteries: Normal. --Anterior cerebral arteries (ACA): Normal. Both A1 segments are present. Patent anterior communicating artery (a-comm). --Middle cerebral arteries (MCA): Normal. VENOUS SINUSES: As permitted by contrast timing, patent. ANATOMIC VARIANTS: None Review of the MIP images confirms the above findings. IMPRESSION: 1. 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 2. Petechial hemorrhage in the right frontal lobe at the area abnormality described on the  earlier MRI. No mass effect. 3. Large amount of subcutaneous gas in the visualized upper chest and neck. This may be secondary to a ruptured apical bulla. Aortic Atherosclerosis (ICD10-I70.0) and Emphysema (ICD10-J43.9). Electronically Signed   By: Ulyses Jarred M.D.   On: 06/01/2021 19:25   MR BRAIN W WO CONTRAST  Result Date: 06/01/2021 CLINICAL DATA:  Left facial droop, confusion, history of renal mass EXAM: MRI HEAD WITHOUT AND WITH CONTRAST TECHNIQUE: Multiplanar, multiecho pulse sequences of the brain and surrounding structures were obtained without and with intravenous contrast. CONTRAST:  7.54mL GADAVIST GADOBUTROL 1 MMOL/ML IV SOLN COMPARISON:  None. FINDINGS: Brain: There is heterogeneous cortical/subcortical diffusion signal in the right frontal lobe with involvement of the lateral precentral gyrus. There is corresponding susceptibility. Curvilinear enhancement is present. There is a possible developmental venous anomaly in this region. No nodular enhancement. Prominence of the ventricles and sulci reflects parenchymal volume loss. Additional patchy T2 hyperintensity in the  supratentorial white matter is nonspecific but may reflect mild chronic microvascular ischemic changes. There is no additional abnormal enhancement. Vascular: Major vessel flow voids at the skull base are preserved. Skull and upper cervical spine: Normal marrow signal is preserved. Sinuses/Orbits: Paranasal sinuses are aerated. Bilateral lens replacements. Other: Sella is unremarkable. Minimal patchy right mastoid fluid opacification. IMPRESSION: Acute hemorrhage in the right frontal lobe involving the lateral precentral gyrus. May reflect hemorrhagic conversion of a subacute ischemic infarct given timing of symptoms. No nodular enhancement to suggest underlying metastatic lesion. There is a possible developmental venous anomaly in this region and therefore hemorrhage secondary to a cavernous malformation is also a differential  consideration. These results were called by telephone at the time of interpretation on 06/01/2021 at 1:18 pm to provider [facility] , who verbally acknowledged these results. Electronically Signed   By: Macy Mis M.D.   On: 06/01/2021 13:19   US RENAL  Result Date: 06/01/2021 CLINICAL DATA:  Acute kidney injury EXAM: RENAL / URINARY TRACT ULTRASOUND COMPLETE COMPARISON:  CT 03/21/2021.  Ultrasound 07/11/2020 FINDINGS: Right Kidney: Renal measurements: 11.9 x 6.2 x 5.8 cm = volume: 221 mL. Normal echotexture. 1.9 cm cyst in the lower pole. 3 cm complex hypoechoic area within the midpole compared with 3 cm on prior CT. Left Kidney: Renal measurements: 12.8 x 6.5 x 6.0 cm = volume: 259 mL. 16 mm calcification in the upper pole compatible with nonobstructing stone. Multiple cysts measuring up to 2.8 cm. Normal echotexture. No hydronephrosis. Bladder: Appears normal for degree of bladder distention. Other: Incidentally noted gallstone within the gallbladder. IMPRESSION: Bilateral renal cysts. Complex hypoechoic area within the midpole of the right kidney measures 3 cm and is stable in size when compared to prior CT. This could be further characterized with elective renal protocol MRI if felt clinically indicated. No hydronephrosis. Cholelithiasis. Electronically Signed   By: Rolm Baptise M.D.   On: 06/01/2021 23:22    EKG: Ordered and pending.  Assessment/Plan Principal Problem:   Ischemic stroke of frontal lobe (HCC) Active Problems:   DM2 (diabetes mellitus, type 2) ([place])   Essential hypertension   Dyslipidemia associated with type 2 diabetes mellitus ([place])   Permanent atrial fibrillation (HCC) -CHA2DS2-VASc score 4   Acute kidney injury superimposed on CKD (HCC)   Angel Shelton is a 66 y.o. male with medical history significant for persistent atrial fibrillation on Eliquis (last dose 05/20/2021), T2DM, HTN, HLD, CKD stage IIIa, right renal neoplasm s/p robotic assisted partial  nephrectomy 05/23/2021 who is admitted with ischemic stroke of the right frontal lobe with hemorrhagic conversion.  Ischemic stroke of the right frontal lobe with hemorrhagic conversion: Presenting with left facial droop, dysarthria with initial expressive aphasia night of 12/28.  Aphasia has resolved.  MRI brain 1/6 shows ischemic stroke of the right frontal lobe with acute hemorrhagic conversion. -Neurology consulted, recommended admission to [facility] for further work-up -CTA head/neck shows chronic appearing occlusion of left posterior cerebral artery at the P1 segment and petechial hemorrhage right frontal lobe -Hold Eliquis and aspirin -Obtain echocardiogram -Check A1c, lipid panel -BP goal normotensive per neurology -Keep on telemetry, continue neurochecks -PT/OT/SLP eval  Acute kidney injury superimposed on CKD stage IIIa: Creatinine 1.74 on admission compared to baseline 1.0-1.1.  S/p recent right renal nephrectomy. -Start on IV fluid hydration overnight -Hold home lisinopril-HCTZ -Obtain renal ultrasound  Persistent atrial fibrillation: Remains in atrial fibrillation with controlled rate.  Continue Toprol-XL and diltiazem.  Hold Eliquis as above.  Hyponatremia: Sodium corrects to 135 in setting of hyperglycemia.  Hypercalcemia: Suspect related to volume depletion.  Continue IV fluid hydration as above and repeat labs in AM.  Type 2 diabetes with hyperglycemia: Hyperglycemic without evidence of DKA/HHS on admission.  Hypertension: Continue home diltiazem to 40 mg, Toprol-XL 100 mg daily.  Holding lisinopril and HCTZ with AKI.  Hyperlipidemia: Continue atorvastatin.  Right renal neoplasm: S/p robotic assisted partial nephrectomy 05/23/2021 by Dr. Tresa Moore.  DVT prophylaxis: SCDs Code Status: Full code, confirmed with patient on admission. Family Communication: Discussed with patient, he has discussed with family. Disposition Plan: From home, dispo pending further  stroke work-up. Consults called: Neurology Level of care: Telemetry Cardiac Admission status:   Status is: Observation  The patient remains OBS appropriate and will d/c before 2 midnights.  Zada Finders MD [facility]  If 7PM-7AM, please contact night-coverage [email or web address]  06/01/2021, 11:53 PM

## 2021-06-01 NOTE — ED Notes (Signed)
Family updated on patient status.

## 2021-06-01 NOTE — ED Notes (Signed)
Admitting MD at bedside.

## 2021-06-02 ENCOUNTER — Observation Stay (HOSPITAL_COMMUNITY): Payer: Medicare Other

## 2021-06-02 ENCOUNTER — Encounter (HOSPITAL_COMMUNITY): Payer: Self-pay | Admitting: Internal Medicine

## 2021-06-02 DIAGNOSIS — I611 Nontraumatic intracerebral hemorrhage in hemisphere, cortical: Secondary | ICD-10-CM | POA: Diagnosis present

## 2021-06-02 DIAGNOSIS — N179 Acute kidney failure, unspecified: Secondary | ICD-10-CM | POA: Diagnosis present

## 2021-06-02 DIAGNOSIS — E1165 Type 2 diabetes mellitus with hyperglycemia: Secondary | ICD-10-CM | POA: Diagnosis present

## 2021-06-02 DIAGNOSIS — Z79899 Other long term (current) drug therapy: Secondary | ICD-10-CM | POA: Diagnosis not present

## 2021-06-02 DIAGNOSIS — E785 Hyperlipidemia, unspecified: Secondary | ICD-10-CM | POA: Diagnosis present

## 2021-06-02 DIAGNOSIS — Z20822 Contact with and (suspected) exposure to covid-19: Secondary | ICD-10-CM | POA: Diagnosis present

## 2021-06-02 DIAGNOSIS — R29702 NIHSS score 2: Secondary | ICD-10-CM | POA: Diagnosis present

## 2021-06-02 DIAGNOSIS — R471 Dysarthria and anarthria: Secondary | ICD-10-CM | POA: Diagnosis present

## 2021-06-02 DIAGNOSIS — N189 Chronic kidney disease, unspecified: Secondary | ICD-10-CM

## 2021-06-02 DIAGNOSIS — I6389 Other cerebral infarction: Secondary | ICD-10-CM | POA: Diagnosis not present

## 2021-06-02 DIAGNOSIS — R233 Spontaneous ecchymoses: Secondary | ICD-10-CM | POA: Diagnosis not present

## 2021-06-02 DIAGNOSIS — R4701 Aphasia: Secondary | ICD-10-CM | POA: Diagnosis present

## 2021-06-02 DIAGNOSIS — E1169 Type 2 diabetes mellitus with other specified complication: Secondary | ICD-10-CM

## 2021-06-02 DIAGNOSIS — E1122 Type 2 diabetes mellitus with diabetic chronic kidney disease: Secondary | ICD-10-CM | POA: Diagnosis present

## 2021-06-02 DIAGNOSIS — T797XXA Traumatic subcutaneous emphysema, initial encounter: Secondary | ICD-10-CM | POA: Diagnosis present

## 2021-06-02 DIAGNOSIS — Z7982 Long term (current) use of aspirin: Secondary | ICD-10-CM | POA: Diagnosis not present

## 2021-06-02 DIAGNOSIS — R29810 Facial weakness: Secondary | ICD-10-CM | POA: Diagnosis present

## 2021-06-02 DIAGNOSIS — I4821 Permanent atrial fibrillation: Secondary | ICD-10-CM

## 2021-06-02 DIAGNOSIS — I639 Cerebral infarction, unspecified: Secondary | ICD-10-CM | POA: Diagnosis present

## 2021-06-02 DIAGNOSIS — X58XXXA Exposure to other specified factors, initial encounter: Secondary | ICD-10-CM | POA: Diagnosis present

## 2021-06-02 DIAGNOSIS — D49511 Neoplasm of unspecified behavior of right kidney: Secondary | ICD-10-CM | POA: Diagnosis present

## 2021-06-02 DIAGNOSIS — I10 Essential (primary) hypertension: Secondary | ICD-10-CM

## 2021-06-02 DIAGNOSIS — E119 Type 2 diabetes mellitus without complications: Secondary | ICD-10-CM | POA: Diagnosis not present

## 2021-06-02 DIAGNOSIS — Z823 Family history of stroke: Secondary | ICD-10-CM | POA: Diagnosis not present

## 2021-06-02 DIAGNOSIS — I2510 Atherosclerotic heart disease of native coronary artery without angina pectoris: Secondary | ICD-10-CM | POA: Diagnosis present

## 2021-06-02 DIAGNOSIS — Z7901 Long term (current) use of anticoagulants: Secondary | ICD-10-CM | POA: Diagnosis not present

## 2021-06-02 DIAGNOSIS — I129 Hypertensive chronic kidney disease with stage 1 through stage 4 chronic kidney disease, or unspecified chronic kidney disease: Secondary | ICD-10-CM | POA: Diagnosis present

## 2021-06-02 DIAGNOSIS — K219 Gastro-esophageal reflux disease without esophagitis: Secondary | ICD-10-CM | POA: Diagnosis present

## 2021-06-02 DIAGNOSIS — N1831 Chronic kidney disease, stage 3a: Secondary | ICD-10-CM | POA: Diagnosis present

## 2021-06-02 DIAGNOSIS — E871 Hypo-osmolality and hyponatremia: Secondary | ICD-10-CM | POA: Diagnosis present

## 2021-06-02 LAB — CBC
HCT: 38.5 % — ABNORMAL LOW (ref 39.0–52.0)
Hemoglobin: 12.9 g/dL — ABNORMAL LOW (ref 13.0–17.0)
MCH: 32.9 pg (ref 26.0–34.0)
MCHC: 33.5 g/dL (ref 30.0–36.0)
MCV: 98.2 fL (ref 80.0–100.0)
Platelets: 265 10*3/uL (ref 150–400)
RBC: 3.92 MIL/uL — ABNORMAL LOW (ref 4.22–5.81)
RDW: 12.6 % (ref 11.5–15.5)
WBC: 7.2 10*3/uL (ref 4.0–10.5)
nRBC: 0 % (ref 0.0–0.2)

## 2021-06-02 LAB — ECHOCARDIOGRAM COMPLETE
AR max vel: 3.04 cm2
AV Area VTI: 2.72 cm2
AV Area mean vel: 2.9 cm2
AV Mean grad: 2 mmHg
AV Peak grad: 3.6 mmHg
Ao pk vel: 0.94 m/s
Height: 71 in
Weight: 2560 oz

## 2021-06-02 LAB — CBG MONITORING, ED
Glucose-Capillary: 172 mg/dL — ABNORMAL HIGH (ref 70–99)
Glucose-Capillary: 174 mg/dL — ABNORMAL HIGH (ref 70–99)
Glucose-Capillary: 194 mg/dL — ABNORMAL HIGH (ref 70–99)
Glucose-Capillary: 197 mg/dL — ABNORMAL HIGH (ref 70–99)

## 2021-06-02 LAB — BASIC METABOLIC PANEL
Anion gap: 11 (ref 5–15)
BUN: 20 mg/dL (ref 8–23)
CO2: 28 mmol/L (ref 22–32)
Calcium: 10 mg/dL (ref 8.9–10.3)
Chloride: 92 mmol/L — ABNORMAL LOW (ref 98–111)
Creatinine, Ser: 1.09 mg/dL (ref 0.61–1.24)
GFR, Estimated: >60 mL/min (ref >60)
Glucose, Bld: 172 mg/dL — ABNORMAL HIGH (ref 70–99)
Potassium: 4.4 mmol/L (ref 3.5–5.1)
Sodium: 131 mmol/L — ABNORMAL LOW (ref 135–145)

## 2021-06-02 LAB — HIV ANTIBODY (ROUTINE TESTING W REFLEX): HIV Screen 4th Generation wRfx: NONREACTIVE

## 2021-06-02 MED ORDER — ASPIRIN 81 MG PO CHEW
81.0000 mg | CHEWABLE_TABLET | Freq: Every day | ORAL | Status: DC
  Administered 2021-06-02 – 2021-06-03 (×2): 81 mg via ORAL
  Filled 2021-06-02 (×2): qty 1

## 2021-06-02 NOTE — ED Notes (Signed)
Patient is up to the bathroom.  

## 2021-06-02 NOTE — Progress Notes (Signed)
PROGRESS NOTE    Angel Shelton  XHB:[id number] DOB: [birth year] DOA: 06/01/2021 PCP: Ginger Organ., MD    Brief Narrative:  66 year old male with a history of persistent atrial fibrillation, hypertension, diabetes, recent partial nephrectomy for right-sided neoplasm, admitted to the hospital with a several day history of right facial droop and dysarthria.  Found to have subacute infarct with possible hemorrhagic conversion.  Admitted for further work-up.   Assessment & Plan:   Principal Problem:   Ischemic stroke of frontal lobe (HCC) Active Problems:   DM2 (diabetes mellitus, type 2) ([place])   Essential hypertension   Dyslipidemia associated with type 2 diabetes mellitus (HCC)   Permanent atrial fibrillation (HCC) -CHA2DS2-VASc score 4   Acute kidney injury superimposed on CKD ([place])   Stroke ([place])   Ischemic stroke of right frontal lobe with likely hemorrhagic conversion -Presented with left facial droop, dysarthria -Symptoms started on 12/28 -CTA of the head and neck shows chronic appearing occlusion of the left posterior scrotal artery at the P1 segment and petechial hemorrhage right frontal lobe -Patient was taken Eliquis for atrial fibrillation, but this was held since 12/25 for recent surgery -Continue to hold Eliquis and aspirin until further recommendations from neurology -Echocardiogram unremarkable -Seen by PT/OT with no further follow-up recommended -Speech therapy consulted -Lipid panel/A1c pending -Initial plans were to transfer the patient to [name] for further neurology evaluation.  Case reviewed with Dr. Quinn Axe who had seen the patient and was involved who did not feel that he would need to be transferred and could be managed at [facility].  AKI on CKD stage IIIa -Holding lisinopril/hydrochlorothiazide -Creatinine 1.7 on admission -This has since normalized to 1.0  Hyperlipidemia -Continue statin  Diabetes, type II with hyperglycemia -A1c has  been ordered -Continue on sliding scale insulin  Right renal neoplasm -Status post partial nephrectomy on 12/28 -Continue follow-up with urology  Persistent atrial fibrillation -He is continued on his home dose of Toprol and diltiazem -Anticoagulation on hold until further recommendations from neurology available  Subcutaneous emphysema -Incidental finding of subcutaneous emphysema at base of neck likely chest wall -Noted to have some emphysematous changes on chest x-ray -May be related to recent surgery -Does not have any shortness of breath or chest pain -Reviewed with Dr. Melvyn Novas, pulmonology who did not feel that any further work-up at this time was indicated since he was asymptomatic -Since the patient does have emphysematous changes on chest x-ray, he will need repeat chest x-ray in the next 2 to 3 weeks to ensure resolution.  This can be arranged by his primary care physician  DVT prophylaxis: SCD's Start: 06/01/21 2220  Code Status: Full code Family Communication: Discussed with patient Disposition Plan: Status is: Inpatient  Remains inpatient appropriate because: Complete stroke work-up         Consultants:  Neurology  Procedures:  Echocardiogram: Normal EF, no intracardiac source of emboli  Antimicrobials:      Subjective: Continues to have some left facial droop, but denies any weakness or numbness in his extremities.  Otherwise feels well.  Denies any shortness of breath or chest pain.  Objective: Vitals:   06/02/21 0900 06/02/21 0906 06/02/21 1215 06/02/21 1704  BP: (!) 153/90 (!) 163/91 131/82 130/69  Pulse: 80 84 74 (!) 56  Resp: (!) 25 18 12 13   Temp:  98.1 F (36.7 C) 98.1 F (36.7 C) 98.1 F (36.7 C)  TempSrc:  Oral Oral Oral  SpO2: 100%  100% 100%  Weight:  Height:        Intake/Output Summary (Last 24 hours) at 06/02/2021 1914 Last data filed at 06/02/2021 1609 Gross per 24 hour  Intake 1000 ml  Output --  Net 1000 ml   Filed  Weights   06/01/21 1520  Weight: 72.6 kg    Examination:  General exam: Appears calm and comfortable  Respiratory system: Clear to auscultation. Respiratory effort normal. Cardiovascular system: S1 & S2 heard, RRR. No JVD, murmurs, rubs, gallops or clicks. No pedal edema. Gastrointestinal system: Abdomen is nondistended, soft and nontender. No organomegaly or masses felt. Normal bowel sounds heard. Central nervous system: Alert and oriented.  Persistent left facial droop, strength is equal in extremities bilaterally.. Extremities: Symmetric 5 x 5 power. Skin: No rashes, lesions or ulcers Psychiatry: Judgement and insight appear normal. Mood & affect appropriate.     Data Reviewed: I have personally reviewed following labs and imaging studies  CBC: Recent Labs  Lab 06/01/21 1609 06/02/21 0500  WBC 8.0 7.2  NEUTROABS 4.3  --   HGB 13.9 12.9*  HCT 40.9 38.5*  MCV 99.3 98.2  PLT 259 240   Basic Metabolic Panel: Recent Labs  Lab 06/01/21 1609 06/02/21 0500  NA 130* 131*  K 5.1 4.4  CL 91* 92*  CO2 28 28  GLUCOSE 319* 172*  BUN 25* 20  CREATININE 1.74* 1.09  CALCIUM 10.6* 10.0   GFR: Estimated Creatinine Clearance: 68.5 mL/min (by C-G formula based on SCr of 1.09 mg/dL). Liver Function Tests: Recent Labs  Lab 06/01/21 1609  AST 32  ALT 16  ALKPHOS 43  BILITOT 0.8  PROT 7.3  ALBUMIN 4.2   No results for input(s): LIPASE, AMYLASE in the last 168 hours. No results for input(s): AMMONIA in the last 168 hours. Coagulation Profile: Recent Labs  Lab 06/01/21 1609  INR 1.0   Cardiac Enzymes: No results for input(s): CKTOTAL, CKMB, CKMBINDEX, TROPONINI in the last 168 hours. BNP (last 3 results) No results for input(s): PROBNP in the last 8760 hours. HbA1C: No results for input(s): HGBA1C in the last 72 hours. CBG: Recent Labs  Lab 06/02/21 0004 06/02/21 0806 06/02/21 1200 06/02/21 1712  GLUCAP 197* 174* 172* 194*   Lipid Profile: No results for  input(s): CHOL, HDL, LDLCALC, TRIG, CHOLHDL, LDLDIRECT in the last 72 hours. Thyroid Function Tests: No results for input(s): TSH, T4TOTAL, FREET4, T3FREE, THYROIDAB in the last 72 hours. Anemia Panel: No results for input(s): VITAMINB12, FOLATE, FERRITIN, TIBC, IRON, RETICCTPCT in the last 72 hours. Sepsis Labs: No results for input(s): PROCALCITON, LATICACIDVEN in the last 168 hours.  Recent Results (from the past 240 hour(s))  Resp Panel by RT-PCR (Flu A&B, Covid) Nasopharyngeal Swab     Status: None   Collection Time: 06/01/21  4:10 PM   Specimen: Nasopharyngeal Swab; Nasopharyngeal(NP) swabs in vial transport medium  Result Value Ref Range Status   SARS Coronavirus 2 by RT PCR NEGATIVE NEGATIVE Final    Comment: (NOTE) SARS-CoV-2 target nucleic acids are NOT DETECTED.  The SARS-CoV-2 RNA is generally detectable in upper respiratory specimens during the acute phase of infection. The lowest concentration of SARS-CoV-2 viral copies this assay can detect is 138 copies/mL. A negative result does not preclude SARS-Cov-2 infection and should not be used as the sole basis for treatment or other patient management decisions. A negative result may occur with  improper specimen collection/handling, submission of specimen other than nasopharyngeal swab, presence of viral mutation(s) within the areas targeted by this assay,  and inadequate number of viral copies(<138 copies/mL). A negative result must be combined with clinical observations, patient history, and epidemiological information. The expected result is Negative.  Fact Sheet for Patients:  EntrepreneurPulse.com.au  Fact Sheet for Healthcare Providers:  IncredibleEmployment.be  This test is no t yet approved or cleared by the [place] FDA and  has been authorized for detection and/or diagnosis of SARS-CoV-2 by FDA under an Emergency Use Authorization (EUA). This EUA will remain  in effect  (meaning this test can be used) for the duration of the COVID-19 declaration under Section 564(b)(1) of the Act, 21 U.S.C.section 360bbb-3(b)(1), unless the authorization is terminated  or revoked sooner.       Influenza A by PCR NEGATIVE NEGATIVE Final   Influenza B by PCR NEGATIVE NEGATIVE Final    Comment: (NOTE) The Xpert Xpress SARS-CoV-2/FLU/RSV plus assay is intended as an aid in the diagnosis of influenza from Nasopharyngeal swab specimens and should not be used as a sole basis for treatment. Nasal washings and aspirates are unacceptable for Xpert Xpress SARS-CoV-2/FLU/RSV testing.  Fact Sheet for Patients: EntrepreneurPulse.com.au  Fact Sheet for Healthcare Providers: IncredibleEmployment.be  This test is not yet approved or cleared by the [place] FDA and has been authorized for detection and/or diagnosis of SARS-CoV-2 by FDA under an Emergency Use Authorization (EUA). This EUA will remain in effect (meaning this test can be used) for the duration of the COVID-19 declaration under Section 564(b)(1) of the Act, 21 U.S.C. section 360bbb-3(b)(1), unless the authorization is terminated or revoked.  Performed at [facility], [place] [address], [place], [place] [address]          Radiology Studies: CT Angio Head W or Wo Contrast  Result Date: 06/01/2021 CLINICAL DATA:  Stroke follow-up EXAM: CT ANGIOGRAPHY HEAD AND NECK TECHNIQUE: Multidetector CT imaging of the head and neck was performed using the standard protocol during bolus administration of intravenous contrast. Multiplanar CT image reconstructions and MIPs were obtained to evaluate the vascular anatomy. Carotid stenosis measurements (when applicable) are obtained utilizing NASCET criteria, using the distal internal carotid diameter as the denominator. CONTRAST:  60mL OMNIPAQUE IOHEXOL 350 MG/ML SOLN COMPARISON:  Brain MRI 06/01/2021 FINDINGS: CT HEAD  FINDINGS Brain: Petechial hemorrhage in the right frontal lobe at the area abnormality described on the earlier MRI. No mass effect. There is generalized atrophy without lobar predilection. There is hypoattenuation of the periventricular white matter, most commonly indicating chronic ischemic microangiopathy. Skull: The visualized skull base, calvarium and extracranial soft tissues are normal. Sinuses/Orbits: No fluid levels or advanced mucosal thickening of the visualized paranasal sinuses. No mastoid or middle ear effusion. The orbits are normal. CTA NECK FINDINGS SKELETON: There is no bony spinal canal stenosis. No lytic or blastic lesion. OTHER NECK: Large amount of subcutaneous gas in the visualized upper chest and the neck. UPPER CHEST: Large apical bullae.  Emphysema. AORTIC ARCH: There is calcific atherosclerosis of the aortic arch. There is no aneurysm, dissection or hemodynamically significant stenosis of the visualized portion of the aorta. Conventional 3 vessel aortic branching pattern. The visualized proximal subclavian arteries are widely patent. RIGHT CAROTID SYSTEM: No dissection, occlusion or aneurysm. Mild atherosclerotic calcification at the carotid bifurcation without hemodynamically significant stenosis. LEFT CAROTID SYSTEM: No dissection, occlusion or aneurysm. Mild atherosclerotic calcification at the carotid bifurcation without hemodynamically significant stenosis. VERTEBRAL ARTERIES: Codominant configuration. Calcification at the origin of the right vertebral artery with moderate narrowing. Right vertebral artery is otherwise normal. The left vertebral artery  is normal along its entire course. CTA HEAD FINDINGS POSTERIOR CIRCULATION: --Vertebral arteries: Normal V4 segments. --Inferior cerebellar arteries: Normal. --Basilar artery: Normal. --Superior cerebellar arteries: Normal. --Posterior cerebral arteries (PCA): Left PCA is occluded at the P1 segment. Right PCA is normal. ANTERIOR  CIRCULATION: --Intracranial internal carotid arteries: Normal. --Anterior cerebral arteries (ACA): Normal. Both A1 segments are present. Patent anterior communicating artery (a-comm). --Middle cerebral arteries (MCA): Normal. VENOUS SINUSES: As permitted by contrast timing, patent. ANATOMIC VARIANTS: None Review of the MIP images confirms the above findings. IMPRESSION: 1. 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 2. Petechial hemorrhage in the right frontal lobe at the area abnormality described on the earlier MRI. No mass effect. 3. Large amount of subcutaneous gas in the visualized upper chest and neck. This may be secondary to a ruptured apical bulla. Aortic Atherosclerosis (ICD10-I70.0) and Emphysema (ICD10-J43.9). Electronically Signed   By: Ulyses Jarred M.D.   On: 06/01/2021 19:25   DG Neck Soft Tissue  Result Date: 06/02/2021 CLINICAL DATA:  Subcutaneous gas in the visualized upper chest and neck seen on a CT angiogram of the neck yesterday. EXAM: NECK SOFT TISSUES - 1+ VIEW COMPARISON:  CT angiogram of the neck June 01, 2021 FINDINGS: A bulla is seen in the lateral left apex. No pneumothorax identified. A bulla is also seen in the right apex. No other abnormalities in the upper chest. Subcutaneous air in the bilateral neck, right greater than left, persists. Overall, the amount of air is stable to mildly improved. No other abnormalities identified. IMPRESSION: Stable to mildly improved subcutaneous air in the base of the neck bilaterally. Electronically Signed   By: Dorise Bullion III M.D.   On: 06/02/2021 17:04   DG Chest 2 View  Result Date: 06/02/2021 CLINICAL DATA:  Subcutaneous emphysema in the upper chest/neck, as seen on prior CTA neck. EXAM: CHEST - 2 VIEW COMPARISON:  CTA neck dated June 01, 2021 FINDINGS: The heart size and mediastinal contours are within normal limits.  Hyperinflated lungs with emphysematous changes of the lung apices. No focal consolidation or pleural effusion. No appreciable pneumothorax. Subcutaneous emphysema with multiple small foci of gas at the base of the neck and possibly in the right chest wall. The visualized skeletal structures are unremarkable. IMPRESSION: 1.  No acute cardiopulmonary process. 2. Emphysematous changes. No evidence of pneumothorax. No focal consolidation. 3. Subcutaneous emphysema about base of the neck and likely in the right chest wall. Electronically Signed   By: Keane Police D.O.   On: 06/02/2021 17:06   CT Angio Neck W and/or Wo Contrast  Result Date: 06/01/2021 CLINICAL DATA:  Stroke follow-up EXAM: CT ANGIOGRAPHY HEAD AND NECK TECHNIQUE: Multidetector CT imaging of the head and neck was performed using the standard protocol during bolus administration of intravenous contrast. Multiplanar CT image reconstructions and MIPs were obtained to evaluate the vascular anatomy. Carotid stenosis measurements (when applicable) are obtained utilizing NASCET criteria, using the distal internal carotid diameter as the denominator. CONTRAST:  42mL OMNIPAQUE IOHEXOL 350 MG/ML SOLN COMPARISON:  Brain MRI 06/01/2021 FINDINGS: CT HEAD FINDINGS Brain: Petechial hemorrhage in the right frontal lobe at the area abnormality described on the earlier MRI. No mass effect. There is generalized atrophy without lobar predilection. There is hypoattenuation of the periventricular white matter, most commonly indicating chronic ischemic microangiopathy. Skull: The visualized skull base, calvarium and extracranial soft tissues are  normal. Sinuses/Orbits: No fluid levels or advanced mucosal thickening of the visualized paranasal sinuses. No mastoid or middle ear effusion. The orbits are normal. CTA NECK FINDINGS SKELETON: There is no bony spinal canal stenosis. No lytic or blastic lesion. OTHER NECK: Large amount of subcutaneous gas in the visualized upper chest  and the neck. UPPER CHEST: Large apical bullae.  Emphysema. AORTIC ARCH: There is calcific atherosclerosis of the aortic arch. There is no aneurysm, dissection or hemodynamically significant stenosis of the visualized portion of the aorta. Conventional 3 vessel aortic branching pattern. The visualized proximal subclavian arteries are widely patent. RIGHT CAROTID SYSTEM: No dissection, occlusion or aneurysm. Mild atherosclerotic calcification at the carotid bifurcation without hemodynamically significant stenosis. LEFT CAROTID SYSTEM: No dissection, occlusion or aneurysm. Mild atherosclerotic calcification at the carotid bifurcation without hemodynamically significant stenosis. VERTEBRAL ARTERIES: Codominant configuration. Calcification at the origin of the right vertebral artery with moderate narrowing. Right vertebral artery is otherwise normal. The left vertebral artery is normal along its entire course. CTA HEAD FINDINGS POSTERIOR CIRCULATION: --Vertebral arteries: Normal V4 segments. --Inferior cerebellar arteries: Normal. --Basilar artery: Normal. --Superior cerebellar arteries: Normal. --Posterior cerebral arteries (PCA): Left PCA is occluded at the P1 segment. Right PCA is normal. ANTERIOR CIRCULATION: --Intracranial internal carotid arteries: Normal. --Anterior cerebral arteries (ACA): Normal. Both A1 segments are present. Patent anterior communicating artery (a-comm). --Middle cerebral arteries (MCA): Normal. VENOUS SINUSES: As permitted by contrast timing, patent. ANATOMIC VARIANTS: None Review of the MIP images confirms the above findings. IMPRESSION: 1. 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 2. Petechial hemorrhage in the right frontal lobe at the area abnormality described on the earlier MRI. No mass effect. 3. Large amount of subcutaneous gas in the visualized upper chest and neck. This may be  secondary to a ruptured apical bulla. Aortic Atherosclerosis (ICD10-I70.0) and Emphysema (ICD10-J43.9). Electronically Signed   By: Ulyses Jarred M.D.   On: 06/01/2021 19:25   MR BRAIN W WO CONTRAST  Result Date: 06/01/2021 CLINICAL DATA:  Left facial droop, confusion, history of renal mass EXAM: MRI HEAD WITHOUT AND WITH CONTRAST TECHNIQUE: Multiplanar, multiecho pulse sequences of the brain and surrounding structures were obtained without and with intravenous contrast. CONTRAST:  7.60mL GADAVIST GADOBUTROL 1 MMOL/ML IV SOLN COMPARISON:  None. FINDINGS: Brain: There is heterogeneous cortical/subcortical diffusion signal in the right frontal lobe with involvement of the lateral precentral gyrus. There is corresponding susceptibility. Curvilinear enhancement is present. There is a possible developmental venous anomaly in this region. No nodular enhancement. Prominence of the ventricles and sulci reflects parenchymal volume loss. Additional patchy T2 hyperintensity in the supratentorial white matter is nonspecific but may reflect mild chronic microvascular ischemic changes. There is no additional abnormal enhancement. Vascular: Major vessel flow voids at the skull base are preserved. Skull and upper cervical spine: Normal marrow signal is preserved. Sinuses/Orbits: Paranasal sinuses are aerated. Bilateral lens replacements. Other: Sella is unremarkable. Minimal patchy right mastoid fluid opacification. IMPRESSION: Acute hemorrhage in the right frontal lobe involving the lateral precentral gyrus. May reflect hemorrhagic conversion of a subacute ischemic infarct given timing of symptoms. No nodular enhancement to suggest underlying metastatic lesion. There is a possible developmental venous anomaly in this region and therefore hemorrhage secondary to a cavernous malformation is also a differential consideration. These results were called by telephone at the time of interpretation on 06/01/2021 at 1:18 pm to provider  [facility]  EDWARDS , who verbally acknowledged these results. Electronically Signed   By: Macy Mis M.D.   On: 06/01/2021 13:19   US RENAL  Result Date: 06/01/2021 CLINICAL DATA:  Acute kidney injury EXAM: RENAL / URINARY TRACT ULTRASOUND COMPLETE COMPARISON:  CT 03/21/2021.  Ultrasound 07/11/2020 FINDINGS: Right Kidney: Renal measurements: 11.9 x 6.2 x 5.8 cm = volume: 221 mL. Normal echotexture. 1.9 cm cyst in the lower pole. 3 cm complex hypoechoic area within the midpole compared with 3 cm on prior CT. Left Kidney: Renal measurements: 12.8 x 6.5 x 6.0 cm = volume: 259 mL. 16 mm calcification in the upper pole compatible with nonobstructing stone. Multiple cysts measuring up to 2.8 cm. Normal echotexture. No hydronephrosis. Bladder: Appears normal for degree of bladder distention. Other: Incidentally noted gallstone within the gallbladder. IMPRESSION: Bilateral renal cysts. Complex hypoechoic area within the midpole of the right kidney measures 3 cm and is stable in size when compared to prior CT. This could be further characterized with elective renal protocol MRI if felt clinically indicated. No hydronephrosis. Cholelithiasis. Electronically Signed   By: Rolm Baptise M.D.   On: 06/01/2021 23:22   ECHOCARDIOGRAM COMPLETE  Result Date: 06/02/2021    ECHOCARDIOGRAM REPORT   Patient Name:   Angel Shelton Date of Exam: 06/02/2021 Medical Rec #:  [id number]          Height:       71.0 in Accession #:    [id number]         Weight:       160.0 lb Date of Birth:  1956/04/16          BSA:          1.918 m Patient Age:    33 years           BP:           146/75 mmHg Patient Gender: M                  HR:           74 bpm. Exam Location:  Inpatient Procedure: 2D Echo, Cardiac Doppler and Color Doppler Indications:    Stroke  History:        Patient has prior history of Echocardiogram examinations, most                 recent 03/29/2021. Stroke, Arrythmias:Atrial Fibrillation; Risk                  Factors:Hypertension, Dyslipidemia and Diabetes.  Sonographer:    Johny Chess RDCS Referring Phys: [id number] [place]  1. Left ventricular ejection fraction, by estimation, is 60 to 65%. The left ventricle has normal function. The left ventricle has no regional wall motion abnormalities. Left ventricular diastolic parameters are consistent with Grade I diastolic dysfunction (impaired relaxation).  2. Right ventricular systolic function is normal. The right ventricular size is mildly enlarged. There is normal pulmonary artery systolic pressure. The estimated right ventricular systolic pressure is 12.4 mmHg.  3. Left atrial size was severely dilated.  4. Right atrial size was moderately dilated.  5. The mitral valve is normal in structure. Mild mitral valve regurgitation. No evidence of mitral stenosis.  6. The aortic valve is normal in structure. Aortic valve regurgitation is not visualized. No aortic stenosis is present.  7. The inferior vena cava is dilated in size with >50% respiratory variability, suggesting right atrial pressure of 8 mmHg. Conclusion(s)/Recommendation(s): No intracardiac source  of embolism detected on this transthoracic study. Consider a transesophageal echocardiogram to exclude cardiac source of embolism if clinically indicated. FINDINGS  Left Ventricle: Left ventricular ejection fraction, by estimation, is 60 to 65%. The left ventricle has normal function. The left ventricle has no regional wall motion abnormalities. The left ventricular internal cavity size was normal in size. There is  no left ventricular hypertrophy. Left ventricular diastolic parameters are consistent with Grade I diastolic dysfunction (impaired relaxation). Right Ventricle: The right ventricular size is mildly enlarged. No increase in right ventricular wall thickness. Right ventricular systolic function is normal. There is normal pulmonary artery systolic pressure. The tricuspid regurgitant velocity  is 2.43  m/s, and with an assumed right atrial pressure of 8 mmHg, the estimated right ventricular systolic pressure is 06.2 mmHg. Left Atrium: Left atrial size was severely dilated. Right Atrium: Right atrial size was moderately dilated. Pericardium: There is no evidence of pericardial effusion. Mitral Valve: The mitral valve is normal in structure. Mild mitral valve regurgitation. No evidence of mitral valve stenosis. Tricuspid Valve: The tricuspid valve is normal in structure. Tricuspid valve regurgitation is mild . No evidence of tricuspid stenosis. Aortic Valve: The aortic valve is normal in structure. Aortic valve regurgitation is not visualized. No aortic stenosis is present. Aortic valve mean gradient measures 2.0 mmHg. Aortic valve peak gradient measures 3.6 mmHg. Aortic valve area, by VTI measures 2.72 cm. Pulmonic Valve: The pulmonic valve was normal in structure. Pulmonic valve regurgitation is not visualized. No evidence of pulmonic stenosis. Aorta: The aortic root is normal in size and structure. Venous: The inferior vena cava is dilated in size with greater than 50% respiratory variability, suggesting right atrial pressure of 8 mmHg. IAS/Shunts: No atrial level shunt detected by color flow Doppler.  LEFT VENTRICLE PLAX 2D LVOT diam:     2.40 cm LV SV:         57 LV SV Index:   30 LVOT Area:     4.52 cm  RIGHT VENTRICLE             IVC RV Basal diam:  4.00 cm     IVC diam: 2.30 cm RV S prime:     11.15 cm/s LEFT ATRIUM              Index        RIGHT ATRIUM           Index LA Vol (A2C):   122.0 ml 63.62 ml/m  RA Area:     25.00 cm LA Vol (A4C):   79.1 ml  41.25 ml/m  RA Volume:   81.60 ml  42.55 ml/m LA Biplane Vol: 99.1 ml  51.67 ml/m  AORTIC VALVE                    PULMONIC VALVE AV Area (Vmax):    3.04 cm     PV Vmax:       0.81 m/s AV Area (Vmean):   2.90 cm     PV Peak grad:  2.6 mmHg AV Area (VTI):     2.72 cm AV Vmax:           94.23 cm/s AV Vmean:          69.300 cm/s AV VTI:             0.209 m AV Peak Grad:      3.6 mmHg AV Mean Grad:      2.0 mmHg LVOT Vmax:  63.33 cm/s LVOT Vmean:        44.433 cm/s LVOT VTI:          0.126 m LVOT/AV VTI ratio: 0.60  AORTA Ao Root diam: 3.70 cm TRICUSPID VALVE TR Peak grad:   23.6 mmHg TR Vmax:        243.00 cm/s  SHUNTS Systemic VTI:  0.13 m Systemic Diam: 2.40 cm Candee Furbish MD Electronically signed by Candee Furbish MD Signature Date/Time: 06/02/2021/2:15:55 PM    Final         Scheduled Meds:   stroke: mapping our early stages of recovery book   Does not apply Once   atorvastatin  20 mg Oral Daily   diltiazem  240 mg Oral Daily   insulin aspart  0-15 Units Subcutaneous TID WC   insulin aspart  0-5 Units Subcutaneous QHS   metoprolol succinate  100 mg Oral Daily   Continuous Infusions:   LOS: 0 days    Time spent: 35 minutes    Kathie Dike, MD [facility]   If 7PM-7AM, please contact night-coverage [email or web address]  06/02/2021, 7:14 PM

## 2021-06-02 NOTE — Plan of Care (Signed)
Patient will remain free from falls and injury throughout shift °

## 2021-06-02 NOTE — Evaluation (Signed)
Occupational Therapy Treatment Patient Details Name: Angel Shelton MRN: [id number] DOB: [birth year] Today's Date: 06/02/2021   History of present illness 66 y.o. male with medical history significant for persistent atrial fibrillation on Eliquis (last dose 05/20/2021), T2DM, HTN, HLD, CKD stage IIIa, right renal neoplasm s/p robotic assisted partial nephrectomy 05/23/2021 who presented to the ED for evaluation of stroke.  Patient states that the same night after his nephrectomy he developed new onset of expressive aphasia.  MRI brain: Acute hemorrhage in the right frontal lobe involving the lateral  precentral gyrus. May reflect hemorrhagic conversion of a subacute  ischemic infarct given timing of symptoms. No nodular enhancement to  suggest underlying metastatic lesion. There is a possible  developmental venous anomaly in this region and therefore hemorrhage  secondary to a cavernous malformation is also a differential  consideration.   OT comments  Patient evaluated by Occupational Therapy with no further acute OT needs identified. All education has been completed and the patient has no further questions. Patient is MI for ADLs with no AD at this time. Patient endorses being at baseline for ADLs at this time. Patient noted to have reports of blurred vision in R eye more than L eye.  See below for any follow-up Occupational Therapy or equipment needs. OT is signing off. Thank you for this referral.    Recommendations for follow up therapy are one component of a multi-disciplinary discharge planning process, led by the attending physician.  Recommendations may be updated based on patient status, additional functional criteria and insurance authorization.    Follow Up Recommendations  No OT follow up    Assistance Recommended at Discharge PRN  Patient can return home with the following  Assist for transportation   Equipment Recommendations  None recommended by OT    Recommendations for Other  Services      Precautions / Restrictions Precautions Precautions: None Restrictions Weight Bearing Restrictions: No       Mobility Bed Mobility Overal bed mobility: Modified Independent                  Transfers Overall transfer level: Modified independent                       Balance Overall balance assessment: No apparent balance deficits (not formally assessed)                                         ADL either performed or assessed with clinical judgement   ADL Overall ADL's : Modified independent                                       General ADL Comments: patient able to demonstrate transfers, LB dressing, sitting and standing balance with patient noted to have walked over 600 ft with PT earlier today. patient was educated on online groceries and community resorce site to look into at home. patient verbalized understanding. patient endorses being at baseline for ADLs at this time with exception of L side droop and increased oral secretions. none noted during this session.    Extremity/Trunk Assessment Upper Extremity Assessment Upper Extremity Assessment: Overall WFL for tasks assessed (no weakness noted between two sides. 4+/5 bilaterals shoulders, elbows and hands)   Lower Extremity Assessment Lower Extremity Assessment:  Defer to PT evaluation   Cervical / Trunk Assessment Cervical / Trunk Assessment: Normal    Vision Baseline Vision/History: 1 Wears glasses Ability to See in Adequate Light: 1 Impaired Patient Visual Report: Blurring of vision Additional Comments: patient reported that R eye had more blurriness than L eye when tested individually. patient was still able to scan and track appropriately acress midline. patient reported eye surgery scheduled for friday for lasic.   Perception     Praxis      Cognition Arousal/Alertness: Awake/alert Behavior During Therapy: WFL for tasks  assessed/performed Overall Cognitive Status: Within Functional Limits for tasks assessed                                 General Comments: patient was able to complete clokc draw test with numbers around the circle of clock with patient drawing lines to 10 to 11 first prior to drawing numbers around outer edge of circle in correct spaces. patient reponses with WNL.          Exercises     Shoulder Instructions       General Comments      Pertinent Vitals/ Pain       Pain Assessment: No/denies pain  Home Living Family/patient expects to be discharged to:: Private residence Living Arrangements: Alone   Type of Home: [place]: One level               Home Equipment: None          Prior Functioning/Environment              Frequency           Progress Toward Goals  OT Goals(current goals can now be found in the care plan section)     Acute Rehab OT Goals OT Goal Formulation: All assessment and education complete, DC therapy  Plan      Co-evaluation                 AM-PAC OT "6 Clicks" Daily Activity     Outcome Measure   Help from another person eating meals?: None Help from another person taking care of personal grooming?: None Help from another person toileting, which includes using toliet, bedpan, or urinal?: None Help from another person bathing (including washing, rinsing, drying)?: None Help from another person to put on and taking off regular upper body clothing?: None Help from another person to put on and taking off regular lower body clothing?: None 6 Click Score: 24    End of Session    OT Visit Diagnosis: Unsteadiness on feet (R26.81)   Activity Tolerance Patient tolerated treatment well   Patient Left in bed;with call bell/phone within reach   Nurse Communication          Time: 7867-5449 OT Time Calculation (min): 12 min  Charges: OT General Charges $OT Visit: 1 Visit OT  Evaluation $OT Eval Low Complexity: 1 Low  Leota Sauers, MS Acute Rehabilitation Department Office# [phone] Pager# [phone]   Marcellina Millin 06/02/2021, 4:34 PM

## 2021-06-02 NOTE — Evaluation (Signed)
Physical Therapy One Time Evaluation Patient Details Name: ARNALDO HEFFRON MRN: [id number] DOB: May 16, 1956 Today's Date: 06/02/2021  History of Present Illness  66 y.o. male with medical history significant for persistent atrial fibrillation on Eliquis (last dose 05/20/2021), T2DM, HTN, HLD, CKD stage IIIa, right renal neoplasm s/p robotic assisted partial nephrectomy 05/23/2021 who presented to the ED for evaluation of stroke.  Patient states that the same night after his nephrectomy he developed new onset of expressive aphasia.  MRI brain: Acute hemorrhage in the right frontal lobe involving the lateral  precentral gyrus. May reflect hemorrhagic conversion of a subacute  ischemic infarct given timing of symptoms. No nodular enhancement to  suggest underlying metastatic lesion. There is a possible  developmental venous anomaly in this region and therefore hemorrhage  secondary to a cavernous malformation is also a differential  consideration.  Clinical Impression  Patient evaluated by Physical Therapy (in the ED) with no further acute PT needs identified. All education has been completed and the patient has no further questions.  Pt denies any changes in upper and lower extremities.  Pt able to ambulate a couple times around unit without symptoms, also no unsteadiness.  Pt denies changes to balance.  Pt reports only changes from stroke are left facial droop and slurred speech.  See below for any follow-up Physical Therapy or equipment needs. PT is signing off. Thank you for this referral.  Vitals after ambulating: BP 151/95 mmHg, HR 77 bpm, SPO2 93% room air quickly improving to 98% room air        Recommendations for follow up therapy are one component of a multi-disciplinary discharge planning process, led by the attending physician.  Recommendations may be updated based on patient status, additional functional criteria and insurance authorization.  Follow Up Recommendations No PT follow up     Assistance Recommended at Discharge None  Patient can return home with the following       Equipment Recommendations None recommended by PT  Recommendations for Other Services       Functional Status Assessment Patient has not had a recent decline in their functional status     Precautions / Restrictions Precautions Precautions: None      Mobility  Bed Mobility Overal bed mobility: Modified Independent                  Transfers Overall transfer level: Modified independent                      Ambulation/Gait Ambulation/Gait assistance: Modified independent (Device/Increase time) Gait Distance (Feet): 600 Feet Assistive device: None Gait Pattern/deviations: WFL(Within Functional Limits)       General Gait Details: no symptoms  Stairs            Wheelchair Mobility    Modified Rankin (Stroke Patients Only)       Balance Overall balance assessment: No apparent balance deficits (not formally assessed) (pt also denies any balance difficulties)                                           Pertinent Vitals/Pain Pain Assessment: No/denies pain    Home Living Family/patient expects to be discharged to:: Private residence Living Arrangements: Alone   Type of Home: House         Home Layout: One level Home Equipment: None  Prior Function Prior Level of Function : Independent/Modified Independent                     Hand Dominance        Extremity/Trunk Assessment   Upper Extremity Assessment Upper Extremity Assessment: Overall WFL for tasks assessed    Lower Extremity Assessment Lower Extremity Assessment: Overall WFL for tasks assessed    Cervical / Trunk Assessment Cervical / Trunk Assessment: Normal  Communication   Communication: Expressive difficulties  Cognition Arousal/Alertness: Awake/alert Behavior During Therapy: WFL for tasks assessed/performed Overall Cognitive Status: Within  Functional Limits for tasks assessed                                          General Comments      Exercises     Assessment/Plan    PT Assessment Patient does not need any further PT services  PT Problem List         PT Treatment Interventions      PT Goals (Current goals can be found in the Care Plan section)  Acute Rehab PT Goals PT Goal Formulation: All assessment and education complete, DC therapy    Frequency       Co-evaluation               AM-PAC PT "6 Clicks" Mobility  Outcome Measure Help needed turning from your back to your side while in a flat bed without using bedrails?: None Help needed moving from lying on your back to sitting on the side of a flat bed without using bedrails?: None Help needed moving to and from a bed to a chair (including a wheelchair)?: None Help needed standing up from a chair using your arms (e.g., wheelchair or bedside chair)?: None Help needed to walk in hospital room?: None Help needed climbing 3-5 steps with a railing? : None 6 Click Score: 24    End of Session   Activity Tolerance: Patient tolerated treatment well Patient left: in bed;with call bell/phone within reach Nurse Communication: Mobility status PT Visit Diagnosis: Other symptoms and signs involving the nervous system (R29.898)    Time: 3570-1779 PT Time Calculation (min) (ACUTE ONLY): 16 min   Charges:   PT Evaluation $PT Eval Low Complexity: 1 Low     Kati PT, DPT Acute Rehabilitation Services Pager: [phone] Office: [place] 06/02/2021, 12:43 PM

## 2021-06-02 NOTE — Consult Note (Signed)
NEUROLOGY CONSULTATION NOTE   Date of service: June 02, 2021 Patient Name: Angel Shelton MRN:  [id number] DOB:  1956/04/27 Reason for consult: stroke Requesting physician: Dr. Benay Pike MD _ _ _   _ __   _ __ _ _  __ __   _ __   __ _  History of Present Illness   This a 66 year old gentleman with a past medical history significant for persistent A. fib on Eliquis last dose 05/20/2021 held for procedure, diabetes type 2, hypertension, hyperlipidemia, CKD stage IIIa, right renal neoplasm status post robotic assisted partial nephrectomy May 23, 2021 who developed left facial droop and dysarthria around December 31.  Outpatient MRI brain was performed which revealed acute hemorrhage in the right frontal lobe involving the lateral precentral gyrus suggestive of hemorrhagic conversion of subacute ischemic infarct versus hemorrhage secondary to cavernous malformation given possible developmental venous anomaly in that region.  MRI brain was personally reviewed.  CT head and CTA head and neck performed yesterday in the ED showed petechial hemorrhage in the right frontal lobe at the area of abnormality described on the earlier MRI with no mass-effect and no frank hematoma.  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  TTE which showed severe left atrial dilatation but no intracardiac clot.  LDL and A1c are pending.  Eliquis is currently being held.   ROS   Per HPI: all other systems reviewed and are negative  Past History   I have reviewed the following:  Past Medical History:  Diagnosis Date   Alcohol abuse    Diabetes mellitus without complication (HCC)    Diverticulosis    GERD (gastroesophageal reflux disease)    Hyperlipidemia    Hypertension    Left atrial enlargement    Neuromuscular disorder (HCC)    neuropathy   Persistent atrial fibrillation  (HCC)    CHA2DS2-VASc score 4 (age-18, HTN-1, DM 2-1, aortic plaque)   Past Surgical History:  Procedure Laterality Date   APPENDECTOMY     CARDIAC CATHETERIZATION N/A 01/05/2015   Procedure: Left Heart Cath and Coronary Angiography;  Surgeon: Pixie Casino, MD;  Location: [place];  Service: Cardiovascular;; (For Abnormal Nuclear Stress Test) mild luminal irregularities of the LCx with mild areas of ectasia.  Calcified ostial D1 mild stenosis. ->  Mild/nonobstructive CAD.   CARDIOVERSION N/A 11/01/2014   Procedure: CARDIOVERSION;  Surgeon: Pixie Casino, MD;  Location: [facility] ENDOSCOPY;  Service: Cardiovascular;  Laterality: N/A;   CARDIOVERSION N/A 02/24/2015   Procedure: CARDIOVERSION;  Surgeon: Pixie Casino, MD;  Location: [facility] ENDOSCOPY;  Service: Cardiovascular;  Laterality: N/A;   CATARACT EXTRACTION Bilateral    COLON SURGERY N/A 2006   14 inches removed from diverticitis perforation   COLONOSCOPY     Per stoma   EYE SURGERY Bilateral    Cat Sx   INNER EAR SURGERY     as a child   NM MYOVIEW LTD  12/08/2014   FALSE POSITIVE: Read asINTERMEDIATE RISK.  Partially reversible medium size moderate severity defect in the basal--mid-apical inferoseptal-inferior and apical walls.  Not gated due to A. fib. ->  Referred for cath- NON-OBSTRUCTIVE CAD   ROBOTIC ASSITED PARTIAL NEPHRECTOMY Right 05/23/2021   Procedure: XI RETROPERITONEAL ROBOTIC ASSITED PARTIAL NEPHRECTOMY;  Surgeon: Alexis Frock, MD;  Location: [facility];  Service: Urology;  Laterality: Right;  TEE WITHOUT CARDIOVERSION N/A 11/01/2014   Procedure: TRANSESOPHAGEAL ECHOCARDIOGRAM (TEE) -unsuccesful DCCV;  Surgeon: Pixie Casino, MD;  Location: [place];  Service: Cardiovascular;; Mild concentric LVH.  EF 55 to 60%.  Normal wall motion.  Grade 3 aortic atheroma.  No source of cardiac emboli.  -->  Followed by unsuccessful DCCV.   Family History  Problem Relation Age of Onset   Heart disease Father     Stroke Father    Diabetes Mother    Diabetes Maternal Grandmother    Stroke Maternal Grandmother    Heart attack Paternal Grandfather    Hodgkin's lymphoma Paternal Aunt    Hodgkin's lymphoma Paternal Uncle    Colon cancer Neg Hx    Esophageal cancer Neg Hx    Pancreatic cancer Neg Hx    Prostate cancer Neg Hx    Rectal cancer Neg Hx    Stomach cancer Neg Hx    Social History   Socioeconomic History   Marital status: Single    Spouse name: Not on file   Number of children: 2   Years of education: Not on file   Highest education level: Not on file  Occupational History   Occupation: Building services engineer  Tobacco Use   Smoking status: Former    Packs/day: 1.00    Years: 30.00    Pack years: 30.00    Types: Cigarettes    Quit date: 03/05/2014    Years since quitting: 7.2   Smokeless tobacco: Never  Vaping Use   Vaping Use: Never used  Substance and Sexual Activity   Alcohol use: Yes    Alcohol/week: 14.0 standard drinks    Types: 14 Shots of liquor per week   Drug use: No   Sexual activity: Not on file  Other Topics Concern   Not on file  Social History Narrative   Works in the Hospital doctor   Social Determinants of Radio broadcast assistant Strain: Not on file  Food Insecurity: Not on file  Transportation Needs: Not on file  Physical Activity: Not on file  Stress: Not on file  Social Connections: Not on file   No Known Allergies  Medications   (Not in a hospital admission)     Current Facility-Administered Medications:     stroke: mapping our early stages of recovery book, , Does not apply, Once, Lenore Cordia, MD   acetaminophen (TYLENOL) tablet 650 mg, 650 mg, Oral, Q4H PRN **OR** acetaminophen (TYLENOL) 160 MG/5ML solution 650 mg, 650 mg, Per Tube, Q4H PRN **OR** acetaminophen (TYLENOL) suppository 650 mg, 650 mg, Rectal, Q4H PRN, Posey Pronto, Vishal R, MD   atorvastatin (LIPITOR) tablet 20 mg, 20 mg, Oral,  Daily, Posey Pronto, Vishal R, MD, 20 mg at 06/02/21 0939   diltiazem (CARDIZEM CD) 24 hr capsule 240 mg, 240 mg, Oral, Daily, Plunkett, Whitney, MD, 240 mg at 06/02/21 0939   insulin aspart (novoLOG) injection 0-15 Units, 0-15 Units, Subcutaneous, TID WC, Zada Finders R, MD, 3 Units at 06/02/21 1813   insulin aspart (novoLOG) injection 0-5 Units, 0-5 Units, Subcutaneous, QHS, Patel, Vishal R, MD   metoprolol succinate (TOPROL-XL) 24 hr tablet 100 mg, 100 mg, Oral, Daily, Plunkett, Whitney, MD, 100 mg at 06/02/21 0939   senna-docusate (Senokot-S) tablet 1 tablet, 1 tablet, Oral, QHS PRN, Lenore Cordia, MD  Current Outpatient Medications:    apixaban (ELIQUIS) 5 MG TABS tablet, Take 5 mg by mouth 2 (two) times daily., Disp: , Rfl:    atorvastatin (LIPITOR)  20 MG tablet, Take 20 mg by mouth daily., Disp: , Rfl:    Cyanocobalamin (VITAMIN B-12 PO), Take 1 tablet by mouth daily., Disp: , Rfl:    diltiazem (CARDIZEM CD) 240 MG 24 hr capsule, Take 240 mg by mouth daily., Disp: , Rfl:    fenofibrate 160 MG tablet, Take 160 mg by mouth daily., Disp: , Rfl:    gabapentin (NEURONTIN) 300 MG capsule, Take 300 mg by mouth every 8 (eight) hours., Disp: , Rfl:    Lactobacillus-Inulin (PROBIOTIC DIGESTIVE SUPPORT PO), Take 1 tablet by mouth daily., Disp: , Rfl:    lisinopril-hydrochlorothiazide (ZESTORETIC) 20-12.5 MG tablet, Take 1 tablet by mouth daily., Disp: , Rfl:    metFORMIN (GLUCOPHAGE) 1000 MG tablet, Take 1,000 mg by mouth 2 (two) times daily., Disp: , Rfl:    metoprolol succinate (TOPROL-XL) 100 MG 24 hr tablet, Take 100 mg by mouth daily., Disp: , Rfl: 1   Omega-3 Fatty Acids (FISH OIL) 1000 MG CAPS, Take 1,200 mg by mouth 2 (two) times daily., Disp: , Rfl:    Wheat Dextrin (BENEFIBER DRINK MIX) PACK, Take 1 Package by mouth daily., Disp: , Rfl:    hydrocortisone (ANUSOL-HC) 25 MG suppository, Use 1 suppository at bedtime for 14 days, then repeat as needed. (Patient not taking:  Reported on 06/01/2021), Disp: 14 suppository, Rfl: 2   oxyCODONE-acetaminophen (PERCOCET) 5-325 MG tablet, Take 1 tablet by mouth every 6 (six) hours as needed for severe pain or moderate pain (post-operatively). (Patient not taking: Reported on 06/01/2021), Disp: 15 tablet, Rfl: 0   senna-docusate (SENOKOT-S) 8.6-50 MG tablet, Take 1 tablet by mouth 2 (two) times daily. While taking strong pain meds to prevent constipation (Patient not taking: Reported on 06/01/2021), Disp: 10 tablet, Rfl: 0  Vitals   Vitals:   06/02/21 0900 06/02/21 0906 06/02/21 1215 06/02/21 1704  BP: (!) 153/90 (!) 163/91 131/82 130/69  Pulse: 80 84 74 (!) 56  Resp: (!) $RemoveB'25 18 12 13  'prPkxyrC$ Temp:  98.1 F (36.7 C) 98.1 F (36.7 C) 98.1 F (36.7 C)  TempSrc:  Oral Oral Oral  SpO2: 100%  100% 100%  Weight:      Height:         Body mass index is 22.32 kg/m.  Physical Exam   Physical Exam Gen: A&O x4, NAD HEENT: Atraumatic, normocephalic;mucous membranes moist; oropharynx clear, tongue without atrophy or fasciculations. Neck: Supple, trachea midline. Resp: CTAB, no w/r/r CV: RRR, no m/g/r; nml S1 and S2. 2+ symmetric peripheral pulses. Abd: soft/NT/ND; nabs x 4 quad Extrem: Nml bulk; no cyanosis, clubbing, or edema.  Neuro: *MS: A&O x4. Follows multi-step commands.  *Speech: mild dysarthria, able to name and repeat *CN:    I: Deferred   II,III: PERRLA, VFF by confrontation, optic discs unable to be visualized 2/2 pupillary constriction   III,IV,VI: EOMI w/o nystagmus, no ptosis   V: Sensation intact from V1 to V3 to LT   VII: Eyelid closure was full.  L UMN facial droop   VIII: Hearing intact to voice   IX,X: Voice normal, palate elevates symmetrically    XI: SCM/trap 5/5 bilat   XII: Tongue protrudes midline, no atrophy or fasciculations   *Motor:   Normal bulk.  No tremor, rigidity or bradykinesia. No pronator drift.    Strength: Dlt Bic Tri WrE WrF FgS Gr HF KnF KnE PlF DoF    Left $Rem'5 5 5 5 5 5 5 5 5 5 5  5    'GgzZ$ Right  $'5 5 5 5 5 5 5 5 5 5 5 5    'R$ *Sensory: Intact to light touch, pinprick, temperature vibration throughout. Symmetric. Propioception intact bilat.  No double-simultaneous extinction.  *Coordination:  Finger-to-nose, heel-to-shin, rapid alternating motions were intact. *Reflexes:  1+ and symmetric throughout without clonus; toes down-going bilat *Gait: deferred  NIHSS = 2 for dysarthria and facial droop  Premorbid mRS = 0   Labs   CBC:  Recent Labs  Lab 06/01/21 1609 06/02/21 0500  WBC 8.0 7.2  NEUTROABS 4.3  --   HGB 13.9 12.9*  HCT 40.9 38.5*  MCV 99.3 98.2  PLT 259 437    Basic Metabolic Panel:  Lab Results  Component Value Date   NA 131 (L) 06/02/2021   K 4.4 06/02/2021   CO2 28 06/02/2021   GLUCOSE 172 (H) 06/02/2021   BUN 20 06/02/2021   CREATININE 1.09 06/02/2021   CALCIUM 10.0 06/02/2021   GFRNONAA >60 06/02/2021   GFRAA  06/17/2007    >60        The eGFR has been calculated using the MDRD equation. This calculation has not been validated in all clinical   Lipid Panel: No results found for: LDLCALC HgbA1c:  Lab Results  Component Value Date   HGBA1C 5.9 (H) 04/13/2021   Urine Drug Screen:     Component Value Date/Time   LABOPIA NONE DETECTED 06/01/2021 2143   COCAINSCRNUR NONE DETECTED 06/01/2021 2143   LABBENZ NONE DETECTED 06/01/2021 2143   AMPHETMU NONE DETECTED 06/01/2021 2143   THCU NONE DETECTED 06/01/2021 2143   LABBARB NONE DETECTED 06/01/2021 2143    Alcohol Level     Component Value Date/Time   ETH <10 06/01/2021 1609    Impression   This a 66 year old gentleman with a past medical history significant for persistent A. fib on Eliquis last dose 05/20/2021 held for procedure, diabetes type 2, hypertension, hyperlipidemia, CKD stage IIIa, right renal neoplasm status post robotic assisted partial nephrectomy May 23, 2021 who developed left facial droop and dysarthria around December 31.  Outpatient MRI brain was performed  which revealed acute hemorrhage in the right frontal lobe involving the lateral precentral gyrus suggestive of hemorrhagic conversion of subacute ischemic infarct versus hemorrhage secondary to cavernous malformation given possible developmental venous anomaly in that region.  Subsequent head CT did not show frank hematoma; c/w petechial hemorrhage in that area.  Recommendations   - I will contact the stroke attending tmrw AM to get his thoughts on timing of anticoagulation restart. I suspect eliquis will be held at hospital discharge and restart considered in clinic as outpatient, but I will confirm. In the meantime, hold eliquis. ASA $RemoveBefo'81mg'HwrEawLuyfQ$  daily while eliquis is being held. - I will contact neuro IR tmrw to get their thoughts on whether MRI findings could represent cavernous malformation - Otherwise stroke workup is completed, patient can likely be discharged after neuro makes final recommendations - Atorvastatin $RemoveBefore'80mg'YhSXEBDSLTOOg$  daily - PT/OT - stroke education - I will arrange outpatient neuro f/u ______________________________________________________________________   Thank you for the opportunity to take part in the care of this patient. If you have any further questions, please contact the neurology consultation attending.  Signed,  Su Monks, MD [facility] [phone]  If 7pm- 7am, please page neurology on call as listed in [place].

## 2021-06-03 DIAGNOSIS — N179 Acute kidney failure, unspecified: Secondary | ICD-10-CM | POA: Diagnosis not present

## 2021-06-03 DIAGNOSIS — E1169 Type 2 diabetes mellitus with other specified complication: Secondary | ICD-10-CM | POA: Diagnosis not present

## 2021-06-03 DIAGNOSIS — I639 Cerebral infarction, unspecified: Secondary | ICD-10-CM | POA: Diagnosis not present

## 2021-06-03 DIAGNOSIS — E119 Type 2 diabetes mellitus without complications: Secondary | ICD-10-CM | POA: Diagnosis not present

## 2021-06-03 LAB — LIPID PANEL
Cholesterol: 115 mg/dL (ref 0–200)
HDL: 32 mg/dL — ABNORMAL LOW (ref >40)
LDL Cholesterol: 62 mg/dL (ref 0–99)
Total CHOL/HDL Ratio: 3.6 RATIO
Triglycerides: 103 mg/dL (ref <150)
VLDL: 21 mg/dL (ref 0–40)

## 2021-06-03 LAB — HEMOGLOBIN A1C
Hgb A1c MFr Bld: 6.2 % — ABNORMAL HIGH (ref 4.8–5.6)
Mean Plasma Glucose: 131.24 mg/dL

## 2021-06-03 LAB — GLUCOSE, CAPILLARY: Glucose-Capillary: 212 mg/dL — ABNORMAL HIGH (ref 70–99)

## 2021-06-03 MED ORDER — ASPIRIN 81 MG PO CHEW: 81.0000 mg | CHEWABLE_TABLET | Freq: Every day | ORAL | 1 refills | Status: DC

## 2021-06-03 MED ORDER — ATORVASTATIN CALCIUM 80 MG PO TABS: 80.0000 mg | ORAL_TABLET | Freq: Every day | ORAL | 0 refills | Status: DC

## 2021-06-03 NOTE — Evaluation (Signed)
Speech Language Pathology Evaluation Patient Details Name: Angel Shelton MRN: [id number] DOB: 02/10/1956 Today's Date: 06/03/2021 Time: 6195-0932 SLP Time Calculation (min) (ACUTE ONLY): 22 min  Problem List:  Patient Active Problem List   Diagnosis Date Noted   Stroke ([place]) 06/02/2021   Ischemic stroke of frontal lobe ([employer]) 06/01/2021   Acute kidney injury superimposed on CKD ([place]) 06/01/2021   Renal mass 05/23/2021   Aortic atherosclerosis ([place]) 04/10/2021   Preop cardiovascular exam 02/09/2021   Permanent atrial fibrillation (HCC) -CHA2DS2-VASc score 4 11/24/2014   Encounter for cardioversion procedure    DM2 (diabetes mellitus, type 2) ([place]) 09/29/2014   Essential hypertension 09/29/2014   Dyslipidemia associated with type 2 diabetes mellitus ([place]) 09/29/2014   Past Medical History:  Past Medical History:  Diagnosis Date   Alcohol abuse    Diabetes mellitus without complication (HCC)    Diverticulosis    GERD (gastroesophageal reflux disease)    Hyperlipidemia    Hypertension    Left atrial enlargement    Neuromuscular disorder (HCC)    neuropathy   Persistent atrial fibrillation (HCC)    CHA2DS2-VASc score 4 (age-82, HTN-1, DM 2-1, aortic plaque)   Past Surgical History:  Past Surgical History:  Procedure Laterality Date   APPENDECTOMY     CARDIAC CATHETERIZATION N/A 01/05/2015   Procedure: Left Heart Cath and Coronary Angiography;  Surgeon: Pixie Casino, MD;  Location: [facility];  Service: Cardiovascular;; (For Abnormal Nuclear Stress Test) mild luminal irregularities of the LCx with mild areas of ectasia.  Calcified ostial D1 mild stenosis. ->  Mild/nonobstructive CAD.   CARDIOVERSION N/A 11/01/2014   Procedure: CARDIOVERSION;  Surgeon: Pixie Casino, MD;  Location: [facility] ENDOSCOPY;  Service: Cardiovascular;  Laterality: N/A;   CARDIOVERSION N/A 02/24/2015   Procedure: CARDIOVERSION;  Surgeon: Pixie Casino, MD;  Location: [facility] ENDOSCOPY;  Service:  Cardiovascular;  Laterality: N/A;   CATARACT EXTRACTION Bilateral    COLON SURGERY N/A 2006   14 inches removed from diverticitis perforation   COLONOSCOPY     Per stoma   EYE SURGERY Bilateral    Cat Sx   INNER EAR SURGERY     as a child   NM MYOVIEW LTD  12/08/2014   FALSE POSITIVE: Read asINTERMEDIATE RISK.  Partially reversible medium size moderate severity defect in the basal--mid-apical inferoseptal-inferior and apical walls.  Not gated due to A. fib. ->  Referred for cath- NON-OBSTRUCTIVE CAD   ROBOTIC ASSITED PARTIAL NEPHRECTOMY Right 05/23/2021   Procedure: XI RETROPERITONEAL ROBOTIC ASSITED PARTIAL NEPHRECTOMY;  Surgeon: Alexis Frock, MD;  Location: [facility];  Service: Urology;  Laterality: Right;   TEE WITHOUT CARDIOVERSION N/A 11/01/2014   Procedure: TRANSESOPHAGEAL ECHOCARDIOGRAM (TEE) -unsuccesful DCCV;  Surgeon: Pixie Casino, MD;  Location: [place];  Service: Cardiovascular;; Mild concentric LVH.  EF 55 to 60%.  Normal wall motion.  Grade 3 aortic atheroma.  No source of cardiac emboli.  -->  Followed by unsuccessful DCCV.   HPI:  Angel Shelton is a 66 y.o. male with medical history significant for persistent atrial fibrillation on Eliquis (last dose 05/20/2021), T2DM, HTN, HLD, CKD stage IIIa, right renal neoplasm s/p robotic assisted partial nephrectomy 05/23/2021 who presented to the ED for evaluation of stroke. Imaging revealed R frontal hemorrhage.   Assessment / Plan / Recommendation Clinical Impression  Angel Shelton demonstrates a mild flaccid dysarthria, characterized by imprecise production of consonants. Intelligibility is very minimally impacted at roughly 95% (single request for repetition required). Oral  mech exam reveals L sided facial and lingual flaccidity. Language was evaluated with the [place] with pt scoring 100/100. No language impairment identified. Speech production is impaired. Cognition was not fully assessed, but  pt answered questions regarding orientation, short term memory of day's events, and medication management questions without difficulty. He was provided speech strategies (SLOPE) for speak slowly, loudly, over articulate, pause between words, and every sound counts. He verbalized understanding and demonstrated independent use.   No further ST indicated on acute level. Should deficits not resolve, pt is recommended for outpatient speech therapy to treat dysarthria and further assess executive functions.    SLP Assessment  SLP Recommendation/Assessment: Patient needs continued Speech [facility] SLP Visit Diagnosis: Dysarthria and anarthria (R47.1)    Recommendations for follow up therapy are one component of a multi-disciplinary discharge planning process, led by the attending physician.  Recommendations may be updated based on patient status, additional functional criteria and insurance authorization.    Follow Up Recommendations  Outpatient SLP    Assistance Recommended at Discharge  None  Functional Status Assessment Patient has had a recent decline in their functional status and demonstrates the ability to make significant improvements in function in a reasonable and predictable amount of time.  Frequency and Duration     No treatment indicated at acute phase.  Follow up as outpatient.      SLP Evaluation Cognition  Overall Cognitive Status: Within Functional Limits for tasks assessed Arousal/Alertness: Awake/alert Orientation Level: Oriented X4       Comprehension  Auditory Comprehension Overall Auditory Comprehension: Appears within functional limits for tasks assessed Reading Comprehension Reading Status: Within funtional limits    Expression Expression Primary Mode of Expression: Verbal Verbal Expression Overall Verbal Expression: Appears within functional limits for tasks assessed Written Expression Dominant Hand: Right Written Expression: Within  Functional Limits   Oral / Motor  Oral Motor/Sensory Function Overall Oral Motor/Sensory Function: Moderate impairment Facial ROM: Reduced left Facial Symmetry: Abnormal symmetry left Facial Strength: Reduced left Lingual ROM: Reduced left Lingual Symmetry: Abnormal symmetry left Lingual Strength: Suspected CN XII (hypoglossal) dysfunction Velum: Within Functional Limits Motor Speech Overall Motor Speech: Impaired Respiration: Within functional limits Phonation: Normal Resonance: Within functional limits Articulation: Impaired Level of Impairment: Word Intelligibility: Intelligibility reduced Word: 75-100% accurate Phrase: 75-100% accurate Sentence: 75-100% accurate Conversation: 75-100% accurate Motor Planning: Witnin functional limits Motor Speech Errors: Not applicable Effective Techniques: Slow rate;Over-articulate;Increased vocal intensity;Pause   Deago Burruss P. Makinzie Considine, M.S., [place] Pathologist Acute Rehabilitation Services Pager: [place] 06/03/2021, 9:47 AM

## 2021-06-03 NOTE — Plan of Care (Signed)
Neurology plan of care  Please see full consult note from yesterday for findings and recommendations. I discussed imaging with neuro-IR this AM. Dr. Ladean Raya felt that there did appear to be a DVA there, but the bleed looks to be following the cortex and less modular than would be expected for a cavernoma. Given the significant petechial hemorrhage and possible underlying cavernoma, recommend holding eliquis for a fib at discharge (d/c on ASA 81mg  daily only) until outpatient f/u with neurology. Per neuro-IR, patient should have repeat MRI brain in 4-6 wks to reassess possible cavernoma.  Su Monks, MD [facility] [phone]  If 7pm- 7am, please page neurology on call as listed in [place].

## 2021-06-03 NOTE — Discharge Summary (Signed)
Physician Discharge Summary  Angel Shelton LGX:[id number] DOB: [birth year] DOA: 06/01/2021  PCP: Ginger Organ., MD  Admit date: 06/01/2021 Discharge date: 06/03/2021  Admitted From: Home Disposition: Home  Recommendations for Outpatient Follow-up:  Outpatient follow-up with neurology will be scheduled PCP follow-up in 2 to 3 weeks Will need repeat MRI brain in 4 to 6 weeks to reassess possible cavernoma Will need repeat chest x-ray in the next 3 weeks to ensure resolution of subcutaneous emphysema at the base of the neck. Outpatient referral to speech therapy has been made  Home Health: Equipment/Devices:  Discharge Condition: Stable CODE STATUS: Full code Diet recommendation: Heart healthy, carb modified  Brief/Interim Summary: 66 year old male with a history of persistent atrial fibrillation, hypertension, diabetes, recent partial nephrectomy for right-sided neoplasm, admitted to the hospital with a several day history of right facial droop and dysarthria.  Found to have subacute infarct with possible hemorrhagic conversion.  Admitted for further work-up.  Discharge Diagnoses:  Principal Problem:   Ischemic stroke of frontal lobe (HCC) Active Problems:   DM2 (diabetes mellitus, type 2) ([place])   Essential hypertension   Dyslipidemia associated with type 2 diabetes mellitus (HCC)   Permanent atrial fibrillation (HCC) -CHA2DS2-VASc score 4   Acute kidney injury superimposed on CKD ([place])   Stroke ([place])  Ischemic stroke of right frontal lobe with likely hemorrhagic conversion -Presented with left facial droop, dysarthria -Symptoms started on 12/28 -CTA of the head and neck shows chronic appearing occlusion of the left posterior scrotal artery at the P1 segment and petechial hemorrhage right frontal lobe -Patient was taken Eliquis for atrial fibrillation, but this was held since 12/25 for recent surgery -Aspirin Eliquis initially held until further recommendations from  neurology were obtained -Echocardiogram unremarkable -Seen by PT/OT with no further follow-up recommended -Speech therapy recommended outpatient speech therapy -LDL 62 -A1c 6.2 -Initial plans were to transfer the patient to [name] for further neurology evaluation.  Case reviewed with Dr. Quinn Axe who had seen the patient and did not feel that he would need to be transferred and could be managed at [facility] long. -After further review, neuro has recommended to hold Eliquis on discharge until he can follow-up with outpatient neurology -He can continue on aspirin 81 mg daily on discharge -Lipitor should be increased to 80 mg daily   AKI on CKD stage IIIa -Holding lisinopril/hydrochlorothiazide -Creatinine 1.7 on admission -This has since normalized to 1.0 -We will resume lisinopril/hydrochlorothiazide as an outpatient   Hyperlipidemia -Continue statin.  Lipitor increased to 80 mg daily in the setting of acute stroke   Diabetes, type II with hyperglycemia -A1c 6.2 -Continue outpatient management   Right renal neoplasm -Status post partial nephrectomy on 12/28 -Continue follow-up with urology   Persistent atrial fibrillation -He is continued on his home dose of Toprol and diltiazem -Anticoagulation on hold until follow-up with outpatient neurology -Continue on aspirin   Subcutaneous emphysema -Incidental finding of subcutaneous emphysema at base of neck likely chest wall -Noted to have some emphysematous changes on chest x-ray -May be related to recent surgery -Does not have any shortness of breath or chest pain -Reviewed with Dr. Melvyn Novas, pulmonology who did not feel that any further work-up at this time was indicated since he was asymptomatic -Since the patient does have emphysematous changes on chest x-ray, he will need repeat chest x-ray in the next 2 to 3 weeks to ensure resolution.  This can be arranged by his primary care physician  Discharge Instructions  Discharge  Instructions     Ambulatory referral to Neurology   Complete by: As directed    An appointment is requested in approximately: 2-4 wks   Ambulatory referral to Speech Therapy   Complete by: As directed    Diet - low sodium heart healthy   Complete by: As directed    Increase activity slowly   Complete by: As directed       Allergies as of 06/03/2021   No Known Allergies      Medication List     STOP taking these medications    apixaban 5 MG Tabs tablet Commonly known as: ELIQUIS   hydrocortisone 25 MG suppository Commonly known as: ANUSOL-HC   oxyCODONE-acetaminophen 5-325 MG tablet Commonly known as: Percocet   senna-docusate 8.6-50 MG tablet Commonly known as: Senokot-S       TAKE these medications    aspirin 81 MG chewable tablet Chew 1 tablet (81 mg total) by mouth daily. Start taking on: June 04, 2021   atorvastatin 80 MG tablet Commonly known as: LIPITOR Take 1 tablet (80 mg total) by mouth daily. What changed:  medication strength how much to take   Benefiber Drink Mix Pack Take 1 Package by mouth daily.   diltiazem 240 MG 24 hr capsule Commonly known as: CARDIZEM CD Take 240 mg by mouth daily.   fenofibrate 160 MG tablet Take 160 mg by mouth daily.   Fish Oil 1000 MG Caps Take 1,200 mg by mouth 2 (two) times daily.   gabapentin 300 MG capsule Commonly known as: NEURONTIN Take 300 mg by mouth every 8 (eight) hours.   lisinopril-hydrochlorothiazide 20-12.5 MG tablet Commonly known as: ZESTORETIC Take 1 tablet by mouth daily.   metFORMIN 1000 MG tablet Commonly known as: GLUCOPHAGE Take 1,000 mg by mouth 2 (two) times daily.   metoprolol succinate 100 MG 24 hr tablet Commonly known as: TOPROL-XL Take 100 mg by mouth daily.   PROBIOTIC DIGESTIVE SUPPORT PO Take 1 tablet by mouth daily.   VITAMIN B-12 PO Take 1 tablet by mouth daily.        No Known Allergies  Consultations: Neurology   Procedures/Studies: CT Angio  Head W or Wo Contrast  Result Date: 06/01/2021 CLINICAL DATA:  Stroke follow-up EXAM: CT ANGIOGRAPHY HEAD AND NECK TECHNIQUE: Multidetector CT imaging of the head and neck was performed using the standard protocol during bolus administration of intravenous contrast. Multiplanar CT image reconstructions and MIPs were obtained to evaluate the vascular anatomy. Carotid stenosis measurements (when applicable) are obtained utilizing NASCET criteria, using the distal internal carotid diameter as the denominator. CONTRAST:  49mL OMNIPAQUE IOHEXOL 350 MG/ML SOLN COMPARISON:  Brain MRI 06/01/2021 FINDINGS: CT HEAD FINDINGS Brain: Petechial hemorrhage in the right frontal lobe at the area abnormality described on the earlier MRI. No mass effect. There is generalized atrophy without lobar predilection. There is hypoattenuation of the periventricular white matter, most commonly indicating chronic ischemic microangiopathy. Skull: The visualized skull base, calvarium and extracranial soft tissues are normal. Sinuses/Orbits: No fluid levels or advanced mucosal thickening of the visualized paranasal sinuses. No mastoid or middle ear effusion. The orbits are normal. CTA NECK FINDINGS SKELETON: There is no bony spinal canal stenosis. No lytic or blastic lesion. OTHER NECK: Large amount of subcutaneous gas in the visualized upper chest and the neck. UPPER CHEST: Large apical bullae.  Emphysema. AORTIC ARCH: There is calcific atherosclerosis of the aortic arch. There is no aneurysm, dissection or hemodynamically significant stenosis of the visualized portion of  the aorta. Conventional 3 vessel aortic branching pattern. The visualized proximal subclavian arteries are widely patent. RIGHT CAROTID SYSTEM: No dissection, occlusion or aneurysm. Mild atherosclerotic calcification at the carotid bifurcation without hemodynamically significant stenosis. LEFT CAROTID SYSTEM: No dissection, occlusion or aneurysm. Mild atherosclerotic  calcification at the carotid bifurcation without hemodynamically significant stenosis. VERTEBRAL ARTERIES: Codominant configuration. Calcification at the origin of the right vertebral artery with moderate narrowing. Right vertebral artery is otherwise normal. The left vertebral artery is normal along its entire course. CTA HEAD FINDINGS POSTERIOR CIRCULATION: --Vertebral arteries: Normal V4 segments. --Inferior cerebellar arteries: Normal. --Basilar artery: Normal. --Superior cerebellar arteries: Normal. --Posterior cerebral arteries (PCA): Left PCA is occluded at the P1 segment. Right PCA is normal. ANTERIOR CIRCULATION: --Intracranial internal carotid arteries: Normal. --Anterior cerebral arteries (ACA): Normal. Both A1 segments are present. Patent anterior communicating artery (a-comm). --Middle cerebral arteries (MCA): Normal. VENOUS SINUSES: As permitted by contrast timing, patent. ANATOMIC VARIANTS: None Review of the MIP images confirms the above findings. IMPRESSION: 1. 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 2. Petechial hemorrhage in the right frontal lobe at the area abnormality described on the earlier MRI. No mass effect. 3. Large amount of subcutaneous gas in the visualized upper chest and neck. This may be secondary to a ruptured apical bulla. Aortic Atherosclerosis (ICD10-I70.0) and Emphysema (ICD10-J43.9). Electronically Signed   By: Ulyses Jarred M.D.   On: 06/01/2021 19:25   DG Neck Soft Tissue  Result Date: 06/02/2021 CLINICAL DATA:  Subcutaneous gas in the visualized upper chest and neck seen on a CT angiogram of the neck yesterday. EXAM: NECK SOFT TISSUES - 1+ VIEW COMPARISON:  CT angiogram of the neck June 01, 2021 FINDINGS: A bulla is seen in the lateral left apex. No pneumothorax identified. A bulla is also seen in the right apex. No other abnormalities in the upper chest.  Subcutaneous air in the bilateral neck, right greater than left, persists. Overall, the amount of air is stable to mildly improved. No other abnormalities identified. IMPRESSION: Stable to mildly improved subcutaneous air in the base of the neck bilaterally. Electronically Signed   By: Dorise Bullion III M.D.   On: 06/02/2021 17:04   DG Chest 2 View  Result Date: 06/02/2021 CLINICAL DATA:  Subcutaneous emphysema in the upper chest/neck, as seen on prior CTA neck. EXAM: CHEST - 2 VIEW COMPARISON:  CTA neck dated June 01, 2021 FINDINGS: The heart size and mediastinal contours are within normal limits. Hyperinflated lungs with emphysematous changes of the lung apices. No focal consolidation or pleural effusion. No appreciable pneumothorax. Subcutaneous emphysema with multiple small foci of gas at the base of the neck and possibly in the right chest wall. The visualized skeletal structures are unremarkable. IMPRESSION: 1.  No acute cardiopulmonary process. 2. Emphysematous changes. No evidence of pneumothorax. No focal consolidation. 3. Subcutaneous emphysema about base of the neck and likely in the right chest wall. Electronically Signed   By: Keane Police D.O.   On: 06/02/2021 17:06   CT Angio Neck W and/or Wo Contrast  Result Date: 06/01/2021 CLINICAL DATA:  Stroke follow-up EXAM: CT ANGIOGRAPHY HEAD AND NECK TECHNIQUE: Multidetector CT imaging of the head and neck was performed using the standard protocol during bolus administration of intravenous contrast. Multiplanar CT image reconstructions and MIPs were obtained to evaluate the vascular anatomy. Carotid stenosis measurements (when applicable) are obtained utilizing  NASCET criteria, using the distal internal carotid diameter as the denominator. CONTRAST:  58mL OMNIPAQUE IOHEXOL 350 MG/ML SOLN COMPARISON:  Brain MRI 06/01/2021 FINDINGS: CT HEAD FINDINGS Brain: Petechial hemorrhage in the right frontal lobe at the area abnormality described on the earlier  MRI. No mass effect. There is generalized atrophy without lobar predilection. There is hypoattenuation of the periventricular white matter, most commonly indicating chronic ischemic microangiopathy. Skull: The visualized skull base, calvarium and extracranial soft tissues are normal. Sinuses/Orbits: No fluid levels or advanced mucosal thickening of the visualized paranasal sinuses. No mastoid or middle ear effusion. The orbits are normal. CTA NECK FINDINGS SKELETON: There is no bony spinal canal stenosis. No lytic or blastic lesion. OTHER NECK: Large amount of subcutaneous gas in the visualized upper chest and the neck. UPPER CHEST: Large apical bullae.  Emphysema. AORTIC ARCH: There is calcific atherosclerosis of the aortic arch. There is no aneurysm, dissection or hemodynamically significant stenosis of the visualized portion of the aorta. Conventional 3 vessel aortic branching pattern. The visualized proximal subclavian arteries are widely patent. RIGHT CAROTID SYSTEM: No dissection, occlusion or aneurysm. Mild atherosclerotic calcification at the carotid bifurcation without hemodynamically significant stenosis. LEFT CAROTID SYSTEM: No dissection, occlusion or aneurysm. Mild atherosclerotic calcification at the carotid bifurcation without hemodynamically significant stenosis. VERTEBRAL ARTERIES: Codominant configuration. Calcification at the origin of the right vertebral artery with moderate narrowing. Right vertebral artery is otherwise normal. The left vertebral artery is normal along its entire course. CTA HEAD FINDINGS POSTERIOR CIRCULATION: --Vertebral arteries: Normal V4 segments. --Inferior cerebellar arteries: Normal. --Basilar artery: Normal. --Superior cerebellar arteries: Normal. --Posterior cerebral arteries (PCA): Left PCA is occluded at the P1 segment. Right PCA is normal. ANTERIOR CIRCULATION: --Intracranial internal carotid arteries: Normal. --Anterior cerebral arteries (ACA): Normal. Both A1  segments are present. Patent anterior communicating artery (a-comm). --Middle cerebral arteries (MCA): Normal. VENOUS SINUSES: As permitted by contrast timing, patent. ANATOMIC VARIANTS: None Review of the MIP images confirms the above findings. IMPRESSION: 1. 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 2. Petechial hemorrhage in the right frontal lobe at the area abnormality described on the earlier MRI. No mass effect. 3. Large amount of subcutaneous gas in the visualized upper chest and neck. This may be secondary to a ruptured apical bulla. Aortic Atherosclerosis (ICD10-I70.0) and Emphysema (ICD10-J43.9). Electronically Signed   By: Ulyses Jarred M.D.   On: 06/01/2021 19:25   MR BRAIN W WO CONTRAST  Result Date: 06/01/2021 CLINICAL DATA:  Left facial droop, confusion, history of renal mass EXAM: MRI HEAD WITHOUT AND WITH CONTRAST TECHNIQUE: Multiplanar, multiecho pulse sequences of the brain and surrounding structures were obtained without and with intravenous contrast. CONTRAST:  7.21mL GADAVIST GADOBUTROL 1 MMOL/ML IV SOLN COMPARISON:  None. FINDINGS: Brain: There is heterogeneous cortical/subcortical diffusion signal in the right frontal lobe with involvement of the lateral precentral gyrus. There is corresponding susceptibility. Curvilinear enhancement is present. There is a possible developmental venous anomaly in this region. No nodular enhancement. Prominence of the ventricles and sulci reflects parenchymal volume loss. Additional patchy T2 hyperintensity in the supratentorial white matter is nonspecific but may reflect mild chronic microvascular ischemic changes. There is no additional abnormal enhancement. Vascular: Major vessel flow voids at the skull base are preserved. Skull and upper cervical spine: Normal marrow signal is preserved. Sinuses/Orbits: Paranasal sinuses are aerated. Bilateral  lens replacements. Other: Sella is unremarkable. Minimal patchy  right mastoid fluid opacification. IMPRESSION: Acute hemorrhage in the right frontal lobe involving the lateral precentral gyrus. May reflect hemorrhagic conversion of a subacute ischemic infarct given timing of symptoms. No nodular enhancement to suggest underlying metastatic lesion. There is a possible developmental venous anomaly in this region and therefore hemorrhage secondary to a cavernous malformation is also a differential consideration. These results were called by telephone at the time of interpretation on 06/01/2021 at 1:18 pm to provider [facility] , who verbally acknowledged these results. Electronically Signed   By: Macy Mis M.D.   On: 06/01/2021 13:19   US RENAL  Result Date: 06/01/2021 CLINICAL DATA:  Acute kidney injury EXAM: RENAL / URINARY TRACT ULTRASOUND COMPLETE COMPARISON:  CT 03/21/2021.  Ultrasound 07/11/2020 FINDINGS: Right Kidney: Renal measurements: 11.9 x 6.2 x 5.8 cm = volume: 221 mL. Normal echotexture. 1.9 cm cyst in the lower pole. 3 cm complex hypoechoic area within the midpole compared with 3 cm on prior CT. Left Kidney: Renal measurements: 12.8 x 6.5 x 6.0 cm = volume: 259 mL. 16 mm calcification in the upper pole compatible with nonobstructing stone. Multiple cysts measuring up to 2.8 cm. Normal echotexture. No hydronephrosis. Bladder: Appears normal for degree of bladder distention. Other: Incidentally noted gallstone within the gallbladder. IMPRESSION: Bilateral renal cysts. Complex hypoechoic area within the midpole of the right kidney measures 3 cm and is stable in size when compared to prior CT. This could be further characterized with elective renal protocol MRI if felt clinically indicated. No hydronephrosis. Cholelithiasis. Electronically Signed   By: Rolm Baptise M.D.   On: 06/01/2021 23:22   ECHOCARDIOGRAM COMPLETE  Result Date: 06/02/2021    ECHOCARDIOGRAM REPORT   Patient Name:   DONTAE MINERVA Date of Exam: 06/02/2021 Medical Rec #:  [id number]          Height:       71.0 in Accession #:    [id number]         Weight:       160.0 lb Date of Birth:  1956/01/15          BSA:          1.918 m Patient Age:    66 years           BP:           146/75 mmHg Patient Gender: M                  HR:           74 bpm. Exam Location:  Inpatient Procedure: 2D Echo, Cardiac Doppler and Color Doppler Indications:    Stroke  History:        Patient has prior history of Echocardiogram examinations, most                 recent 03/29/2021. Stroke, Arrythmias:Atrial Fibrillation; Risk                 Factors:Hypertension, Dyslipidemia and Diabetes.  Sonographer:    Johny Chess RDCS Referring Phys: [id number] [place]  1. Left ventricular ejection fraction, by estimation, is 60 to 65%. The left ventricle has normal function. The left ventricle has no regional wall motion abnormalities. Left ventricular diastolic parameters are consistent with Grade I diastolic dysfunction (impaired relaxation).  2. Right ventricular systolic function is normal. The right ventricular size is mildly enlarged. There is normal pulmonary artery systolic pressure. The estimated right ventricular systolic pressure is 31.6  mmHg.  3. Left atrial size was severely dilated.  4. Right atrial size was moderately dilated.  5. The mitral valve is normal in structure. Mild mitral valve regurgitation. No evidence of mitral stenosis.  6. The aortic valve is normal in structure. Aortic valve regurgitation is not visualized. No aortic stenosis is present.  7. The inferior vena cava is dilated in size with >50% respiratory variability, suggesting right atrial pressure of 8 mmHg. Conclusion(s)/Recommendation(s): No intracardiac source of embolism detected on this transthoracic study. Consider a transesophageal echocardiogram to exclude cardiac source of embolism if clinically indicated. FINDINGS  Left Ventricle: Left ventricular ejection  fraction, by estimation, is 60 to 65%. The left ventricle has normal function. The left ventricle has no regional wall motion abnormalities. The left ventricular internal cavity size was normal in size. There is  no left ventricular hypertrophy. Left ventricular diastolic parameters are consistent with Grade I diastolic dysfunction (impaired relaxation). Right Ventricle: The right ventricular size is mildly enlarged. No increase in right ventricular wall thickness. Right ventricular systolic function is normal. There is normal pulmonary artery systolic pressure. The tricuspid regurgitant velocity is 2.43  m/s, and with an assumed right atrial pressure of 8 mmHg, the estimated right ventricular systolic pressure is 17.6 mmHg. Left Atrium: Left atrial size was severely dilated. Right Atrium: Right atrial size was moderately dilated. Pericardium: There is no evidence of pericardial effusion. Mitral Valve: The mitral valve is normal in structure. Mild mitral valve regurgitation. No evidence of mitral valve stenosis. Tricuspid Valve: The tricuspid valve is normal in structure. Tricuspid valve regurgitation is mild . No evidence of tricuspid stenosis. Aortic Valve: The aortic valve is normal in structure. Aortic valve regurgitation is not visualized. No aortic stenosis is present. Aortic valve mean gradient measures 2.0 mmHg. Aortic valve peak gradient measures 3.6 mmHg. Aortic valve area, by VTI measures 2.72 cm. Pulmonic Valve: The pulmonic valve was normal in structure. Pulmonic valve regurgitation is not visualized. No evidence of pulmonic stenosis. Aorta: The aortic root is normal in size and structure. Venous: The inferior vena cava is dilated in size with greater than 50% respiratory variability, suggesting right atrial pressure of 8 mmHg. IAS/Shunts: No atrial level shunt detected by color flow Doppler.  LEFT VENTRICLE PLAX 2D LVOT diam:     2.40 cm LV SV:         57 LV SV Index:   30 LVOT Area:     4.52 cm   RIGHT VENTRICLE             IVC RV Basal diam:  4.00 cm     IVC diam: 2.30 cm RV S prime:     11.15 cm/s LEFT ATRIUM              Index        RIGHT ATRIUM           Index LA Vol (A2C):   122.0 ml 63.62 ml/m  RA Area:     25.00 cm LA Vol (A4C):   79.1 ml  41.25 ml/m  RA Volume:   81.60 ml  42.55 ml/m LA Biplane Vol: 99.1 ml  51.67 ml/m  AORTIC VALVE                    PULMONIC VALVE AV Area (Vmax):    3.04 cm     PV Vmax:       0.81 m/s AV Area (Vmean):   2.90 cm  PV Peak grad:  2.6 mmHg AV Area (VTI):     2.72 cm AV Vmax:           94.23 cm/s AV Vmean:          69.300 cm/s AV VTI:            0.209 m AV Peak Grad:      3.6 mmHg AV Mean Grad:      2.0 mmHg LVOT Vmax:         63.33 cm/s LVOT Vmean:        44.433 cm/s LVOT VTI:          0.126 m LVOT/AV VTI ratio: 0.60  AORTA Ao Root diam: 3.70 cm TRICUSPID VALVE TR Peak grad:   23.6 mmHg TR Vmax:        243.00 cm/s  SHUNTS Systemic VTI:  0.13 m Systemic Diam: 2.40 cm Candee Furbish MD Electronically signed by Candee Furbish MD Signature Date/Time: 06/02/2021/2:15:55 PM    Final       Subjective: Feels as though speech is improving.  No new complaints.  Discharge Exam: Vitals:   06/03/21 0017 06/03/21 0432 06/03/21 0731 06/03/21 1214  BP: 118/76 130/82 138/81 (!) 141/90  Pulse: 61 (!) 56 70 96  Resp: 16 16 18  (!) 24  Temp: 98 F (36.7 C) (!) 97.5 F (36.4 C) 97.8 F (36.6 C) 99.3 F (37.4 C)  TempSrc: Oral Oral  Oral  SpO2: 100% 100% 99% 100%  Weight:      Height:        General: Pt is alert, awake, not in acute distress Cardiovascular: RRR, S1/S2 +, no rubs, no gallops Respiratory: CTA bilaterally, no wheezing, no rhonchi Abdominal: Soft, NT, ND, bowel sounds + Extremities: no edema, no cyanosis    The results of significant diagnostics from this hospitalization (including imaging, microbiology, ancillary and laboratory) are listed below for reference.     Microbiology: Recent Results (from the past 240 hour(s))  Resp Panel by  RT-PCR (Flu A&B, Covid) Nasopharyngeal Swab     Status: None   Collection Time: 06/01/21  4:10 PM   Specimen: Nasopharyngeal Swab; Nasopharyngeal(NP) swabs in vial transport medium  Result Value Ref Range Status   SARS Coronavirus 2 by RT PCR NEGATIVE NEGATIVE Final    Comment: (NOTE) SARS-CoV-2 target nucleic acids are NOT DETECTED.  The SARS-CoV-2 RNA is generally detectable in upper respiratory specimens during the acute phase of infection. The lowest concentration of SARS-CoV-2 viral copies this assay can detect is 138 copies/mL. A negative result does not preclude SARS-Cov-2 infection and should not be used as the sole basis for treatment or other patient management decisions. A negative result may occur with  improper specimen collection/handling, submission of specimen other than nasopharyngeal swab, presence of viral mutation(s) within the areas targeted by this assay, and inadequate number of viral copies(<138 copies/mL). A negative result must be combined with clinical observations, patient history, and epidemiological information. The expected result is Negative.  Fact Sheet for Patients:  EntrepreneurPulse.com.au  Fact Sheet for Healthcare Providers:  IncredibleEmployment.be  This test is no t yet approved or cleared by the [place] FDA and  has been authorized for detection and/or diagnosis of SARS-CoV-2 by FDA under an Emergency Use Authorization (EUA). This EUA will remain  in effect (meaning this test can be used) for the duration of the COVID-19 declaration under Section 564(b)(1) of the Act, 21 U.S.C.section 360bbb-3(b)(1), unless the authorization is terminated  or revoked  sooner.       Influenza A by PCR NEGATIVE NEGATIVE Final   Influenza B by PCR NEGATIVE NEGATIVE Final    Comment: (NOTE) The Xpert Xpress SARS-CoV-2/FLU/RSV plus assay is intended as an aid in the diagnosis of influenza from Nasopharyngeal swab  specimens and should not be used as a sole basis for treatment. Nasal washings and aspirates are unacceptable for Xpert Xpress SARS-CoV-2/FLU/RSV testing.  Fact Sheet for Patients: EntrepreneurPulse.com.au  Fact Sheet for Healthcare Providers: IncredibleEmployment.be  This test is not yet approved or cleared by the [place] FDA and has been authorized for detection and/or diagnosis of SARS-CoV-2 by FDA under an Emergency Use Authorization (EUA). This EUA will remain in effect (meaning this test can be used) for the duration of the COVID-19 declaration under Section 564(b)(1) of the Act, 21 U.S.C. section 360bbb-3(b)(1), unless the authorization is terminated or revoked.  Performed at [facility], [place] [address] [place], [place] [address]      Labs: BNP (last 3 results) No results for input(s): BNP in the last 8760 hours. Basic Metabolic Panel: Recent Labs  Lab 06/01/21 1609 06/02/21 0500  NA 130* 131*  K 5.1 4.4  CL 91* 92*  CO2 28 28  GLUCOSE 319* 172*  BUN 25* 20  CREATININE 1.74* 1.09  CALCIUM 10.6* 10.0   Liver Function Tests: Recent Labs  Lab 06/01/21 1609  AST 32  ALT 16  ALKPHOS 43  BILITOT 0.8  PROT 7.3  ALBUMIN 4.2   No results for input(s): LIPASE, AMYLASE in the last 168 hours. No results for input(s): AMMONIA in the last 168 hours. CBC: Recent Labs  Lab 06/01/21 1609 06/02/21 0500  WBC 8.0 7.2  NEUTROABS 4.3  --   HGB 13.9 12.9*  HCT 40.9 38.5*  MCV 99.3 98.2  PLT 259 265   Cardiac Enzymes: No results for input(s): CKTOTAL, CKMB, CKMBINDEX, TROPONINI in the last 168 hours. BNP: Invalid input(s): POCBNP CBG: Recent Labs  Lab 06/02/21 0004 06/02/21 0806 06/02/21 1200 06/02/21 1712 06/03/21 1211  GLUCAP 197* 174* 172* 194* 212*   D-Dimer No results for input(s): DDIMER in the last 72 hours. Hgb A1c Recent Labs    06/03/21 0536  HGBA1C 6.2*   Lipid  Profile Recent Labs    06/03/21 0536  CHOL 115  HDL 32*  LDLCALC 62  TRIG 103  CHOLHDL 3.6   Thyroid function studies No results for input(s): TSH, T4TOTAL, T3FREE, THYROIDAB in the last 72 hours.  Invalid input(s): FREET3 Anemia work up No results for input(s): VITAMINB12, FOLATE, FERRITIN, TIBC, IRON, RETICCTPCT in the last 72 hours. Urinalysis    Component Value Date/Time   COLORURINE YELLOW 06/01/2021 2143   APPEARANCEUR CLEAR 06/01/2021 2143   LABSPEC 1.023 06/01/2021 2143   PHURINE 8.0 06/01/2021 2143   GLUCOSEU 50 (A) 06/01/2021 2143   HGBUR MODERATE (A) 06/01/2021 2143   BILIRUBINUR NEGATIVE 06/01/2021 2143   KETONESUR NEGATIVE 06/01/2021 2143   PROTEINUR 100 (A) 06/01/2021 2143   UROBILINOGEN 0.2 12/19/2014 1132   NITRITE NEGATIVE 06/01/2021 2143   LEUKOCYTESUR NEGATIVE 06/01/2021 2143   Sepsis Labs Invalid input(s): PROCALCITONIN,  WBC,  LACTICIDVEN Microbiology Recent Results (from the past 240 hour(s))  Resp Panel by RT-PCR (Flu A&B, Covid) Nasopharyngeal Swab     Status: None   Collection Time: 06/01/21  4:10 PM   Specimen: Nasopharyngeal Swab; Nasopharyngeal(NP) swabs in vial transport medium  Result Value Ref Range Status   SARS Coronavirus 2 by RT  PCR NEGATIVE NEGATIVE Final    Comment: (NOTE) SARS-CoV-2 target nucleic acids are NOT DETECTED.  The SARS-CoV-2 RNA is generally detectable in upper respiratory specimens during the acute phase of infection. The lowest concentration of SARS-CoV-2 viral copies this assay can detect is 138 copies/mL. A negative result does not preclude SARS-Cov-2 infection and should not be used as the sole basis for treatment or other patient management decisions. A negative result may occur with  improper specimen collection/handling, submission of specimen other than nasopharyngeal swab, presence of viral mutation(s) within the areas targeted by this assay, and inadequate number of viral copies(<138 copies/mL). A  negative result must be combined with clinical observations, patient history, and epidemiological information. The expected result is Negative.  Fact Sheet for Patients:  EntrepreneurPulse.com.au  Fact Sheet for Healthcare Providers:  IncredibleEmployment.be  This test is no t yet approved or cleared by the [place] FDA and  has been authorized for detection and/or diagnosis of SARS-CoV-2 by FDA under an Emergency Use Authorization (EUA). This EUA will remain  in effect (meaning this test can be used) for the duration of the COVID-19 declaration under Section 564(b)(1) of the Act, 21 U.S.C.section 360bbb-3(b)(1), unless the authorization is terminated  or revoked sooner.       Influenza A by PCR NEGATIVE NEGATIVE Final   Influenza B by PCR NEGATIVE NEGATIVE Final    Comment: (NOTE) The Xpert Xpress SARS-CoV-2/FLU/RSV plus assay is intended as an aid in the diagnosis of influenza from Nasopharyngeal swab specimens and should not be used as a sole basis for treatment. Nasal washings and aspirates are unacceptable for Xpert Xpress SARS-CoV-2/FLU/RSV testing.  Fact Sheet for Patients: EntrepreneurPulse.com.au  Fact Sheet for Healthcare Providers: IncredibleEmployment.be  This test is not yet approved or cleared by the [place] FDA and has been authorized for detection and/or diagnosis of SARS-CoV-2 by FDA under an Emergency Use Authorization (EUA). This EUA will remain in effect (meaning this test can be used) for the duration of the COVID-19 declaration under Section 564(b)(1) of the Act, 21 U.S.C. section 360bbb-3(b)(1), unless the authorization is terminated or revoked.  Performed at [facility], [place] [address], [place], [place] [address]      Time coordinating discharge: 88mins  SIGNED:   Kathie Dike, MD  [facility] 06/03/2021, 10:04 PM   If 7PM-7AM,  please contact night-coverage [email or web address]

## 2021-06-03 NOTE — TOC Transition Note (Signed)
Transition of Care [facility]) - CM/SW Discharge Note   Patient Details  Name: Angel Shelton MRN: [id number] Date of Birth: [birth year]  [facility] CM/SW Contact:  Jermiya Reichl, Marta Lamas, LCSW Phone Number: 06/03/2021, 10:56 AM   Clinical Narrative:     Home w/ Self Care.  Recommendations to follow-up with Outpatient or Home Health Speech Therapist, but patient is refusing.  Patient denies any social work needs at present.  CSW will sign off.    Final next level of care: Home/Self Care Barriers to Discharge: No Barriers Identified   Patient Goals and CMS Choice Patient states their goals for this hospitalization and ongoing recovery are:: Home with Self Care CMS Medicare.gov Compare Post Acute Care list provided to:: Patient Choice offered to / list presented to : NA  Discharge Placement               N/A        Discharge Plan and Services   Home with Self-Care.             DME Arranged: N/A DME Agency: NA       HH Arranged: NA          Social Determinants of Health (SDOH) Interventions     Readmission Risk Interventions No flowsheet data found.

## 2021-06-03 NOTE — Plan of Care (Signed)
°  Problem: Education: Goal: Knowledge of disease or condition will improve 06/03/2021 1220 by Tana Conch, RN Outcome: Progressing 06/03/2021 1220 by Tana Conch, RN Outcome: Progressing   Problem: Education: Goal: Knowledge of disease or condition will improve Outcome: Progressing

## 2021-06-03 NOTE — Plan of Care (Signed)
Pt will remain free from falls and injury throughout shift

## 2021-06-03 NOTE — Progress Notes (Signed)
Discharge instructions and AVS reviewed w/ pt. Pt. Verbalized understanding and had no further questions.

## 2021-06-04 LAB — GLUCOSE, CAPILLARY
Glucose-Capillary: 195 mg/dL — ABNORMAL HIGH (ref 70–99)
Glucose-Capillary: 149 mg/dL — ABNORMAL HIGH (ref 70–99)

## 2021-06-07 ENCOUNTER — Encounter: Payer: Self-pay | Admitting: Speech Pathology

## 2021-06-07 ENCOUNTER — Other Ambulatory Visit: Payer: Self-pay

## 2021-06-07 ENCOUNTER — Ambulatory Visit: Payer: Medicare Other | Attending: Internal Medicine | Admitting: Speech Pathology

## 2021-06-07 DIAGNOSIS — R471 Dysarthria and anarthria: Secondary | ICD-10-CM | POA: Diagnosis not present

## 2021-06-07 NOTE — Therapy (Signed)
[place] [address] [place], [place], [address] Phone: [phone]   Fax:  [phone]  Speech Language Pathology Evaluation  Patient Details  Name: Angel Shelton MRN: [id number] Date of Birth: 1955-06-14 Referring Provider (SLP): Dr. Kathie Dike (hospitalist) Dr. Leonie Man (new pt to be seen 07/30/21)   Encounter Date: 06/07/2021   End of Session - 06/07/21 1516     Visit Number 1    Number of Visits 17    Date for SLP Re-Evaluation 08/03/21    Authorization Type BCBS medicare    SLP Start Time 1323    SLP Stop Time  1400    SLP Time Calculation (min) 37 min    Activity Tolerance Patient tolerated treatment well             Past Medical History:  Diagnosis Date   Alcohol abuse    Diabetes mellitus without complication ([place])    Diverticulosis    GERD (gastroesophageal reflux disease)    Hyperlipidemia    Hypertension    Left atrial enlargement    Neuromuscular disorder (HCC)    neuropathy   Persistent atrial fibrillation (HCC)    CHA2DS2-VASc score 4 (age-57, HTN-1, DM 2-1, aortic plaque)    Past Surgical History:  Procedure Laterality Date   APPENDECTOMY     CARDIAC CATHETERIZATION N/A 01/05/2015   Procedure: Left Heart Cath and Coronary Angiography;  Surgeon: Pixie Casino, MD;  Location: [facility];  Service: Cardiovascular;; (For Abnormal Nuclear Stress Test) mild luminal irregularities of the LCx with mild areas of ectasia.  Calcified ostial D1 mild stenosis. ->  Mild/nonobstructive CAD.   CARDIOVERSION N/A 11/01/2014   Procedure: CARDIOVERSION;  Surgeon: Pixie Casino, MD;  Location: [facility] ENDOSCOPY;  Service: Cardiovascular;  Laterality: N/A;   CARDIOVERSION N/A 02/24/2015   Procedure: CARDIOVERSION;  Surgeon: Pixie Casino, MD;  Location: [facility] ENDOSCOPY;  Service: Cardiovascular;  Laterality: N/A;   CATARACT EXTRACTION Bilateral    COLON SURGERY N/A 2006   14 inches removed  from diverticitis perforation   COLONOSCOPY     Per stoma   EYE SURGERY Bilateral    Cat Sx   INNER EAR SURGERY     as a child   NM MYOVIEW LTD  12/08/2014   FALSE POSITIVE: Read asINTERMEDIATE RISK.  Partially reversible medium size moderate severity defect in the basal--mid-apical inferoseptal-inferior and apical walls.  Not gated due to A. fib. ->  Referred for cath- NON-OBSTRUCTIVE CAD   ROBOTIC ASSITED PARTIAL NEPHRECTOMY Right 05/23/2021   Procedure: XI RETROPERITONEAL ROBOTIC ASSITED PARTIAL NEPHRECTOMY;  Surgeon: Alexis Frock, MD;  Location: [facility];  Service: Urology;  Laterality: Right;   TEE WITHOUT CARDIOVERSION N/A 11/01/2014   Procedure: TRANSESOPHAGEAL ECHOCARDIOGRAM (TEE) -unsuccesful DCCV;  Surgeon: Pixie Casino, MD;  Location: [facility];  Service: Cardiovascular;; Mild concentric LVH.  EF 55 to 60%.  Normal wall motion.  Grade 3 aortic atheroma.  No source of cardiac emboli.  -->  Followed by unsuccessful DCCV.    There were no vitals filed for this visit.   Subjective Assessment - 06/07/21 1326     Subjective "I am drooling"    Currently in Pain? No/denies                SLP Evaluation [facility] - 06/07/21 1326       SLP Visit Information   SLP Received On 06/07/21    Referring Provider (SLP) Dr. Kathie Dike (hospitalist) Dr. Leonie Man (  new pt to be seen 07/30/21)    Onset Date 06/01/21    Medical Diagnosis CVA      Subjective   Patient/Family Stated Goal "to get my ability to speak and communicate back"      General Information   HPI Angel Shelton is a 66 y.o. male with medical history significant for persistent atrial fibrillation on Eliquis (last dose 05/20/2021), T2DM, HTN, HLD, CKD stage IIIa, right renal neoplasm s/p robotic assisted partial nephrectomy 05/23/2021 who presented to the ED for evaluation of stroke. Imaging revealed R frontal hemorrhage.    Mobility Status walks independently      Balance Screen   Has the patient fallen in the  past 6 months No    Has the patient had a decrease in activity level because of a fear of falling?  No    Is the patient reluctant to leave their home because of a fear of falling?  No      Prior Functional Status   Cognitive/Linguistic Baseline Within functional limits    Type of Home House     Lives With Alone    Available Support Friend(s)    Vocation Full time employment      Cognition   Overall Cognitive Status Within Functional Limits for tasks assessed      Auditory Comprehension   Overall Auditory Comprehension Appears within functional limits for tasks assessed      Verbal Expression   Overall Verbal Expression Appears within functional limits for tasks assessed      Written Expression   Dominant Hand Right      Oral Motor/Sensory Function   Overall Oral Motor/Sensory Function Impaired    Labial ROM Reduced left    Labial Symmetry Abnormal symmetry left    Labial Strength Reduced Left    Labial Sensation Reduced Left    Labial Coordination Reduced    Lingual ROM Within Functional Limits    Lingual Symmetry Within Functional Limits    Lingual Strength Within Functional Limits    Lingual Sensation Within Functional Limits    Lingual Coordination WFL    Facial ROM Within Functional Limits    Velum Within Functional Limits      Motor Speech   Overall Motor Speech Impaired    Respiration Within functional limits    Phonation Normal    Resonance Within functional limits    Articulation Impaired    Level of Impairment Conversation    Intelligibility Intelligibility reduced    Word 75-100% accurate    Sentence 75-100% accurate    Conversation 75-100% accurate    Motor Planning Witnin functional limits    Motor Speech Errors Not applicable    Effective Techniques Slow rate;Over-articulate;Increased vocal intensity;Pause                             SLP Education - 06/07/21 1515     Education Details HEP for speech    Person(s) Educated  Patient    Methods Explanation;Demonstration;Verbal cues;Handout    Comprehension Verbalized understanding;Returned demonstration;Verbal cues required;Need further instruction                SLP Long Term Goals - 06/07/21 1524       SLP LONG TERM GOAL #1   Title Pt will complete HEP for dysarthria with mod I    Time 6    Period Weeks    Status New  SLP LONG TERM GOAL #2   Title Pt will be 100% intelligible mildly complex conversation with mod I    Time 6    Period Weeks    Status New      SLP LONG TERM GOAL #3   Title Pt will be 100% intelligible over 15 minute conversation in noisy environment    Time 6    Period Weeks    Status New      SLP LONG TERM GOAL #4   Title Pt will report 3 or less requests for repetition on phone calls over 1 week    Time 6    Period Weeks    Status New      SLP LONG TERM GOAL #5   Title Pt will improve score on Communicative Effectiveness Surgey by 2 points    Time 6    Period Weeks    Status New      Additional Long Term Goals   Additional Long Term Goals Yes      SLP LONG TERM GOAL #6   Title Pt will report 50% reduction in drool subjectively    Time 6    Period Weeks    Status New              Plan - 06/07/21 1517     Clinical Impression Statement Angel Shelton) is referred for outpt ST by hospitalist s/p Right frontal CVA 06/01/21 due to speech difficulties. 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  He reports slurred speech which affects his intelligiblity and states people ask him to repeat himself. He also reports drool and left buccal resiude after meals, which he senses and clears. Today he presents with mild flaccid dysarthria and is 100% intelligible in this quiet room. The Communicatve Effectiveness Survey revealed a score of 23, revealing Angel Shelton is having the most difficulty speaking over the phone. Speech is characterized by slur with imprecise consonants. I  trained pt in [place] and initiated HEP for speech. and strategies to reduce drool. I recommend short course of ST to maximize intelligibility for possible return to work. Will assess cognition as indicated. Angel Shelton also reported difficulty buttoning, zipping, typing and texting due UE clumsiness. I requessted OT eval from PCP as he has not established care with Dr. Leonie Man yet.    Speech Therapy Frequency 2x / week    Duration --   6 weeks   Treatment/Interventions Aspiration precaution training;Environmental controls;SLP instruction and feedback;Oral motor exercises;Cueing hierarchy;Compensatory strategies;Functional tasks;Cognitive reorganization;Patient/family education;Multimodal communcation approach;Internal/external aids;Diet toleration management by SLP    Potential to Achieve Goals Good             Patient will benefit from skilled therapeutic intervention in order to improve the following deficits and impairments:   Dysarthria and anarthria    Problem List Patient Active Problem List   Diagnosis Date Noted   Stroke ([place]) 06/02/2021   Ischemic stroke of frontal lobe ([place]) 06/01/2021   Acute kidney injury superimposed on CKD ([place]) 06/01/2021   Renal mass 05/23/2021   Aortic atherosclerosis ([place]) 04/10/2021   Preop cardiovascular exam 02/09/2021   Permanent atrial fibrillation ([place]) -CHA2DS2-VASc score 4 11/24/2014   Encounter for cardioversion procedure    DM2 (diabetes mellitus, type 2) ([place]) 09/29/2014   Essential hypertension 09/29/2014   Dyslipidemia associated with type 2 diabetes mellitus ([place]) 09/29/2014    Itxel Wickard, Annye Rusk, [place] 06/07/2021, 3:29 PM  [facility]  [address] [place], [place], [address] Phone: [phone]   Fax:  [phone]  Name: Angel Shelton MRN: [id number] Date of Birth: 03-21-1956

## 2021-06-07 NOTE — Patient Instructions (Addendum)
° ° ° °  SLOW LOUD OVER-ENNUNCIATE PAUSE   BUTTERCUP  CATERPILLAR  BASEBALLL PLAYER  [place]  [place]  SLOW AND BIG - EXAGGERATE YOUR MOUTH, MAKE EACH CONSONANT  Speech exercises - do 5x each, x2-3/day SLOW BIG  SAY THE FOLLOWING- make every sound! Red leather, yellow leather     Purple baby carriage    [place] [employer] copper coffee pot Ripe purple cabbage Three free throws [place] [employer], Blue Bulb Flash Message Six Thick Thistles Stick Double Bubble Gum Cinnamon aluminum linoleum Black bugs blood Lovely lemon linament Tying Tape Takes Time A Shifty Salt Shaker   Four floors to cover Unique [place] A Three Toed Tree Toad Knapsack Strap Snap Rubber Baby Buggy Bumpers [place]  Seven Salty Sailors Sailed the [place] Which Wrist Watches are [place] Wrist Watches  Drool means you are not swallowing frequently enough. Be mindful to swallow regularly  Wear a loose rubber band around your wrist - when you look at it, swallow  You need to talk slower than you think you do - you are really fast   In the mirorr do these 10x each twice a day  OOO-EEEE  AAA-OOO  Pucker big - eyes open  Fill cheeks with air and swish

## 2021-06-12 DIAGNOSIS — C641 Malignant neoplasm of right kidney, except renal pelvis: Secondary | ICD-10-CM | POA: Diagnosis not present

## 2021-06-13 ENCOUNTER — Encounter: Payer: Self-pay | Admitting: Speech Pathology

## 2021-06-13 ENCOUNTER — Other Ambulatory Visit: Payer: Self-pay

## 2021-06-13 ENCOUNTER — Ambulatory Visit: Payer: Medicare Other | Admitting: Speech Pathology

## 2021-06-13 DIAGNOSIS — R471 Dysarthria and anarthria: Secondary | ICD-10-CM | POA: Diagnosis not present

## 2021-06-13 NOTE — Therapy (Signed)
[facility] [address] [place], [place], [address] Phone: [phone]   Fax:  [phone]  Speech Language Pathology Treatment  Patient Details  Name: Angel Shelton MRN: [id number] Date of Birth: Aug 28, 1955 Referring Provider (SLP): Dr. Kathie Dike (hospitalist) Dr. Leonie Man (new pt to be seen 07/30/21)   Encounter Date: 06/13/2021   End of Session - 06/13/21 1015     Visit Number 2    Number of Visits 17    Date for SLP Re-Evaluation 08/03/21    Authorization Type BCBS medicare    SLP Start Time 0940   pt arrived late   SLP Stop Time  1015    SLP Time Calculation (min) 35 min    Activity Tolerance Patient tolerated treatment well             Past Medical History:  Diagnosis Date   Alcohol abuse    Diabetes mellitus without complication ([place])    Diverticulosis    GERD (gastroesophageal reflux disease)    Hyperlipidemia    Hypertension    Left atrial enlargement    Neuromuscular disorder ([place])    neuropathy   Persistent atrial fibrillation (HCC)    CHA2DS2-VASc score 4 (age-61, HTN-1, DM 2-1, aortic plaque)    Past Surgical History:  Procedure Laterality Date   APPENDECTOMY     CARDIAC CATHETERIZATION N/A 01/05/2015   Procedure: Left Heart Cath and Coronary Angiography;  Surgeon: Pixie Casino, MD;  Location: [facility];  Service: Cardiovascular;; (For Abnormal Nuclear Stress Test) mild luminal irregularities of the LCx with mild areas of ectasia.  Calcified ostial D1 mild stenosis. ->  Mild/nonobstructive CAD.   CARDIOVERSION N/A 11/01/2014   Procedure: CARDIOVERSION;  Surgeon: Pixie Casino, MD;  Location: [facility] ENDOSCOPY;  Service: Cardiovascular;  Laterality: N/A;   CARDIOVERSION N/A 02/24/2015   Procedure: CARDIOVERSION;  Surgeon: Pixie Casino, MD;  Location: [facility] ENDOSCOPY;  Service: Cardiovascular;  Laterality: N/A;   CATARACT EXTRACTION Bilateral    COLON SURGERY N/A 2006   14  inches removed from diverticitis perforation   COLONOSCOPY     Per stoma   EYE SURGERY Bilateral    Cat Sx   INNER EAR SURGERY     as a child   NM MYOVIEW LTD  12/08/2014   FALSE POSITIVE: Read asINTERMEDIATE RISK.  Partially reversible medium size moderate severity defect in the basal--mid-apical inferoseptal-inferior and apical walls.  Not gated due to A. fib. ->  Referred for cath- NON-OBSTRUCTIVE CAD   ROBOTIC ASSITED PARTIAL NEPHRECTOMY Right 05/23/2021   Procedure: XI RETROPERITONEAL ROBOTIC ASSITED PARTIAL NEPHRECTOMY;  Surgeon: Alexis Frock, MD;  [facility];  Service: Urology;  Laterality: Right;   TEE WITHOUT CARDIOVERSION N/A 11/01/2014   Procedure: TRANSESOPHAGEAL ECHOCARDIOGRAM (TEE) -unsuccesful DCCV;  Surgeon: Pixie Casino, MD;  Location: [facility];  Service: Cardiovascular;; Mild concentric LVH.  EF 55 to 60%.  Normal wall motion.  Grade 3 aortic atheroma.  No source of cardiac emboli.  -->  Followed by unsuccessful DCCV.    There were no vitals filed for this visit.   Subjective Assessment - 06/13/21 0946     Subjective "I've been stretching and practicing when I can"    Currently in Pain? No/denies                   ADULT SLP TREATMENT - 06/13/21 0947       General Information   Behavior/Cognition Alert;Cooperative;Pleasant mood  Treatment Provided   Treatment provided Cognitive-Linquistic      Cognitive-Linquistic Treatment   Treatment focused on Dysarthria;Patient/family/caregiver education    Skilled Treatment Rick demonstrated HEP for dysarthria with rare min A to reduce rate and make each sound, especially the last sound. He is making phone calls successfully. In structured task repeating multisyllabic words and generating sentence with each word with occasional min A to ID and self correct words in error 20/20 words/sentences.HEP for labial weakness with rare min A. In task generating 3 sentence descriptions , he carried over  compensations of slow rate and over articulation with rare min A 24/24 sentences, again with cues to self correct slurred words. Conversation 100% intelliglble today with rare min A over 10 minute conversation              SLP Education - 06/13/21 1010     Education Details Added to HEP, correct slurred words, go slower than you want    Person(s) Educated Patient    Methods Explanation;Demonstration;Verbal cues;Handout    Comprehension Verbalized understanding;Returned demonstration;Verbal cues required                SLP Long Term Goals - 06/13/21 1015       SLP LONG TERM GOAL #1   Title Pt will complete HEP for dysarthria with mod I    Time 6    Period Weeks    Status On-going      SLP LONG TERM GOAL #2   Title Pt will be 100% intelligible mildly complex conversation with mod I    Time 6    Period Weeks    Status On-going      SLP LONG TERM GOAL #3   Title Pt will be 100% intelligible over 15 minute conversation in noisy environment    Time 6    Period Weeks    Status On-going      SLP LONG TERM GOAL #4   Title Pt will report 3 or less requests for repetition on phone calls over 1 week    Time 6    Period Weeks    Status On-going      SLP LONG TERM GOAL #5   Title Pt will improve score on Communicative Effectiveness Surgey by 2 points    Baseline 23 initial score    Time 6    Period Weeks    Status On-going      SLP LONG TERM GOAL #6   Title Pt will report 50% reduction in drool subjectively    Time 6    Period Weeks    Status On-going              Plan - 06/13/21 1011     Clinical Impression Statement Mild dysarthria persists, Pt completing HEP for dysarthria and labial weakness with rare min A. Rare min A to use slow rate, over articulation and volume in structured tasks as well as conversation to reduce slur. Pt has not carried over compensations for drool We reviewed these and he will target drool this week as well. Await OT order. Continue  skilled ST to maximize intelligiblity and accuracy of speech for QOL and social and community interactions.    Speech Therapy Frequency 2x / week    Duration --   6 weeks   Treatment/Interventions Aspiration precaution training;Environmental controls;SLP instruction and feedback;Oral motor exercises;Cueing hierarchy;Compensatory strategies;Functional tasks;Cognitive reorganization;Patient/family education;Multimodal communcation approach;Internal/external aids;Diet toleration management by SLP    Potential to Achieve Goals Good  Patient will benefit from skilled therapeutic intervention in order to improve the following deficits and impairments:   Dysarthria and anarthria    Problem List Patient Active Problem List   Diagnosis Date Noted   Stroke ([place]) 06/02/2021   Ischemic stroke of frontal lobe ([place]) 06/01/2021   Acute kidney injury superimposed on CKD ([place]) 06/01/2021   Renal mass 05/23/2021   Aortic atherosclerosis ([place]) 04/10/2021   Preop cardiovascular exam 02/09/2021   Permanent atrial fibrillation ([place]) -CHA2DS2-VASc score 4 11/24/2014   Encounter for cardioversion procedure    DM2 (diabetes mellitus, type 2) ([place]) 09/29/2014   Essential hypertension 09/29/2014   Dyslipidemia associated with type 2 diabetes mellitus ([place]) 09/29/2014    Hawley Pavia, Annye Rusk, [place] 06/13/2021, 10:16 AM  [facility] [address] [place], [place], [address] Phone: [phone]   Fax:  [phone]   Name: DEVONTA BLANFORD MRN: [id number] Date of Birth: 10-28-1955

## 2021-06-13 NOTE — Patient Instructions (Addendum)
° °  Call Claiborne [name] at Dr. Raul Del office and ask about the OT order for neuro rehab as we don't see it in Epic   Remember: Use a loose rubber band or Clayborn Bigness bracelet to remind yourself to swallow each time you see it  To reduce drool, swallow more frequently  Tuck your lips in and smile big with them tucked in  Tuck your lips in and pop your lips  Practice talking slow and to SIRI and see how she is understanding you  When you hear a word that sounds slurred or does not come out right, correct it

## 2021-06-14 ENCOUNTER — Ambulatory Visit: Payer: Medicare Other | Admitting: Speech Pathology

## 2021-06-14 ENCOUNTER — Encounter: Payer: Self-pay | Admitting: Speech Pathology

## 2021-06-14 DIAGNOSIS — R471 Dysarthria and anarthria: Secondary | ICD-10-CM

## 2021-06-14 NOTE — Patient Instructions (Signed)
° °  Use the mirror with your face exercises to give you feed back on how your mouth is moving  Read aloud 10 minutes twice a day focusing on slow big speech, making each sound distinct  It is harder to remember to go slow and over enunciate when you are out and about because there is a lot going on and you have to process what is being said and what you want to say

## 2021-06-14 NOTE — Therapy (Signed)
[facility] [address] [place], [place], [address] Phone: [phone]   Fax:  [phone]  Speech Language Pathology Treatment  Patient Details  Name: Angel Shelton MRN: [id number] Date of Birth: 05/24/1956 Referring Provider (SLP): Dr. Kathie Dike (hospitalist) Dr. Leonie Man (new pt to be seen 07/30/21)   Encounter Date: 06/14/2021   End of Session - 06/14/21 1222     Visit Number 3    Number of Visits 17    Date for SLP Re-Evaluation 08/03/21    Authorization Type BCBS medicare    SLP Start Time 1700    SLP Stop Time  1749    SLP Time Calculation (min) 35 min    Activity Tolerance Patient tolerated treatment well             Past Medical History:  Diagnosis Date   Alcohol abuse    Diabetes mellitus without complication ([place])    Diverticulosis    GERD (gastroesophageal reflux disease)    Hyperlipidemia    Hypertension    Left atrial enlargement    Neuromuscular disorder (HCC)    neuropathy   Persistent atrial fibrillation (HCC)    CHA2DS2-VASc score 4 (age-75, HTN-1, DM 2-1, aortic plaque)    Past Surgical History:  Procedure Laterality Date   APPENDECTOMY     CARDIAC CATHETERIZATION N/A 01/05/2015   Procedure: Left Heart Cath and Coronary Angiography;  Surgeon: Pixie Casino, MD;  Location: [facility];  Service: Cardiovascular;; (For Abnormal Nuclear Stress Test) mild luminal irregularities of the LCx with mild areas of ectasia.  Calcified ostial D1 mild stenosis. ->  Mild/nonobstructive CAD.   CARDIOVERSION N/A 11/01/2014   Procedure: CARDIOVERSION;  Surgeon: Pixie Casino, MD;  Location: [facility] ENDOSCOPY;  Service: Cardiovascular;  Laterality: N/A;   CARDIOVERSION N/A 02/24/2015   Procedure: CARDIOVERSION;  Surgeon: Pixie Casino, MD;  Location: [facility] ENDOSCOPY;  Service: Cardiovascular;  Laterality: N/A;   CATARACT EXTRACTION Bilateral    COLON SURGERY N/A 2006   14 inches removed from  diverticitis perforation   COLONOSCOPY     Per stoma   EYE SURGERY Bilateral    Cat Sx   INNER EAR SURGERY     as a child   NM MYOVIEW LTD  12/08/2014   FALSE POSITIVE: Read asINTERMEDIATE RISK.  Partially reversible medium size moderate severity defect in the basal--mid-apical inferoseptal-inferior and apical walls.  Not gated due to A. fib. ->  Referred for cath- NON-OBSTRUCTIVE CAD   ROBOTIC ASSITED PARTIAL NEPHRECTOMY Right 05/23/2021   Procedure: XI RETROPERITONEAL ROBOTIC ASSITED PARTIAL NEPHRECTOMY;  Surgeon: Alexis Frock, MD;  [facility];  Service: Urology;  Laterality: Right;   TEE WITHOUT CARDIOVERSION N/A 11/01/2014   Procedure: TRANSESOPHAGEAL ECHOCARDIOGRAM (TEE) -unsuccesful DCCV;  Surgeon: Pixie Casino, MD;  Location: [facility];  Service: Cardiovascular;; Mild concentric LVH.  EF 55 to 60%.  Normal wall motion.  Grade 3 aortic atheroma.  No source of cardiac emboli.  -->  Followed by unsuccessful DCCV.    There were no vitals filed for this visit.   Subjective Assessment - 06/14/21 1221     Subjective "I see the doctor tomorrow so we will get it worked out"    Currently in Pain? No/denies                   ADULT SLP TREATMENT - 06/14/21 1153       General Information   Behavior/Cognition Alert;Cooperative;Pleasant mood  Treatment Provided   Treatment provided Cognitive-Linquistic      Cognitive-Linquistic Treatment   Treatment focused on Dysarthria;Patient/family/caregiver education    Skilled Treatment Rick conitnues to complete HEP consistently. Today HEP with rare min A and use of mirror. [name] carried over compensations for dysarthria with rare min A in structured task 18/20 sentences. In simple conversation, [name] carried over compensations  with rare min A and self corecting errors with rare min A. He believes he is able to hear when his speech is slurred and correct it with others.      Assessment / Recommendations / Plan   Plan  Continue with current plan of care      Progression Toward Goals   Progression toward goals Progressing toward goals              SLP Education - 06/14/21 1221     Education Details added reading aloud to HEP,    Person(s) Educated Patient    Methods Explanation;Demonstration;Verbal cues;Handout    Comprehension Verbalized understanding;Returned demonstration;Verbal cues required                SLP Long Term Goals - 06/14/21 1222       SLP LONG TERM GOAL #1   Title Pt will complete HEP for dysarthria with mod I    Time 6    Period Weeks    Status On-going      SLP LONG TERM GOAL #2   Title Pt will be 100% intelligible mildly complex conversation with mod I    Time 6    Period Weeks    Status On-going      SLP LONG TERM GOAL #3   Title Pt will be 100% intelligible over 15 minute conversation in noisy environment    Time 6    Period Weeks    Status On-going      SLP LONG TERM GOAL #4   Title Pt will report 3 or less requests for repetition on phone calls over 1 week    Time 6    Period Weeks    Status On-going      SLP LONG TERM GOAL #5   Title Pt will improve score on Communicative Effectiveness Surgey by 2 points    Baseline 23 initial score    Time 6    Period Weeks    Status On-going      SLP LONG TERM GOAL #6   Title Pt will report 50% reduction in drool subjectively    Time 6    Period Weeks    Status On-going              Plan - 06/14/21 1221     Clinical Impression Statement Mild dysarthria persists, Pt completing HEP for dysarthria and labial weakness with rare min A. Rare min A to use slow rate, over articulation and volume in structured tasks as well as conversation to reduce slur. Pt has not carried over compensations for drool We reviewed these and he will target drool this week as well. Await OT order. Continue skilled ST to maximize intelligiblity and accuracy of speech for QOL and social and community interactions.              Patient will benefit from skilled therapeutic intervention in order to improve the following deficits and impairments:   Dysarthria and anarthria    Problem List Patient Active Problem List   Diagnosis Date Noted   Stroke ([place]) 06/02/2021   Ischemic  stroke of frontal lobe ([place]) 06/01/2021   Acute kidney injury superimposed on CKD ([place]) 06/01/2021   Renal mass 05/23/2021   Aortic atherosclerosis ([place]) 04/10/2021   Preop cardiovascular exam 02/09/2021   Permanent atrial fibrillation (HCC) -CHA2DS2-VASc score 4 11/24/2014   Encounter for cardioversion procedure    DM2 (diabetes mellitus, type 2) ([place]) 09/29/2014   Essential hypertension 09/29/2014   Dyslipidemia associated with type 2 diabetes mellitus ([place]) 09/29/2014    Mychael Smock, Annye Rusk, [place] 06/14/2021, 12:23 PM  [facility] [address] [place], [place], [address] Phone: [phone]   Fax:  [phone]   Name: BRENON ANTOSH MRN: [id number] Date of Birth: 01/01/1956

## 2021-06-15 DIAGNOSIS — I10 Essential (primary) hypertension: Secondary | ICD-10-CM | POA: Diagnosis not present

## 2021-06-15 DIAGNOSIS — I69322 Dysarthria following cerebral infarction: Secondary | ICD-10-CM | POA: Diagnosis not present

## 2021-06-15 DIAGNOSIS — E785 Hyperlipidemia, unspecified: Secondary | ICD-10-CM | POA: Diagnosis not present

## 2021-06-15 DIAGNOSIS — I639 Cerebral infarction, unspecified: Secondary | ICD-10-CM | POA: Diagnosis not present

## 2021-06-18 ENCOUNTER — Ambulatory Visit: Payer: Medicare Other | Admitting: Speech Pathology

## 2021-06-18 ENCOUNTER — Encounter: Payer: Self-pay | Admitting: Speech Pathology

## 2021-06-18 ENCOUNTER — Other Ambulatory Visit: Payer: Self-pay

## 2021-06-18 DIAGNOSIS — R471 Dysarthria and anarthria: Secondary | ICD-10-CM | POA: Diagnosis not present

## 2021-06-18 NOTE — Patient Instructions (Addendum)
° °  Monday, 1/30 you have ST at 9:30 then an OT eval at 10:15   Fatigue to normal after a stroke, short nap during the day is OK  You may feel tired the next day after a busy day before  Your brain is healing, but we can't see it, it is not like you hurt your arm and have a cast that others can see  Doristine Devoid job making your business calls in the morning when your speech is better  When you practice the hard words, say they 3x then put them in a sentence  Great job self advertising that you are having trouble talking and need to go slow and need some patience of the person on the other line.  When you are making calls, make sure your background is quiet - no TV, radio or appliances  With more complicated calls, you are working hard to process what the other person is explaining and asking of you. The more of a cognitive load a call is, the harder it is to remember to focus on your slow rate and big speech. This is normal, but practice will help it improve.   Pay attention to how loud the environment is, such as a restaurant. You will need to make an effort to speak clear and loud above all of that noise  Try to swish with water from cheek to cheek over the sink

## 2021-06-18 NOTE — Therapy (Signed)
[facility] [address] [place], [place], [address] Phone: [phone]   Fax:  [phone]  Speech Language Pathology Treatment  Patient Details  Name: Angel Shelton MRN: [id number] Date of Birth: September 12, 1955 Referring Provider (SLP): Dr. Kathie Dike (hospitalist) Dr. Leonie Man (new pt to be seen 07/30/21)   Encounter Date: 06/18/2021   End of Session - 06/18/21 1206     Visit Number 4    Number of Visits 17    Date for SLP Re-Evaluation 08/03/21    Authorization Type BCBS medicare    SLP Start Time 1015    SLP Stop Time  1100    SLP Time Calculation (min) 45 min    Activity Tolerance Patient tolerated treatment well             Past Medical History:  Diagnosis Date   Alcohol abuse    Diabetes mellitus without complication ([place])    Diverticulosis    GERD (gastroesophageal reflux disease)    Hyperlipidemia    Hypertension    Left atrial enlargement    Neuromuscular disorder (HCC)    neuropathy   Persistent atrial fibrillation (HCC)    CHA2DS2-VASc score 4 (age-69, HTN-1, DM 2-1, aortic plaque)    Past Surgical History:  Procedure Laterality Date   APPENDECTOMY     CARDIAC CATHETERIZATION N/A 01/05/2015   Procedure: Left Heart Cath and Coronary Angiography;  Surgeon: Pixie Casino, MD;  Location: [facility];  Service: Cardiovascular;; (For Abnormal Nuclear Stress Test) mild luminal irregularities of the LCx with mild areas of ectasia.  Calcified ostial D1 mild stenosis. ->  Mild/nonobstructive CAD.   CARDIOVERSION N/A 11/01/2014   Procedure: CARDIOVERSION;  Surgeon: Pixie Casino, MD;  Location: [facility] ENDOSCOPY;  Service: Cardiovascular;  Laterality: N/A;   CARDIOVERSION N/A 02/24/2015   Procedure: CARDIOVERSION;  Surgeon: Pixie Casino, MD;  Location: [facility] ENDOSCOPY;  Service: Cardiovascular;  Laterality: N/A;   CATARACT EXTRACTION Bilateral    COLON SURGERY N/A 2006   14 inches removed from  diverticitis perforation   COLONOSCOPY     Per stoma   EYE SURGERY Bilateral    Cat Sx   INNER EAR SURGERY     as a child   NM MYOVIEW LTD  12/08/2014   FALSE POSITIVE: Read asINTERMEDIATE RISK.  Partially reversible medium size moderate severity defect in the basal--mid-apical inferoseptal-inferior and apical walls.  Not gated due to A. fib. ->  Referred for cath- NON-OBSTRUCTIVE CAD   ROBOTIC ASSITED PARTIAL NEPHRECTOMY Right 05/23/2021   Procedure: XI RETROPERITONEAL ROBOTIC ASSITED PARTIAL NEPHRECTOMY;  Surgeon: Alexis Frock, MD;  [facility];  Service: Urology;  Laterality: Right;   TEE WITHOUT CARDIOVERSION N/A 11/01/2014   Procedure: TRANSESOPHAGEAL ECHOCARDIOGRAM (TEE) -unsuccesful DCCV;  Surgeon: Pixie Casino, MD;  Location: [facility];  Service: Cardiovascular;; Mild concentric LVH.  EF 55 to 60%.  Normal wall motion.  Grade 3 aortic atheroma.  No source of cardiac emboli.  -->  Followed by unsuccessful DCCV.    There were no vitals filed for this visit.   Subjective Assessment - 06/18/21 1019     Subjective "I'm doing better, it's real clean in the morning, then gets a litle worse in the evening"    Currently in Pain? No/denies                   ADULT SLP TREATMENT - 06/18/21 1019       General Information  Behavior/Cognition Alert;Cooperative;Pleasant mood      Treatment Provided   Treatment provided Cognitive-Linquistic      Cognitive-Linquistic Treatment   Treatment focused on Dysarthria;Patient/family/caregiver education    Skilled Treatment [name] continues to complete HEP, but is not repetaing words 3x, instructed him to do so. He is practicing reading aloud as part of HEP focusing on SLOP. [place], HEP with ryhming sentences, [name] carried over slow rate, over artciulation and good volume with rare mn A 20/20 sentences. In structured task, [name] carried over compensations with added cognitive load, 25/25 sentences - no errors to correct, with mod  I. In simple conversation              SLP Education - 06/18/21 1201     Education Details OT eval 06/25/21; energy conservation, carryover compensations in noisy environments    Person(s) Educated Patient    Methods Explanation;Demonstration;Verbal cues;Handout    Comprehension Verbalized understanding;Returned demonstration;Verbal cues required;Need further instruction                SLP Long Term Goals - 06/18/21 1204       SLP LONG TERM GOAL #1   Title Pt will complete HEP for dysarthria with mod I    Time 5    Period Weeks    Status On-going      SLP LONG TERM GOAL #2   Title Pt will be 100% intelligible mildly complex conversation with mod I    Time 5    Period Weeks    Status On-going      SLP LONG TERM GOAL #3   Title Pt will be 100% intelligible over 15 minute conversation in noisy environment    Time 5    Period Weeks    Status On-going      SLP LONG TERM GOAL #4   Title Pt will report 3 or less requests for repetition on phone calls over 1 week    Time 5    Period Weeks    Status On-going      SLP LONG TERM GOAL #5   Title Pt will improve score on Communicative Effectiveness Surgey by 2 points    Baseline 23 initial score    Time 5    Period Weeks    Status On-going      SLP LONG TERM GOAL #6   Title Pt will report 50% reduction in drool subjectively    Time 5    Period Weeks    Status On-going              Plan - 06/18/21 1202     Clinical Impression Statement Improving mild dysarthria. With use of compensations, [name] carries over 100% intelligible speech over 45 minute session with 4 or less slurred words. He continues to report difficulty being understood and frustration when making complex phone calls with insurance and his mother's taxes. Increased cognitive load of these tasks continues to affect carryover of compensations for dysarthria. Added to HEP, continue skilled ST to maximize intelligilbity for safety, QOL and success in  social and phone conversations.    Speech Therapy Frequency 2x / week    Duration --   6 weeks   Treatment/Interventions Aspiration precaution training;Environmental controls;SLP instruction and feedback;Oral motor exercises;Cueing hierarchy;Compensatory strategies;Functional tasks;Cognitive reorganization;Patient/family education;Multimodal communcation approach;Internal/external aids;Diet toleration management by SLP    Potential to Achieve Goals Good             Patient will benefit from skilled therapeutic intervention in  order to improve the following deficits and impairments:   Dysarthria and anarthria    Problem List Patient Active Problem List   Diagnosis Date Noted   Stroke ([place]) 06/02/2021   Ischemic stroke of frontal lobe ([place]) 06/01/2021   Acute kidney injury superimposed on CKD ([place]) 06/01/2021   Renal mass 05/23/2021   Aortic atherosclerosis ([place]) 04/10/2021   Preop cardiovascular exam 02/09/2021   Permanent atrial fibrillation (HCC) -CHA2DS2-VASc score 4 11/24/2014   Encounter for cardioversion procedure    DM2 (diabetes mellitus, type 2) ([place]) 09/29/2014   Essential hypertension 09/29/2014   Dyslipidemia associated with type 2 diabetes mellitus ([place]) 09/29/2014    [place] Delcastillo, Annye Rusk, [place] 06/18/2021, 12:07 PM  [facility] [address] [place], [place], [address] Phone: [phone]   Fax:  [phone]   Name: MURL GOLLADAY MRN: [id number] Date of Birth: 1956-03-03

## 2021-06-20 ENCOUNTER — Ambulatory Visit: Payer: Medicare Other | Admitting: Neurology

## 2021-06-20 ENCOUNTER — Other Ambulatory Visit: Payer: Self-pay

## 2021-06-20 ENCOUNTER — Encounter: Payer: Self-pay | Admitting: Neurology

## 2021-06-20 VITALS — BP 132/70 | HR 58 | Ht 71.0 in | Wt 167.4 lb

## 2021-06-20 DIAGNOSIS — I611 Nontraumatic intracerebral hemorrhage in hemisphere, cortical: Secondary | ICD-10-CM | POA: Diagnosis not present

## 2021-06-20 DIAGNOSIS — R29810 Facial weakness: Secondary | ICD-10-CM | POA: Diagnosis not present

## 2021-06-20 DIAGNOSIS — I4811 Longstanding persistent atrial fibrillation: Secondary | ICD-10-CM | POA: Diagnosis not present

## 2021-06-20 NOTE — Patient Instructions (Signed)
I had a long discussion with the patient with his recent right frontal intracerebral hemorrhage likely related to anticoagulation with Eliquis with underlying atrial fibrillation.  We discussed treatment options including resuming anticoagulation with Eliquis versus continuing on aspirin and the medical equipoise in this regard and lack of definitive data as to which ongoing treatment route would be beneficial.  I recommend he consider participation in the [place] clinical trial sponsored by [facility] of aspirin versus Eliquis following intracerebral hemorrhage patient with A. fib and will give him information to review and decide.  He will continue on aspirin for now.  I recommend repeat MRI scan of the brain in 3 months to look for any underlying small vascular lesion.  Maintain aggressive risk factor modification with strict control of hypertension with blood pressure goal below 130/90, lipids with LDL cholesterol goal below 70 mg percent and diabetes with hemoglobin A1c goal below 6.5%.  Return for follow-up in the future in 6 months or call earlier if necessary.

## 2021-06-20 NOTE — Progress Notes (Signed)
[facility] [address] [place]. [place] [address] [phone]       OFFICE CONSULT NOTE  Mr. Angel Shelton Date of Birth:  [birth year] Medical Record Number:  [id number]   Referring MD: Linus Mako  Reason for Referral: Intracerebral hemorrhage  HPI: Angel Shelton is a 66 year old Caucasian male seen today for initial office consultation visit for intracerebral hemorrhage.  History is obtained from the patient and review of electronic medical records and opossum reviewed available pertinent imaging films in PACS.  He has past medical history significant for chronic A. 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  Patient developed sudden onset of left facial droop and dysarthria on 05/23/2021.  He did not seek immediate medical help but subsequently since symptoms are not getting better he called his primary care physician and had an outpatient MRI scan of the brain performed which showed right frontal parenchymal hemorrhage involving precentral gyrus suggestive of likely hemorrhagic conversion of ischemic stroke.  Postcontrast images suggested some venous engorgement raising possibility of a developmental venous anomaly in that region as well.  CT angiogram of the head and neck showed no significant large vessel stenosis or occlusion.  Echocardiogram showed ejection fraction of 60 to 65% with dilated left atrium.  LDL cholesterol 62 mg percent and hemoglobin A1c 6.2.  Eliquis was stopped Patient was discharged home and is currently doing outpatient speech therapy.  His dysarthria has improved though occasionally he still has a few words towards the evening when he is tired.  His facial weakness is also improved though it is not back to baseline yet.  He denies any other recurrent stroke or TIA symptoms.  He has no prior history of  strokes or TIAs.  He states his blood pressure is under good control.  He is tolerating Lipitor well without muscle aches and pains.  He states his sugars are under good control.  He has no new complaints today. ROS:   14 system review of systems is positive for facial droop, dysarthria, difficulty speaking, bruising all other systems negative  PMH:  Past Medical History:  Diagnosis Date   Alcohol abuse    Diabetes mellitus without complication (HCC)    Diverticulosis    GERD (gastroesophageal reflux disease)    Hyperlipidemia    Hypertension    Left atrial enlargement    Neuromuscular disorder (HCC)    neuropathy   Persistent atrial fibrillation (HCC)    CHA2DS2-VASc score 4 (age-19, HTN-1, DM 2-1, aortic plaque)    Social History:  Social History   Socioeconomic History   Marital status: Single    Spouse name: Not on file   Number of children: 2   Years of education: Not on file   Highest education level: Not on file  Occupational History   Occupation: Building services engineer  Tobacco Use   Smoking status: Former    Packs/day: 1.00    Years: 30.00    Pack years: 30.00    Types: Cigarettes    Quit date: 03/05/2014    Years since quitting: 7.2   Smokeless tobacco: Never  Vaping Use   Vaping Use: Never used  Substance and Sexual Activity   Alcohol use: Yes    Alcohol/week: 14.0 standard drinks    Types: 14 Shots of liquor per week   Drug use: No   Sexual activity: Not on file  Other Topics Concern  Not on file  Social History Narrative   Works in the Hospital doctor   Right handed   Caffeine- 1 cup per day    Social Determinants of Health   Financial Resource Strain: Not on file  Food Insecurity: Not on file  Transportation Needs: Not on file  Physical Activity: Not on file  Stress: Not on file  Social Connections: Not on file  Intimate Partner Violence: Not on file    Medications:   Current Outpatient Medications on File Prior to Visit   Medication Sig Dispense Refill   aspirin 81 MG chewable tablet Chew 1 tablet (81 mg total) by mouth daily. 30 tablet 1   atorvastatin (LIPITOR) 80 MG tablet Take 1 tablet (80 mg total) by mouth daily. 30 tablet 0   Cyanocobalamin (VITAMIN B-12 PO) Take 1 tablet by mouth daily.     diltiazem (CARDIZEM CD) 240 MG 24 hr capsule Take 240 mg by mouth daily.     fenofibrate 160 MG tablet Take 160 mg by mouth daily.     gabapentin (NEURONTIN) 300 MG capsule Take 300 mg by mouth every 8 (eight) hours.     Lactobacillus-Inulin (PROBIOTIC DIGESTIVE SUPPORT PO) Take 1 tablet by mouth daily.     lisinopril-hydrochlorothiazide (ZESTORETIC) 20-12.5 MG tablet Take 1 tablet by mouth daily.     metFORMIN (GLUCOPHAGE) 1000 MG tablet Take 1,000 mg by mouth 2 (two) times daily.     metoprolol succinate (TOPROL-XL) 100 MG 24 hr tablet Take 100 mg by mouth daily.  1   Omega-3 Fatty Acids (FISH OIL) 1000 MG CAPS Take 1,200 mg by mouth 2 (two) times daily.     Wheat Dextrin (BENEFIBER DRINK MIX) PACK Take 1 Package by mouth daily.     No current facility-administered medications on file prior to visit.    Allergies:  No Known Allergies  Physical Exam General: well developed, well nourished middle-aged Caucasian male, seated, in no evident distress Head: head normocephalic and atraumatic.   Neck: supple with no carotid or supraclavicular bruits Cardiovascular: regular rate and rhythm, no murmurs Musculoskeletal: no deformity Skin:  no rash/petichiae Vascular:  Normal pulses all extremities  Neurologic Exam Mental Status: Awake and fully alert. Oriented to place and time. Recent and remote memory intact. Attention span, concentration and fund of knowledge appropriate. Mood and affect appropriate.  Cranial Nerves: Fundoscopic exam reveals sharp disc margins. Pupils equal, briskly reactive to light. Extraocular movements full without nystagmus. Visual fields full to confrontation. Hearing intact. Facial  sensation intact.  Moderate left lower facial weakness., tongue, palate moves normally and symmetrically.  Motor: Normal bulk and tone. Normal strength in all tested extremity muscles. Sensory.: intact to touch , pinprick , position and vibratory sensation.  Coordination: Rapid alternating movements normal in all extremities. Finger-to-nose and heel-to-shin performed accurately bilaterally. Gait and Station: Arises from chair without difficulty. Stance is normal. Gait demonstrates normal stride length and balance . Able to heel, toe and tandem walk with mild difficulty.  Reflexes: 1+ and symmetric. Toes downgoing.   NIHSS  2 Modified Rankin  2   ASSESSMENT: 66 year old Caucasian male with right frontal intracerebral hemorrhage in December 2022 likely related to anticoagulation with Eliquis for underlying atrial fibrillation.  Patient has made good recovery with only mild residual facial weakness.  Vascular risk factors of atrial fibrillation, diabetes, hypertension and hyperlipidemia     PLAN:I had a long discussion with the patient with his recent right frontal intracerebral hemorrhage likely related to anticoagulation  with Eliquis with underlying atrial fibrillation.  We discussed treatment options including resuming anticoagulation with Eliquis versus continuing on aspirin and the medical equipoise in this regard and lack of definitive data as to which ongoing treatment route would be beneficial.  I recommend he consider participation in the [place] clinical trial sponsored by [facility] of aspirin versus Eliquis following intracerebral hemorrhage patient with A. fib and will give him information to review and decide.  He will continue on aspirin for now.  I recommend repeat MRI scan of the brain in 3 months to look for any underlying small vascular lesion.  Maintain aggressive risk factor modification with strict control of hypertension with blood pressure goal below 130/90, lipids with LDL cholesterol  goal below 70 mg percent and diabetes with hemoglobin A1c goal below 6.5%.  Return for follow-up in the future in 6 months or call earlier if necessary.  Greater than 50% time during this 45-minute consultation visit was spent on counseling and coordination of care about his intracerebral hemorrhage and atrial fibrillation and anticoagulation risk-benefit discussion and answering questions Antony Contras, MD  Note: This document was prepared with digital dictation and possible smart phrase technology. Any transcriptional errors that result from this process are unintentional.

## 2021-06-21 ENCOUNTER — Ambulatory Visit: Payer: Medicare Other | Admitting: Speech Pathology

## 2021-06-25 ENCOUNTER — Ambulatory Visit: Payer: Medicare Other

## 2021-06-25 ENCOUNTER — Ambulatory Visit: Payer: Medicare Other | Admitting: Occupational Therapy

## 2021-06-25 ENCOUNTER — Other Ambulatory Visit: Payer: Self-pay

## 2021-06-25 DIAGNOSIS — R471 Dysarthria and anarthria: Secondary | ICD-10-CM

## 2021-06-25 NOTE — Therapy (Signed)
[facility] [address] [place], [place], [address] Phone: [phone]   Fax:  [phone]  Speech Language Pathology Treatment  Patient Details  Name: Angel Shelton MRN: [id number] Date of Birth: 10/26/1955 Referring Provider (SLP): Dr. Kathie Dike (hospitalist) Dr. Leonie Man (new pt to be seen 07/30/21)   Encounter Date: 06/25/2021   End of Session - 06/25/21 0929     Visit Number 5    Number of Visits 17    Date for SLP Re-Evaluation 08/03/21    Authorization Type BCBS medicare    SLP Start Time 0930    SLP Stop Time  1015    SLP Time Calculation (min) 45 min    Activity Tolerance Patient tolerated treatment well             Past Medical History:  Diagnosis Date   Alcohol abuse    Diabetes mellitus without complication ([place])    Diverticulosis    GERD (gastroesophageal reflux disease)    Hyperlipidemia    Hypertension    Left atrial enlargement    Neuromuscular disorder (HCC)    neuropathy   Persistent atrial fibrillation (HCC)    CHA2DS2-VASc score 4 (age-40, HTN-1, DM 2-1, aortic plaque)    Past Surgical History:  Procedure Laterality Date   APPENDECTOMY     CARDIAC CATHETERIZATION N/A 01/05/2015   Procedure: Left Heart Cath and Coronary Angiography;  Surgeon: Pixie Casino, MD;  Location: [facility];  Service: Cardiovascular;; (For Abnormal Nuclear Stress Test) mild luminal irregularities of the LCx with mild areas of ectasia.  Calcified ostial D1 mild stenosis. ->  Mild/nonobstructive CAD.   CARDIOVERSION N/A 11/01/2014   Procedure: CARDIOVERSION;  Surgeon: Pixie Casino, MD;  Location: [facility] ENDOSCOPY;  Service: Cardiovascular;  Laterality: N/A;   CARDIOVERSION N/A 02/24/2015   Procedure: CARDIOVERSION;  Surgeon: Pixie Casino, MD;  Location: [facility] ENDOSCOPY;  Service: Cardiovascular;  Laterality: N/A;   CATARACT EXTRACTION Bilateral    COLON SURGERY N/A 2006   14 inches removed from  diverticitis perforation   COLONOSCOPY     Per stoma   EYE SURGERY Bilateral    Cat Sx   INNER EAR SURGERY     as a child   NM MYOVIEW LTD  12/08/2014   FALSE POSITIVE: Read asINTERMEDIATE RISK.  Partially reversible medium size moderate severity defect in the basal--mid-apical inferoseptal-inferior and apical walls.  Not gated due to A. fib. ->  Referred for cath- NON-OBSTRUCTIVE CAD   ROBOTIC ASSITED PARTIAL NEPHRECTOMY Right 05/23/2021   Procedure: XI RETROPERITONEAL ROBOTIC ASSITED PARTIAL NEPHRECTOMY;  Surgeon: Alexis Frock, MD;  Location: [facility];  Service: Urology;  Laterality: Right;   TEE WITHOUT CARDIOVERSION N/A 11/01/2014   Procedure: TRANSESOPHAGEAL ECHOCARDIOGRAM (TEE) -unsuccesful DCCV;  Surgeon: Pixie Casino, MD;  Location: [facility];  Service: Cardiovascular;; Mild concentric LVH.  EF 55 to 60%.  Normal wall motion.  Grade 3 aortic atheroma.  No source of cardiac emboli.  -->  Followed by unsuccessful DCCV.    There were no vitals filed for this visit.   Subjective Assessment - 06/25/21 0929     Subjective "I think I'm coming along pretty good"    Currently in Pain? No/denies                   ADULT SLP TREATMENT - 06/25/21 0928       General Information   Behavior/Cognition Alert;Cooperative;Pleasant mood      Treatment Provided  Treatment provided Cognitive-Linquistic      Cognitive-Linquistic Treatment   Treatment focused on Dysarthria;Patient/family/caregiver education    Skilled Treatment Angel Shelton is pleased with progress thus far and reports improvements in speech intelligibility. Pt stated he is "getting more comfortable over the telephone." In opening conversation with new listener, usual attentive listening required to optimize listener comprehension given pt fast rate of speech and reduced articulatory precision. SLP targeted oral reading passages to address carryover of dysarthria compensations for longer connected speech tasks. Usual fast  rate and reduced articulatory precision exhibited. SLP utilized auditory recordings as feedback as pt demonstrated inconsistent awareness of fast rate. Pt verbalized awareness and able to slow rate and over-ennunicate at sentence and passage level with intermittent SLP cues. SLP added [employer] to HEP as reading practice and recommending calling at least one person a day for speech practice as pt lives alone.      Assessment / Recommendations / Plan   Plan Continue with current plan of care      Progression Toward Goals   Progression toward goals Progressing toward goals              SLP Education - 06/25/21 1134     Education Details [employer], practice talking on the phone    Person(s) Educated Patient    Methods Explanation;Demonstration;Handout    Comprehension Verbalized understanding;Returned demonstration;Need further instruction                SLP Long Term Goals - 06/25/21 0929       SLP LONG TERM GOAL #1   Title Pt will complete HEP for dysarthria with mod I    Time 4    Period Weeks    Status On-going      SLP LONG TERM GOAL #2   Title Pt will be 100% intelligible mildly complex conversation with mod I    Time 4    Period Weeks    Status On-going      SLP LONG TERM GOAL #3   Title Pt will be 100% intelligible over 15 minute conversation in noisy environment    Time 4    Period Weeks    Status On-going      SLP LONG TERM GOAL #4   Title Pt will report 3 or less requests for repetition on phone calls over 1 week    Time 4    Period Weeks    Status On-going      SLP LONG TERM GOAL #5   Title Pt will improve score on Communicative Effectiveness Surgey by 2 points    Baseline 23 initial score    Time 4    Period Weeks    Status On-going      SLP LONG TERM GOAL #6   Title Pt will report 50% reduction in drool subjectively    Time 4    Period Weeks    Status On-going              Plan - 06/25/21 0929     Clinical Impression  Statement Improving mild dysarthria. With use of compensations and intermittent cues to slow rate, Angel Shelton carries over 100% intelligible speech over 45 minute session with occasional repetitions. He continues to report difficulty being understood and frustration when making complex phone calls with insurance and his mother's taxes. Increased cognitive load of these tasks continues to affect carryover of compensations for dysarthria. Added to HEP. continue skilled ST to maximize intelligilbity for safety, QOL and  success in social and phone conversations.    Speech Therapy Frequency 2x / week    Duration --   6 weeks   Treatment/Interventions Aspiration precaution training;Environmental controls;SLP instruction and feedback;Oral motor exercises;Cueing hierarchy;Compensatory strategies;Functional tasks;Cognitive reorganization;Patient/family education;Multimodal communcation approach;Internal/external aids;Diet toleration management by SLP    Potential to Achieve Goals Good             Patient will benefit from skilled therapeutic intervention in order to improve the following deficits and impairments:   Dysarthria and anarthria    Problem List Patient Active Problem List   Diagnosis Date Noted   Stroke ([place]) 06/02/2021   Ischemic stroke of frontal lobe ([place]) 06/01/2021   Acute kidney injury superimposed on CKD ([place]) 06/01/2021   Renal mass 05/23/2021   Aortic atherosclerosis ([place]) 04/10/2021   Preop cardiovascular exam 02/09/2021   Permanent atrial fibrillation ([place]) -CHA2DS2-VASc score 4 11/24/2014   Encounter for cardioversion procedure    DM2 (diabetes mellitus, type 2) ([place]) 09/29/2014   Essential hypertension 09/29/2014   Dyslipidemia associated with type 2 diabetes mellitus ([place]) 09/29/2014    Alinda Deem, [place] 06/25/2021, 11:40 AM  [facility] [address] [place], [place], [address] Phone: [phone]    Fax:  [phone]   Name: Angel Shelton MRN: [id number] Date of Birth: [birth year]

## 2021-06-25 NOTE — Patient Instructions (Signed)
Get [employer] app and practice reading aloud articles (press pause button so it won't read aloud to you) - think SLOW, say each word to make it stand out, and take a breathe between each sentence or if you feel you are running out of air.    Work 10-15 minutes on concentrated [employer], [employer], twice a day   Call at least one person a day to get your speech practice in - again, focused on going slow and moving your mouth big. Pick a listener who can provide you some feedback if you're too fast or they didn't understand you

## 2021-06-27 ENCOUNTER — Ambulatory Visit: Payer: Medicare Other | Attending: Internal Medicine | Admitting: Speech Pathology

## 2021-06-27 ENCOUNTER — Encounter: Payer: Self-pay | Admitting: Speech Pathology

## 2021-06-27 ENCOUNTER — Other Ambulatory Visit: Payer: Self-pay

## 2021-06-27 DIAGNOSIS — R471 Dysarthria and anarthria: Secondary | ICD-10-CM | POA: Diagnosis not present

## 2021-06-27 DIAGNOSIS — R278 Other lack of coordination: Secondary | ICD-10-CM | POA: Insufficient documentation

## 2021-06-27 NOTE — Patient Instructions (Addendum)
° °  Eliminate throat clears - this will be hard as it has become a habit - swallow before you start to talk rather than clearing your throat  Your daughters can point out when you clear your throat because you are not aware each time you do it  When you feel the urge to clear your throat, do a hard swallow, take a sip or forcible "Huh" then swallow  When you hear a hoarse voice, you need to take breath and talk on that air. Don't push out lots of words on 1 breath  Breathe more frequently when talking or doing your ST exercises  Voice exercises 2-3x a day for 5 -10 minutes - breathe frequently or whenever you are going to do a lot of talking or phone call  Gargle to reduce tension in your voice box  Blow bubbles with a hum - 10 breaths  Hum through a straw not in water - 10 breaths  Pitch glides (siren)  through straw in our out of water - 10 breaths  National anthem through straw in or of water - breathe at [facility] pause  When you feel vocal strain, breathe from diaphragm and the tension should be in your abdomen   ABDOMINAL BREATHING FOR SPEECH   Shoulders down - this is a cue to relax Place your hand on your abdomen - this helps you focus on easy abdominal breath support - the best and most relaxed way to breathe Breathe in through your nose and fill your belly with air, watching your hand move outward Breathe out through your mouth and watch your belly move in.   Think of your belly as a balloon, when you fill with air (inhale), the balloon gets bigger. As the air goes out (exhale), the balloon deflates.  If you are having difficulty coordinating this, lay on your back with a plastic cup on your belly and repeat the above steps, watching you belly move up with inhalation and down with exhalations  Practice breathing in and out in front of a mirror, watching your belly Breathe in for a count of 5 and breathe out for a count of 5   There's an App for that:  Breathe2relax  Provided by: Georgiann Hahn. SLP [phone]

## 2021-06-27 NOTE — Therapy (Signed)
[facility] [address] [place], [place], [address] Phone: [phone]   Fax:  [phone]  Speech Language Pathology Treatment  Patient Details  Name: Angel Shelton MRN: [id number] Date of Birth: 03/04/1956 Referring Provider (SLP): Dr. Kathie Dike (hospitalist) Dr. Leonie Man (new pt to be seen 07/30/21)   Encounter Date: 06/27/2021   End of Session - 06/27/21 1152     Visit Number 6    Number of Visits 17    Date for SLP Re-Evaluation 08/03/21    Authorization Type BCBS medicare    SLP Start Time 1100    SLP Stop Time  1140    SLP Time Calculation (min) 40 min    Activity Tolerance Patient tolerated treatment well             Past Medical History:  Diagnosis Date   Alcohol abuse    Diabetes mellitus without complication ([place])    Diverticulosis    GERD (gastroesophageal reflux disease)    Hyperlipidemia    Hypertension    Left atrial enlargement    Neuromuscular disorder ([place])    neuropathy   Persistent atrial fibrillation (HCC)    CHA2DS2-VASc score 4 (age-22, HTN-1, DM 2-1, aortic plaque)    Past Surgical History:  Procedure Laterality Date   APPENDECTOMY     CARDIAC CATHETERIZATION N/A 01/05/2015   Procedure: Left Heart Cath and Coronary Angiography;  Surgeon: Pixie Casino, MD;  Location: [facility];  Service: Cardiovascular;; (For Abnormal Nuclear Stress Test) mild luminal irregularities of the LCx with mild areas of ectasia.  Calcified ostial D1 mild stenosis. ->  Mild/nonobstructive CAD.   CARDIOVERSION N/A 11/01/2014   Procedure: CARDIOVERSION;  Surgeon: Pixie Casino, MD;  Location: [facility] ENDOSCOPY;  Service: Cardiovascular;  Laterality: N/A;   CARDIOVERSION N/A 02/24/2015   Procedure: CARDIOVERSION;  Surgeon: Pixie Casino, MD;  Location: [facility] ENDOSCOPY;  Service: Cardiovascular;  Laterality: N/A;   CATARACT EXTRACTION Bilateral    COLON SURGERY N/A 2006   14 inches removed from  diverticitis perforation   COLONOSCOPY     Per stoma   EYE SURGERY Bilateral    Cat Sx   INNER EAR SURGERY     as a child   NM MYOVIEW LTD  12/08/2014   FALSE POSITIVE: Read asINTERMEDIATE RISK.  Partially reversible medium size moderate severity defect in the basal--mid-apical inferoseptal-inferior and apical walls.  Not gated due to A. fib. ->  Referred for cath- NON-OBSTRUCTIVE CAD   ROBOTIC ASSITED PARTIAL NEPHRECTOMY Right 05/23/2021   Procedure: XI RETROPERITONEAL ROBOTIC ASSITED PARTIAL NEPHRECTOMY;  Surgeon: Alexis Frock, MD;  [facility]: [facility];  Service: Urology;  Laterality: Right;   TEE WITHOUT CARDIOVERSION N/A 11/01/2014   Procedure: TRANSESOPHAGEAL ECHOCARDIOGRAM (TEE) -unsuccesful DCCV;  Surgeon: Pixie Casino, MD;  Location: [facility];  Service: Cardiovascular;; Mild concentric LVH.  EF 55 to 60%.  Normal wall motion.  Grade 3 aortic atheroma.  No source of cardiac emboli.  -->  Followed by unsuccessful DCCV.    There were no vitals filed for this visit.   Subjective Assessment - 06/27/21 1103     Subjective "I have become part of a study for blood thinners vs asa for a fib"    Currently in Pain? No/denies                   ADULT SLP TREATMENT - 06/27/21 1104       General Information   Behavior/Cognition Alert;Cooperative;Pleasant  mood      Treatment Provided   Treatment provided Cognitive-Linquistic      Cognitive-Linquistic Treatment   Treatment focused on Dysarthria;Patient/family/caregiver education    Skilled Treatment Angel Shelton reports less drool and improve symmetry in his lips" He continues to avoid phone calls when he is able.He enters today with multiple agressive throat clears and hoarse voice. He reports his voice gets hoarse and by the end of the week he is very hoarse. Trained Angel Shelton in throat clear alternatives. He required frequent A to ID when he clears his throat due to habitual throat clears. Trained pt in semiocculded vocal tract  exercises (SOVTE) to address laryngeal tension. Instructed Angel Shelton on breathing more frequently when talking. Provided frequent verbal cues for breath and to swallow before speaking, to reduce throat clear before speaking. In HEP for dysarthria, ST modeled and provided verbal cues for frequency of breaths required, and to ID vocal fry as a cue that he needs to breathe. Angel Shelton carried over slow rate and over articulation in structured tasks, HEP and simple conversation, with slur less than 20% of words.      Assessment / Recommendations / Plan   Plan Continue with current plan of care              SLP Education - 06/27/21 1149     Education Details abdominal breath, eliminate throat clears, SOVTE, vocal fry    Person(s) Educated Patient    Methods Explanation;Demonstration;Verbal cues;Handout    Comprehension Verbalized understanding;Returned demonstration;Verbal cues required;Need further instruction                SLP Long Term Goals - 06/27/21 1151       SLP LONG TERM GOAL #1   Title Pt will complete HEP for dysarthria with mod I    Time 3    Period Weeks    Status On-going      SLP LONG TERM GOAL #2   Title Pt will be 100% intelligible mildly complex conversation with mod I    Time 3    Period Weeks    Status On-going      SLP LONG TERM GOAL #3   Title Pt will be 100% intelligible over 15 minute conversation in noisy environment    Time 3    Period Weeks    Status On-going      SLP LONG TERM GOAL #4   Title Pt will report 3 or less requests for repetition on phone calls over 1 week    Time 3    Period Weeks    Status On-going      SLP LONG TERM GOAL #5   Title Pt will improve score on Communicative Effectiveness Surgey by 2 points    Baseline 23 initial score    Time 3    Period Weeks    Status On-going      SLP LONG TERM GOAL #6   Title Pt will report 50% reduction in drool subjectively    Time 3    Period Weeks    Status On-going               Plan - 06/27/21 1150     Clinical Impression Statement Improving mild dysarthria. With use of compensations and intermittent cues to slow rate, Angel Shelton carries over 100% intelligible speech over 45 minute session with occasional repetitions. He continues to report difficulty being understood and frustration when making complex phone calls with insurance and is avoiding phone calls due  to dysarthria. Increased cognitive load of these tasks continues to affect carryover of compensations for dysarthria. Added to HEP SOVTE due to laryngeal tension and hoarseness. Instrcuted him in vocal hygiene. continue skilled ST to maximize intelligilbity for safety, QOL and success in social and phone conversations.    Speech Therapy Frequency 2x / week    Duration --   6 weeks   Treatment/Interventions Aspiration precaution training;Environmental controls;SLP instruction and feedback;Oral motor exercises;Cueing hierarchy;Compensatory strategies;Functional tasks;Cognitive reorganization;Patient/family education;Multimodal communcation approach;Internal/external aids;Diet toleration management by SLP    Potential to Achieve Goals Good             Patient will benefit from skilled therapeutic intervention in order to improve the following deficits and impairments:   Dysarthria and anarthria    Problem List Patient Active Problem List   Diagnosis Date Noted   Stroke ([place]) 06/02/2021   Ischemic stroke of frontal lobe ([place]) 06/01/2021   Acute kidney injury superimposed on CKD ([place]) 06/01/2021   Renal mass 05/23/2021   Aortic atherosclerosis ([place]) 04/10/2021   Preop cardiovascular exam 02/09/2021   Permanent atrial fibrillation ([place]) -CHA2DS2-VASc score 4 11/24/2014   Encounter for cardioversion procedure    DM2 (diabetes mellitus, type 2) ([place]) 09/29/2014   Essential hypertension 09/29/2014   Dyslipidemia associated with type 2 diabetes mellitus ([place]) 09/29/2014    Marlena Barbato, Annye Rusk, [place] 06/27/2021,  11:53 AM  [facility] [address] [place], [place], [address] Phone: [phone]   Fax:  [phone]   Name: HIDEO GOOGE MRN: [id number] Date of Birth: 20-May-1956

## 2021-07-02 ENCOUNTER — Other Ambulatory Visit: Payer: Self-pay

## 2021-07-02 ENCOUNTER — Ambulatory Visit: Payer: Medicare Other | Admitting: Occupational Therapy

## 2021-07-02 ENCOUNTER — Ambulatory Visit: Payer: Medicare Other | Admitting: Speech Pathology

## 2021-07-02 DIAGNOSIS — R278 Other lack of coordination: Secondary | ICD-10-CM | POA: Diagnosis not present

## 2021-07-02 DIAGNOSIS — R471 Dysarthria and anarthria: Secondary | ICD-10-CM | POA: Diagnosis not present

## 2021-07-02 NOTE — Therapy (Signed)
[facility] [address] [place], [place], [address] Phone: [phone]   Fax:  [phone]  Occupational Therapy Evaluation  Patient Details  Name: Angel Shelton MRN: [id number] Date of Birth: [birth year] Referring Provider (OT): Dr. Marton Redwood, Brooke Bonito   Encounter Date: 07/02/2021   OT End of Session - 07/02/21 1151     Visit Number 1    Number of Visits 3    Date for OT Re-Evaluation 07/20/21    Authorization Type BC/BS MCR    OT Start Time 1110    OT Stop Time 1145    OT Time Calculation (min) 35 min    Activity Tolerance Patient tolerated treatment well    Behavior During Therapy WFL for tasks assessed/performed             Past Medical History:  Diagnosis Date   Alcohol abuse    Diabetes mellitus without complication ([place])    Diverticulosis    GERD (gastroesophageal reflux disease)    Hyperlipidemia    Hypertension    Left atrial enlargement    Neuromuscular disorder ([place])    neuropathy   Persistent atrial fibrillation (HCC)    CHA2DS2-VASc score 4 (age-65, HTN-1, DM 2-1, aortic plaque)    Past Surgical History:  Procedure Laterality Date   APPENDECTOMY     CARDIAC CATHETERIZATION N/A 01/05/2015   Procedure: Left Heart Cath and Coronary Angiography;  Surgeon: Pixie Casino, MD;  Location: [facility];  Service: Cardiovascular;; (For Abnormal Nuclear Stress Test) mild luminal irregularities of the LCx with mild areas of ectasia.  Calcified ostial D1 mild stenosis. ->  Mild/nonobstructive CAD.   CARDIOVERSION N/A 11/01/2014   Procedure: CARDIOVERSION;  Surgeon: Pixie Casino, MD;  Location: [facility] ENDOSCOPY;  Service: Cardiovascular;  Laterality: N/A;   CARDIOVERSION N/A 02/24/2015   Procedure: CARDIOVERSION;  Surgeon: Pixie Casino, MD;  Location: [facility] ENDOSCOPY;  Service: Cardiovascular;  Laterality: N/A;   CATARACT EXTRACTION Bilateral    COLON SURGERY N/A 2006   14 inches removed from  diverticitis perforation   COLONOSCOPY     Per stoma   EYE SURGERY Bilateral    Cat Sx   INNER EAR SURGERY     as a child   NM MYOVIEW LTD  12/08/2014   FALSE POSITIVE: Read asINTERMEDIATE RISK.  Partially reversible medium size moderate severity defect in the basal--mid-apical inferoseptal-inferior and apical walls.  Not gated due to A. fib. ->  Referred for cath- NON-OBSTRUCTIVE CAD   ROBOTIC ASSITED PARTIAL NEPHRECTOMY Right 05/23/2021   Procedure: XI RETROPERITONEAL ROBOTIC ASSITED PARTIAL NEPHRECTOMY;  Surgeon: Alexis Frock, MD;  [facility];  Service: Urology;  Laterality: Right;   TEE WITHOUT CARDIOVERSION N/A 11/01/2014   Procedure: TRANSESOPHAGEAL ECHOCARDIOGRAM (TEE) -unsuccesful DCCV;  Surgeon: Pixie Casino, MD;  Location: [facility];  Service: Cardiovascular;; Mild concentric LVH.  EF 55 to 60%.  Normal wall motion.  Grade 3 aortic atheroma.  No source of cardiac emboli.  -->  Followed by unsuccessful DCCV.    There were no vitals filed for this visit.   Subjective Assessment - 07/02/21 1114     Subjective  Pt reports old injury to Rt forearm (nerve damage) when pt was in his late 30's decreasing dexterity Rt hand and with some ulnar n. clawing    Pertinent History history of persistent atrial fibrillation, hypertension, diabetes, recent partial nephrectomy for right-sided neoplasm, admitted to the hospital with a several day history of right facial  droop and dysarthria.  Found to have ischemic stroke Rt frontal lobe 06/01/21.    Limitations None    Patient Stated Goals be able to do buttons and loop belt    Currently in Pain? No/denies               [facility] OT Assessment - 07/02/21 0001       Assessment   Medical Diagnosis CVA    Referring Provider (OT) Dr. Marton Redwood, Jr    Onset Date/Surgical Date 05/23/21   returned to hospital on 06/01/21   Hand Dominance Right    Prior Therapy currently seeing speech therapy      Precautions   Precautions None       Balance Screen   Has the patient fallen in the past 6 months No    Has the patient had a decrease in activity level because of a fear of falling?  No    Is the patient reluctant to leave their home because of a fear of falling?  No      Home  Environment   Lives With Alone      Prior Function   Level of Independence Independent    Vocation --   currently on short term disability   Vocation Requirements sell apartments, in charge of purchasing   pt considering retirement   Leisure projects around the house      ADL   Eating/Feeding Independent    Grooming Independent    Scientist, clinical (histocompatibility and immunogenetics) Independent    Lower Body Bathing Independent    Upper Body Dressing Increased time   difficulty w/ smaller buttons   Lower Body Dressing Modified independent    Patent examiner -  Product/process development scientist Independent      IADL   Shopping Takes care of all shopping needs independently    Light Housekeeping --   Does it all   Meal Prep Plans, prepares and serves adequate meals independently    Programmer, applications own vehicle    Medication Management Is responsible for taking medication in correct dosages at correct time    Physiological scientist financial matters independently (budgets, writes checks, pays rent, bills goes to bank), collects and keeps track of income      Mobility   Mobility Status Independent      Written Expression   Dominant Hand Right    Handwriting --   no changes from stroke     Vision - History   Baseline Vision Wears glasses only for reading    Visual History --   beginning glaucoma     Vision Assessment   Comment denies changes from stroke      Cognition   Overall Cognitive Status Within Functional Limits for tasks assessed      Observation/Other Assessments   Observations ulnar clawing Rt hand from old forearm injury many years ago      Sensation   Additional Comments diminished along ulnar n.  distribution in hand      Coordination   9 Hole Peg Test Right;Left    Right 9 Hole Peg Test 58 sec (slower d/t old injury)    Left 9 Hole Peg Test 47 sec (unsure why pt having difficulty w/ Lt hand)     Edema   Edema none in UE's      ROM / Strength   AROM / PROM / Strength AROM;Strength  AROM   Overall AROM Comments BUE AROM WNL's except: ulnar clawing Rt hand and some limitations Rt thumb from old injuries      Strength   Overall Strength Comments BUE MMT grossly 5/5      Hand Function   Right Hand Grip (lbs) 73.8 lbs    Right Hand Lateral Pinch 10 lbs    Right Hand 3 Point Pinch 10 lbs    Left Hand Grip (lbs) 74.2 lbs    Left Hand Lateral Pinch 16 lbs    Left 3 point pinch 16 lbs    Comment Rt pinch strength weaker due to old thumb injury                                OT Short Term Goals - 07/02/21 1156       OT SHORT TERM GOAL #1   Title STG's = LTG's               OT Long Term Goals - 07/02/21 1156       OT LONG TERM GOAL #1   Title Independent with coordination HEP bilaterally    Time 2    Period Weeks    Status New      OT LONG TERM GOAL #2   Title Pt to verbalize understanding of potential A/E needs (button hook, etc)    Time 2    Period Weeks    Status New                   Plan - 07/02/21 1152     Clinical Impression Statement Pt is a 66 year old male with a history of persistent atrial fibrillation, hypertension, diabetes, recent partial nephrectomy for right-sided neoplasm, admitted to the hospital with a several day history of right facial droop and dysarthria.  Found to have ischemic stroke Rt frontal lobe 06/01/21. Pt also with old injury to Rt forearm causing ulnar n. damage Rt hand (over 30 years ago) and old Rt thumb injury (almost amputated). Pt would benefit from 1-2 visits of O.T. to address coordination bilateral hands and any potential A/E needs.    OT Occupational Profile and History Problem  Focused Assessment - Including review of records relating to presenting problem    Occupational performance deficits (Please refer to evaluation for details): ADL's    Body Structure / Function / Physical Skills Coordination;FMC;UE functional use;Strength    Rehab Potential Good    Clinical Decision Making Several treatment options, min-mod task modification necessary    Comorbidities Affecting Occupational Performance: Presence of comorbidities impacting occupational performance    Comorbidities impacting occupational performance description: Ulnar n. damage Rt hand, thumb impairments Rt hand    Modification or Assistance to Complete Evaluation  No modification of tasks or assist necessary to complete eval    OT Frequency 2x / week    OT Duration 2 weeks   anticipate only 1 week (or 1x/wk for 2 weeks depending on scheduling conflicts)   OT Treatment/Interventions DME and/or AE instruction;Therapeutic activities;Patient/family education    Plan issue coordination HEP for bilateral hands, show button hook - possible d/c next visit             Patient will benefit from skilled therapeutic intervention in order to improve the following deficits and impairments:   Body Structure / Function / Physical Skills: Coordination, [place], UE functional use, Strength  Visit Diagnosis: Other lack of coordination    Problem List Patient Active Problem List   Diagnosis Date Noted   Stroke ([place]) 06/02/2021   Ischemic stroke of frontal lobe ([place]) 06/01/2021   Acute kidney injury superimposed on CKD ([place]) 06/01/2021   Renal mass 05/23/2021   Aortic atherosclerosis ([place]) 04/10/2021   Preop cardiovascular exam 02/09/2021   Permanent atrial fibrillation ([place]) -CHA2DS2-VASc score 4 11/24/2014   Encounter for cardioversion procedure    DM2 (diabetes mellitus, type 2) ([place]) 09/29/2014   Essential hypertension 09/29/2014   Dyslipidemia associated with type 2 diabetes mellitus ([place]) 09/29/2014     Carey Bullocks, OTR/L 07/02/2021, 11:58 AM  [facility] [address] [place], [place], [address] Phone: [phone]   Fax:  [phone]  Name: Angel Shelton MRN: [id number] Date of Birth: May 31, 1955

## 2021-07-04 ENCOUNTER — Encounter: Payer: Self-pay | Admitting: Speech Pathology

## 2021-07-04 ENCOUNTER — Ambulatory Visit: Payer: Medicare Other | Admitting: Speech Pathology

## 2021-07-04 ENCOUNTER — Other Ambulatory Visit: Payer: Self-pay

## 2021-07-04 DIAGNOSIS — R471 Dysarthria and anarthria: Secondary | ICD-10-CM | POA: Diagnosis not present

## 2021-07-04 DIAGNOSIS — R278 Other lack of coordination: Secondary | ICD-10-CM | POA: Diagnosis not present

## 2021-07-04 NOTE — Therapy (Signed)
[facility] [address] [place], [place], [address] Phone: [phone]   Fax:  [phone]  Speech Language Pathology Treatment & Discharge Summary  Patient Details  Name: Angel Shelton MRN: [id number] Date of Birth: 17-May-1956 Referring Provider (SLP): Dr. Erick Blinks (hospitalist) Dr. Pearlean Brownie (new pt to be seen 07/30/21)   Encounter Date: 07/04/2021   End of Session - 07/04/21 1159     Visit Number 7    Number of Visits 17    Date for SLP Re-Evaluation 08/03/21    Authorization Type BCBS medicare    SLP Start Time 1100    SLP Stop Time  1130    SLP Time Calculation (min) 30 min    Activity Tolerance Patient tolerated treatment well             Past Medical History:  Diagnosis Date   Alcohol abuse    Diabetes mellitus without complication (HCC)    Diverticulosis    GERD (gastroesophageal reflux disease)    Hyperlipidemia    Hypertension    Left atrial enlargement    Neuromuscular disorder (HCC)    neuropathy   Persistent atrial fibrillation (HCC)    CHA2DS2-VASc score 4 (age-63, HTN-1, DM 2-1, aortic plaque)    Past Surgical History:  Procedure Laterality Date   APPENDECTOMY     CARDIAC CATHETERIZATION N/A 01/05/2015   Procedure: Left Heart Cath and Coronary Angiography;  Surgeon: Chrystie Nose, MD;  Location: [facility] INVASIVE CV LAB;  Service: Cardiovascular;; (For Abnormal Nuclear Stress Test) mild luminal irregularities of the LCx with mild areas of ectasia.  Calcified ostial D1 mild stenosis. ->  Mild/nonobstructive CAD.   CARDIOVERSION N/A 11/01/2014   Procedure: CARDIOVERSION;  Surgeon: Chrystie Nose, MD;  Location: [facility] ENDOSCOPY;  Service: Cardiovascular;  Laterality: N/A;   CARDIOVERSION N/A 02/24/2015   Procedure: CARDIOVERSION;  Surgeon: Chrystie Nose, MD;  Location: [facility] ENDOSCOPY;  Service: Cardiovascular;  Laterality: N/A;   CATARACT EXTRACTION Bilateral    COLON SURGERY N/A 2006   14  inches removed from diverticitis perforation   COLONOSCOPY     Per stoma   EYE SURGERY Bilateral    Cat Sx   INNER EAR SURGERY     as a child   NM MYOVIEW LTD  12/08/2014   FALSE POSITIVE: Read asINTERMEDIATE RISK.  Partially reversible medium size moderate severity defect in the basal--mid-apical inferoseptal-inferior and apical walls.  Not gated due to A. fib. ->  Referred for cath- NON-OBSTRUCTIVE CAD   ROBOTIC ASSITED PARTIAL NEPHRECTOMY Right 05/23/2021   Procedure: XI RETROPERITONEAL ROBOTIC ASSITED PARTIAL NEPHRECTOMY;  Surgeon: Sebastian Ache, MD;  Location: [facility] ORS;  Service: Urology;  Laterality: Right;   TEE WITHOUT CARDIOVERSION N/A 11/01/2014   Procedure: TRANSESOPHAGEAL ECHOCARDIOGRAM (TEE) -unsuccesful DCCV;  Surgeon: Chrystie Nose, MD;  Location: [facility] ENDOSCOPY;  Service: Cardiovascular;; Mild concentric LVH.  EF 55 to 60%.  Normal wall motion.  Grade 3 aortic atheroma.  No source of cardiac emboli.  -->  Followed by unsuccessful DCCV.    There were no vitals filed for this visit.   Subjective Assessment - 07/04/21 1107     Subjective "She wanted me to do it faster, I think"    Currently in Pain? No/denies                   ADULT SLP TREATMENT - 07/04/21 1108       General Information   Behavior/Cognition Alert;Cooperative;Pleasant mood  Treatment Provided   Treatment provided Cognitive-Linquistic      Cognitive-Linquistic Treatment   Treatment focused on Dysarthria;Patient/family/caregiver education    Skilled Treatment [name] reports his speech is good early in the day when is better able to slow down and carryove strategies for intelligllbity. He is able to self correct when asked for repetition. He reports 75% less drool subjectively and states he is swallowoing more. He improved his score on Communicative Effectiveness Survey on conversing with a stranger on the telephone and remained the same on talking when upset or angry. Rick maintained 100%  intelligibility in complex conversation over 18 minute in mildly noisy environemnt.      Assessment / Recommendations / Plan   Plan Discharge SLP treatment due to (comment)      Progression Toward Goals   Progression toward goals Goals met, education completed, patient discharged from SLP              SLP Education - 07/04/21 1156     Education Details Continue HEP for speech    Person(s) Educated Patient    Methods Explanation;Handout    Comprehension Verbalized understanding;Returned demonstration            SPEECH THERAPY DISCHARGE SUMMARY  Visits from Start of Care: 7  Current functional level related to goals / functional outcomes: See goals below   Remaining deficits: Mild dysarthria with fatigue   Education / Equipment: HEP for dysarthria, drool management, compensations for dysarthria   Patient agrees to discharge. Patient goals were met. Patient is being discharged due to meeting the stated rehab goals.Marland Kitchen       SLP Long Term Goals - 07/04/21 1111       SLP LONG TERM GOAL #1   Title Pt will complete HEP for dysarthria with mod I    Time 3    Period Weeks    Status Achieved      SLP LONG TERM GOAL #2   Title Pt will be 100% intelligible mildly complex conversation with mod I    Time 3    Period Weeks    Status Achieved      SLP LONG TERM GOAL #3   Title Pt will be 100% intelligible over 15 minute conversation in noisy environment    Time 3    Period Weeks    Status Achieved      SLP LONG TERM GOAL #4   Title Pt will report 3 or less requests for repetition on phone calls over 1 week    Time 3    Period Weeks    Status Achieved      SLP LONG TERM GOAL #5   Title Pt will improve score on Communicative Effectiveness Surgey by 2 points    Baseline 23 initial score    Time 3    Period Weeks    Status Achieved      SLP LONG TERM GOAL #6   Title Pt will report 50% reduction in drool subjectively    Time 3    Period Weeks    Status  Achieved              Plan - 07/04/21 1157     Clinical Impression Statement Mild dysarthria improved, perists with fatigue later in the day. Drool improved 75% per pt report. [name] carries over compensatory strategies to be 100% intelligible in noisy environment. He reports improved success with phone calls. HEP completed with mod I. Goals met, d/c ST - pt  is in agreement    Speech Therapy Frequency 2x / week    Duration --   6 weeks   Treatment/Interventions Aspiration precaution training;Environmental controls;SLP instruction and feedback;Oral motor exercises;Cueing hierarchy;Compensatory strategies;Functional tasks;Cognitive reorganization;Patient/family education;Multimodal communcation approach;Internal/external aids;Diet toleration management by SLP    Potential to Achieve Goals Good             Patient will benefit from skilled therapeutic intervention in order to improve the following deficits and impairments:   Dysarthria and anarthria    Problem List Patient Active Problem List   Diagnosis Date Noted   Stroke ([place]) 06/02/2021   Ischemic stroke of frontal lobe ([place]) 06/01/2021   Acute kidney injury superimposed on CKD ([place]) 06/01/2021   Renal mass 05/23/2021   Aortic atherosclerosis ([place]) 04/10/2021   Preop cardiovascular exam 02/09/2021   Permanent atrial fibrillation ([place]) -CHA2DS2-VASc score 4 11/24/2014   Encounter for cardioversion procedure    DM2 (diabetes mellitus, type 2) ([place]) 09/29/2014   Essential hypertension 09/29/2014   Dyslipidemia associated with type 2 diabetes mellitus ([place]) 09/29/2014    Lindsi Bayliss, Annye Rusk, [place] 07/04/2021, 12:00 PM  [facility] [address] [place], [place], [address] Phone: [phone]   Fax:  [phone]   Name: ARCHIE SHEA MRN: [id number] Date of Birth: 06-20-1955

## 2021-07-04 NOTE — Patient Instructions (Signed)
° °  Continue to make phone calls when you are at your best speech (morning, early in the day or after a rest)  Continue to do your exercises (speech and face - in the mirror)  Great job - you sound good and you have worked hard - keep up the good work

## 2021-07-11 ENCOUNTER — Ambulatory Visit: Payer: Medicare Other | Admitting: Occupational Therapy

## 2021-07-18 ENCOUNTER — Encounter: Payer: Medicare Other | Admitting: Occupational Therapy

## 2021-07-24 DIAGNOSIS — E1129 Type 2 diabetes mellitus with other diabetic kidney complication: Secondary | ICD-10-CM | POA: Diagnosis not present

## 2021-07-24 DIAGNOSIS — I10 Essential (primary) hypertension: Secondary | ICD-10-CM | POA: Diagnosis not present

## 2021-07-24 DIAGNOSIS — N1831 Chronic kidney disease, stage 3a: Secondary | ICD-10-CM | POA: Diagnosis not present

## 2021-07-24 DIAGNOSIS — E785 Hyperlipidemia, unspecified: Secondary | ICD-10-CM | POA: Diagnosis not present

## 2021-07-25 DIAGNOSIS — E119 Type 2 diabetes mellitus without complications: Secondary | ICD-10-CM | POA: Diagnosis not present

## 2021-07-25 DIAGNOSIS — H4089 Other specified glaucoma: Secondary | ICD-10-CM | POA: Diagnosis not present

## 2021-08-02 ENCOUNTER — Ambulatory Visit: Payer: Medicare Other | Admitting: Neurology

## 2021-08-15 DIAGNOSIS — H4089 Other specified glaucoma: Secondary | ICD-10-CM | POA: Diagnosis not present

## 2021-10-01 ENCOUNTER — Inpatient Hospital Stay (HOSPITAL_COMMUNITY)
Admission: EM | Admit: 2021-10-01 | Discharge: 2021-10-04 | DRG: 378 | Disposition: A | Discharge status: Home / Self Care | Payer: Medicare Other | Attending: Family Medicine | Admitting: Family Medicine

## 2021-10-01 ENCOUNTER — Encounter (HOSPITAL_COMMUNITY): Payer: Self-pay | Admitting: Emergency Medicine

## 2021-10-01 DIAGNOSIS — I9589 Other hypotension: Secondary | ICD-10-CM | POA: Diagnosis not present

## 2021-10-01 DIAGNOSIS — E876 Hypokalemia: Secondary | ICD-10-CM

## 2021-10-01 DIAGNOSIS — E871 Hypo-osmolality and hyponatremia: Secondary | ICD-10-CM

## 2021-10-01 DIAGNOSIS — Z7984 Long term (current) use of oral hypoglycemic drugs: Secondary | ICD-10-CM

## 2021-10-01 DIAGNOSIS — E119 Type 2 diabetes mellitus without complications: Secondary | ICD-10-CM

## 2021-10-01 DIAGNOSIS — E875 Hyperkalemia: Secondary | ICD-10-CM | POA: Diagnosis present

## 2021-10-01 DIAGNOSIS — K269 Duodenal ulcer, unspecified as acute or chronic, without hemorrhage or perforation: Secondary | ICD-10-CM | POA: Diagnosis not present

## 2021-10-01 DIAGNOSIS — E785 Hyperlipidemia, unspecified: Secondary | ICD-10-CM | POA: Diagnosis present

## 2021-10-01 DIAGNOSIS — E1165 Type 2 diabetes mellitus with hyperglycemia: Secondary | ICD-10-CM | POA: Diagnosis present

## 2021-10-01 DIAGNOSIS — E1140 Type 2 diabetes mellitus with diabetic neuropathy, unspecified: Secondary | ICD-10-CM | POA: Diagnosis not present

## 2021-10-01 DIAGNOSIS — Z8673 Personal history of transient ischemic attack (TIA), and cerebral infarction without residual deficits: Secondary | ICD-10-CM

## 2021-10-01 DIAGNOSIS — I10 Essential (primary) hypertension: Secondary | ICD-10-CM | POA: Diagnosis not present

## 2021-10-01 DIAGNOSIS — K921 Melena: Secondary | ICD-10-CM | POA: Diagnosis not present

## 2021-10-01 DIAGNOSIS — Z7901 Long term (current) use of anticoagulants: Secondary | ICD-10-CM

## 2021-10-01 DIAGNOSIS — Z9049 Acquired absence of other specified parts of digestive tract: Secondary | ICD-10-CM | POA: Diagnosis not present

## 2021-10-01 DIAGNOSIS — K922 Gastrointestinal hemorrhage, unspecified: Secondary | ICD-10-CM

## 2021-10-01 DIAGNOSIS — N179 Acute kidney failure, unspecified: Secondary | ICD-10-CM | POA: Diagnosis not present

## 2021-10-01 DIAGNOSIS — E1169 Type 2 diabetes mellitus with other specified complication: Secondary | ICD-10-CM | POA: Diagnosis not present

## 2021-10-01 DIAGNOSIS — Z823 Family history of stroke: Secondary | ICD-10-CM

## 2021-10-01 DIAGNOSIS — Z87891 Personal history of nicotine dependence: Secondary | ICD-10-CM

## 2021-10-01 DIAGNOSIS — K264 Chronic or unspecified duodenal ulcer with hemorrhage: Principal | ICD-10-CM | POA: Diagnosis present

## 2021-10-01 DIAGNOSIS — Z8249 Family history of ischemic heart disease and other diseases of the circulatory system: Secondary | ICD-10-CM

## 2021-10-01 DIAGNOSIS — K319 Disease of stomach and duodenum, unspecified: Secondary | ICD-10-CM | POA: Diagnosis not present

## 2021-10-01 DIAGNOSIS — I959 Hypotension, unspecified: Secondary | ICD-10-CM | POA: Diagnosis not present

## 2021-10-01 DIAGNOSIS — K760 Fatty (change of) liver, not elsewhere classified: Secondary | ICD-10-CM | POA: Diagnosis not present

## 2021-10-01 DIAGNOSIS — Z85528 Personal history of other malignant neoplasm of kidney: Secondary | ICD-10-CM | POA: Diagnosis not present

## 2021-10-01 DIAGNOSIS — E1122 Type 2 diabetes mellitus with diabetic chronic kidney disease: Secondary | ICD-10-CM | POA: Diagnosis present

## 2021-10-01 DIAGNOSIS — I4821 Permanent atrial fibrillation: Secondary | ICD-10-CM | POA: Diagnosis not present

## 2021-10-01 DIAGNOSIS — Z833 Family history of diabetes mellitus: Secondary | ICD-10-CM

## 2021-10-01 DIAGNOSIS — D509 Iron deficiency anemia, unspecified: Secondary | ICD-10-CM | POA: Diagnosis present

## 2021-10-01 DIAGNOSIS — E11319 Type 2 diabetes mellitus with unspecified diabetic retinopathy without macular edema: Secondary | ICD-10-CM | POA: Diagnosis not present

## 2021-10-01 DIAGNOSIS — K222 Esophageal obstruction: Secondary | ICD-10-CM | POA: Diagnosis not present

## 2021-10-01 DIAGNOSIS — K8020 Calculus of gallbladder without cholecystitis without obstruction: Secondary | ICD-10-CM | POA: Diagnosis not present

## 2021-10-01 DIAGNOSIS — K2951 Unspecified chronic gastritis with bleeding: Secondary | ICD-10-CM | POA: Diagnosis not present

## 2021-10-01 DIAGNOSIS — K219 Gastro-esophageal reflux disease without esophagitis: Secondary | ICD-10-CM | POA: Diagnosis present

## 2021-10-01 DIAGNOSIS — Z79899 Other long term (current) drug therapy: Secondary | ICD-10-CM

## 2021-10-01 DIAGNOSIS — E861 Hypovolemia: Secondary | ICD-10-CM | POA: Diagnosis present

## 2021-10-01 DIAGNOSIS — K449 Diaphragmatic hernia without obstruction or gangrene: Secondary | ICD-10-CM | POA: Diagnosis not present

## 2021-10-01 DIAGNOSIS — D649 Anemia, unspecified: Principal | ICD-10-CM

## 2021-10-01 DIAGNOSIS — Z905 Acquired absence of kidney: Secondary | ICD-10-CM | POA: Diagnosis not present

## 2021-10-01 DIAGNOSIS — D689 Coagulation defect, unspecified: Secondary | ICD-10-CM | POA: Diagnosis not present

## 2021-10-01 DIAGNOSIS — Z7982 Long term (current) use of aspirin: Secondary | ICD-10-CM

## 2021-10-01 DIAGNOSIS — F1010 Alcohol abuse, uncomplicated: Secondary | ICD-10-CM | POA: Diagnosis present

## 2021-10-01 DIAGNOSIS — D62 Acute posthemorrhagic anemia: Secondary | ICD-10-CM | POA: Diagnosis not present

## 2021-10-01 HISTORY — DX: Gastrointestinal hemorrhage, unspecified: K92.2

## 2021-10-01 HISTORY — DX: Cerebral infarction, unspecified: I63.9

## 2021-10-01 LAB — COMPREHENSIVE METABOLIC PANEL
ALT: 11 U/L (ref 0–44)
AST: 15 U/L (ref 15–41)
Albumin: 3.5 g/dL (ref 3.5–5.0)
Alkaline Phosphatase: 26 U/L — ABNORMAL LOW (ref 38–126)
Anion gap: 13 (ref 5–15)
BUN: 31 mg/dL — ABNORMAL HIGH (ref 8–23)
CO2: 22 mmol/L (ref 22–32)
Calcium: 9.2 mg/dL (ref 8.9–10.3)
Chloride: 93 mmol/L — ABNORMAL LOW (ref 98–111)
Creatinine, Ser: 2.2 mg/dL — ABNORMAL HIGH (ref 0.61–1.24)
GFR, Estimated: 32 mL/min — ABNORMAL LOW (ref >60)
Glucose, Bld: 284 mg/dL — ABNORMAL HIGH (ref 70–99)
Potassium: 5.7 mmol/L — ABNORMAL HIGH (ref 3.5–5.1)
Sodium: 128 mmol/L — ABNORMAL LOW (ref 135–145)
Total Bilirubin: 0.8 mg/dL (ref 0.3–1.2)
Total Protein: 6.1 g/dL — ABNORMAL LOW (ref 6.5–8.1)

## 2021-10-01 LAB — CBC WITH DIFFERENTIAL/PLATELET
Abs Immature Granulocytes: 0.06 10*3/uL (ref 0.00–0.07)
Basophils Absolute: 0.1 10*3/uL (ref 0.0–0.1)
Basophils Relative: 1 %
Eosinophils Absolute: 0.2 10*3/uL (ref 0.0–0.5)
Eosinophils Relative: 3 %
HCT: 19.3 % — ABNORMAL LOW (ref 39.0–52.0)
Hemoglobin: 6.1 g/dL — CL (ref 13.0–17.0)
Immature Granulocytes: 1 %
Lymphocytes Relative: 16 %
Lymphs Abs: 1.2 10*3/uL (ref 0.7–4.0)
MCH: 28 pg (ref 26.0–34.0)
MCHC: 31.6 g/dL (ref 30.0–36.0)
MCV: 88.5 fL (ref 80.0–100.0)
Monocytes Absolute: 1 10*3/uL (ref 0.1–1.0)
Monocytes Relative: 13 %
Neutro Abs: 5 10*3/uL (ref 1.7–7.7)
Neutrophils Relative %: 66 %
Platelets: 323 10*3/uL (ref 150–400)
RBC: 2.18 MIL/uL — ABNORMAL LOW (ref 4.22–5.81)
RDW: 15.4 % (ref 11.5–15.5)
WBC: 7.6 10*3/uL (ref 4.0–10.5)
nRBC: 0 % (ref 0.0–0.2)

## 2021-10-01 LAB — I-STAT CHEM 8, ED
BUN: 32 mg/dL — ABNORMAL HIGH (ref 8–23)
Calcium, Ion: 1.16 mmol/L (ref 1.15–1.40)
Chloride: 93 mmol/L — ABNORMAL LOW (ref 98–111)
Creatinine, Ser: 2.4 mg/dL — ABNORMAL HIGH (ref 0.61–1.24)
Glucose, Bld: 292 mg/dL — ABNORMAL HIGH (ref 70–99)
HCT: 19 % — ABNORMAL LOW (ref 39.0–52.0)
Hemoglobin: 6.5 g/dL — CL (ref 13.0–17.0)
Potassium: 5.7 mmol/L — ABNORMAL HIGH (ref 3.5–5.1)
Sodium: 126 mmol/L — ABNORMAL LOW (ref 135–145)
TCO2: 24 mmol/L (ref 22–32)

## 2021-10-01 LAB — FOLATE: Folate: 9.1 ng/mL (ref >5.9)

## 2021-10-01 LAB — RETICULOCYTES
Immature Retic Fract: 29.6 % — ABNORMAL HIGH (ref 2.3–15.9)
RBC.: 2.26 MIL/uL — ABNORMAL LOW (ref 4.22–5.81)
Retic Count, Absolute: 85.9 10*3/uL (ref 19.0–186.0)
Retic Ct Pct: 3.8 % — ABNORMAL HIGH (ref 0.4–3.1)

## 2021-10-01 LAB — IRON AND TIBC
Iron: 15 ug/dL — ABNORMAL LOW (ref 45–182)
Saturation Ratios: 3 % — ABNORMAL LOW (ref 17.9–39.5)
TIBC: 568 ug/dL — ABNORMAL HIGH (ref 250–450)
UIBC: 553 ug/dL

## 2021-10-01 LAB — HEMOGLOBIN AND HEMATOCRIT, BLOOD
HCT: 17 % — ABNORMAL LOW (ref 39.0–52.0)
Hemoglobin: 5.4 g/dL — CL (ref 13.0–17.0)

## 2021-10-01 LAB — PROTIME-INR
INR: 1.5 — ABNORMAL HIGH (ref 0.8–1.2)
Prothrombin Time: 18.2 seconds — ABNORMAL HIGH (ref 11.4–15.2)

## 2021-10-01 LAB — VITAMIN B12: Vitamin B-12: 1634 pg/mL — ABNORMAL HIGH (ref 180–914)

## 2021-10-01 LAB — POC OCCULT BLOOD, ED: Fecal Occult Bld: POSITIVE — AB

## 2021-10-01 LAB — PREPARE RBC (CROSSMATCH)

## 2021-10-01 LAB — FERRITIN: Ferritin: 10 ng/mL — ABNORMAL LOW (ref 24–336)

## 2021-10-01 LAB — CBG MONITORING, ED: Glucose-Capillary: 236 mg/dL — ABNORMAL HIGH (ref 70–99)

## 2021-10-01 MED ORDER — SODIUM CHLORIDE 0.9 % IV BOLUS
1000.0000 mL | Freq: Once | INTRAVENOUS | Status: AC
  Administered 2021-10-01: 1000 mL via INTRAVENOUS

## 2021-10-01 MED ORDER — FENOFIBRATE 160 MG PO TABS
160.0000 mg | ORAL_TABLET | Freq: Every day | ORAL | Status: DC
  Administered 2021-10-02 – 2021-10-04 (×3): 160 mg via ORAL
  Filled 2021-10-01 (×3): qty 1

## 2021-10-01 MED ORDER — ROSUVASTATIN CALCIUM 20 MG PO TABS
40.0000 mg | ORAL_TABLET | Freq: Every day | ORAL | Status: DC
  Administered 2021-10-02 – 2021-10-04 (×3): 40 mg via ORAL
  Filled 2021-10-01 (×3): qty 2

## 2021-10-01 MED ORDER — SODIUM CHLORIDE 0.9% IV SOLUTION: Freq: Once | INTRAVENOUS | Status: AC

## 2021-10-01 MED ORDER — PANTOPRAZOLE SODIUM 40 MG IV SOLR
40.0000 mg | Freq: Two times a day (BID) | INTRAVENOUS | Status: DC
  Administered 2021-10-01 – 2021-10-03 (×4): 40 mg via INTRAVENOUS
  Filled 2021-10-01 (×4): qty 10

## 2021-10-01 MED ORDER — SODIUM CHLORIDE 0.9 % IV SOLN
10.0000 mL/h | Freq: Once | INTRAVENOUS | Status: AC
  Administered 2021-10-01: 10 mL/h via INTRAVENOUS

## 2021-10-01 MED ORDER — INSULIN ASPART 100 UNIT/ML IJ SOLN
0.0000 [IU] | Freq: Four times a day (QID) | INTRAMUSCULAR | Status: DC
  Administered 2021-10-01 – 2021-10-02 (×3): 3 [IU] via SUBCUTANEOUS

## 2021-10-01 NOTE — H&P (Addendum)
History and Physical    Patient: Angel Shelton:[id number] DOB: July 22, 1955 DOA: 10/01/2021 DOS: the patient was seen and examined on 10/02/2021 PCP: Cleatis Polka., MD  Patient coming from: Home  Chief Complaint:  Chief Complaint  Patient presents with   Abnormal Lab   HPI: Angel Shelton is a 66 y.o. 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  He reports for the past week he has noticed dark stool and has progressively felt weak and dizzy.  Has upper abdominal discomfort and bloating. He presented to his PCP for routine follow-up and mention these symptoms and was advise to present to ED.  He recently had a right frontal parenchymal hemorrhagic conversion of ischemic stroke in January and was enrolled in the ASPIRE clinical trial sponsored by [employer] to assess Eliquis vs Aspirin following intracerebral hemorrhage in pt with A.fib. This is a blinded study so he does not know what medication he has been taking. Reports getting 2 pills in the morning and 1 at night. Last dose this morning He otherwise denies NSAIDS. Drinks 3 glass of bourbons nightly. No hx of GI bleed. Has hx of colon resection s/p diverticulitis but thinks last EGD in 2006 but I am unable to see records.    In the ED, BP are soft in 99/58 with bradycardia in the 50s. Hgb of 6.1 from 12.9 four months ago. FOBT was positive with dark stool per EDP.  Na of 126, K of 5.7, Cl of 93, CO232 and creatinine of 2.40. CBG of 292.   EDP consulted [facility] Dr. Russella Dar who will following in consultation tomorrow. He was given 1L NS and stated on PPI 40mg  q12hr.  Hospitalist called for admission.  Review of Systems: unable to review all systems due to the inability of the patient to answer questions. Past Medical  History:  Diagnosis Date   Alcohol abuse    Diabetes mellitus without complication (HCC)    Diverticulosis    GERD (gastroesophageal reflux disease)    Hyperlipidemia    Hypertension    Left atrial enlargement    Neuromuscular disorder (HCC)    neuropathy   Persistent atrial fibrillation (HCC)    CHA2DS2-VASc score 4 (age-73, HTN-1, DM 2-1, aortic plaque)   Stroke [facility])    Past Surgical History:  Procedure Laterality Date   APPENDECTOMY     CARDIAC CATHETERIZATION N/A 01/05/2015   Procedure: Left Heart Cath and Coronary Angiography;  Surgeon: Chrystie Nose, MD;  Location: [facility] INVASIVE CV LAB;  Service: Cardiovascular;; (For Abnormal Nuclear Stress Test) mild luminal irregularities of the LCx with mild areas of ectasia.  Calcified ostial D1 mild stenosis. ->  Mild/nonobstructive CAD.   CARDIOVERSION N/A 11/01/2014   Procedure: CARDIOVERSION;  Surgeon: Chrystie Nose, MD;  Location: [facility] ENDOSCOPY;  Service: Cardiovascular;  Laterality: N/A;   CARDIOVERSION N/A 02/24/2015   Procedure: CARDIOVERSION;  Surgeon: Chrystie Nose, MD;  Location: [facility] ENDOSCOPY;  Service: Cardiovascular;  Laterality: N/A;   CATARACT EXTRACTION Bilateral    COLON SURGERY N/A 2006   14 inches removed from diverticitis perforation   COLONOSCOPY     Per stoma   EYE SURGERY Bilateral    Cat Sx   INNER EAR SURGERY     as a child   NM MYOVIEW LTD  12/08/2014   FALSE POSITIVE: Read asINTERMEDIATE  RISK.  Partially reversible medium size moderate severity defect in the basal--mid-apical inferoseptal-inferior and apical walls.  Not gated due to A. fib. ->  Referred for cath- NON-OBSTRUCTIVE CAD   ROBOTIC ASSITED PARTIAL NEPHRECTOMY Right 05/23/2021   Procedure: XI RETROPERITONEAL ROBOTIC ASSITED PARTIAL NEPHRECTOMY;  Surgeon: Sebastian Ache, MD;  Location: [facility] ORS;  Service: Urology;  Laterality: Right;   TEE WITHOUT CARDIOVERSION N/A 11/01/2014   Procedure: TRANSESOPHAGEAL ECHOCARDIOGRAM (TEE) -unsuccesful DCCV;   Surgeon: Chrystie Nose, MD;  Location: [facility] ENDOSCOPY;  Service: Cardiovascular;; Mild concentric LVH.  EF 55 to 60%.  Normal wall motion.  Grade 3 aortic atheroma.  No source of cardiac emboli.  -->  Followed by unsuccessful DCCV.   Social History:  reports that he quit smoking about 7 years ago. His smoking use included cigarettes. He has a 30.00 pack-year smoking history. He has never used smokeless tobacco. He reports current alcohol use of about 14.0 standard drinks per week. He reports that he does not use drugs.  No Known Allergies  Family History  Problem Relation Age of Onset   Heart disease Father    Stroke Father    Diabetes Mother    Diabetes Maternal Grandmother    Stroke Maternal Grandmother    Heart attack Paternal Grandfather    Hodgkin's lymphoma Paternal Aunt    Hodgkin's lymphoma Paternal Uncle    Colon cancer Neg Hx    Esophageal cancer Neg Hx    Pancreatic cancer Neg Hx    Prostate cancer Neg Hx    Rectal cancer Neg Hx    Stomach cancer Neg Hx     Prior to Admission medications   Medication Sig Start Date End Date Taking? Authorizing Provider  aspirin 81 MG chewable tablet Chew 1 tablet (81 mg total) by mouth daily. 06/04/21   Erick Blinks, MD  atorvastatin (LIPITOR) 80 MG tablet Take 1 tablet (80 mg total) by mouth daily. 06/03/21   Erick Blinks, MD  Cyanocobalamin (VITAMIN B-12 PO) Take 1 tablet by mouth daily.    [provider]  diltiazem (CARDIZEM CD) 240 MG 24 hr capsule Take 240 mg by mouth daily. 03/28/21   [provider]  fenofibrate 160 MG tablet Take 160 mg by mouth daily.    [provider]  gabapentin (NEURONTIN) 300 MG capsule Take 300 mg by mouth every 8 (eight) hours. 04/02/21   [provider]  Lactobacillus-Inulin (PROBIOTIC DIGESTIVE SUPPORT PO) Take 1 tablet by mouth daily.    [provider]  lisinopril-hydrochlorothiazide (ZESTORETIC) 20-12.5 MG tablet Take 1 tablet by mouth daily.    [provider]  metFORMIN (GLUCOPHAGE) 1000 MG tablet Take 1,000 mg by mouth 2 (two) times daily. 09/09/19   [provider]  metoprolol succinate (TOPROL-XL) 100 MG 24 hr tablet Take 100 mg by mouth daily. 09/21/14   [provider]  Omega-3 Fatty Acids (FISH OIL) 1000 MG CAPS Take 1,200 mg by mouth 2 (two) times daily.    [provider]  Wheat Dextrin (BENEFIBER DRINK MIX) PACK Take 1 Package by mouth daily.    [provider]    Physical Exam: Vitals:   10/01/21 2114 10/01/21 2328 10/01/21 2358 10/02/21 0017  BP: 107/76 123/68 114/65 115/65  Pulse: 62 66 71 74  Resp: 14 16 13 15   Temp: 98 F (36.7 C) 97.8 F (36.6 C) 98.3 F (36.8 C) 98.1 F (36.7 C)  TempSrc: Oral Oral Oral Oral  SpO2: 100%   100%  Weight:      Height:       Constitutional: NAD, calm, comfortable, elderly male with pallor laying upright in bed Eyes: lids and conjunctivae normal ENMT: Mucous membranes are moist. Neck: normal, supple Respiratory: clear to auscultation bilaterally, no wheezing, no crackles. Normal respiratory effort. No accessory muscle use.  Cardiovascular: Regular rate and rhythm, no murmurs / rubs / gallops. No extremity edema. Abdomen: Soft, non-distended, no tenderness, no masses palpated. Bowel sounds positive.  Musculoskeletal: no clubbing / cyanosis. No joint deformity upper and lower extremities. Good ROM, no contractures. Normal muscle tone.  Skin: no rashes, lesions, ulcers.  Neurologic: CN 2-12 grossly intact. Strength 5/5 in all 4.  Psychiatric: Normal judgment and insight. Alert and oriented x 3. Normal mood. Data Reviewed:  See HPI  Assessment and Plan: * Acute GI bleeding -Hgb of 6.1 from 12.9 four months ago. FOBT was positive with dark stool -Unclear if he really is on anticoagulation or placebo since he is in a blinded clinical trial ASPIRE to evaluation aspirin versus Eliquis following intracerebral hemorrhage patient in with A. Fib.  Will need to touch base with [facility] in the morning to find a contact to discuss with principal investigator of the clinical trial since this will affect whether it is safe for him to remain on any anticoagulation for  his A-fib in the setting of recent history of hemorrhagic stroke and present GI bleed.   -Continue PPI q12hr -Transfuse 2u pRBC and follow post H/H. Hgb goal of at least 7.  - [facility] consulted and will  see tomorrow -unable to locate records of EGD -Colonoscopy in 2018 with scattered large-mouth diverticular in the entire colon. S/p segmental resection for diverticulitis but anastomosis could not be identified.  -Flex sigmoidoscopy in 2019 for rectal hemorrhage with reducible hemorrhoids as source and diverticulosis in sigmoid colon.   Hypotension Presented with soft BP of 90s/50s.  Continue to monitor with blood transfusion Hold home antihypertensives overnight  Hyperkalemia Follow repeat after transfusion Also has received 3 units of insulin with his hyperglycemia  Hyponatremia Mild after correcting for hyperglycemia. Due to hypovolemia. Will follow after transfusion.   AKI (acute kidney injury) (HCC) Creatinine elevated to 2.04 from baseline around 1  -monitor after blood transfusion -avoid nephrotoxic agents  History of CVA (cerebrovascular accident) Recent right frontal intracerebral hemorrhage stroke in January He was enrolled in ASPIRE clinical trial by neurology. This is an NIH sponsored trial of aspirin vs Eliquis following intracerebral hemorrhage patient with A.fib.   Atrial fibrillation, permanent (HCC) -CHA2DS2-VASc score of 5 -Unclear if he really is on anticoagulation or placebo since he is in a blinded clinical trial ASPIRE to evaluation aspirin versus Eliquis following intracerebral hemorrhage patient with A. Fib. Will need to touch base with [facility] in the morning to find a contact to discuss with principal investigator of the  clinical trial since this will affect whether it is safe for him to remain on any anticoagulation or aspirin for his A-fib and history of hemorrhagic stroke.    Dyslipidemia associated with type 2 diabetes mellitus (HCC) Continue statin  DM2 (diabetes mellitus, type 2) (HCC) W/hyperglycemia -Last HgbA1C in January of 6.2%  Place on sensitive SSI     Advance Care Planning:   Code Status: Full Code   Consults: [facility]  Family Communication: no family at bedside   Severity of Illness: The appropriate patient status for this patient is OBSERVATION. Observation status is judged to be reasonable and  necessary in order to provide the required intensity of service to ensure the patient's safety. The patient's presenting symptoms, physical exam findings, and initial radiographic and laboratory data in the context of their medical condition is felt to place them at decreased risk for further clinical deterioration. Furthermore, it is anticipated that the patient will be medically stable for discharge from the hospital within 2 midnights of admission.   Author: Anselm Jungling, DO 10/02/2021 12:41 AM  For on call review [email or web address].

## 2021-10-01 NOTE — Assessment & Plan Note (Addendum)
Follow repeat after transfusion ?Also has received 3 units of insulin with his hyperglycemia ?

## 2021-10-01 NOTE — Assessment & Plan Note (Addendum)
-  Hgb of 6.1 from 12.9 four months ago. FOBT was positive with dark stool ?-Unclear if he really is on anticoagulation or placebo since he is in a blinded clinical trial ASPIRE to evaluation aspirin versus Eliquis following intracerebral hemorrhage patient in with A. Fib. Will need to touch base with [facility] in the morning to find a contact to discuss with principal investigator of the clinical trial since this will affect whether it is safe for him to remain on any anticoagulation for  his A-fib in the setting of recent history of hemorrhagic stroke and present GI bleed.   ?-Continue PPI q12hr ?-Transfuse 2u pRBC and follow post H/H. Hgb goal of at least 7.  ?- [facility] consulted and will  see tomorrow ?-unable to locate records of EGD ?-Colonoscopy in 2018 with scattered large-mouth diverticular in the entire colon. S/p segmental resection for diverticulitis but anastomosis could not be identified.  ?-Flex sigmoidoscopy in 2019 for rectal hemorrhage with reducible hemorrhoids as source and diverticulosis in sigmoid colon.  ?

## 2021-10-01 NOTE — Assessment & Plan Note (Addendum)
Mild after correcting for hyperglycemia. Due to hypovolemia. Will follow after transfusion.  ?

## 2021-10-01 NOTE — Assessment & Plan Note (Signed)
Recent right frontal intracerebral hemorrhage stroke in January ?He was enrolled in ASPIRE clinical trial by neurology. This is an NIH sponsored trial of aspirin vs Eliquis following intracerebral hemorrhage patient with A.fib.  ?

## 2021-10-01 NOTE — ED Provider Notes (Signed)
?[facility] ?Provider Note ? ? ?CSN: [id number] ?Arrival date & time: 10/01/21  1642 ? ?  ? ?History ? ?Chief Complaint  ?Patient presents with  ? Abnormal Lab  ? ? ?Angel Shelton is a 66 y.o. male.  Presented to the emergency department due to concern for abnormal lab value.  Low hemoglobin.  Patient reports that over the past week he has been having dark stools, seemed that the color of the stool slightly improved over the last couple days.  He however over the last few days has had progressive generalized weakness, shortness of breath.  He at times has some upper abdominal discomfort but currently does not have any abdominal discomfort.  Never had any pain.  No vomiting.  Patient reports last dose of his Eliquis was this morning. ? ?Reviewed last discharge summary.  Patient has a past medical history of atrial fibrillation, hypertension, diabetes, recent partial nephrectomy for right-sided neoplasm.  Admitted for ischemic stroke with hemorrhagic conversion.   ? ?HPI ? ?  ? ?Home Medications ?Prior to Admission medications   ?Medication Sig Start Date End Date Taking? Authorizing Provider  ?aspirin 81 MG chewable tablet Chew 1 tablet (81 mg total) by mouth daily. 06/04/21   Kathie Dike, MD  ?atorvastatin (LIPITOR) 80 MG tablet Take 1 tablet (80 mg total) by mouth daily. 06/03/21   Kathie Dike, MD  ?Cyanocobalamin (VITAMIN B-12 PO) Take 1 tablet by mouth daily.    [provider]  ?diltiazem (CARDIZEM CD) 240 MG 24 hr capsule Take 240 mg by mouth daily. 03/28/21   [provider]  ?fenofibrate 160 MG tablet Take 160 mg by mouth daily.    [provider]  ?gabapentin (NEURONTIN) 300 MG capsule Take 300 mg by mouth every 8 (eight) hours. 04/02/21   [provider]  ?Lactobacillus-Inulin (PROBIOTIC DIGESTIVE SUPPORT PO) Take 1 tablet by mouth daily.    [provider]  ?lisinopril-hydrochlorothiazide (ZESTORETIC) 20-12.5 MG tablet  Take 1 tablet by mouth daily.    [provider]  ?metFORMIN (GLUCOPHAGE) 1000 MG tablet Take 1,000 mg by mouth 2 (two) times daily. 09/09/19   [provider]  ?metoprolol succinate (TOPROL-XL) 100 MG 24 hr tablet Take 100 mg by mouth daily. 09/21/14   [provider]  ?Omega-3 Fatty Acids (FISH OIL) 1000 MG CAPS Take 1,200 mg by mouth 2 (two) times daily.    [provider]  ?Wheat Dextrin (BENEFIBER DRINK MIX) PACK Take 1 Package by mouth daily.    [provider]  ?   ? ?Allergies    ?Patient has no known allergies.   ? ?Review of Systems   ?Review of Systems  ?Constitutional:  Positive for fatigue. Negative for chills and fever.  ?HENT:  Negative for ear pain and sore throat.   ?Eyes:  Negative for pain and visual disturbance.  ?Respiratory:  Positive for shortness of breath. Negative for cough.   ?Cardiovascular:  Negative for chest pain and palpitations.  ?Gastrointestinal:  Positive for blood in stool. Negative for abdominal pain and vomiting.  ?Genitourinary:  Negative for dysuria and hematuria.  ?Musculoskeletal:  Negative for arthralgias and back pain.  ?Skin:  Negative for color change and rash.  ?Neurological:  Positive for light-headedness. Negative for seizures and syncope.  ?All other systems reviewed and are negative. ? ?Physical Exam ?Updated Vital Signs ?BP (!) 100/50   Pulse (!) 57   Temp 98.8 ?F (37.1 ?C) (Oral)   Resp Marland Kitchen)  25   Ht '5\' 10"'$  (1.778 m)   Wt 78.9 kg   SpO2 92%   BMI 24.97 kg/m?  ?Physical Exam ?Vitals and nursing note reviewed. Exam conducted with a chaperone present.  ?Constitutional:   ?   General: He is not in acute distress. ?   Appearance: He is well-developed.  ?   Comments: Pale but no distress  ?HENT:  ?   Head: Normocephalic and atraumatic.  ?Eyes:  ?   Conjunctiva/sclera: Conjunctivae normal.  ?Cardiovascular:  ?   Rate and Rhythm: Normal rate and regular rhythm.  ?   Heart sounds: No murmur heard. ?Pulmonary:  ?   Effort:  Pulmonary effort is normal. No respiratory distress.  ?   Breath sounds: Normal breath sounds.  ?Abdominal:  ?   Palpations: Abdomen is soft.  ?   Tenderness: There is no abdominal tenderness.  ?Genitourinary: ?   Comments: Tech chaperone, stool is very dark but not purely black ?Musculoskeletal:     ?   General: No swelling.  ?   Cervical back: Neck supple.  ?Skin: ?   General: Skin is warm and dry.  ?   Capillary Refill: Capillary refill takes less than 2 seconds.  ?Neurological:  ?   Mental Status: He is alert.  ?Psychiatric:     ?   Mood and Affect: Mood normal.  ? ? ?ED Results / Procedures / Treatments   ?Labs ?(all labs ordered are listed, but only abnormal results are displayed) ?Labs Reviewed  ?CBC WITH DIFFERENTIAL/PLATELET - Abnormal; Notable for the following components:  ?    Result Value  ? RBC 2.18 (*)   ? Hemoglobin 6.1 (*)   ? HCT 19.3 (*)   ? All other components within normal limits  ?COMPREHENSIVE METABOLIC PANEL - Abnormal; Notable for the following components:  ? Sodium 128 (*)   ? Potassium 5.7 (*)   ? Chloride 93 (*)   ? Glucose, Bld 284 (*)   ? BUN 31 (*)   ? Creatinine, Ser 2.20 (*)   ? Total Protein 6.1 (*)   ? Alkaline Phosphatase 26 (*)   ? GFR, Estimated 32 (*)   ? All other components within normal limits  ?PROTIME-INR - Abnormal; Notable for the following components:  ? Prothrombin Time 18.2 (*)   ? INR 1.5 (*)   ? All other components within normal limits  ?RETICULOCYTES - Abnormal; Notable for the following components:  ? Retic Ct Pct 3.8 (*)   ? RBC. 2.26 (*)   ? Immature Retic Fract 29.6 (*)   ? All other components within normal limits  ?I-STAT CHEM 8, ED - Abnormal; Notable for the following components:  ? Sodium 126 (*)   ? Potassium 5.7 (*)   ? Chloride 93 (*)   ? BUN 32 (*)   ? Creatinine, Ser 2.40 (*)   ? Glucose, Bld 292 (*)   ? Hemoglobin 6.5 (*)   ? HCT 19.0 (*)   ? All other components within normal limits  ?POC OCCULT BLOOD, ED - Abnormal; Notable for the following  components:  ? Fecal Occult Bld POSITIVE (*)   ? All other components within normal limits  ?VITAMIN B12  ?FOLATE  ?IRON AND TIBC  ?FERRITIN  ?TYPE AND SCREEN  ?PREPARE RBC (CROSSMATCH)  ? ? ?EKG ?None ? ?Radiology ?No results found. ? ?Procedures ?Marland KitchenCritical Care ?Performed by: Lucrezia Starch, MD ?Authorized by: Lucrezia Starch, MD  ? ?Critical care provider  statement:  ?  Critical care time (minutes):  38 ?  Critical care was time spent personally by me on the following activities:  Development of treatment plan with patient or surrogate, discussions with consultants, evaluation of patient's response to treatment, examination of patient, ordering and review of laboratory studies, ordering and review of radiographic studies, ordering and performing treatments and interventions, pulse oximetry, re-evaluation of patient's condition and review of old charts  ? ? ?Medications Ordered in ED ?Medications  ?pantoprazole (PROTONIX) injection 40 mg (40 mg Intravenous Given 10/01/21 1829)  ?insulin aspart (novoLOG) injection 0-9 Units (has no administration in time range)  ?0.9 %  sodium chloride infusion (10 mL/hr Intravenous New Bag/Given 10/01/21 1807)  ?sodium chloride 0.9 % bolus 1,000 mL (1,000 mLs Intravenous New Bag/Given 10/01/21 1806)  ? ? ?ED Course/ Medical Decision Making/ A&P ?Clinical Course as of 10/01/21 1857  ?Mon Oct 01, 2021  ?1819 D/w stark [RD]  ?  ?Clinical Course User Index ?[RD] Lucrezia Starch, MD  ? ?                        ?Medical Decision Making ?Amount and/or Complexity of Data Reviewed ?Labs: ordered. ? ?Risk ?Prescription drug management. ?Decision regarding hospitalization. ? ? ?66 year old gentleman with history of A-fib, stroke on Eliquis presenting for dark stools, anemia.  Blood work here is consistent with anemia, AKI, hyponatremia.  On exam patient is pale but not in distress.  Dark but not frankly black stool noted on exam.  Strong suspicion for GI bleed.  Discussed with Dr. Fuller Plan  on-call.  He recommends IV PPI, given overall clinical appearance, recommends holding further doses of anticoagulation for now but does not feel patient would require reversal agent.  They will see pt likely tomo

## 2021-10-01 NOTE — Assessment & Plan Note (Signed)
Follow repeat after transfusion. ?

## 2021-10-01 NOTE — ED Notes (Signed)
Pt resting in bed on phone, pt denies any transfusion reaction symptoms at this time.  ?

## 2021-10-01 NOTE — Assessment & Plan Note (Signed)
-  CHA2DS2-VASc score of 5 ?-Unclear if he really is on anticoagulation or placebo since he is in a blinded clinical trial [facility] to evaluation aspirin versus Eliquis following intracerebral hemorrhage patient with A. Fib. Will need to touch base with [facility] in the morning to find a contact to discuss with principal investigator of the clinical trial since this will affect whether it is safe for him to remain on any anticoagulation or aspirin for his A-fib and history of hemorrhagic stroke.   ?

## 2021-10-01 NOTE — ED Provider Triage Note (Signed)
Emergency Medicine Provider Triage Evaluation Note ? ?Angel Shelton , a 66 y.o. male  was evaluated in triage.  Pt complains of abnormal labs.  Patient had lab testing done earlier today which showed hemoglobin at 6.3.  Patient reports that he has been dealing with lightheadedness and dark-colored stools over the last week.  Patient does take Eliquis.  Reports that he has been having increased "acid reflux," over the last few days.   ? ?Denies any fever, chills, syncope, chest pain, shortness of breath, blood in stool, hematuria. ? ?Review of Systems  ?Positive: See above ?Negative: See above ? ?Physical Exam  ?BP (!) 99/58   Pulse (!) 55   Temp 98.8 ?F (37.1 ?C) (Oral)   Resp 18   Ht '5\' 10"'$  (1.778 m)   Wt 78.9 kg   SpO2 100%   BMI 24.97 kg/m?  ?Gen:   Awake, no distress   ?Resp:  Normal effort  ?MSK:   Moves extremities without difficulty  ?Other:  Abdomen soft, nondistended, nontender no guarding or rebound tenderness. ? ?Medical Decision Making  ?Medically screening exam initiated at 5:07 PM.  Appropriate orders placed.  Angel Shelton was informed that the remainder of the evaluation will be completed by another provider, this initial triage assessment does not replace that evaluation, and the importance of remaining in the ED until their evaluation is complete. ? ?With hemoglobin 6.3 patient will need transfusion.  We will make patient level 2 ?  ?Loni Beckwith, PA-C ?10/01/21 1712 ? ?

## 2021-10-01 NOTE — ED Notes (Signed)
Updated daughter Ishmael Holter on POC and pt status ?

## 2021-10-01 NOTE — ED Notes (Signed)
Daughter Uzoma Vivona [phone] would like an update asap ?

## 2021-10-01 NOTE — Assessment & Plan Note (Deleted)
-  CHA2DS2-VASc score of 5 ?-Unclear if he really is on anticoagulation or placebo since he is in a blinded clinical trial [facility] to evaluation aspirin versus Eliquis following intracerebral hemorrhage patient with A. Fib. Will need to touch base with [facility] in the morning to find a contact to discuss with principal investigator of the clinical trial since this will affect whether it is safe for him to remain on any anticoagulation or aspirin for his A-fib and history of hemorrhagic stroke.   ?

## 2021-10-01 NOTE — Assessment & Plan Note (Signed)
Creatinine elevated to 2.04 from baseline around 1  ?-monitor after blood transfusion ?-avoid nephrotoxic agents ?

## 2021-10-01 NOTE — Assessment & Plan Note (Signed)
Continue statin. 

## 2021-10-01 NOTE — Assessment & Plan Note (Signed)
Presented with soft BP of 90s/50s.  ?Continue to monitor with blood transfusion ?Hold home antihypertensives overnight ?

## 2021-10-01 NOTE — Assessment & Plan Note (Addendum)
W/hyperglycemia ?-Last HgbA1C in January of 6.2%  ?Place on sensitive SSI  ?

## 2021-10-01 NOTE — ED Triage Notes (Signed)
Pt endorses lightheadedness for a few days. Pt had labs drawn showing hgb 6.3. Pt also having dark stools for about 4 days, on eliquis.  ?

## 2021-10-02 ENCOUNTER — Other Ambulatory Visit: Payer: Self-pay

## 2021-10-02 ENCOUNTER — Encounter (HOSPITAL_COMMUNITY): Payer: Self-pay | Admitting: Family Medicine

## 2021-10-02 ENCOUNTER — Observation Stay (HOSPITAL_COMMUNITY): Payer: Medicare Other

## 2021-10-02 DIAGNOSIS — E861 Hypovolemia: Secondary | ICD-10-CM | POA: Diagnosis present

## 2021-10-02 DIAGNOSIS — K921 Melena: Secondary | ICD-10-CM

## 2021-10-02 DIAGNOSIS — K8020 Calculus of gallbladder without cholecystitis without obstruction: Secondary | ICD-10-CM | POA: Diagnosis not present

## 2021-10-02 DIAGNOSIS — E871 Hypo-osmolality and hyponatremia: Secondary | ICD-10-CM | POA: Diagnosis present

## 2021-10-02 DIAGNOSIS — D62 Acute posthemorrhagic anemia: Secondary | ICD-10-CM | POA: Diagnosis present

## 2021-10-02 DIAGNOSIS — D509 Iron deficiency anemia, unspecified: Secondary | ICD-10-CM | POA: Diagnosis present

## 2021-10-02 DIAGNOSIS — Z9049 Acquired absence of other specified parts of digestive tract: Secondary | ICD-10-CM | POA: Diagnosis not present

## 2021-10-02 DIAGNOSIS — I9589 Other hypotension: Secondary | ICD-10-CM | POA: Diagnosis not present

## 2021-10-02 DIAGNOSIS — N179 Acute kidney failure, unspecified: Secondary | ICD-10-CM | POA: Diagnosis present

## 2021-10-02 DIAGNOSIS — D649 Anemia, unspecified: Secondary | ICD-10-CM | POA: Diagnosis not present

## 2021-10-02 DIAGNOSIS — I10 Essential (primary) hypertension: Secondary | ICD-10-CM | POA: Diagnosis present

## 2021-10-02 DIAGNOSIS — K449 Diaphragmatic hernia without obstruction or gangrene: Secondary | ICD-10-CM | POA: Diagnosis present

## 2021-10-02 DIAGNOSIS — I4821 Permanent atrial fibrillation: Secondary | ICD-10-CM | POA: Diagnosis present

## 2021-10-02 DIAGNOSIS — F1010 Alcohol abuse, uncomplicated: Secondary | ICD-10-CM | POA: Diagnosis present

## 2021-10-02 DIAGNOSIS — E1122 Type 2 diabetes mellitus with diabetic chronic kidney disease: Secondary | ICD-10-CM | POA: Diagnosis present

## 2021-10-02 DIAGNOSIS — E1165 Type 2 diabetes mellitus with hyperglycemia: Secondary | ICD-10-CM | POA: Diagnosis present

## 2021-10-02 DIAGNOSIS — K222 Esophageal obstruction: Secondary | ICD-10-CM | POA: Diagnosis present

## 2021-10-02 DIAGNOSIS — K922 Gastrointestinal hemorrhage, unspecified: Secondary | ICD-10-CM | POA: Diagnosis present

## 2021-10-02 DIAGNOSIS — K264 Chronic or unspecified duodenal ulcer with hemorrhage: Secondary | ICD-10-CM | POA: Diagnosis present

## 2021-10-02 DIAGNOSIS — E1169 Type 2 diabetes mellitus with other specified complication: Secondary | ICD-10-CM | POA: Diagnosis present

## 2021-10-02 DIAGNOSIS — E875 Hyperkalemia: Secondary | ICD-10-CM | POA: Diagnosis present

## 2021-10-02 DIAGNOSIS — K760 Fatty (change of) liver, not elsewhere classified: Secondary | ICD-10-CM | POA: Diagnosis present

## 2021-10-02 DIAGNOSIS — D689 Coagulation defect, unspecified: Secondary | ICD-10-CM | POA: Diagnosis not present

## 2021-10-02 DIAGNOSIS — K219 Gastro-esophageal reflux disease without esophagitis: Secondary | ICD-10-CM | POA: Diagnosis present

## 2021-10-02 DIAGNOSIS — Z85528 Personal history of other malignant neoplasm of kidney: Secondary | ICD-10-CM | POA: Diagnosis not present

## 2021-10-02 DIAGNOSIS — E785 Hyperlipidemia, unspecified: Secondary | ICD-10-CM | POA: Diagnosis present

## 2021-10-02 DIAGNOSIS — Z905 Acquired absence of kidney: Secondary | ICD-10-CM | POA: Diagnosis not present

## 2021-10-02 DIAGNOSIS — Z8673 Personal history of transient ischemic attack (TIA), and cerebral infarction without residual deficits: Secondary | ICD-10-CM | POA: Diagnosis not present

## 2021-10-02 DIAGNOSIS — I959 Hypotension, unspecified: Secondary | ICD-10-CM | POA: Diagnosis present

## 2021-10-02 DIAGNOSIS — K269 Duodenal ulcer, unspecified as acute or chronic, without hemorrhage or perforation: Secondary | ICD-10-CM | POA: Diagnosis not present

## 2021-10-02 DIAGNOSIS — E1140 Type 2 diabetes mellitus with diabetic neuropathy, unspecified: Secondary | ICD-10-CM | POA: Diagnosis present

## 2021-10-02 LAB — BASIC METABOLIC PANEL
Anion gap: 6 (ref 5–15)
BUN: 29 mg/dL — ABNORMAL HIGH (ref 8–23)
CO2: 24 mmol/L (ref 22–32)
Calcium: 9.1 mg/dL (ref 8.9–10.3)
Chloride: 101 mmol/L (ref 98–111)
Creatinine, Ser: 1.81 mg/dL — ABNORMAL HIGH (ref 0.61–1.24)
GFR, Estimated: 40 mL/min — ABNORMAL LOW (ref >60)
Glucose, Bld: 117 mg/dL — ABNORMAL HIGH (ref 70–99)
Potassium: 4.1 mmol/L (ref 3.5–5.1)
Sodium: 131 mmol/L — ABNORMAL LOW (ref 135–145)

## 2021-10-02 LAB — CBC
HCT: 24.5 % — ABNORMAL LOW (ref 39.0–52.0)
HCT: 26.5 % — ABNORMAL LOW (ref 39.0–52.0)
Hemoglobin: 7.9 g/dL — ABNORMAL LOW (ref 13.0–17.0)
Hemoglobin: 8.7 g/dL — ABNORMAL LOW (ref 13.0–17.0)
MCH: 28.2 pg (ref 26.0–34.0)
MCH: 28.7 pg (ref 26.0–34.0)
MCHC: 32.2 g/dL (ref 30.0–36.0)
MCHC: 32.8 g/dL (ref 30.0–36.0)
MCV: 87.5 fL (ref 80.0–100.0)
MCV: 87.5 fL (ref 80.0–100.0)
Platelets: 284 10*3/uL (ref 150–400)
Platelets: 326 10*3/uL (ref 150–400)
RBC: 2.8 MIL/uL — ABNORMAL LOW (ref 4.22–5.81)
RBC: 3.03 MIL/uL — ABNORMAL LOW (ref 4.22–5.81)
RDW: 17 % — ABNORMAL HIGH (ref 11.5–15.5)
RDW: 17.2 % — ABNORMAL HIGH (ref 11.5–15.5)
WBC: 5.9 10*3/uL (ref 4.0–10.5)
WBC: 6.4 10*3/uL (ref 4.0–10.5)
nRBC: 0 % (ref 0.0–0.2)
nRBC: 0 % (ref 0.0–0.2)

## 2021-10-02 LAB — CBG MONITORING, ED
Glucose-Capillary: 105 mg/dL — ABNORMAL HIGH (ref 70–99)
Glucose-Capillary: 95 mg/dL (ref 70–99)
Glucose-Capillary: 212 mg/dL — ABNORMAL HIGH (ref 70–99)

## 2021-10-02 LAB — HEMOGLOBIN AND HEMATOCRIT, BLOOD
HCT: 25.3 % — ABNORMAL LOW (ref 39.0–52.0)
Hemoglobin: 8.5 g/dL — ABNORMAL LOW (ref 13.0–17.0)

## 2021-10-02 LAB — GLUCOSE, CAPILLARY
Glucose-Capillary: 182 mg/dL — ABNORMAL HIGH (ref 70–99)
Glucose-Capillary: 168 mg/dL — ABNORMAL HIGH (ref 70–99)
Glucose-Capillary: 227 mg/dL — ABNORMAL HIGH (ref 70–99)

## 2021-10-02 NOTE — Consult Note (Addendum)
? ?                                                                          [facility] Consult: ?9:17 AM ?10/02/2021 ? LOS: 0 days  ? ? ?Referring Provider: Dr. Karleen Hampshire.   ?Primary Care Physician:  Ginger Organ., MD ?Primary Gastroenterologist:  Dr. Silvano Rusk ? ? ? ?Reason for Consultation: Anemia.  FOBT positive. ?  ?HPI: Angel Shelton is a 66 y.o. male.  Hx DM 2.  Atrial fibrillation.  CVA 04/2021.  Chronic long-term Eliquis.  Diverticulitis with perforation requiring segmental resection 2006, temporary colostomy was reversed within 18 months..  Hypertension.  ? ?10/2005 colonoscopy.  Performed just prior to reversal of colostomy: Diverticulosis.  Stump with minimal diversion colitis.  Good prep.  No polyps or cancer.  Done just prior to reversal of colostomy.   ?05/2016 colonoscopy.  Scattered [place] pandiverticulosis, otherwise normal study.  Unable to identify anastomosis from previous segmental resection. ?04/2018 flexible sigmoidoscopy.  For rectal bleeding.  Non-bleeding external and internal hemorrhoids.  Multiple sigmoid diverticula. ?No previous upper endoscopy. ? ?10 days ago, for about 4 days, he noted black but not tarry or maroon looking stools which resolved to brown, normal looking stools.  For about a week having increased weakness and fatigue with moderate exertion.  No angina.  The weakness and fatigue accelerated to presyncope, tremors, dizziness within 24 hours of arrival to ED yesterday.  Longstanding acid reflux mostly triggered by him eating too fast which may lead to regurgitation.  The symptoms are stable and may not occur every day.  Dysphagia to solids including large pills.  Has never had a food impaction he drinks 1.5 L of bourbon every 4 days.  In addition to daily Eliquis, last dose yesterday morning, he is in a blinded drug study and the medication may be aspirin.    ?Happen to have a scheduled office visit with PCP yesterday.  Labs obtained and Hgb in the low 6 range.  Advised to proceed to the emergency room for evaluation. ? ?In the ED blood pressures have been as low as 92/72 with heart rate as high as 125.  Excellent room air saturations at 100%. ?FOBT positive.   ?Hgb 5.4.Marland Kitchen  2 PRBC... 7.9.  Hgb 12.9 four months ago.  MCV 88.  INR 1.5. ?Sodium 126..  131.  BUN/creatinine 32/2.4.  GFR 32.  LFTs normal. ? ?2 dtrs live in local area.  He lives alone.  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 ?Admits to drinking 1.5 L bourbon every 4 days.  Quit smoking in 2015. ? ? ?Past Medical History:  ?Diagnosis Date  ? Alcohol abuse   ? Diabetes mellitus without complication ([place])   ? Diverticulosis   ? GERD (gastroesophageal reflux disease)   ? Hyperlipidemia   ? Hypertension   ? Left atrial enlargement   ? Neuromuscular disorder ([place])   ? neuropathy  ? Persistent atrial fibrillation ([place])   ? CHA2DS2-VASc score 4 (age-47, HTN-1, DM 2-1, aortic plaque)  ? Stroke [facility])   ? ? ?Past Surgical History:  ?Procedure Laterality Date  ? APPENDECTOMY    ?  CARDIAC CATHETERIZATION N/A 01/05/2015  ? Procedure: Left Heart Cath and Coronary Angiography;  Surgeon: Pixie Casino, MD;  Location: [facility];  Service: Cardiovascular;; (For Abnormal Nuclear Stress Test) mild luminal irregularities of the LCx with mild areas of ectasia.  Calcified ostial D1 mild stenosis. ->  Mild/nonobstructive CAD.  ? CARDIOVERSION N/A 11/01/2014  ? Procedure: CARDIOVERSION;  Surgeon: Pixie Casino, MD;  Location: [facility];  Service: Cardiovascular;  Laterality: N/A;  ? CARDIOVERSION N/A 02/24/2015  ? Procedure: CARDIOVERSION;  Surgeon: Pixie Casino, MD;  Location: [facility];  Service: Cardiovascular;  Laterality: N/A;  ? CATARACT EXTRACTION Bilateral   ? COLON SURGERY N/A 2006  ? 14 inches removed from diverticitis perforation   ? COLONOSCOPY    ? Per stoma  ? EYE SURGERY Bilateral   ? Cat Sx  ? INNER EAR SURGERY    ? as a child  ? NM MYOVIEW LTD  12/08/2014  ? FALSE POSITIVE: Read asINTERMEDIATE RISK.  Partially reversible medium size moderate severity defect in the basal--mid-apical inferoseptal-inferior and apical walls.  Not gated due to A. fib. ->  Referred for cath- NON-OBSTRUCTIVE CAD  ? ROBOTIC ASSITED PARTIAL NEPHRECTOMY Right 05/23/2021  ? Procedure: XI RETROPERITONEAL ROBOTIC ASSITED PARTIAL NEPHRECTOMY;  Surgeon: Alexis Frock, MD;  Location: [facility];  Service: Urology;  Laterality: Right;  ? TEE WITHOUT CARDIOVERSION N/A 11/01/2014  ? Procedure: TRANSESOPHAGEAL ECHOCARDIOGRAM (TEE) -unsuccesful DCCV;  Surgeon: Pixie Casino, MD;  Location: [facility];  Service: Cardiovascular;; Mild concentric LVH.  EF 55 to 60%.  Normal wall motion.  Grade 3 aortic atheroma.  No source of cardiac emboli.  -->  Followed by unsuccessful DCCV.  ? ? ?Prior to Admission medications   ?Medication Sig Start Date End Date Taking? Authorizing Provider  ?aspirin 81 MG chewable tablet Chew 1 tablet (81 mg total) by mouth daily. 06/04/21  Yes Kathie Dike, MD  ?atorvastatin (LIPITOR) 80 MG tablet Take 1 tablet (80 mg total) by mouth daily. 06/03/21  Yes Kathie Dike, MD  ?Cyanocobalamin (VITAMIN B-12 PO) Take 1 tablet by mouth daily.   Yes [provider]  ?diltiazem (CARDIZEM CD) 240 MG 24 hr capsule Take 240 mg by mouth daily. 03/28/21  Yes [provider]  ?ELIQUIS 5 MG TABS tablet Take 5 mg by mouth 2 (two) times daily. 07/25/21  Yes [provider]  ?fenofibrate 160 MG tablet Take 160 mg by mouth daily.   Yes [provider]  ?gabapentin (NEURONTIN) 300 MG capsule Take 300 mg by mouth 3 (three) times daily. Pt takes two 300 mg capsules in the AM, then, pt takes one 300 mg capsule in the PM 04/02/21  Yes [provider]  ?Lactobacillus-Inulin (PROBIOTIC DIGESTIVE SUPPORT PO) Take 1 tablet by mouth  daily.   Yes [provider]  ?lisinopril-hydrochlorothiazide (ZESTORETIC) 20-12.5 MG tablet Take 1 tablet by mouth daily.   Yes [provider]  ?metFORMIN (GLUCOPHAGE) 1000 MG tablet Take 1,000 mg by mouth 2 (two) times daily. 09/09/19  Yes [provider]  ?metoprolol succinate (TOPROL-XL) 100 MG 24 hr tablet Take 100 mg by mouth daily. 09/21/14  Yes [provider]  ?Omega-3 Fatty Acids (FISH OIL) 1000 MG CAPS Take 1,200 mg by mouth daily.   Yes [provider]  ?sildenafil (VIAGRA) 100 MG tablet Take 100 mg by mouth daily as needed. 08/26/21  Yes [provider]  ?Wheat Dextrin (BENEFIBER DRINK MIX) PACK Take 1 Package by mouth daily.  Yes [provider]  ?rosuvastatin (CRESTOR) 40 MG tablet Take 40 mg by mouth daily. ?Patient not taking: Reported on 10/01/2021 10/01/21   [provider]  ? ? ?Scheduled Meds: ? fenofibrate  160 mg Oral Daily  ? insulin aspart  0-9 Units Subcutaneous Q6H  ? pantoprazole (PROTONIX) IV  40 mg Intravenous BID  ? rosuvastatin  40 mg Oral Daily  ? ?Infusions: ? ?PRN Meds: ? ? ? ?Allergies as of 10/01/2021  ? (No Known Allergies)  ? ? ?Family History  ?Problem Relation Age of Onset  ? Heart disease Father   ? Stroke Father   ? Diabetes Mother   ? Diabetes Maternal Grandmother   ? Stroke Maternal Grandmother   ? Heart attack Paternal Grandfather   ? Hodgkin's lymphoma Paternal Aunt   ? Hodgkin's lymphoma Paternal Uncle   ? Colon cancer Neg Hx   ? Esophageal cancer Neg Hx   ? Pancreatic cancer Neg Hx   ? Prostate cancer Neg Hx   ? Rectal cancer Neg Hx   ? Stomach cancer Neg Hx   ? ? ?Social History  ? ?Socioeconomic History  ? Marital status: Single  ?  Spouse name: Not on file  ? Number of children: 2  ? Years of education: Not on file  ? Highest education level: Not on file  ?Occupational History  ? Occupation: Building services engineer  ?Tobacco Use  ? Smoking status: Former  ?  Packs/day: 1.00  ?  Years: 30.00  ?  Pack  years: 30.00  ?  Types: Cigarettes  ?  Quit date: 03/05/2014  ?  Years since quitting: 7.5  ? Smokeless tobacco: Never  ?Vaping Use  ? Vaping Use: Never used  ?Substance and Sexual Activity  ? Alcohol use: Yes

## 2021-10-02 NOTE — ED Notes (Signed)
Transported via stretcher by RN for admission. Patient in possession of all belongings upon leaving the floor.  ?

## 2021-10-02 NOTE — Progress Notes (Signed)
? ? ? [facility] ?                                                                            ? ? ?Angel Shelton, is a 66 y.o. male, DOB - 02-21-1956, GXQ:[id number] ?Admit date - 10/01/2021    ?Outpatient Primary MD for the patient is Ginger Organ., MD ? ?LOS - 0  days ? ? ? ?Brief summary  ? ? ?Angel Shelton is a 66 y.o. 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 Pt reports dizziness for a weak, was found to have abd discomfort, .  ?He recently had a right frontal parenchymal hemorrhagic conversion of ischemic stroke in January and was enrolled in the ASPIRE clinical trial sponsored by [employer] to assess Eliquis vs Aspirin following intracerebral hemorrhage in pt with A.fib. This is a blinded study so he does not know what medication he has been taking. Reports getting 2 pills in the morning and 1 at night. Last dose was 5/8 morning. He was found to be severe anemic with a hemoglobin of 6.1 and AKI of 2.4.  ?Gi consulted and he is scheduled for EGD today.  ?  ? ?Assessment & Plan  ? ? ?Assessment and Plan: ?* Acute GI bleeding / Acute anemia of blood loss ?-Hgb of 6.1 from 12.9 four months ago. FOBT was positive with dark stool on admission ?-Patient is in a blinded clinical trial ASPIRE  aspirin versus Eliquis following intracerebral hemorrhage patient in with A. Fib.  Discussed with Dr Leonie Man with neurology, who has recommended to stop the medication at this time. GI to recommend when to safely go back on the trial after the EGD is done.  ?S/p 2 units of PRbc transfusion and repeat hemoglobin is around 7.9 ?Meanwhile continue with PPI BID and monitor hemoglobin and transfuse to keep it greater than 7.,  ?- GI consulted and he is scheduled for EGD tomorrow.  ?-Colonoscopy in 2018 with  scattered large-mouth diverticular in the entire colon. S/p segmental resection for diverticulitis but anastomosis could not be identified.  ?-Flex sigmoidoscopy in 2019 for rectal hemorrhage with reducible hemorrhoids as source and diverticulosis in sigmoid colon.  ? ? ? ?Iron deficiency anemia:  ?- low iron and ferritin levels.  ?- IV iron.  ? ? ?Hypotension ?Presented with soft BP of 90s/50s.  ?Resolved with PRBC transfusion.  ?Monitor off BP meds.  ? ?Hyperkalemia ?Resolved.  ? ?Hyponatremia ?Improved to 131.  ? ?AKI (acute kidney injury) ([place]) ?Creatinine elevated to 2.04 from baseline around 1  ?Creatinine improved to 1.8. continue to monitor.  ? ?History of CVA (cerebrovascular accident) ?Recent right frontal intracerebral hemorrhage stroke in January ?He was enrolled in ASPIRE clinical trial by neurology. This is an NIH sponsored trial of aspirin vs Eliquis following intracerebral hemorrhage patient with A.fib.  ? ?Atrial fibrillation, permanent ([place]) ?-CHA2DS2-VASc score of 5 ?-Unclear if he really is on anticoagulation or placebo since he is in a blinded clinical trial ASPIRE to evaluation aspirin versus Eliquis following intracerebral hemorrhage patient with A. Fib.  ?  GI to recommend when and if he can go back on the trial after EGD. ? ?Dyslipidemia associated with type 2 diabetes mellitus ([place]) ?Continue statin ? ?DM2 (diabetes mellitus, type 2) ([place]) ?W/hyperglycemia ?-Last HgbA1C in January of 6.2%  ?Place on sensitive SSI  ?Continue to monitor.  ? ? ? ?  ? ?Estimated body mass index is 24.97 kg/m? as calculated from the following: ?  Height as of this encounter: 5' 10"  (1.778 m). ?  Weight as of this encounter: 78.9 kg. ? ?Code Status: full code.  ?DVT Prophylaxis:  SCDs Start: 10/01/21 1857 ? ? ?Level of Care: Level of care: Progressive ?Family Communication: None at bedside.  ? ?Disposition Plan:     Remains inpatient appropriate: EGD  in am, IV protonix.  ? ?Procedures:  ?EGD in am.   ? ?Consultants:   ?Gastroenterology.  ? ?Antimicrobials:  ? ?Anti-infectives (From admission, onward)  ? ? None  ? ?  ? ? ? ?Medications ? ?Scheduled Meds: ? fenofibrate  160 mg Oral Daily  ? insulin aspart  0-9 Units Subcutaneous Q6H  ? pantoprazole (PROTONIX) IV  40 mg Intravenous BID  ? rosuvastatin  40 mg Oral Daily  ? ?Continuous Infusions: ?PRN Meds:. ? ? ? ?Subjective:  ? ?Angel Shelton was seen and examined today.  ?No chest pain or sob. No abd pain. One BM this afternoon, non bloody, brown.  ?Objective:  ? ?Vitals:  ? 10/02/21 0851 10/02/21 0930 10/02/21 1141 10/02/21 1226  ?BP: 128/77 106/78 124/64 127/74  ?Pulse: (!) 117 98 75 77  ?Resp: 15 14 20 15   ?Temp:      ?TempSrc:      ?SpO2: 100% 100% 99% 100%  ?Weight:      ?Height:      ? ? ?Intake/Output Summary (Last 24 hours) at 10/02/2021 1305 ?Last data filed at 10/02/2021 0719 ?Gross per 24 hour  ?Intake --  ?Output 650 ml  ?Net -650 ml  ? ?Filed Weights  ? 10/01/21 1710  ?Weight: 78.9 kg  ? ? ? ?Exam ?General exam: Appears calm and comfortable  ?Respiratory system: Clear to auscultation. Respiratory effort normal. ?Cardiovascular system: S1 & S2 heard, RRR. No JVD,  No pedal edema. ?Gastrointestinal system: Abdomen is nondistended, soft and nontender.  Normal bowel sounds heard. ?Central nervous system: Alert and oriented. No focal neurological deficits. ?Extremities: Symmetric 5 x 5 power. ?Skin: No rashes, lesions or ulcers ?Psychiatry:  Mood & affect appropriate.  ? ? ? ?Data Reviewed:  I have personally reviewed following labs and imaging studies ? ? ?CBC ?Lab Results  ?Component Value Date  ? WBC 5.9 10/02/2021  ? RBC 2.80 (L) 10/02/2021  ? HGB 7.9 (L) 10/02/2021  ? HCT 24.5 (L) 10/02/2021  ? MCV 87.5 10/02/2021  ? MCH 28.2 10/02/2021  ? PLT 284 10/02/2021  ? MCHC 32.2 10/02/2021  ? RDW 17.0 (H) 10/02/2021  ? LYMPHSABS 1.2 10/01/2021  ? MONOABS 1.0 10/01/2021  ? EOSABS 0.2 10/01/2021  ? BASOSABS 0.1 10/01/2021  ? ? ? ?Last metabolic panel ?Lab  Results  ?Component Value Date  ? NA 131 (L) 10/02/2021  ? K 4.1 10/02/2021  ? CL 101 10/02/2021  ? CO2 24 10/02/2021  ? BUN 29 (H) 10/02/2021  ? CREATININE 1.81 (H) 10/02/2021  ? GLUCOSE 117 (H) 10/02/2021  ? GFRNONAA 40 (L) 10/02/2021  ? GFRAA  06/17/2007  ?  >60        ?The eGFR has been calculated ?using the MDRD equation. ?This  calculation has not been ?validated in all clinical  ? CALCIUM 9.1 10/02/2021  ? PROT 6.1 (L) 10/01/2021  ? ALBUMIN 3.5 10/01/2021  ? BILITOT 0.8 10/01/2021  ? ALKPHOS 26 (L) 10/01/2021  ? AST 15 10/01/2021  ? ALT 11 10/01/2021  ? ANIONGAP 6 10/02/2021  ? ? ?CBG (last 3)  ?Recent Labs  ?  10/02/21 ?0156 10/02/21 ?0715 10/02/21 ?1150  ?GLUCAP 105* 95 212*  ?  ? ? ?Coagulation Profile: ?Recent Labs  ?Lab 10/01/21 ?1642  ?INR 1.5*  ? ? ? ?Radiology Studies: ?US Abdomen Limited ? ?Result Date: 10/02/2021 ?CLINICAL DATA:  Alcohol abuse, anemia and melena. EXAM: ULTRASOUND ABDOMEN LIMITED RIGHT UPPER QUADRANT COMPARISON:  Prior renal ultrasound on 06/01/2021 FINDINGS: Gallbladder: Shadowing gallstone in the central gallbladder lumen. The gallbladder is nondistended. Mildly thickened appearance of the gallbladder wall measures 4 mm. No sonographic Murphy sign. Common bile duct: Diameter: 4 mm Liver: The liver demonstrates coarse echotexture and increased echogenicity, likely reflecting diffuse steatosis. No overt cirrhotic contour abnormalities or focal lesions are identified. There is no evidence of intrahepatic biliary ductal dilatation. Portal vein is patent on color Doppler imaging with normal direction of blood flow towards the liver. Other: No ascites is visualized in the right upper quadrant. Incidental cystic lesions of the upper right kidney seen previously and also previously characterized by MRI. IMPRESSION: 1. Cholelithiasis with single central gallstone present. The gallbladder is relatively contracted with mildly thickened appearance of the gallbladder wall. Component of chronic  inflammation/chronic cholecystitis is not completely excluded but the patient was not tender over the region of the gallbladder during the exam. 2. Echogenic liver likely reflecting diffuse steatosis. No overt cirrhotic morpho

## 2021-10-02 NOTE — ED Notes (Signed)
Patient resting quietly in bed, denies any needs at this time.  ?

## 2021-10-02 NOTE — Care Management (Signed)
?  Transition of Care (TOC) Screening Note ? ? ?Patient Details  ?Name: Angel Shelton ?Date of Birth: February 19, 1956 ? ? ?Transition of Care (TOC) CM/SW Contact:    ?Carles Collet, RN ?Phone Number: ?10/02/2021, 3:31 PM ? ? ? ?Transition of Care Department [facility]) has reviewed patient and no TOC needs have been identified at this time. We will continue to monitor patient advancement through interdisciplinary progression rounds. If new patient transition needs arise, please place a TOC consult. ?  ?

## 2021-10-02 NOTE — Plan of Care (Signed)

## 2021-10-02 NOTE — H&P (View-Only) (Signed)
? ?                                                                          [facility] Consult: ?9:17 AM ?10/02/2021 ? LOS: 0 days  ? ? ?Referring Provider: Dr. Karleen Hampshire.   ?Primary Care Physician:  Ginger Organ., MD ?Primary Gastroenterologist:  Dr. Silvano Rusk ? ? ? ?Reason for Consultation: Anemia.  FOBT positive. ?  ?HPI: Angel Shelton is a 66 y.o. male.  Hx DM 2.  Atrial fibrillation.  CVA 04/2021.  Chronic long-term Eliquis.  Diverticulitis with perforation requiring segmental resection 2006, temporary colostomy was reversed within 18 months..  Hypertension.  ? ?10/2005 colonoscopy.  Performed just prior to reversal of colostomy: Diverticulosis.  Stump with minimal diversion colitis.  Good prep.  No polyps or cancer.  Done just prior to reversal of colostomy.   ?05/2016 colonoscopy.  Scattered [place] pandiverticulosis, otherwise normal study.  Unable to identify anastomosis from previous segmental resection. ?04/2018 flexible sigmoidoscopy.  For rectal bleeding.  Non-bleeding external and internal hemorrhoids.  Multiple sigmoid diverticula. ?No previous upper endoscopy. ? ?10 days ago, for about 4 days, he noted black but not tarry or maroon looking stools which resolved to brown, normal looking stools.  For about a week having increased weakness and fatigue with moderate exertion.  No angina.  The weakness and fatigue accelerated to presyncope, tremors, dizziness within 24 hours of arrival to ED yesterday.  Longstanding acid reflux mostly triggered by him eating too fast which may lead to regurgitation.  The symptoms are stable and may not occur every day.  Dysphagia to solids including large pills.  Has never had a food impaction he drinks 1.5 L of bourbon every 4 days.  In addition to daily Eliquis, last dose yesterday morning, he is in a blinded drug study and the medication may be aspirin.    ?Happen to have a scheduled office visit with PCP yesterday.  Labs obtained and Hgb in the low 6 range.  Advised to proceed to the emergency room for evaluation. ? ?In the ED blood pressures have been as low as 92/72 with heart rate as high as 125.  Excellent room air saturations at 100%. ?FOBT positive.   ?Hgb 5.4.Marland Kitchen  2 PRBC... 7.9.  Hgb 12.9 four months ago.  MCV 88.  INR 1.5. ?Sodium 126..  131.  BUN/creatinine 32/2.4.  GFR 32.  LFTs normal. ? ?2 dtrs live in local area.  He lives alone.  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 ?Admits to drinking 1.5 L bourbon every 4 days.  Quit smoking in 2015. ? ? ?Past Medical History:  ?Diagnosis Date  ? Alcohol abuse   ? Diabetes mellitus without complication ([place])   ? Diverticulosis   ? GERD (gastroesophageal reflux disease)   ? Hyperlipidemia   ? Hypertension   ? Left atrial enlargement   ? Neuromuscular disorder ([place])   ? neuropathy  ? Persistent atrial fibrillation ([place])   ? CHA2DS2-VASc score 4 (age-63, HTN-1, DM 2-1, aortic plaque)  ? Stroke [facility])   ? ? ?Past Surgical History:  ?Procedure Laterality Date  ? APPENDECTOMY    ?  CARDIAC CATHETERIZATION N/A 01/05/2015  ? Procedure: Left Heart Cath and Coronary Angiography;  Surgeon: Pixie Casino, MD;  Location: [facility];  Service: Cardiovascular;; (For Abnormal Nuclear Stress Test) mild luminal irregularities of the LCx with mild areas of ectasia.  Calcified ostial D1 mild stenosis. ->  Mild/nonobstructive CAD.  ? CARDIOVERSION N/A 11/01/2014  ? Procedure: CARDIOVERSION;  Surgeon: Pixie Casino, MD;  Location: [facility];  Service: Cardiovascular;  Laterality: N/A;  ? CARDIOVERSION N/A 02/24/2015  ? Procedure: CARDIOVERSION;  Surgeon: Pixie Casino, MD;  Location: [place];  Service: Cardiovascular;  Laterality: N/A;  ? CATARACT EXTRACTION Bilateral   ? COLON SURGERY N/A 2006  ? 14 inches removed from diverticitis perforation   ? COLONOSCOPY    ? Per stoma  ? EYE SURGERY Bilateral   ? Cat Sx  ? INNER EAR SURGERY    ? as a child  ? NM MYOVIEW LTD  12/08/2014  ? FALSE POSITIVE: Read asINTERMEDIATE RISK.  Partially reversible medium size moderate severity defect in the basal--mid-apical inferoseptal-inferior and apical walls.  Not gated due to A. fib. ->  Referred for cath- NON-OBSTRUCTIVE CAD  ? ROBOTIC ASSITED PARTIAL NEPHRECTOMY Right 05/23/2021  ? Procedure: XI RETROPERITONEAL ROBOTIC ASSITED PARTIAL NEPHRECTOMY;  Surgeon: Alexis Frock, MD;  Location: [facility];  Service: Urology;  Laterality: Right;  ? TEE WITHOUT CARDIOVERSION N/A 11/01/2014  ? Procedure: TRANSESOPHAGEAL ECHOCARDIOGRAM (TEE) -unsuccesful DCCV;  Surgeon: Pixie Casino, MD;  Location: [facility];  Service: Cardiovascular;; Mild concentric LVH.  EF 55 to 60%.  Normal wall motion.  Grade 3 aortic atheroma.  No source of cardiac emboli.  -->  Followed by unsuccessful DCCV.  ? ? ?Prior to Admission medications   ?Medication Sig Start Date End Date Taking? Authorizing Provider  ?aspirin 81 MG chewable tablet Chew 1 tablet (81 mg total) by mouth daily. 06/04/21  Yes Kathie Dike, MD  ?atorvastatin (LIPITOR) 80 MG tablet Take 1 tablet (80 mg total) by mouth daily. 06/03/21  Yes Kathie Dike, MD  ?Cyanocobalamin (VITAMIN B-12 PO) Take 1 tablet by mouth daily.   Yes [provider]  ?diltiazem (CARDIZEM CD) 240 MG 24 hr capsule Take 240 mg by mouth daily. 03/28/21  Yes [provider]  ?ELIQUIS 5 MG TABS tablet Take 5 mg by mouth 2 (two) times daily. 07/25/21  Yes [provider]  ?fenofibrate 160 MG tablet Take 160 mg by mouth daily.   Yes [provider]  ?gabapentin (NEURONTIN) 300 MG capsule Take 300 mg by mouth 3 (three) times daily. Pt takes two 300 mg capsules in the AM, then, pt takes one 300 mg capsule in the PM 04/02/21  Yes [provider]  ?Lactobacillus-Inulin (PROBIOTIC DIGESTIVE SUPPORT PO) Take 1 tablet by mouth  daily.   Yes [provider]  ?lisinopril-hydrochlorothiazide (ZESTORETIC) 20-12.5 MG tablet Take 1 tablet by mouth daily.   Yes [provider]  ?metFORMIN (GLUCOPHAGE) 1000 MG tablet Take 1,000 mg by mouth 2 (two) times daily. 09/09/19  Yes [provider]  ?metoprolol succinate (TOPROL-XL) 100 MG 24 hr tablet Take 100 mg by mouth daily. 09/21/14  Yes [provider]  ?Omega-3 Fatty Acids (FISH OIL) 1000 MG CAPS Take 1,200 mg by mouth daily.   Yes [provider]  ?sildenafil (VIAGRA) 100 MG tablet Take 100 mg by mouth daily as needed. 08/26/21  Yes [provider]  ?Wheat Dextrin (BENEFIBER DRINK MIX) PACK Take 1 Package by mouth daily.  Yes [provider]  ?rosuvastatin (CRESTOR) 40 MG tablet Take 40 mg by mouth daily. ?Patient not taking: Reported on 10/01/2021 10/01/21   [provider]  ? ? ?Scheduled Meds: ? fenofibrate  160 mg Oral Daily  ? insulin aspart  0-9 Units Subcutaneous Q6H  ? pantoprazole (PROTONIX) IV  40 mg Intravenous BID  ? rosuvastatin  40 mg Oral Daily  ? ?Infusions: ? ?PRN Meds: ? ? ? ?Allergies as of 10/01/2021  ? (No Known Allergies)  ? ? ?Family History  ?Problem Relation Age of Onset  ? Heart disease Father   ? Stroke Father   ? Diabetes Mother   ? Diabetes Maternal Grandmother   ? Stroke Maternal Grandmother   ? Heart attack Paternal Grandfather   ? Hodgkin's lymphoma Paternal Aunt   ? Hodgkin's lymphoma Paternal Uncle   ? Colon cancer Neg Hx   ? Esophageal cancer Neg Hx   ? Pancreatic cancer Neg Hx   ? Prostate cancer Neg Hx   ? Rectal cancer Neg Hx   ? Stomach cancer Neg Hx   ? ? ?Social History  ? ?Socioeconomic History  ? Marital status: Single  ?  Spouse name: Not on file  ? Number of children: 2  ? Years of education: Not on file  ? Highest education level: Not on file  ?Occupational History  ? Occupation: Building services engineer  ?Tobacco Use  ? Smoking status: Former  ?  Packs/day: 1.00  ?  Years: 30.00  ?  Pack  years: 30.00  ?  Types: Cigarettes  ?  Quit date: 03/05/2014  ?  Years since quitting: 7.5  ? Smokeless tobacco: Never  ?Vaping Use  ? Vaping Use: Never used  ?Substance and Sexual Activity  ? Alcohol use: Yes

## 2021-10-03 ENCOUNTER — Inpatient Hospital Stay (HOSPITAL_COMMUNITY): Payer: Medicare Other | Admitting: Anesthesiology

## 2021-10-03 ENCOUNTER — Encounter (HOSPITAL_COMMUNITY)
Admission: EM | Disposition: A | Discharge status: Home / Self Care | Payer: Self-pay | Source: Home / Self Care | Attending: Family Medicine

## 2021-10-03 ENCOUNTER — Encounter (HOSPITAL_COMMUNITY): Payer: Self-pay | Admitting: Family Medicine

## 2021-10-03 ENCOUNTER — Other Ambulatory Visit (HOSPITAL_COMMUNITY): Payer: Self-pay

## 2021-10-03 DIAGNOSIS — K269 Duodenal ulcer, unspecified as acute or chronic, without hemorrhage or perforation: Secondary | ICD-10-CM

## 2021-10-03 DIAGNOSIS — K264 Chronic or unspecified duodenal ulcer with hemorrhage: Secondary | ICD-10-CM

## 2021-10-03 DIAGNOSIS — K449 Diaphragmatic hernia without obstruction or gangrene: Secondary | ICD-10-CM

## 2021-10-03 DIAGNOSIS — D62 Acute posthemorrhagic anemia: Secondary | ICD-10-CM

## 2021-10-03 DIAGNOSIS — K222 Esophageal obstruction: Secondary | ICD-10-CM

## 2021-10-03 HISTORY — PX: BIOPSY: SHX5522

## 2021-10-03 HISTORY — PX: ESOPHAGOGASTRODUODENOSCOPY (EGD) WITH PROPOFOL: SHX5813

## 2021-10-03 HISTORY — PX: BALLOON DILATION: SHX5330

## 2021-10-03 LAB — BASIC METABOLIC PANEL
Anion gap: 8 (ref 5–15)
BUN: 15 mg/dL (ref 8–23)
CO2: 25 mmol/L (ref 22–32)
Calcium: 9.6 mg/dL (ref 8.9–10.3)
Chloride: 100 mmol/L (ref 98–111)
Creatinine, Ser: 1.23 mg/dL (ref 0.61–1.24)
GFR, Estimated: >60 mL/min (ref >60)
Glucose, Bld: 112 mg/dL — ABNORMAL HIGH (ref 70–99)
Potassium: 3.7 mmol/L (ref 3.5–5.1)
Sodium: 133 mmol/L — ABNORMAL LOW (ref 135–145)

## 2021-10-03 LAB — CBC WITH DIFFERENTIAL/PLATELET
Abs Immature Granulocytes: 0.04 10*3/uL (ref 0.00–0.07)
Basophils Absolute: 0.1 10*3/uL (ref 0.0–0.1)
Basophils Relative: 1 %
Eosinophils Absolute: 0.3 10*3/uL (ref 0.0–0.5)
Eosinophils Relative: 4 %
HCT: 28.1 % — ABNORMAL LOW (ref 39.0–52.0)
Hemoglobin: 9.2 g/dL — ABNORMAL LOW (ref 13.0–17.0)
Immature Granulocytes: 1 %
Lymphocytes Relative: 26 %
Lymphs Abs: 1.6 10*3/uL (ref 0.7–4.0)
MCH: 28 pg (ref 26.0–34.0)
MCHC: 32.7 g/dL (ref 30.0–36.0)
MCV: 85.7 fL (ref 80.0–100.0)
Monocytes Absolute: 0.9 10*3/uL (ref 0.1–1.0)
Monocytes Relative: 15 %
Neutro Abs: 3.4 10*3/uL (ref 1.7–7.7)
Neutrophils Relative %: 53 %
Platelets: 332 10*3/uL (ref 150–400)
RBC: 3.28 MIL/uL — ABNORMAL LOW (ref 4.22–5.81)
RDW: 16.7 % — ABNORMAL HIGH (ref 11.5–15.5)
WBC: 6.3 10*3/uL (ref 4.0–10.5)
nRBC: 0 % (ref 0.0–0.2)

## 2021-10-03 LAB — PROTIME-INR
INR: 1.2 (ref 0.8–1.2)
Prothrombin Time: 15.5 seconds — ABNORMAL HIGH (ref 11.4–15.2)

## 2021-10-03 LAB — GLUCOSE, CAPILLARY
Glucose-Capillary: 112 mg/dL — ABNORMAL HIGH (ref 70–99)
Glucose-Capillary: 103 mg/dL — ABNORMAL HIGH (ref 70–99)
Glucose-Capillary: 94 mg/dL (ref 70–99)
Glucose-Capillary: 137 mg/dL — ABNORMAL HIGH (ref 70–99)
Glucose-Capillary: 87 mg/dL (ref 70–99)
Glucose-Capillary: 258 mg/dL — ABNORMAL HIGH (ref 70–99)
Glucose-Capillary: 217 mg/dL — ABNORMAL HIGH (ref 70–99)

## 2021-10-03 LAB — HEMOGLOBIN AND HEMATOCRIT, BLOOD
HCT: 26.4 % — ABNORMAL LOW (ref 39.0–52.0)
Hemoglobin: 8.6 g/dL — ABNORMAL LOW (ref 13.0–17.0)

## 2021-10-03 SURGERY — ESOPHAGOGASTRODUODENOSCOPY (EGD) WITH PROPOFOL
Anesthesia: Monitor Anesthesia Care

## 2021-10-03 MED ORDER — PHENYLEPHRINE 80 MCG/ML (10ML) SYRINGE FOR IV PUSH (FOR BLOOD PRESSURE SUPPORT)
PREFILLED_SYRINGE | INTRAVENOUS | Status: DC | PRN
  Administered 2021-10-03: 80 ug via INTRAVENOUS

## 2021-10-03 MED ORDER — PANTOPRAZOLE SODIUM 40 MG PO TBEC
40.0000 mg | DELAYED_RELEASE_TABLET | Freq: Two times a day (BID) | ORAL | Status: DC
  Administered 2021-10-04: 40 mg via ORAL
  Filled 2021-10-03: qty 1

## 2021-10-03 MED ORDER — ONDANSETRON HCL 4 MG/2ML IJ SOLN: 4.0000 mg | Freq: Four times a day (QID) | INTRAMUSCULAR | Status: DC | PRN

## 2021-10-03 MED ORDER — PROPOFOL 500 MG/50ML IV EMUL
INTRAVENOUS | Status: DC | PRN
  Administered 2021-10-03: 150 ug/kg/min via INTRAVENOUS

## 2021-10-03 MED ORDER — SODIUM CHLORIDE 0.9 % IV SOLN: INTRAVENOUS | Status: DC

## 2021-10-03 MED ORDER — PANTOPRAZOLE SODIUM 40 MG PO TBEC
40.0000 mg | DELAYED_RELEASE_TABLET | Freq: Two times a day (BID) | ORAL | 0 refills | Status: DC
  Filled 2021-10-03: qty 112, 56d supply, fill #0

## 2021-10-03 MED ORDER — INSULIN ASPART 100 UNIT/ML IJ SOLN
0.0000 [IU] | Freq: Three times a day (TID) | INTRAMUSCULAR | Status: DC
  Administered 2021-10-04: 3 [IU] via SUBCUTANEOUS

## 2021-10-03 MED ORDER — LACTATED RINGERS IV SOLN: INTRAVENOUS | Status: DC | PRN

## 2021-10-03 MED ORDER — METOPROLOL SUCCINATE ER 100 MG PO TB24
100.0000 mg | ORAL_TABLET | Freq: Every day | ORAL | Status: DC
  Administered 2021-10-03 – 2021-10-04 (×2): 100 mg via ORAL
  Filled 2021-10-03 (×2): qty 1

## 2021-10-03 MED ORDER — PROPOFOL 10 MG/ML IV BOLUS
INTRAVENOUS | Status: DC | PRN
  Administered 2021-10-03 (×2): 30 mg via INTRAVENOUS

## 2021-10-03 SURGICAL SUPPLY — 14 items
BLOCK BITE 60FR ADLT L/F BLUE (MISCELLANEOUS) ×3
ELECT REM PT RETURN 9FT ADLT (ELECTROSURGICAL)
FORCEP RJ3 GP 1.8X160 W-NEEDLE (CUTTING FORCEPS)
FORCEPS BIOP RAD 4 LRG CAP 4 (CUTTING FORCEPS)
NDL SCLEROTHERAPY 25GX240 (NEEDLE) IMPLANT
NEEDLE SCLEROTHERAPY 25GX240 (NEEDLE)
PROBE APC STR FIRE (PROBE)
PROBE INJECTION GOLD (MISCELLANEOUS)
PROBE INJECTION GOLD 7FR (MISCELLANEOUS)
SNARE SHORT THROW 13M SML OVAL (MISCELLANEOUS)
SYR 50ML LL SCALE MARK (SYRINGE)
TUBING ENDO SMARTCAP PENTAX (MISCELLANEOUS) ×6
TUBING IRRIGATION ENDOGATOR (MISCELLANEOUS) ×3
WATER STERILE IRR 1000ML POUR (IV SOLUTION)

## 2021-10-03 NOTE — Plan of Care (Signed)

## 2021-10-03 NOTE — Op Note (Signed)
[facility] ?Patient Name: Angel Shelton ?Procedure Date : 10/03/2021 ?MRN: [id number] ?Attending MD: Estill Cotta. Loletha Carrow , MD ?Date of Birth: 12-11-1955 ?CSN: [id number] ?Age: 66 ?Admit Type: Inpatient ?Procedure:                Upper GI endoscopy ?Indications:              Acute post hemorrhagic anemia, Melena ?                          Patient currently in a blinded study after ischemic  ?                          CVA with hemorrhagic conversion in Dec 2022; he has  ?                          been receiving either aspirin or Eliquis. ?Providers:                Estill Cotta. Loletha Carrow, MD, Ervin Knack, RN, Primitivo Gauze  ?                          [place], Technician ?Referring MD:             [facility] ?Medicines:                Monitored Anesthesia Care ?Complications:            No immediate complications. ?Estimated Blood Loss:     Estimated blood loss was minimal. ?Procedure:                Pre-Anesthesia Assessment: ?                          - Prior to the procedure, a History and Physical  ?                          was performed, and patient medications and  ?                          allergies were reviewed. The patient's tolerance of  ?                          previous anesthesia was also reviewed. The risks  ?                          and benefits of the procedure and the sedation  ?                          options and risks were discussed with the patient.  ?                          All questions were answered, and informed consent  ?                          was obtained. Prior Anticoagulants: The patient has  ?  taken either Eliquis (apixaban), or aspirin last  ?                          dose was 2 days prior to procedure (see above). ASA  ?                          Grade Assessment: III - A patient with severe  ?                          systemic disease. After reviewing the risks and  ?                          benefits, the patient was deemed in satisfactory  ?                           condition to undergo the procedure. ?                          After obtaining informed consent, the endoscope was  ?                          passed under direct vision. Throughout the  ?                          procedure, the patient's blood pressure, pulse, and  ?                          oxygen saturations were monitored continuously. The  ?                          [id number]-[id number] ([id number]) Olympus endoscope was introduced  ?                          through the mouth, and advanced to the second part  ?                          of duodenum. The upper GI endoscopy was  ?                          accomplished without difficulty. The patient  ?                          tolerated the procedure well. ?Scope In: ?Scope Out: ?Findings: ?     A 1-2 cm hiatal hernia was present. ?     One benign-appearing, intrinsic moderate stenosis was found at the  ?     gastroesophageal junction. This stenosis measured 9-10 mm (inner  ?     diameter) x less than one cm (in length). The stenosis was traversed  ?     with slow, steady scope pressure. A TTS dilator was passed through the  ?     scope. Dilation with a 12-13.5-15 mm balloon dilator was performed to 15  ?     mm. The dilation site was examined and showed moderate mucosal  ?  disruption and mild improvement in luminal narrowing. Estimated blood  ?     loss was minimal. ?     Normal mucosa was found in the entire examined stomach. Biopsies were  ?     taken with a cold forceps for histology (antrum and body) because DU  ?     discovered. ?     Two non-bleeding cratered duodenal ulcers were found in close proximity  ?     in the distal duodenal bulb/proximal sweep. The largest lesion was 6-8  ?     mm in largest dimension. Difficult to completely visualize due to  ?     location and edema, but there may have been a small flat red spot on one  ?     of the ulcers. Adjacent tissue friable, scope able to pass with mild  ?     resistance due to edema. ?      The exam of the duodenum was otherwise normal. ?Impression:               - 1-2 cm hiatal hernia. ?                          - Benign-appearing esophageal stenosis. Dilated. ?                          - Normal mucosa was found in the entire stomach.  ?                          Biopsied. ?                          - Non-bleeding duodenal ulcers. ?Recommendation:           - Return patient to hospital ward for ongoing care. ?                          - Resume regular diet. ?                          - Pantoprazole 40 mg PO twice daily x 8 weeks. ?                          Follow up biopsy results. ?                          Chart message conversation with GI, [facility] and  ?                          patient's Neurologist has begun regarding this  ?                          patient's antiplatelet or anticoagulant medicine.  ?                          Study protocol will need to be broken to know which  ?                          med he has been taking in order to  make further  ?                          recommendations regarding ongoing medical  ?                          management. ?Procedure Code(s):        --- Professional --- ?                          [phone], Esophagogastroduodenoscopy, flexible,  ?                          transoral; with transendoscopic balloon dilation of  ?                          esophagus (less than 30 mm diameter) ?                          43239, 59, Esophagogastroduodenoscopy, flexible,  ?                          transoral; with biopsy, single or multiple ?Diagnosis Code(s):        --- Professional --- ?                          K22.2, Esophageal obstruction ?                          K26.9, Duodenal ulcer, unspecified as acute or  ?                          chronic, without hemorrhage or perforation ?                          D62, Acute posthemorrhagic anemia ?                          K92.1, Melena (includes Hematochezia) ?CPT copyright 2019 [employer]. All rights  reserved. ?The codes documented in this report are preliminary and upon coder review may  ?be revised to meet current compliance requirements. ?Darla Mcdonald L. Loletha Carrow, MD ?10/03/2021 11:05:54 AM ?This report has been signed electronically. ?Number of Addenda: 0 ?

## 2021-10-03 NOTE — Progress Notes (Signed)
ASPIRE STUDY RESEARCH NOTE ? ?Patient presented with upper GI bleed and was found to have a bleeding duodenal ulcer on endoscopy. ASPIRE study medication ( eliquis or aspirin ) has been held.  There is concern from gastroenterologist about identifying which medication he was on which will require breaking the blind.  I spoke to Dr. Donia Pounds national PI for this study who stated that there are strict criteria for breaking the blind mostly patient having life-threatening hemorrhage which may require anticoagulation reversal and since this patient is hemodynamically stable at the present time he does not meet the criteria.  Recommendation from the [facility] study team is to medically manage the duodenal ulcer and repeat endoscopy in 2 to 3 weeks and if the ulcer has healed resume the study medication if patient is willing.  I discussed this with the patient and he understands the risk involved in having a recurrent stroke while study medication is on hold and is willing to proceed with our plan.  I spoke to Dr. Cordelia Poche and Dr. Wilfrid Lund gastroenterologist who agreed with the plan.  We will hold the study medication for 2 to 3 weeks and repeat endoscopy and make subsequent decision whether to resume the study medication or not depending upon how the ulcer is healing. ? ? ?Antony Contras, MD ?

## 2021-10-03 NOTE — Transfer of Care (Signed)
Immediate Anesthesia Transfer of Care Note ? ?Patient: Angel Shelton ? ?Procedure(s) Performed: ESOPHAGOGASTRODUODENOSCOPY (EGD) WITH PROPOFOL ?BIOPSY ?BALLOON DILATION ? ?Patient Location: PACU ? ?Anesthesia Type:MAC ? ?Level of Consciousness: drowsy and patient cooperative ? ?Airway & Oxygen Therapy: Patient Spontanous Breathing and Patient connected to nasal cannula oxygen ? ?Post-op Assessment: Report given to RN and Post -op Vital signs reviewed and stable ? ?Post vital signs: Reviewed and stable ? ?Last Vitals:  ?Vitals Value Taken Time  ?BP 134/87 10/03/21 1102  ?Temp 36.8 ?C 10/03/21 1100  ?Pulse 109 10/03/21 1104  ?Resp 22 10/03/21 1104  ?SpO2 96 % 10/03/21 1104  ?Vitals shown include unvalidated device data. ? ?Last Pain:  ?Vitals:  ? 10/03/21 1100  ?TempSrc:   ?PainSc: 0-No pain  ?   ? ?  ? ?Complications: No notable events documented. ?

## 2021-10-03 NOTE — Anesthesia Preprocedure Evaluation (Signed)
Anesthesia Evaluation  ?Patient identified by MRN, date of birth, ID band ?Patient awake ? ? ? ?Reviewed: ?Allergy & Precautions, H&P , NPO status , Patient's Chart, lab work & pertinent test results ? ?Airway ?Mallampati: II ? ? ?Neck ROM: full ? ? ? Dental ?  ?Pulmonary ?former smoker,  ?  ?breath sounds clear to auscultation ? ? ? ? ? ? Cardiovascular ?hypertension, + dysrhythmias Atrial Fibrillation  ?Rhythm:regular Rate:Normal ? ? ?  ?Neuro/Psych ? Neuromuscular disease CVA   ? GI/Hepatic ?GERD  ,  ?Endo/Other  ?diabetes, Type 2 ? Renal/GU ?  ? ?  ?Musculoskeletal ? ? Abdominal ?  ?Peds ? Hematology ? ?(+) Blood dyscrasia, anemia ,   ?Anesthesia Other Findings ? ? Reproductive/Obstetrics ? ?  ? ? ? ? ? ? ? ? ? ? ? ? ? ?  ?  ? ? ? ? ? ? ? ? ?Anesthesia Physical ?Anesthesia Plan ? ?ASA: 3 ? ?Anesthesia Plan: MAC  ? ?Post-op Pain Management:   ? ?Induction: Intravenous ? ?PONV Risk Score and Plan: 1 and Propofol infusion and Treatment may vary due to age or medical condition ? ?Airway Management Planned: Simple Face Mask ? ?Additional Equipment:  ? ?Intra-op Plan:  ? ?Post-operative Plan:  ? ?Informed Consent: I have reviewed the patients History and Physical, chart, labs and discussed the procedure including the risks, benefits and alternatives for the proposed anesthesia with the patient or authorized representative who has indicated his/her understanding and acceptance.  ? ? ? ?Dental advisory given ? ?Plan Discussed with: CRNA, Anesthesiologist and Surgeon ? ?Anesthesia Plan Comments:   ? ? ? ? ? ? ?Anesthesia Quick Evaluation ? ?

## 2021-10-03 NOTE — Interval H&P Note (Signed)
History and Physical Interval Note: ? ?10/03/2021 ?9:30 AM ? ?Angel Shelton  has presented today for surgery, with the diagnosis of melena and anemia.  The various methods of treatment have been discussed with the patient and family. After consideration of risks, benefits and other options for treatment, the patient has consented to  Procedure(s): ?ESOPHAGOGASTRODUODENOSCOPY (EGD) WITH PROPOFOL (N/A) as a surgical intervention.  The patient's history has been reviewed, patient examined, no change in status, stable for surgery.  I have reviewed the patient's chart and labs.  Questions were answered to the patient's satisfaction.   ? ?Seen and evaluated in preprocedure area.  He denies chest pain or dyspnea, hemoglobin 9.2 today. ? ?Nelida Meuse III ? ? ?

## 2021-10-03 NOTE — Progress Notes (Signed)
? ?PROGRESS NOTE ? ? ? ?Angel Shelton  MRN:[id number] DOB: 02/24/1956 DOA: 10/01/2021 ?PCP: Shaw, William D Jr., MD ? ? ?Brief Narrative: ?Angel Shelton is a 66 y.o. male with a history of persistent atrial fibrillation, hypertension, diabetes, right kidney neoplasm s/p partial nephrectomy, recent intracerebral hemorrhagic stroke. Patient presented with dizziness for one week and was found to have evidence of symptomatic anemia. Concern for GI bleeding. pRBC transfused. GI consulted and upper endoscopy performed, revealing non-bleeding duodenal ulcers. ? ? ?Assessment and Plan: ?Acute GI bleeding ?Baseline hemoglobin of about 12-13. Hemoglobin on admission of 6.1 down to 5.4 in setting of dark stool concerning for melena. FOBT positive. Complicated by patient enrolled in clinical trial for recent stroke; unsure if on aspirin vs Eliquis. Patient transfused a total of 2 units of pRBC with post-transfusion hemoglobin of 7.9 up to as high as 9.2. GI performed upper endoscopy on 5/10 which was significant for a non-bleeding duodenal ulcers. Recommendation for repeat endoscopy in 2-3 weeks to ensure healing ulcer, Protonix 40 mg BID x8 weeks. Biopsy obtained for H. Pylori testing. ?-GI recommendations: observe until tomorrow ? ?Hypotension ?Presented with soft BP of 90s/50s. Secondary to acute GI bleeding and resultant symptomatic anemia. Antihypertensives held on admission and patient transfused. Now resolved. ? ?Primary hypertension ?Patient is on Cardizem CD, lisinopril-hydrochlorothiazide and Toprol XL as an outpatient, which were held secondary to hypotension on admission in setting of acute GI bleeding. Outpatient antihypertensives held. Blood pressure now rebounding. ?-Resume home Toprol XL ? ?Hyperkalemia ?Follow repeat after transfusion ?Also has received 3 units of insulin with his hyperglycemia ? ?Hyponatremia ?Mild after correcting for hyperglycemia. Due to hypovolemia. Will follow after transfusion.   ? ?AKI (acute kidney injury) (HCC) ?Baseline creatinine of about 1. Creatinine of 2.20 on admission with peak of 2.4 in setting of hypotension/hypovolemia. Improved with fluid resuscitation/blood transfusion. ? ?History of CVA (cerebrovascular accident) ?Recent right frontal intracerebral hemorrhage stroke in January. Patient was enrolled in a clinical trial (ASPIRE) and is either on aspirin or Eliquis prior to admission. Neurology consulted for recommendations secondary to current GI bleed from duodenal ulcer. Recommendation to hold anticoagulation until ulcer has stabilized or if H. Pylori testing is positive. ? ?Atrial fibrillation, permanent (HCC) ?CHA2DS2-VASc score of 5. Patient is in a clinical trial as mentioned above, relating to his anticoagulation. Toprol XL was held on admission ?-Resume Toprol XL ? ?Dyslipidemia associated with type 2 diabetes mellitus (HCC) ?Continue statin ? ?DM2 (diabetes mellitus, type 2) (HCC) ?Controlled. Last hemoglobin A1C of 6.2%. Patient is on metformin as an outpatient, which were held on admission. SSI started while inpatient. ?-Continue SSI ? ?Esophageal stenosis ?Noticed incidentally on upper endoscopy. Dilated. ? ?Hypokalemia-resolved as of 10/01/2021 ?Follow repeat after transfusion. ? ? ?DVT prophylaxis: SCDs ?Code Status:   Code Status: Full Code ?Family Communication: None at bedside ?Disposition Plan: Discharge home in AM per GI recommendations ? ? ?Consultants:  ?[facility] ? ?Procedures:  ?Upper endoscopy (5/10) ? ?Antimicrobials: ?None  ? ? ?Subjective: ?No concerns this morning. Reports two normal appearing bowel movements over the last couple of days. No melena or hematochezia noted by patient. ? ?Objective: ?BP (!) 154/84 (BP Location: Left Arm)   Pulse 74   Temp 97.6 ?F (36.4 ?C) (Oral)   Resp 20   Ht 5' 10" (1.778 m)   Wt 78.9 kg   SpO2 100%   BMI 24.96 kg/m?  ? ?Examination: ? ?General exam: Appears calm and comfortable ?Respiratory  system: Clear to   auscultation. Respiratory effort normal. ?Cardiovascular system: S1 & S2 heard ?Gastrointestinal system: Abdomen is nondistended, soft and nontender. No organomegaly or masses felt. Normal bowel sounds heard. ?Central nervous system: Alert and oriented. No focal neurological deficits. ?Musculoskeletal: No edema. No calf tenderness ?Skin: No cyanosis. No rashes ?Psychiatry: Judgement and insight appear normal. Mood & affect appropriate.  ? ? ?Data Reviewed: I have personally reviewed following labs and imaging studies ? ?CBC ?Lab Results  ?Component Value Date  ? WBC 6.3 10/03/2021  ? RBC 3.28 (L) 10/03/2021  ? HGB 9.2 (L) 10/03/2021  ? HCT 28.1 (L) 10/03/2021  ? MCV 85.7 10/03/2021  ? MCH 28.0 10/03/2021  ? PLT 332 10/03/2021  ? MCHC 32.7 10/03/2021  ? RDW 16.7 (H) 10/03/2021  ? LYMPHSABS 1.6 10/03/2021  ? MONOABS 0.9 10/03/2021  ? EOSABS 0.3 10/03/2021  ? BASOSABS 0.1 10/03/2021  ? ? ? ?Last metabolic panel ?Lab Results  ?Component Value Date  ? NA 133 (L) 10/03/2021  ? K 3.7 10/03/2021  ? CL 100 10/03/2021  ? CO2 25 10/03/2021  ? BUN 15 10/03/2021  ? CREATININE 1.23 10/03/2021  ? GLUCOSE 112 (H) 10/03/2021  ? GFRNONAA >60 10/03/2021  ? GFRAA  06/17/2007  ?  >60        ?The eGFR has been calculated ?using the MDRD equation. ?This calculation has not been ?validated in all clinical  ? CALCIUM 9.6 10/03/2021  ? PROT 6.1 (L) 10/01/2021  ? ALBUMIN 3.5 10/01/2021  ? BILITOT 0.8 10/01/2021  ? ALKPHOS 26 (L) 10/01/2021  ? AST 15 10/01/2021  ? ALT 11 10/01/2021  ? ANIONGAP 8 10/03/2021  ? ? ?GFR: ?Estimated Creatinine Clearance: 60.2 mL/min (by C-G formula based on SCr of 1.23 mg/dL). ? ?No results found for this or any previous visit (from the past 240 hour(s)).  ? ? ?Radiology Studies: ?US Abdomen Limited ? ?Result Date: 10/02/2021 ?CLINICAL DATA:  Alcohol abuse, anemia and melena. EXAM: ULTRASOUND ABDOMEN LIMITED RIGHT UPPER QUADRANT COMPARISON:  Prior renal ultrasound on 06/01/2021 FINDINGS: Gallbladder:  Shadowing gallstone in the central gallbladder lumen. The gallbladder is nondistended. Mildly thickened appearance of the gallbladder wall measures 4 mm. No sonographic Murphy sign. Common bile duct: Diameter: 4 mm Liver: The liver demonstrates coarse echotexture and increased echogenicity, likely reflecting diffuse steatosis. No overt cirrhotic contour abnormalities or focal lesions are identified. There is no evidence of intrahepatic biliary ductal dilatation. Portal vein is patent on color Doppler imaging with normal direction of blood flow towards the liver. Other: No ascites is visualized in the right upper quadrant. Incidental cystic lesions of the upper right kidney seen previously and also previously characterized by MRI. IMPRESSION: 1. Cholelithiasis with single central gallstone present. The gallbladder is relatively contracted with mildly thickened appearance of the gallbladder wall. Component of chronic inflammation/chronic cholecystitis is not completely excluded but the patient was not tender over the region of the gallbladder during the exam. 2. Echogenic liver likely reflecting diffuse steatosis. No overt cirrhotic morphology is identified by ultrasound. Electronically Signed   By: Aletta Edouard M.D.   On: 10/02/2021 11:19   ? ? ? LOS: 1 day  ? ? ?Cordelia Poche, MD ?[facility] ?10/03/2021, 3:47 PM ? ? ?If 7PM-7AM, please contact night-coverage ?[email or web address] ? ?

## 2021-10-04 ENCOUNTER — Telehealth: Payer: Self-pay

## 2021-10-04 ENCOUNTER — Encounter (HOSPITAL_COMMUNITY): Payer: Self-pay | Admitting: Gastroenterology

## 2021-10-04 DIAGNOSIS — Z7901 Long term (current) use of anticoagulants: Secondary | ICD-10-CM

## 2021-10-04 DIAGNOSIS — K922 Gastrointestinal hemorrhage, unspecified: Secondary | ICD-10-CM

## 2021-10-04 DIAGNOSIS — K222 Esophageal obstruction: Secondary | ICD-10-CM

## 2021-10-04 LAB — BASIC METABOLIC PANEL
Anion gap: 8 (ref 5–15)
BUN: 15 mg/dL (ref 8–23)
CO2: 24 mmol/L (ref 22–32)
Calcium: 9.4 mg/dL (ref 8.9–10.3)
Chloride: 98 mmol/L (ref 98–111)
Creatinine, Ser: 1.39 mg/dL — ABNORMAL HIGH (ref 0.61–1.24)
GFR, Estimated: 56 mL/min — ABNORMAL LOW (ref >60)
Glucose, Bld: 127 mg/dL — ABNORMAL HIGH (ref 70–99)
Potassium: 4.1 mmol/L (ref 3.5–5.1)
Sodium: 130 mmol/L — ABNORMAL LOW (ref 135–145)

## 2021-10-04 LAB — CBC
HCT: 29.1 % — ABNORMAL LOW (ref 39.0–52.0)
Hemoglobin: 9.6 g/dL — ABNORMAL LOW (ref 13.0–17.0)
MCH: 28 pg (ref 26.0–34.0)
MCHC: 33 g/dL (ref 30.0–36.0)
MCV: 84.8 fL (ref 80.0–100.0)
Platelets: 386 10*3/uL (ref 150–400)
RBC: 3.43 MIL/uL — ABNORMAL LOW (ref 4.22–5.81)
RDW: 16.6 % — ABNORMAL HIGH (ref 11.5–15.5)
WBC: 7.2 10*3/uL (ref 4.0–10.5)
nRBC: 0 % (ref 0.0–0.2)

## 2021-10-04 LAB — GLUCOSE, CAPILLARY
Glucose-Capillary: 111 mg/dL — ABNORMAL HIGH (ref 70–99)
Glucose-Capillary: 208 mg/dL — ABNORMAL HIGH (ref 70–99)

## 2021-10-04 MED ORDER — ACETAMINOPHEN 325 MG PO TABS
650.0000 mg | ORAL_TABLET | Freq: Four times a day (QID) | ORAL | Status: DC | PRN
  Administered 2021-10-04: 650 mg via ORAL
  Filled 2021-10-04: qty 2

## 2021-10-04 NOTE — Discharge Instructions (Signed)
Angel Shelton, ? ?You were in the hospital with symptomatic anemia from GI bleeding. You received a blood transfusion and end upper endoscopy that showed an ulcer in your small intestine. You will need a repeat endoscopy as an outpatient. Please stop your trial medications regarding your stroke. You have been started on Protonix to reduce the acid levels in your stomach which should help with ulcer healing. ?

## 2021-10-04 NOTE — Plan of Care (Signed)
  Problem: Education: Goal: Knowledge of General Education information will improve Description: Including pain rating scale, medication(s)/side effects and non-pharmacologic comfort measures Outcome: Adequate for Discharge   Problem: Health Behavior/Discharge Planning: Goal: Ability to manage health-related needs will improve Outcome: Adequate for Discharge   Problem: Clinical Measurements: Goal: Ability to maintain clinical measurements within normal limits will improve Outcome: Adequate for Discharge Goal: Will remain free from infection Outcome: Adequate for Discharge Goal: Diagnostic test results will improve Outcome: Adequate for Discharge Goal: Respiratory complications will improve Outcome: Adequate for Discharge Goal: Cardiovascular complication will be avoided Outcome: Adequate for Discharge   Problem: Activity: Goal: Risk for activity intolerance will decrease Outcome: Adequate for Discharge   Problem: Nutrition: Goal: Adequate nutrition will be maintained Outcome: Adequate for Discharge   Problem: Coping: Goal: Level of anxiety will decrease Outcome: Adequate for Discharge   Problem: Elimination: Goal: Will not experience complications related to bowel motility Outcome: Adequate for Discharge Goal: Will not experience complications related to urinary retention Outcome: Adequate for Discharge   Problem: Safety: Goal: Ability to remain free from injury will improve Outcome: Adequate for Discharge   Problem: Skin Integrity: Goal: Risk for impaired skin integrity will decrease Outcome: Adequate for Discharge   

## 2021-10-04 NOTE — Discharge Summary (Signed)
?Physician Discharge Summary ?  ?Patient: Angel Shelton MRN: [id number] DOB: [birth year]  ?Admit date:     10/01/2021  ?Discharge date: 10/04/21  ?Discharge Physician: Cordelia Poche, MD  ? ?PCP: Ginger Organ., MD  ? ?Recommendations at discharge:  ?Follow-up with GI for repeat upper endoscopy ?Will need to follow-up with neurology for consideration of restarting ASPIRE trial medication regimen ?Follow-up duodenal ulcer biopsy ? ?Discharge Diagnoses: ?Principal Problem: ?  Acute GI bleeding ?Active Problems: ?  Hypotension ?  DM2 (diabetes mellitus, type 2) ([place]) ?  Dyslipidemia associated with type 2 diabetes mellitus ([place]) ?  Atrial fibrillation, permanent ([place]) ?  History of CVA (cerebrovascular accident) ?  AKI (acute kidney injury) ([place]) ?  Hyponatremia ?  Hyperkalemia ?  Esophageal stricture ? ?Resolved Problems: ?  Hypokalemia ? ?Hospital Course: ?Angel Shelton is a 66 y.o. male with a history of persistent atrial fibrillation, hypertension, diabetes, right kidney neoplasm s/p partial nephrectomy, recent intracerebral hemorrhagic stroke. Patient presented with dizziness for one week and was found to have evidence of symptomatic anemia. Concern for GI bleeding. pRBC transfused. GI consulted and upper endoscopy performed, revealing non-bleeding duodenal ulcers. ? ?Assessment and Plan: ? ?Acute GI bleeding ?Baseline hemoglobin of about 12-13. Hemoglobin on admission of 6.1 down to 5.4 in setting of dark stool concerning for melena. FOBT positive. Complicated by patient enrolled in clinical trial for recent stroke; unsure if on aspirin vs Eliquis. Patient transfused a total of 2 units of pRBC with post-transfusion hemoglobin of 7.9 up to as high as 9.2. GI performed upper endoscopy on 5/10 which was significant for a non-bleeding duodenal ulcers. Recommendation for repeat endoscopy in 2-3 weeks to ensure healing ulcer, Protonix 40 mg BID x8 weeks. Biopsy obtained for H. Pylori testing. ?   ?Hypotension ?Presented with soft BP of 90s/50s. Secondary to acute GI bleeding and resultant symptomatic anemia. Antihypertensives held on admission and patient transfused. Now resolved. ?  ?Primary hypertension ?Patient is on Cardizem CD, lisinopril-hydrochlorothiazide and Toprol XL as an outpatient, which were held secondary to hypotension on admission in setting of acute GI bleeding. Outpatient antihypertensives held. Blood pressure now rebounding. Resumed home Toprol XL. ?  ?Hyperkalemia ?Follow repeat after transfusion ?Also has received 3 units of insulin with his hyperglycemia ?  ?Hyponatremia ?Mild after correcting for hyperglycemia. Due to hypovolemia. Will follow after transfusion.  ?  ?AKI (acute kidney injury) ([place]) ?Baseline creatinine of about 1. Creatinine of 2.20 on admission with peak of 2.4 in setting of hypotension/hypovolemia. Improved with fluid resuscitation/blood transfusion. ?  ?History of CVA (cerebrovascular accident) ?Recent right frontal intracerebral hemorrhage stroke in January. Patient was enrolled in a clinical trial (ASPIRE) and is either on aspirin or Eliquis prior to admission. Neurology consulted for recommendations secondary to current GI bleed from duodenal ulcer. Recommendation to hold anticoagulation until ulcer has stabilized or if H. Pylori testing is positive. ?  ?Atrial fibrillation, permanent ([place]) ?CHA2DS2-VASc score of 5. Patient is in a clinical trial as mentioned above, relating to his anticoagulation. Toprol XL was held on admission. Resume Toprol XL. ?  ?Dyslipidemia associated with type 2 diabetes mellitus ([place]) ?Continue statin ?  ?DM2 (diabetes mellitus, type 2) ([name]) ?Controlled. Last hemoglobin A1C of 6.2%. Patient is on metformin as an outpatient, which were held on admission. SSI started while inpatient. ?  ?Esophageal stricture ?Benign appearing per EGD report. Noticed incidentally. Dilated. ?  ?Hypokalemia-resolved as of 10/01/2021 ? ? ?  ? ?Consultants:  [facility], Neurology ?Procedures  performed: Upper endoscopy  ?Disposition: Home ?Diet recommendation:  ?Carb modified diet ? ? ?DISCHARGE MEDICATION: ?Allergies as of 10/04/2021   ?No Known Allergies ?  ? ?  ?Medication List  ?  ? ?STOP taking these medications   ? ?aspirin 81 MG chewable tablet ?  ?atorvastatin 80 MG tablet ?Commonly known as: LIPITOR ?  ?Eliquis 5 MG Tabs tablet ?Generic drug: apixaban ?  ? ?  ? ?TAKE these medications   ? ?Benefiber Drink Mix Pack ?Take 1 Package by mouth daily. ?  ?diltiazem 240 MG 24 hr capsule ?Commonly known as: CARDIZEM CD ?Take 240 mg by mouth daily. ?  ?fenofibrate 160 MG tablet ?Take 160 mg by mouth daily. ?  ?Fish Oil 1000 MG Caps ?Take 1,200 mg by mouth daily. ?  ?gabapentin 300 MG capsule ?Commonly known as: NEURONTIN ?Take 300 mg by mouth 3 (three) times daily. Pt takes two 300 mg capsules in the AM, then, pt takes one 300 mg capsule in the PM ?  ?lisinopril-hydrochlorothiazide 20-12.5 MG tablet ?Commonly known as: ZESTORETIC ?Take 1 tablet by mouth daily. ?  ?metFORMIN 1000 MG tablet ?Commonly known as: GLUCOPHAGE ?Take 1,000 mg by mouth 2 (two) times daily. ?  ?metoprolol succinate 100 MG 24 hr tablet ?Commonly known as: TOPROL-XL ?Take 100 mg by mouth daily. ?  ?pantoprazole 40 MG tablet ?Commonly known as: Protonix ?Take 1 tablet (40 mg total) by mouth 2 (two) times daily. ?  ?PROBIOTIC DIGESTIVE SUPPORT PO ?Take 1 tablet by mouth daily. ?  ?rosuvastatin 40 MG tablet ?Commonly known as: CRESTOR ?Take 40 mg by mouth daily. ?  ?sildenafil 100 MG tablet ?Commonly known as: VIAGRA ?Take 100 mg by mouth daily as needed. ?  ?VITAMIN B-12 PO ?Take 1 tablet by mouth daily. ?  ? ?  ? ? Follow-up Information   ? ? Ginger Organ., MD. Schedule an appointment as soon as possible for a visit in 1 week(s).   ?Specialty: Internal Medicine ?Why: For hospital follow-up ?Contact information: ?[address] ?[place] [address] ?[phone] ? ? ?  ?  ? ?  [name], MD. Go on 10/25/2021.   ?Specialty: Gastroenterology ?Why: 10:00AM ?Contact information: ?[address] ?[address] ?[place] [address] ?[phone] ? ? ?  ?  ? ?  ?  ? ?  ? ?Discharge Exam: ?BP (!) 145/90 (BP Location: Right Arm)   Pulse 95   Temp 98.3 ?F (36.8 ?C) (Oral)   Resp 16   Ht '5\' 10"'$  (1.778 m)   Wt 78.9 kg   SpO2 98%   BMI 24.96 kg/m?  ? ?General: Well appearing, no distress ? ?Condition at discharge: stable ? ?The results of significant diagnostics from this hospitalization (including imaging, microbiology, ancillary and laboratory) are listed below for reference.  ? ?Imaging Studies: ?US Abdomen Limited ? ?Result Date: 10/02/2021 ?CLINICAL DATA:  Alcohol abuse, anemia and melena. EXAM: ULTRASOUND ABDOMEN LIMITED RIGHT UPPER QUADRANT COMPARISON:  Prior renal ultrasound on 06/01/2021 FINDINGS: Gallbladder: Shadowing gallstone in the central gallbladder lumen. The gallbladder is nondistended. Mildly thickened appearance of the gallbladder wall measures 4 mm. No sonographic Murphy sign. Common bile duct: Diameter: 4 mm Liver: The liver demonstrates coarse echotexture and increased echogenicity, likely reflecting diffuse steatosis. No overt cirrhotic contour abnormalities or focal lesions are identified. There is no evidence of intrahepatic biliary ductal dilatation. Portal vein is patent on color Doppler imaging with normal direction of blood flow towards the liver. Other: No ascites  is visualized in the right upper quadrant. Incidental cystic lesions of the upper right kidney seen previously and also previously characterized by MRI. IMPRESSION: 1. Cholelithiasis with single central gallstone present. The gallbladder is relatively contracted with mildly thickened appearance of the gallbladder wall. Component of chronic inflammation/chronic cholecystitis is not completely excluded but the patient was not tender over the region of the gallbladder during the exam. 2. Echogenic liver  likely reflecting diffuse steatosis. No overt cirrhotic morphology is identified by ultrasound. Electronically Signed   By: Aletta Edouard M.D.   On: 10/02/2021 11:19   ? ?Microbiology: ?Results for orders placed or performed durin

## 2021-10-04 NOTE — TOC Transition Note (Signed)
Transition of Care (TOC) - CM/SW Discharge Note ? ? ?Patient Details  ?Name: Angel Shelton ?MRN: [id number] ?Date of Birth: Feb 09, 1956 ? ?Transition of Care (TOC) CM/SW Contact:  ?Cyndi Bender, RN ?Phone Number: ?10/04/2021, 12:20 PM ? ? ?Clinical Narrative:    ?Patient stable for discharge. Patient has his car and can drive himself home. ?No other TOC needs ? ? ?Final next level of care: Home/Self Care ?Barriers to Discharge: Barriers Resolved ? ? ?Patient Goals and CMS Choice ?Patient states their goals for this hospitalization and ongoing recovery are:: go home ?  ?  ? ?Discharge Placement ?  ?           ? home ?  ?  ?  ? ?Discharge Plan and Services ?  ?  ?          home ?  ?  ?  ?  ?  ?  ?  ?  ?  ?  ? ?Social Determinants of Health (SDOH) Interventions ?  ? ? ?Readmission Risk Interventions ? ?  10/04/2021  ? 12:20 PM  ?Readmission Risk Prevention Plan  ?Transportation Screening Complete  ?PCP or Specialist Appt within 5-7 Days Complete  ?Home Care Screening Complete  ?Medication Review (RN CM) Complete  ? ? ? ? ? ?

## 2021-10-04 NOTE — Anesthesia Postprocedure Evaluation (Signed)
Anesthesia Post Note ? ?Patient: Angel Shelton ? ?Procedure(s) Performed: ESOPHAGOGASTRODUODENOSCOPY (EGD) WITH PROPOFOL ?BIOPSY ?BALLOON DILATION ? ?  ? ?Patient location during evaluation: Endoscopy ?Anesthesia Type: MAC ?Level of consciousness: awake and alert ?Pain management: pain level controlled ?Vital Signs Assessment: post-procedure vital signs reviewed and stable ?Respiratory status: spontaneous breathing, nonlabored ventilation, respiratory function stable and patient connected to nasal cannula oxygen ?Cardiovascular status: stable and blood pressure returned to baseline ?Postop Assessment: no apparent nausea or vomiting ?Anesthetic complications: no ? ? ?No notable events documented. ? ?Last Vitals:  ?Vitals:  ? 10/03/21 1612 10/03/21 1659  ?BP: 135/82   ?Pulse: 96 (!) 103  ?Resp: 15   ?Temp: 36.4 ?C   ?SpO2: 100%   ?  ?Last Pain:  ?Vitals:  ? 10/03/21 2000  ?TempSrc:   ?PainSc: 0-No pain  ? ? ?  ?  ?  ?  ?  ?  ? ?[place] ? ? ? ? ?

## 2021-10-04 NOTE — Telephone Encounter (Signed)
Patient has been scheduled for an EGD in the [facility] on 10/25/21 at 10 am. Pt will need to arrive in the [facility] by 9 am with a care partner. Amy Esterwood notified of appt to notify patient. Will mail patient his instructions. Will call pt tomorrow to discuss appt information.  ? ?Ambulatory referral to GI in epic. ?

## 2021-10-04 NOTE — Progress Notes (Signed)
Patient is refusing Telemetry, will notify Attending. ?

## 2021-10-04 NOTE — Progress Notes (Addendum)
Patient ID: Angel Shelton, male   DOB: 1955-05-31, 66 y.o.   MRN: [id number] ? ? ? Progress Note ? ? Subjective  ? Day # 3 ? CC; acute GI bleed ? ?EGD yesterday-mild hiatal hernia, benign distal stricture dilated, normal-appearing stomach, 2 nonbleeding cratered duodenal ulcers found in the duodenal bulb largest 6 to 8 mm, may have been a small red flat spot on one of the ulcers, some friability and edema adjacent-biopsies pending ? ?Patient feels good, no complaints of abdominal discomfort or nausea, says he had a bowel movement this morning which was normal ?He is hoping to go home today ? ?Decision made for his study drug to be placed on hold for the next couple of weeks until he has repeat EGD to confirm healing. ? ?Labs today-hemoglobin 9.6/hematocrit 29.1 stable ?Sodium 130/potassium 4.1/BUN 15/creatinine 1.39 ? ? Objective  ? ?Vital signs in last 24 hours: ?Temp:  [97.6 ?F (36.4 ?C)-98.3 ?F (36.8 ?C)] 98.3 ?F (36.8 ?C) (05/11 0746) ?Pulse Rate:  [64-103] 95 (05/11 0746) ?Resp:  [13-20] 16 (05/11 0746) ?BP: (120-154)/(79-90) 145/90 (05/11 0746) ?SpO2:  [98 %-100 %] 98 % (05/11 0746) ?Last BM Date : 10/02/21 ?General:    Older white male in NAD ?Heart:  Regular rate and rhythm; no murmurs ?Lungs: Respirations even and unlabored, lungs CTA bilaterally ?Abdomen:  Soft, nontender and nondistended. Normal bowel sounds. ?Extremities:  Without edema. ?Neurologic:  Alert and oriented,  grossly normal neurologically. ?Psych:  Cooperative. Normal mood and affect. ? ?Intake/Output from previous day: ?05/10 0701 - 05/11 0700 ?In: 100 [I.V.:100] ?Out: -  ?Intake/Output this shift: ?No intake/output data recorded. ? ?Lab Results: ?Recent Labs  ?  10/02/21 ?1300 10/02/21 ?1936 10/03/21 ?0543 10/03/21 ?1912 10/04/21 ?0603  ?WBC 6.4  --  6.3  --  7.2  ?HGB 8.7*   < > 9.2* 8.6* 9.6*  ?HCT 26.5*   < > 28.1* 26.4* 29.1*  ?PLT 326  --  332  --  386  ? < > = values in this interval not displayed.  ? ?BMET ?Recent Labs  ?   10/02/21 ?0500 10/03/21 ?2633 10/04/21 ?3545  ?NA 131* 133* 130*  ?K 4.1 3.7 4.1  ?CL 101 100 98  ?CO2 '24 25 24  '$ ?GLUCOSE 117* 112* 127*  ?BUN 29* 15 15  ?CREATININE 1.81* 1.23 1.39*  ?CALCIUM 9.1 9.6 9.4  ? ?LFT ?Recent Labs  ?  10/01/21 ?1717  ?PROT 6.1*  ?ALBUMIN 3.5  ?AST 15  ?ALT 11  ?ALKPHOS 26*  ?BILITOT 0.8  ? ?PT/INR ?Recent Labs  ?  10/01/21 ?1642 10/03/21 ?0543  ?LABPROT 18.2* 15.5*  ?INR 1.5* 1.2  ? ? ?Studies/Results: ?US Abdomen Limited ? ?Result Date: 10/02/2021 ?CLINICAL DATA:  Alcohol abuse, anemia and melena. EXAM: ULTRASOUND ABDOMEN LIMITED RIGHT UPPER QUADRANT COMPARISON:  Prior renal ultrasound on 06/01/2021 FINDINGS: Gallbladder: Shadowing gallstone in the central gallbladder lumen. The gallbladder is nondistended. Mildly thickened appearance of the gallbladder wall measures 4 mm. No sonographic Murphy sign. Common bile duct: Diameter: 4 mm Liver: The liver demonstrates coarse echotexture and increased echogenicity, likely reflecting diffuse steatosis. No overt cirrhotic contour abnormalities or focal lesions are identified. There is no evidence of intrahepatic biliary ductal dilatation. Portal vein is patent on color Doppler imaging with normal direction of blood flow towards the liver. Other: No ascites is visualized in the right upper quadrant. Incidental cystic lesions of the upper right kidney seen previously and also previously characterized by MRI. IMPRESSION: 1. Cholelithiasis with single  central gallstone present. The gallbladder is relatively contracted with mildly thickened appearance of the gallbladder wall. Component of chronic inflammation/chronic cholecystitis is not completely excluded but the patient was not tender over the region of the gallbladder during the exam. 2. Echogenic liver likely reflecting diffuse steatosis. No overt cirrhotic morphology is identified by ultrasound. Electronically Signed   By: Aletta Edouard M.D.   On: 10/02/2021 11:19   ? ? ? ? Assessment / Plan:    ? ?#19 66 year old white male with acute/subacute GI bleed presenting with anemia and heme positive stool in setting of antiplatelet therapy/study drug aspirin versus Eliquis for prior CVA in December 2022. ? ?Patient found to have 2 cratered duodenal ulcers on EGD yesterday, nonbleeding, 1 may have had a flat red spot, no endoscopic therapy. ?Biopsies for H. pylori pending ? ?Also noted to have a distal esophageal stricture which was dilated ? ?Patient is stable today, hemoglobin stable, brown stool ? ?#2 atrial fibrillation ? ?Plan; patient is stable from GI perspective to be discharged home today ?Please discharge on Protonix 40 mg p.o. twice daily ?He is to remain off of study drug over the next couple of weeks until repeat EGD ? ?Patient has been scheduled for follow-up EGD with Dr. Loletha Carrow at our office on June 1 at 10 AM.  Arrive at 9 AM, n.p.o. after midnight. ? ?Patient advised to call if he has any problems in the interim, development of melena, weakness , abdominal pain ? ?Stop EtOH ? ? ?Principal Problem: ?  Acute GI bleeding ?Active Problems: ?  DM2 (diabetes mellitus, type 2) ([place]) ?  Dyslipidemia associated with type 2 diabetes mellitus ([place]) ?  Atrial fibrillation, permanent ([place]) ?  Hypotension ?  History of CVA (cerebrovascular accident) ?  AKI (acute kidney injury) ([place]) ?  Hyponatremia ?  Hyperkalemia ? ? ? ? LOS: 2 days  ? ?Amy Esterwood PA-C 10/04/2021, 10:49 AM ?  ?I have taken an interval history, thoroughly reviewed the chart and examined the patient. I agree with the Advanced Practitioner's note, impression and recommendations, and have recorded additional findings, impressions and recommendations below. ?I performed a substantive portion of this encounter (>50% time spent), including a complete performance of the medical decision making. ? ?My additional thoughts are as follows: ? ?No further bleeding from duodenal ulcer.  Patient is to be discharged today on twice daily PPI, iron supplement  and we will arrange follow-up in our office. ? ?I had a lengthy discussion with Dr. Leonie Man of neurology yesterday as reflected in his progress note regarding management of this patient's neurologic study medicine.  We do not know if he is on aspirin or Eliquis, and neurology decided the patient did not meet criteria to break study protocol to determine which of these medicines he has been taking. ?Since we therefore cannot know the risk of further ulcer progression (if aspirin) or bleeding (either medicine) without that information, my recommendation was to keep him off the medicine altogether until a repeat upper endoscopy in 3 weeks.  While duodenal ulcers may take as long as 6 to 8 weeks to heal before usual repeat upper endoscopy is performed, his ulcers were relatively small and may significantly heal up sooner, thus the timing of my recommendation. ?He will remain on twice daily PPI until complete ulcer healing as expected.  Neurology discussed the risk of CVA with this patient since he will be off the study medicine for at least 3 weeks, and he was agreeable to that. ? ?  We have set up his repeat upper endoscopy for June 1 and he will hear from my office within a day or 2 after discharge with further instructions. ? ?We will also arrange a CBC in my office in 7 to 10 days. ? ? ?35 minutes were spent on this encounter (including chart review, history/exam, counseling/coordination of care, and documentation) > 50% of that time was spent on counseling and coordination of care. ? ? ? ?Nelida Meuse III ?[phone] ? ?

## 2021-10-04 NOTE — Telephone Encounter (Signed)
-----   Message from Alfredia Ferguson, PA-C sent at 10/04/2021  8:59 AM EDT ----- ?Regarding: schedule EGD ?Onyx Schirmer, this patient is currently inpatient, probably to go home today needs to be scheduled for EGD in the [facility] on June 1 with Dr. Loletha Carrow.  Dr. Loletha Carrow has looked at his schedule and has openings on that day please go ahead and get that scheduled and if you want to secure chat me with any time I can let the patient know. ?We will need you to call him tomorrow or Monday with the details regarding the procedure time to arrive n.p.o. etc. ? ?Is for follow-up of acute GI bleed with duodenal ulcer in setting of anticoagulation which will be on hold until he has the procedure done. ?Thank you ? ?

## 2021-10-04 NOTE — Telephone Encounter (Signed)
[name], MD  Yevette Edwards, RN ?Angel Shelton,  ? ?I believe Amy contacted you about arranging this patient's follow-up EGD after hospital discharge.  ? ?The patient was discharged today before I could also recommend that he start an iron supplement and have a repeat hemoglobin check with [place].  ? ?When you talk with him in a day or 2 regarding the instructions for EGD in the [place], please recommend that he start a 325 mg iron sulfate over-the-counter tablet twice daily, and let him know that we will turn his stool black so he has not alarmed.  ? ?He also needs a CBC at our lab May 22 or 23.  ? ?Thank you  ? ?HD   ? ?Lab reminder in epic. ?

## 2021-10-05 LAB — TYPE AND SCREEN
ABO/RH(D): A POS
Antibody Screen: POSITIVE
Donor AG Type: NEGATIVE
Donor AG Type: NEGATIVE
Donor AG Type: NEGATIVE
Unit division: 0
Unit division: 0
Unit division: 0
Unit division: 0

## 2021-10-05 LAB — BPAM RBC
Blood Product Expiration Date: 202305162359
Blood Product Expiration Date: 202306092359
Blood Product Expiration Date: 202306092359
Blood Product Expiration Date: 202306122359
ISSUE DATE / TIME: 202305082052
ISSUE DATE / TIME: 202305082340
Unit Type and Rh: 5100
Unit Type and Rh: 6200
Unit Type and Rh: 6200
Unit Type and Rh: 6200

## 2021-10-05 LAB — SURGICAL PATHOLOGY

## 2021-10-05 MED ORDER — IRON (FERROUS SULFATE) 325 (65 FE) MG PO TABS: 1.0000 | ORAL_TABLET | Freq: Two times a day (BID) | ORAL | 0 refills | Status: DC

## 2021-10-05 NOTE — Telephone Encounter (Signed)
Lm on vm for patient to return call 

## 2021-10-08 NOTE — Telephone Encounter (Signed)
Called and spoke with patient regarding appt and recommendations below. Pt is aware of his appt information and he knows to expect his instructions in the mail soon. Pt has been advised to being Iron sulfate 325 mg BID OTC. Pt is aware that he will need repeat labs on 5/22 or 5/23. Pt verbalized understanding and had no concerns at the end of the call.  ? ?Lab order in epic. ?

## 2021-10-15 ENCOUNTER — Other Ambulatory Visit (INDEPENDENT_AMBULATORY_CARE_PROVIDER_SITE_OTHER): Payer: Medicare Other

## 2021-10-15 ENCOUNTER — Telehealth: Payer: Self-pay

## 2021-10-15 DIAGNOSIS — Z7901 Long term (current) use of anticoagulants: Secondary | ICD-10-CM | POA: Diagnosis not present

## 2021-10-15 DIAGNOSIS — K922 Gastrointestinal hemorrhage, unspecified: Secondary | ICD-10-CM

## 2021-10-15 LAB — CBC
HCT: 28 % — ABNORMAL LOW (ref 39.0–52.0)
Hemoglobin: 9.3 g/dL — ABNORMAL LOW (ref 13.0–17.0)
MCHC: 33.2 g/dL (ref 30.0–36.0)
MCV: 82.9 fl (ref 78.0–100.0)
Platelets: 277 10*3/uL (ref 150.0–400.0)
RBC: 3.38 Mil/uL — ABNORMAL LOW (ref 4.22–5.81)
RDW: 19.9 % — ABNORMAL HIGH (ref 11.5–15.5)
WBC: 5.8 10*3/uL (ref 4.0–10.5)

## 2021-10-15 NOTE — Telephone Encounter (Signed)
-----   Message from Yevette Edwards, RN sent at 10/04/2021 12:11 PM EDT ----- Regarding: Labs CBC/diff - need to enter order

## 2021-10-15 NOTE — Telephone Encounter (Signed)
Spoke with patient to remind him that he is due for repeat labs at this time. No appointment is necessary. Patient is aware that he can stop by the lab in the basement at his convenience between 7:30 AM - 5 PM today or tomorrow. Patient verbalized understanding and had no concerns at the end of the call.

## 2021-10-24 DIAGNOSIS — I450 Right fascicular block: Secondary | ICD-10-CM | POA: Diagnosis not present

## 2021-10-24 DIAGNOSIS — E11319 Type 2 diabetes mellitus with unspecified diabetic retinopathy without macular edema: Secondary | ICD-10-CM | POA: Diagnosis not present

## 2021-10-24 DIAGNOSIS — I10 Essential (primary) hypertension: Secondary | ICD-10-CM | POA: Diagnosis not present

## 2021-10-24 DIAGNOSIS — E785 Hyperlipidemia, unspecified: Secondary | ICD-10-CM | POA: Diagnosis not present

## 2021-10-25 ENCOUNTER — Encounter: Payer: Self-pay | Admitting: Gastroenterology

## 2021-10-25 ENCOUNTER — Ambulatory Visit (AMBULATORY_SURGERY_CENTER): Payer: Medicare Other | Admitting: Gastroenterology

## 2021-10-25 VITALS — BP 128/77 | HR 79 | Temp 97.8°F | Resp 10 | Ht 71.0 in | Wt 182.0 lb

## 2021-10-25 DIAGNOSIS — K449 Diaphragmatic hernia without obstruction or gangrene: Secondary | ICD-10-CM

## 2021-10-25 DIAGNOSIS — K264 Chronic or unspecified duodenal ulcer with hemorrhage: Secondary | ICD-10-CM

## 2021-10-25 DIAGNOSIS — K269 Duodenal ulcer, unspecified as acute or chronic, without hemorrhage or perforation: Secondary | ICD-10-CM | POA: Diagnosis not present

## 2021-10-25 DIAGNOSIS — K2970 Gastritis, unspecified, without bleeding: Secondary | ICD-10-CM

## 2021-10-25 DIAGNOSIS — K3189 Other diseases of stomach and duodenum: Secondary | ICD-10-CM | POA: Diagnosis not present

## 2021-10-25 DIAGNOSIS — D62 Acute posthemorrhagic anemia: Secondary | ICD-10-CM | POA: Diagnosis not present

## 2021-10-25 DIAGNOSIS — K222 Esophageal obstruction: Secondary | ICD-10-CM

## 2021-10-25 MED ORDER — PANTOPRAZOLE SODIUM 40 MG PO TBEC: DELAYED_RELEASE_TABLET | ORAL | 3 refills | Status: DC

## 2021-10-25 MED ORDER — SODIUM CHLORIDE 0.9 % IV SOLN: 500.0000 mL | Freq: Once | INTRAVENOUS | Status: DC

## 2021-10-25 NOTE — Progress Notes (Signed)
History and Physical:  This patient presents for endoscopic testing for: Encounter Diagnoses  Name Primary?   Duodenal ulcer with hemorrhage Yes   Acute blood loss anemia     66 year old man known to me from recent inpatient evaluation, when he presented with Acute GI bleeding.  Upper endoscopy revealed 2 duodenal ulcers with stigmata of bleeding.  No endoscopic intervention required.  This patient had been on a study medication in a blinded clinical trial having had a hemorrhagic conversion of the CVA in December 2022.  That study medication is either aspirin or Eliquis, however it is unknown which she was taking since the study protocol was not broken after consultation with his neurologist Leonie Man).  He has been off this medication since the upper endoscopy on 10/03/2021, and today's evaluation is to assess for ulcer healing with further recommendations afterward regarding resuming this medication.  He has been on twice daily pantoprazole, and his inpatient gastric biopsy was negative for H. pylori.  His hemoglobin was 9.3 on 10/15/2021  Patient is otherwise without complaints or active issues today.   Past Medical History: Past Medical History:  Diagnosis Date   Alcohol abuse    Diabetes mellitus without complication (HCC)    Diverticulosis    GERD (gastroesophageal reflux disease)    Hyperlipidemia    Hypertension    Left atrial enlargement    Neuromuscular disorder (HCC)    neuropathy   Persistent atrial fibrillation (HCC)    CHA2DS2-VASc score 4 (age-25, HTN-1, DM 2-1, aortic plaque)   Stroke ([place]) 04/2021     Past Surgical History: Past Surgical History:  Procedure Laterality Date   APPENDECTOMY     BALLOON DILATION N/A 10/03/2021   Procedure: BALLOON DILATION;  Surgeon: [name], MD;  Location: [place];  Service: Gastroenterology;  Laterality: N/A;   BIOPSY  10/03/2021   Procedure: BIOPSY;  Surgeon: [name], MD;  Location: [place];  Service:  Gastroenterology;;   CARDIAC CATHETERIZATION N/A 01/05/2015   Procedure: Left Heart Cath and Coronary Angiography;  Surgeon: Pixie Casino, MD;  Location: [facility];  Service: Cardiovascular;; (For Abnormal Nuclear Stress Test) mild luminal irregularities of the LCx with mild areas of ectasia.  Calcified ostial D1 mild stenosis. ->  Mild/nonobstructive CAD.   CARDIOVERSION N/A 11/01/2014   Procedure: CARDIOVERSION;  Surgeon: Pixie Casino, MD;  Location: [facility];  Service: Cardiovascular;  Laterality: N/A;   CARDIOVERSION N/A 02/24/2015   Procedure: CARDIOVERSION;  Surgeon: Pixie Casino, MD;  Location: [facility];  Service: Cardiovascular;  Laterality: N/A;   CATARACT EXTRACTION Bilateral    COLON SURGERY N/A 2006   14 inches removed from diverticitis perforation   COLONOSCOPY     Per stoma   ESOPHAGOGASTRODUODENOSCOPY (EGD) WITH PROPOFOL N/A 10/03/2021   Procedure: ESOPHAGOGASTRODUODENOSCOPY (EGD) WITH PROPOFOL;  Surgeon: [name], MD;  Location: [facility];  Service: Gastroenterology;  Laterality: N/A;   EYE SURGERY Bilateral    Cat Sx   INNER EAR SURGERY     as a child   NM MYOVIEW LTD  12/08/2014   FALSE POSITIVE: Read asINTERMEDIATE RISK.  Partially reversible medium size moderate severity defect in the basal--mid-apical inferoseptal-inferior and apical walls.  Not gated due to A. fib. ->  Referred for cath- NON-OBSTRUCTIVE CAD   ROBOTIC ASSITED PARTIAL NEPHRECTOMY Right 05/23/2021   Procedure: XI RETROPERITONEAL ROBOTIC ASSITED PARTIAL NEPHRECTOMY;  Surgeon: Alexis Frock, MD;  Location: [facility];  Service: Urology;  Laterality:  Right;   TEE WITHOUT CARDIOVERSION N/A 11/01/2014   Procedure: TRANSESOPHAGEAL ECHOCARDIOGRAM (TEE) -unsuccesful DCCV;  Surgeon: Pixie Casino, MD;  Location: [facility];  Service: Cardiovascular;; Mild concentric LVH.  EF 55 to 60%.  Normal wall motion.  Grade 3 aortic atheroma.  No source of cardiac emboli.  -->  Followed  by unsuccessful DCCV.    Allergies: No Known Allergies  Outpatient Meds: Current Outpatient Medications  Medication Sig Dispense Refill   Cyanocobalamin (VITAMIN B-12 PO) Take 1 tablet by mouth daily.     diltiazem (CARDIZEM CD) 240 MG 24 hr capsule Take 240 mg by mouth daily.     fenofibrate 160 MG tablet Take 160 mg by mouth daily.     gabapentin (NEURONTIN) 300 MG capsule Take 300 mg by mouth 3 (three) times daily. Pt takes two 300 mg capsules in the AM, then, pt takes one 300 mg capsule in the PM     Iron, Ferrous Sulfate, 325 (65 Fe) MG TABS Take 1 tablet by mouth 2 (two) times daily. 60 tablet 0   Lactobacillus-Inulin (PROBIOTIC DIGESTIVE SUPPORT PO) Take 1 tablet by mouth daily.     lisinopril-hydrochlorothiazide (ZESTORETIC) 20-12.5 MG tablet Take 1 tablet by mouth daily.     metFORMIN (GLUCOPHAGE) 1000 MG tablet Take 1,000 mg by mouth 2 (two) times daily.     metoprolol succinate (TOPROL-XL) 100 MG 24 hr tablet Take 100 mg by mouth daily.  1   Omega-3 Fatty Acids (FISH OIL) 1000 MG CAPS Take 1,200 mg by mouth daily.     pantoprazole (PROTONIX) 40 MG tablet Take 1 tablet (40 mg total) by mouth 2 (two) times daily. 112 tablet 0   rosuvastatin (CRESTOR) 40 MG tablet Take 40 mg by mouth daily.     sildenafil (VIAGRA) 100 MG tablet Take 100 mg by mouth daily as needed.     Wheat Dextrin (BENEFIBER DRINK MIX) PACK Take 1 Package by mouth daily.     Current Facility-Administered Medications  Medication Dose Route Frequency Provider Last Rate Last Admin   0.9 %  sodium chloride infusion  500 mL Intravenous Once [name], MD          ___________________________________________________________________ Objective   Exam:  BP (!) 145/72   Pulse 80   Temp 97.8 F (36.6 C) (Temporal)   Resp 10   Ht '5\' 11"'$  (1.803 m)   Wt 182 lb (82.6 kg)   SpO2 95%   BMI 25.38 kg/m   CV: RRR without murmur, S1/S2 Resp: clear to auscultation bilaterally, normal RR and effort  noted GI: soft, no tenderness, with active bowel sounds.   Assessment: Encounter Diagnoses  Name Primary?   Duodenal ulcer with hemorrhage Yes   Acute blood loss anemia      Plan:  EGD  The benefits and risks of the planned procedure were described in detail with the patient or (when appropriate) their health care proxy.  Risks were outlined as including, but not limited to, bleeding, infection, perforation, adverse medication reaction leading to cardiac or pulmonary decompensation, pancreatitis (if ERCP).  The limitation of incomplete mucosal visualization was also discussed.  No guarantees or warranties were given.    The patient is appropriate for an endoscopic procedure in the ambulatory setting.   - Wilfrid Lund, MD

## 2021-10-25 NOTE — Op Note (Signed)
[facility] Patient Name: Angel Shelton Procedure Date: 10/25/2021 9:37 AM MRN: [id number] Endoscopist: Mallie Mussel L. 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 Medicines:                Monitored Anesthesia Care Procedure:                Pre-Anesthesia Assessment:                           - Prior to the procedure, a History and Physical                            was performed, and patient medications and                            allergies were reviewed. The patient's tolerance of                            previous anesthesia was also reviewed. The risks                            and benefits of the procedure and the sedation                            options and risks were discussed with the patient.                            All questions were answered, and informed consent                            was obtained. Prior Anticoagulants: The patient has                            taken no previous anticoagulant or antiplatelet                            agents. ASA Grade Assessment: III - A patient with  severe systemic disease. After reviewing the risks                            and benefits, the patient was deemed in                            satisfactory condition to undergo the procedure.                           After obtaining informed consent, the endoscope was                            passed under direct vision. Throughout the                            procedure, the patient's blood pressure, pulse,  and                            oxygen saturations were monitored continuously. The                            Endoscope was introduced through the mouth, and                            advanced to the second part of duodenum. The upper                            GI endoscopy was accomplished without difficulty.                            The patient tolerated the procedure well. Scope In: Scope Out: Findings:                 A small hiatal hernia was present.                           One benign-appearing, intrinsic mild stenosis was                            found at the gastroesophageal junction.                           Localized mucosal changes characterized by erythema                            and friability (with contact bleeding) were found                            on the greater curvature of the gastric body.                            Biopsies were taken with a cold forceps for                            histology.  The exam of the stomach was otherwise normal.                           One non-bleeding superficial duodenal ulcer with no                            stigmata of bleeding was found in the duodenal                            bulb. The lesion was 6 mm in largest dimension.                           The exam of the duodenum was otherwise normal. Complications:            No immediate complications. Estimated Blood Loss:     Estimated blood loss was minimal. Impression:               - Small hiatal hernia.                           - Benign-appearing esophageal stenosis.                           - Erythematous and friable (with contact bleeding)                            mucosa in the greater curvature of the gastric                            body. Biopsied.                           - Non-bleeding duodenal ulcer with no stigmata of                            bleeding.                           As stated in inpatient GI progress notes,  there an                            unknown degree of risk regarding ulcer non-healing                            or recurrence if this patient resumes his study                            medicine. Risks/benefits of continuing it require                            ongoing physician-patient discussions with                            Neurology. Recommendation:           - Patient has a contact number available for  emergencies. The signs and symptoms of potential                            delayed complications were discussed with the                            patient. Return to normal activities tomorrow.                            Written discharge instructions were provided to the                            patient.                           - Resume previous diet.                           - Tomorrow, resume your medication that was                            prescrbed as part of the clinical study wth your                            neurologist.(the one that has been on hold a few                            weeks)                           Continue pantoprazole 40 mg twice daily for another                            4 weeks. Then decrease that medicine to once daily                            and take it indefinitely.                           Contact your primary care provider to have your                            blood counts and iron levels checked in 2 weeks.                            (continue iron tablets at current dose)                           - See your Neurologist in follow up. Jazzmyne Rasnick L. Loletha Carrow, MD 10/25/2021 10:16:17 AM This report has been signed electronically. CC Letter to:             Pramodkumar P. Leonie Man, MD

## 2021-10-25 NOTE — Progress Notes (Deleted)
Pt's states no medical or surgical changes since previsit or office visit. 

## 2021-10-25 NOTE — Progress Notes (Signed)
A and O x3. Report to RN. Tolerated MAC anesthesia well.Teeth unchanged after procedure. 

## 2021-10-25 NOTE — Patient Instructions (Addendum)
TOMORROW ,RESUME YOUR CLINICAL STUDY( WITH YOUR NEUROLOGIST) MEDICATION . DR DANIS SPOKE TO YOU ABOUT CALLING NEUROLOGIST TO SEE WHAT THEY WANT YOU TO DO WITH THESE MEDICATIONS   CONTINUE PANTOPRAZOLE 40 MG TWICE DAILY FOR ANOTHER 4 WEEKS ,THEN DECREASE THIS MEDICATION TO 40 MG ONCE DAILY INDEFINITELY .- NEW RX SENT TO PHARMACY  CONTACT YOUR PRIMARY CARE DR TO HAVE YOUR BLOOD COUNTS AND IRON LEVELS CHECKED IN 2 WEEKS - CONTINUE IRON TABLETS AT CURRENT DOSE  SEE YOUR NEUROLOGIST IN FOLLOW UP-     YOU HAD AN ENDOSCOPIC PROCEDURE TODAY AT [facility]:   Refer to the procedure report that was given to you for any specific questions about what was found during the examination.  If the procedure report does not answer your questions, please call your gastroenterologist to clarify.  If you requested that your care partner not be given the details of your procedure findings, then the procedure report has been included in a sealed envelope for you to review at your convenience later.  YOU SHOULD EXPECT: Some feelings of bloating in the abdomen. Passage of more gas than usual.  Walking can help get rid of the air that was put into your GI tract during the procedure and reduce the bloating. If you had a lower endoscopy (such as a colonoscopy or flexible sigmoidoscopy) you may notice spotting of blood in your stool or on the toilet paper. If you underwent a bowel prep for your procedure, you may not have a normal bowel movement for a few days.  Please Note:  You might notice some irritation and congestion in your nose or some drainage.  This is from the oxygen used during your procedure.  There is no need for concern and it should clear up in a day or so.  SYMPTOMS TO REPORT IMMEDIATELY:  Following upper endoscopy (EGD)  Vomiting of blood or coffee ground material  New chest pain or pain under the shoulder blades  Painful or persistently difficult swallowing  New shortness of  breath  Fever of 100F or higher  Black, tarry-looking stools  For urgent or emergent issues, a gastroenterologist can be reached at any hour by calling [phone]. Do not use MyChart messaging for urgent concerns.    DIET:  We do recommend a small meal at first, but then you may proceed to your regular diet.  Drink plenty of fluids but you should avoid alcoholic beverages for 24 hours.  ACTIVITY:  You should plan to take it easy for the rest of today and you should NOT DRIVE or use heavy machinery until tomorrow (because of the sedation medicines used during the test).    FOLLOW UP: Our staff will call the number listed on your records 48-72 hours following your procedure to check on you and address any questions or concerns that you may have regarding the information given to you following your procedure. If we do not reach you, we will leave a message.  We will attempt to reach you two times.  During this call, we will ask if you have developed any symptoms of COVID 19. If you develop any symptoms (ie: fever, flu-like symptoms, shortness of breath, cough etc.) before then, please call [phone].  If you test positive for Covid 19 in the 2 weeks post procedure, please call and report this information to [place].    If any biopsies were taken you will be contacted by phone or by letter within the next 1-3  weeks.  Please call us at [phone] if you have not heard about the biopsies in 3 weeks.    SIGNATURES/CONFIDENTIALITY: You and/or your care partner have signed paperwork which will be entered into your electronic medical record.  These signatures attest to the fact that that the information above on your After Visit Summary has been reviewed and is understood.  Full responsibility of the confidentiality of this discharge information lies with you and/or your care-partner.

## 2021-10-25 NOTE — Progress Notes (Signed)
Called to room to assist during endoscopic procedure.  Patient ID and intended procedure confirmed with present staff. Received instructions for my participation in the procedure from the performing physician.  

## 2021-10-26 ENCOUNTER — Telehealth: Payer: Self-pay

## 2021-10-26 NOTE — Telephone Encounter (Signed)
First attempt follow up call to pt, no answer. 

## 2021-10-26 NOTE — Telephone Encounter (Signed)
Attempted to reach pt. With follow-up call following endoscopic procedure 10/25/2021.  LM on pt. Voice mail to call if he has any questions or concerns.

## 2021-10-31 ENCOUNTER — Other Ambulatory Visit: Payer: Self-pay

## 2021-10-31 ENCOUNTER — Other Ambulatory Visit: Payer: Self-pay | Admitting: Gastroenterology

## 2021-10-31 DIAGNOSIS — D500 Iron deficiency anemia secondary to blood loss (chronic): Secondary | ICD-10-CM | POA: Insufficient documentation

## 2021-11-01 ENCOUNTER — Telehealth: Payer: Self-pay | Admitting: Pharmacy Technician

## 2021-11-01 NOTE — Telephone Encounter (Signed)
Dr. Loletha Carrow, Juluis Rainier note:  Auth Submission:no auth needed Payer: BCBS medicare Medication & CPT/J Code(s) submitted: Feraheme (ferumoxytol) [phone] Route of submission (phone, fax, portal): phone Auth type: Buy/Bill Units/visits requested: x2 doses Reference number: [id number] 11/01/21 '@1'$ :03 11/01/21 Approval from: 11/01/21 to 11/02/22  Patient will be scheduled as soon as possible

## 2021-11-05 ENCOUNTER — Ambulatory Visit (INDEPENDENT_AMBULATORY_CARE_PROVIDER_SITE_OTHER): Payer: Medicare Other

## 2021-11-05 VITALS — BP 137/76 | HR 76 | Temp 97.8°F | Resp 18 | Ht 71.0 in | Wt 174.8 lb

## 2021-11-05 DIAGNOSIS — D500 Iron deficiency anemia secondary to blood loss (chronic): Secondary | ICD-10-CM

## 2021-11-05 MED ORDER — SODIUM CHLORIDE 0.9 % IV SOLN
510.0000 mg | Freq: Once | INTRAVENOUS | Status: AC
  Administered 2021-11-05: 510 mg via INTRAVENOUS
  Filled 2021-11-05: qty 17

## 2021-11-05 NOTE — Progress Notes (Signed)
Diagnosis: Iron Deficiency Anemia  Provider:  Marshell Garfinkel, MD  Procedure: Infusion  IV Type: Peripheral, IV Location: L Forearm  Feraheme (Ferumoxytol), Dose: 510 mg  Infusion Start Time: 0335  Infusion Stop Time: 3317  Post Infusion IV Care: Observation period completed and Peripheral IV Discontinued  Discharge: Condition: Good, Destination: Home . AVS provided to patient.   Performed by:  Koren Shiver, RN

## 2021-11-09 DIAGNOSIS — I10 Essential (primary) hypertension: Secondary | ICD-10-CM | POA: Diagnosis not present

## 2021-11-09 DIAGNOSIS — E785 Hyperlipidemia, unspecified: Secondary | ICD-10-CM | POA: Diagnosis not present

## 2021-11-09 DIAGNOSIS — N183 Chronic kidney disease, stage 3 unspecified: Secondary | ICD-10-CM | POA: Diagnosis not present

## 2021-11-09 DIAGNOSIS — D62 Acute posthemorrhagic anemia: Secondary | ICD-10-CM | POA: Diagnosis not present

## 2021-11-09 DIAGNOSIS — R7989 Other specified abnormal findings of blood chemistry: Secondary | ICD-10-CM | POA: Diagnosis not present

## 2021-11-12 ENCOUNTER — Ambulatory Visit (INDEPENDENT_AMBULATORY_CARE_PROVIDER_SITE_OTHER): Payer: Medicare Other

## 2021-11-12 VITALS — BP 128/76 | HR 69 | Temp 98.3°F | Resp 16 | Ht 71.0 in | Wt 174.2 lb

## 2021-11-12 DIAGNOSIS — D500 Iron deficiency anemia secondary to blood loss (chronic): Secondary | ICD-10-CM | POA: Diagnosis not present

## 2021-11-12 MED ORDER — SODIUM CHLORIDE 0.9 % IV SOLN
510.0000 mg | Freq: Once | INTRAVENOUS | Status: AC
  Administered 2021-11-12: 510 mg via INTRAVENOUS
  Filled 2021-11-12: qty 17

## 2021-11-12 NOTE — Progress Notes (Signed)
Diagnosis: Iron Deficiency Anemia  Provider:  Marshell Garfinkel, MD  Procedure: Infusion  IV Type: Peripheral, IV Location: L Forearm  Feraheme (Ferumoxytol), Dose: 510 mg  Infusion Start Time: 0404  Infusion Stop Time: 5913  Post Infusion IV Care: Peripheral IV Discontinued  Discharge: Condition: Good, Destination: Home . AVS provided to patient.   Performed by:  Adelina Mings, LPN

## 2022-03-08 DIAGNOSIS — H4089 Other specified glaucoma: Secondary | ICD-10-CM | POA: Diagnosis not present

## 2022-03-08 DIAGNOSIS — Z961 Presence of intraocular lens: Secondary | ICD-10-CM | POA: Diagnosis not present

## 2022-03-08 DIAGNOSIS — E119 Type 2 diabetes mellitus without complications: Secondary | ICD-10-CM | POA: Diagnosis not present

## 2022-03-08 DIAGNOSIS — H26493 Other secondary cataract, bilateral: Secondary | ICD-10-CM | POA: Diagnosis not present

## 2022-03-18 ENCOUNTER — Other Ambulatory Visit (HOSPITAL_COMMUNITY): Payer: Self-pay | Admitting: Urology

## 2022-03-18 ENCOUNTER — Ambulatory Visit (HOSPITAL_COMMUNITY)
Admission: RE | Admit: 2022-03-18 | Discharge: 2022-03-18 | Disposition: A | Discharge status: Home / Self Care | Payer: Medicare Other | Source: Ambulatory Visit | Attending: Urology | Admitting: Urology

## 2022-03-18 DIAGNOSIS — C641 Malignant neoplasm of right kidney, except renal pelvis: Secondary | ICD-10-CM | POA: Diagnosis not present

## 2022-03-18 DIAGNOSIS — J439 Emphysema, unspecified: Secondary | ICD-10-CM | POA: Diagnosis not present

## 2022-04-02 ENCOUNTER — Other Ambulatory Visit (HOSPITAL_COMMUNITY): Payer: Self-pay

## 2022-04-15 DIAGNOSIS — Z125 Encounter for screening for malignant neoplasm of prostate: Secondary | ICD-10-CM | POA: Diagnosis not present

## 2022-04-15 DIAGNOSIS — I10 Essential (primary) hypertension: Secondary | ICD-10-CM | POA: Diagnosis not present

## 2022-04-15 DIAGNOSIS — E1129 Type 2 diabetes mellitus with other diabetic kidney complication: Secondary | ICD-10-CM | POA: Diagnosis not present

## 2022-04-15 DIAGNOSIS — E785 Hyperlipidemia, unspecified: Secondary | ICD-10-CM | POA: Diagnosis not present

## 2022-04-25 DIAGNOSIS — E1129 Type 2 diabetes mellitus with other diabetic kidney complication: Secondary | ICD-10-CM | POA: Diagnosis not present

## 2022-04-25 DIAGNOSIS — I10 Essential (primary) hypertension: Secondary | ICD-10-CM | POA: Diagnosis not present

## 2022-04-25 DIAGNOSIS — E1140 Type 2 diabetes mellitus with diabetic neuropathy, unspecified: Secondary | ICD-10-CM | POA: Diagnosis not present

## 2022-04-25 DIAGNOSIS — Z1331 Encounter for screening for depression: Secondary | ICD-10-CM | POA: Diagnosis not present

## 2022-04-25 DIAGNOSIS — Z1339 Encounter for screening examination for other mental health and behavioral disorders: Secondary | ICD-10-CM | POA: Diagnosis not present

## 2022-04-25 DIAGNOSIS — R82998 Other abnormal findings in urine: Secondary | ICD-10-CM | POA: Diagnosis not present

## 2022-04-25 DIAGNOSIS — Z0000 Encounter for general adult medical examination without abnormal findings: Secondary | ICD-10-CM | POA: Diagnosis not present

## 2022-04-25 DIAGNOSIS — G629 Polyneuropathy, unspecified: Secondary | ICD-10-CM | POA: Diagnosis not present

## 2022-04-25 DIAGNOSIS — E11319 Type 2 diabetes mellitus with unspecified diabetic retinopathy without macular edema: Secondary | ICD-10-CM | POA: Diagnosis not present

## 2022-04-26 ENCOUNTER — Other Ambulatory Visit: Payer: Self-pay | Admitting: Internal Medicine

## 2022-04-26 DIAGNOSIS — F17201 Nicotine dependence, unspecified, in remission: Secondary | ICD-10-CM

## 2022-05-27 DIAGNOSIS — R55 Syncope and collapse: Secondary | ICD-10-CM | POA: Diagnosis not present

## 2022-05-27 DIAGNOSIS — R231 Pallor: Secondary | ICD-10-CM | POA: Diagnosis not present

## 2022-05-27 DIAGNOSIS — R739 Hyperglycemia, unspecified: Secondary | ICD-10-CM | POA: Diagnosis not present

## 2022-05-28 ENCOUNTER — Ambulatory Visit
Admission: RE | Admit: 2022-05-28 | Discharge: 2022-05-28 | Disposition: A | Discharge status: Home / Self Care | Payer: BLUE CROSS/BLUE SHIELD | Source: Ambulatory Visit | Attending: Internal Medicine | Admitting: Internal Medicine

## 2022-05-28 ENCOUNTER — Encounter: Payer: Self-pay | Admitting: Gastroenterology

## 2022-05-28 DIAGNOSIS — F17201 Nicotine dependence, unspecified, in remission: Secondary | ICD-10-CM

## 2022-05-28 DIAGNOSIS — Z87891 Personal history of nicotine dependence: Secondary | ICD-10-CM | POA: Diagnosis not present

## 2022-08-08 IMAGING — CT CT ANGIO HEAD
1 of 10 series · 6 of 33 positions shown · IV contrast (omnipaque)
Comparison: Brain MRI 06/01/2021

CLINICAL DATA: Stroke follow-up

EXAM:
CT ANGIOGRAPHY HEAD AND NECK
TECHNIQUE: Multidetector CT imaging of the head and neck was performed using
the standard protocol during bolus administration of intravenous
contrast. Multiplanar CT image reconstructions and MIPs were
obtained to evaluate the vascular anatomy. Carotid stenosis
measurements (when applicable) are obtained utilizing NASCET
criteria, using the distal internal carotid diameter as the
denominator.
CONTRAST:  60mL OMNIPAQUE IOHEXOL 350 MG/ML SOLN

[Series 11: ax thin · axial · 0.34mm/px · z∈[-268,-13]mm · 6 of 357 slices shown]
[im 51/357  soft-tissue]
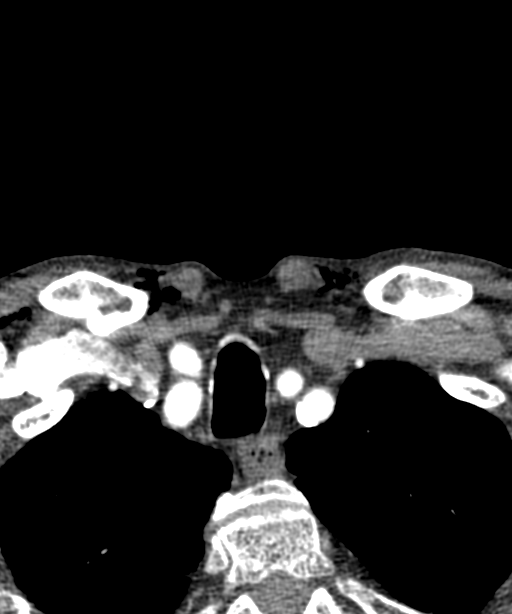
[im 102/357  bone]
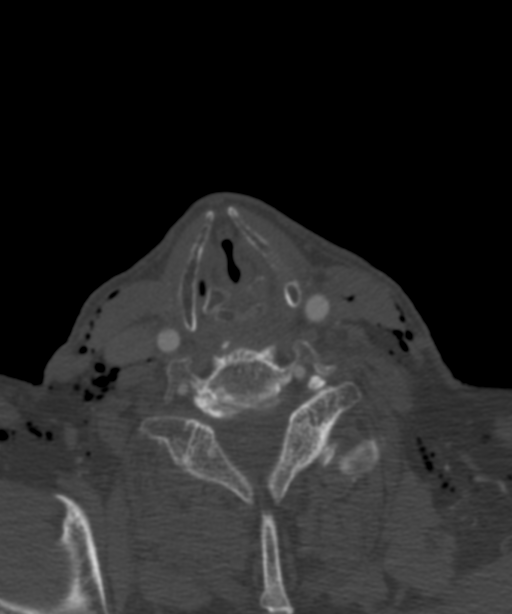
[im 153/357  soft-tissue]
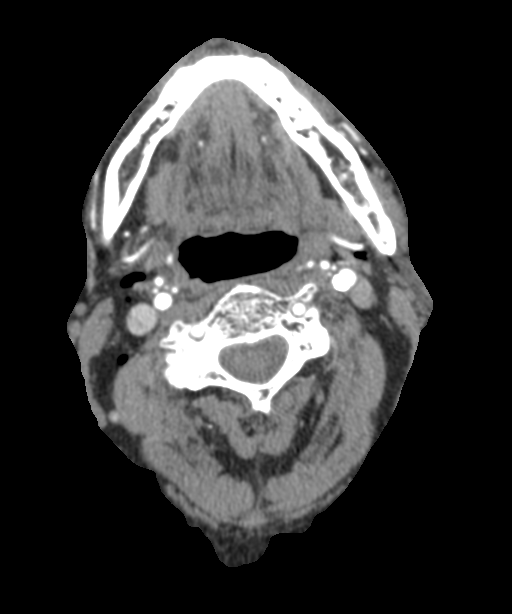
[im 204/357  bone]
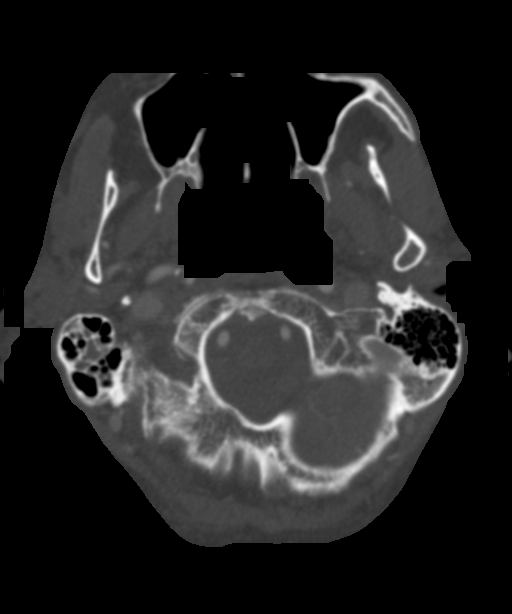
[im 255/357  soft-tissue]
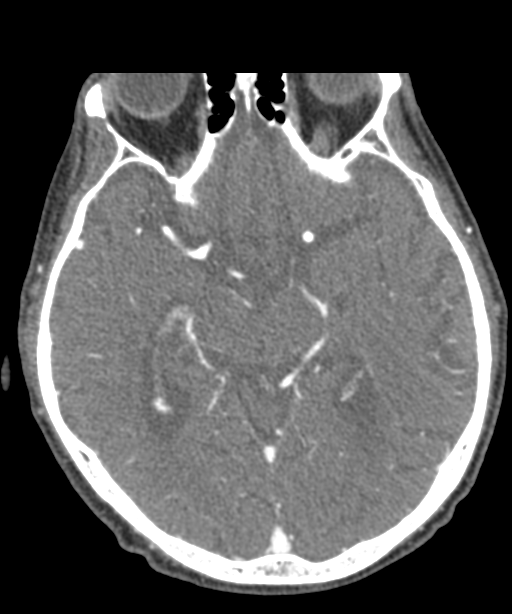
[im 306/357  bone]
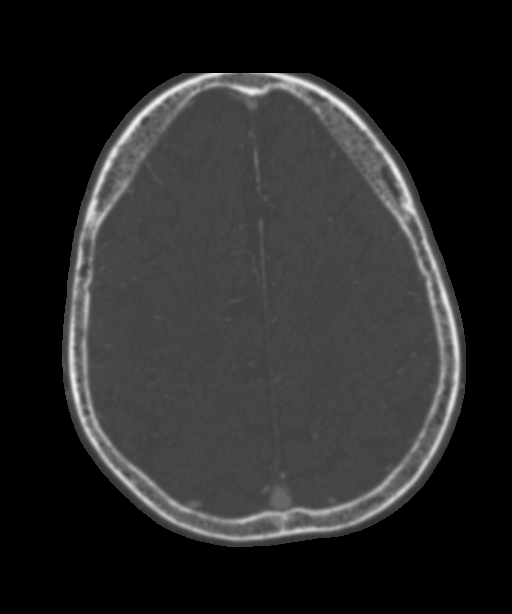

[6 of 33 positions shown; findings below may reference images not displayed]

FINDINGS: CT HEAD FINDINGS

Brain: Petechial hemorrhage in the right frontal lobe at the area
abnormality described on the earlier MRI. No mass effect. There is
generalized atrophy without lobar predilection. There is
hypoattenuation of the periventricular white matter, most commonly
indicating chronic ischemic microangiopathy.

Skull: The visualized skull base, calvarium and extracranial soft
tissues are normal.

Sinuses/Orbits: No fluid levels or advanced mucosal thickening of
the visualized paranasal sinuses. No mastoid or middle ear effusion.
The orbits are normal.

CTA NECK FINDINGS

SKELETON: There is no bony spinal canal stenosis. No lytic or
blastic lesion.

OTHER NECK: Large amount of subcutaneous gas in the visualized upper
chest and the neck.

UPPER CHEST: Large apical bullae.  Emphysema.

AORTIC ARCH:

There is calcific atherosclerosis of the aortic arch. There is no
aneurysm, dissection or hemodynamically significant stenosis of the
visualized portion of the aorta. Conventional 3 vessel aortic
branching pattern. The visualized proximal subclavian arteries are
widely patent.

RIGHT CAROTID SYSTEM: No dissection, occlusion or aneurysm. Mild
atherosclerotic calcification at the carotid bifurcation without
hemodynamically significant stenosis.

LEFT CAROTID SYSTEM: No dissection, occlusion or aneurysm. Mild
atherosclerotic calcification at the carotid bifurcation without
hemodynamically significant stenosis.

VERTEBRAL ARTERIES: Codominant configuration. Calcification at the
origin of the right vertebral artery with moderate narrowing. Right
vertebral artery is otherwise normal. The left vertebral artery is
normal along its entire course.

CTA HEAD FINDINGS

POSTERIOR CIRCULATION:

--Vertebral arteries: Normal V4 segments.

--Inferior cerebellar arteries: Normal.

--Basilar artery: Normal.

--Superior cerebellar arteries: Normal.

--Posterior cerebral arteries (PCA): Left PCA is occluded at the P1
segment. Right PCA is normal.

ANTERIOR CIRCULATION:

--Intracranial internal carotid arteries: Normal.

--Anterior cerebral arteries (ACA): Normal. Both A1 segments are
present. Patent anterior communicating artery (a-comm).

--Middle cerebral arteries (MCA): Normal.

VENOUS SINUSES: As permitted by contrast timing, patent.

ANATOMIC VARIANTS: None

Review of the MIP images confirms the above findings.
IMPRESSION: 1. Occlusion of the left posterior cerebral artery at the P1
segment. This is favored to be chronic given the lack of acute
ischemia on the earlier MRI.
2. Petechial hemorrhage in the right frontal lobe at the area
abnormality described on the earlier MRI. No mass effect.
3. Large amount of subcutaneous gas in the visualized upper chest
and neck. This may be secondary to a ruptured apical bulla.

Aortic Atherosclerosis (I4LYA-TLT.T) and Emphysema (I4LYA-N28.R).

## 2022-08-09 IMAGING — CR DG CHEST 2V
2 series · 2 of 2 positions shown · non-contrast
Comparison: CTA neck dated June 01, 2021

CLINICAL DATA: Subcutaneous emphysema in the upper chest/neck, as
seen on prior CTA neck.

EXAM:
CHEST - 2 VIEW

[w chest pa]
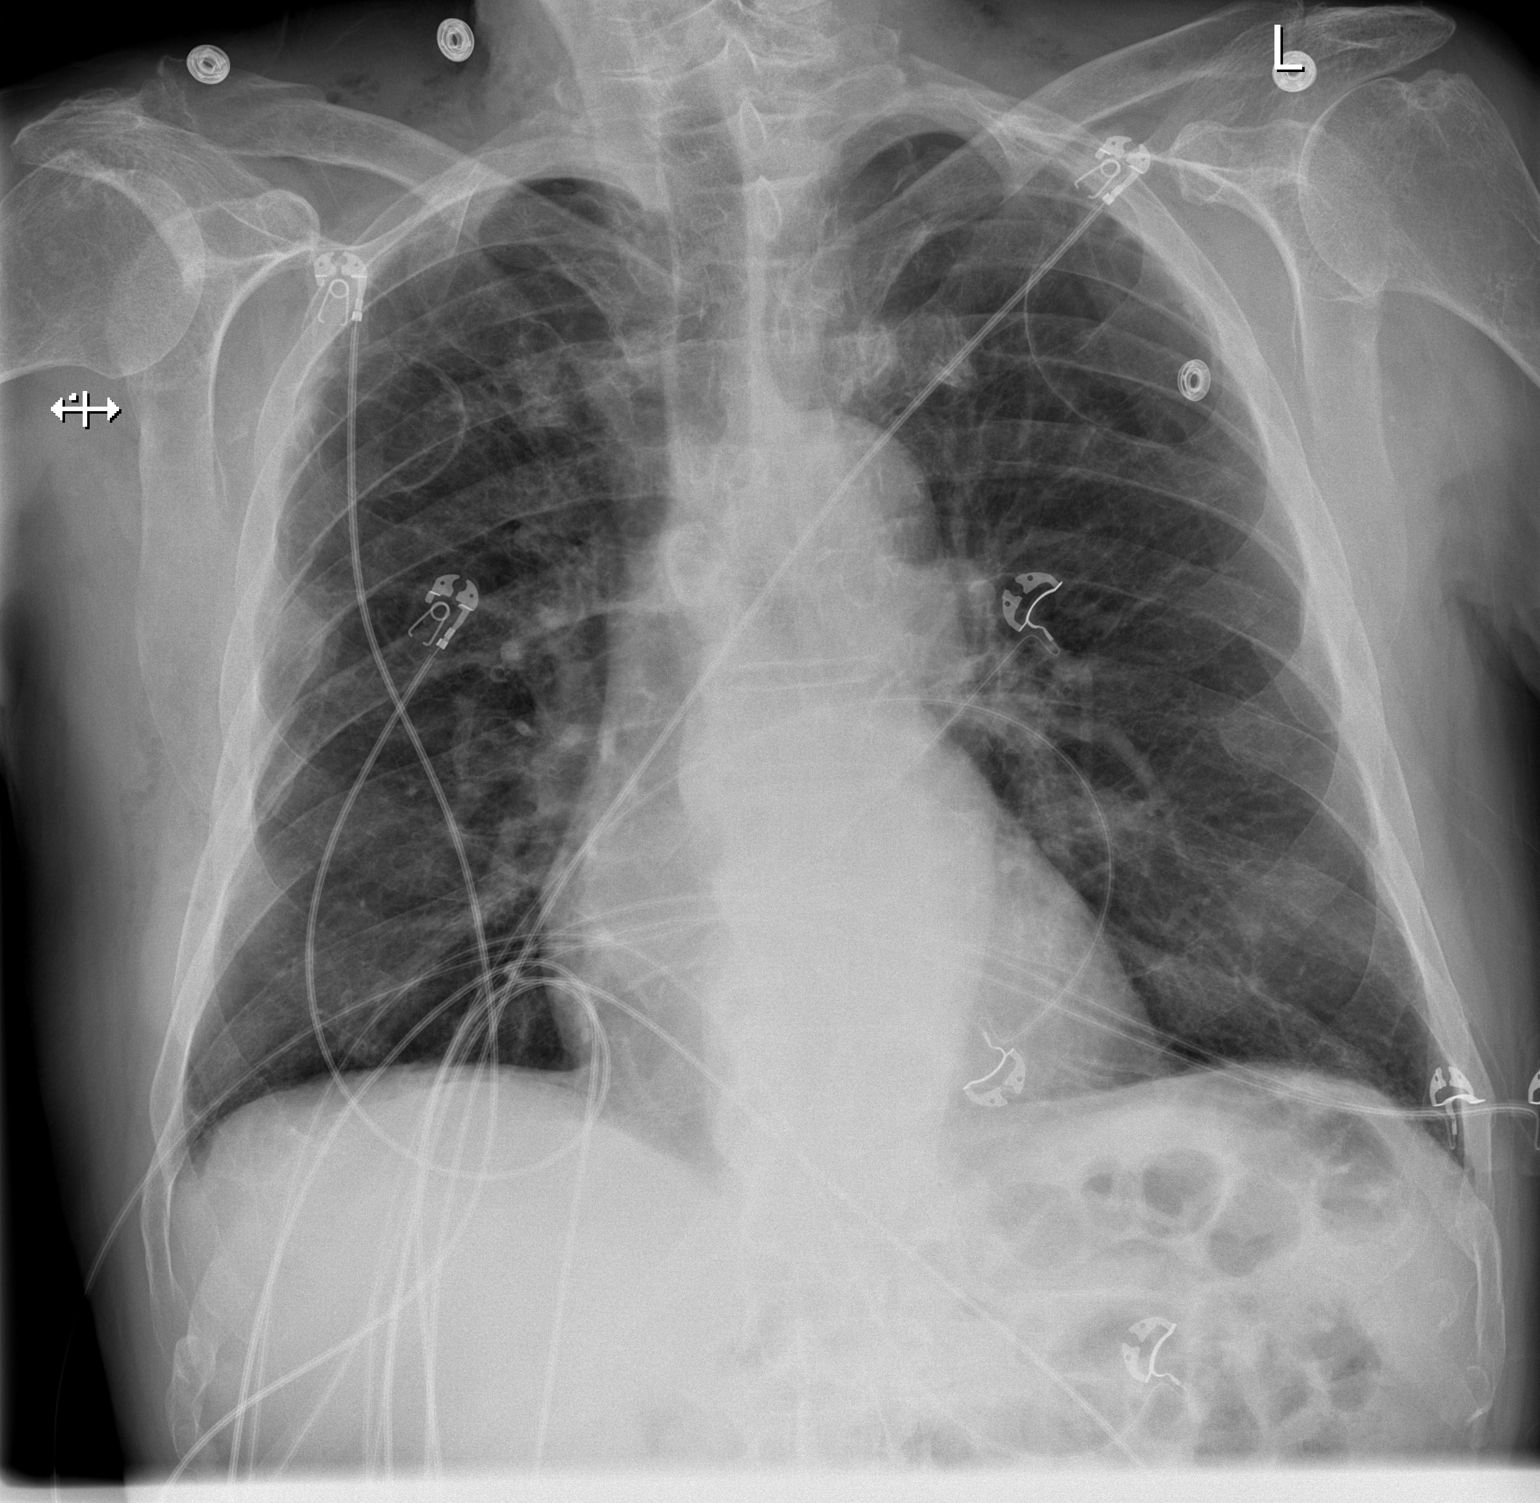

[w chest lat]
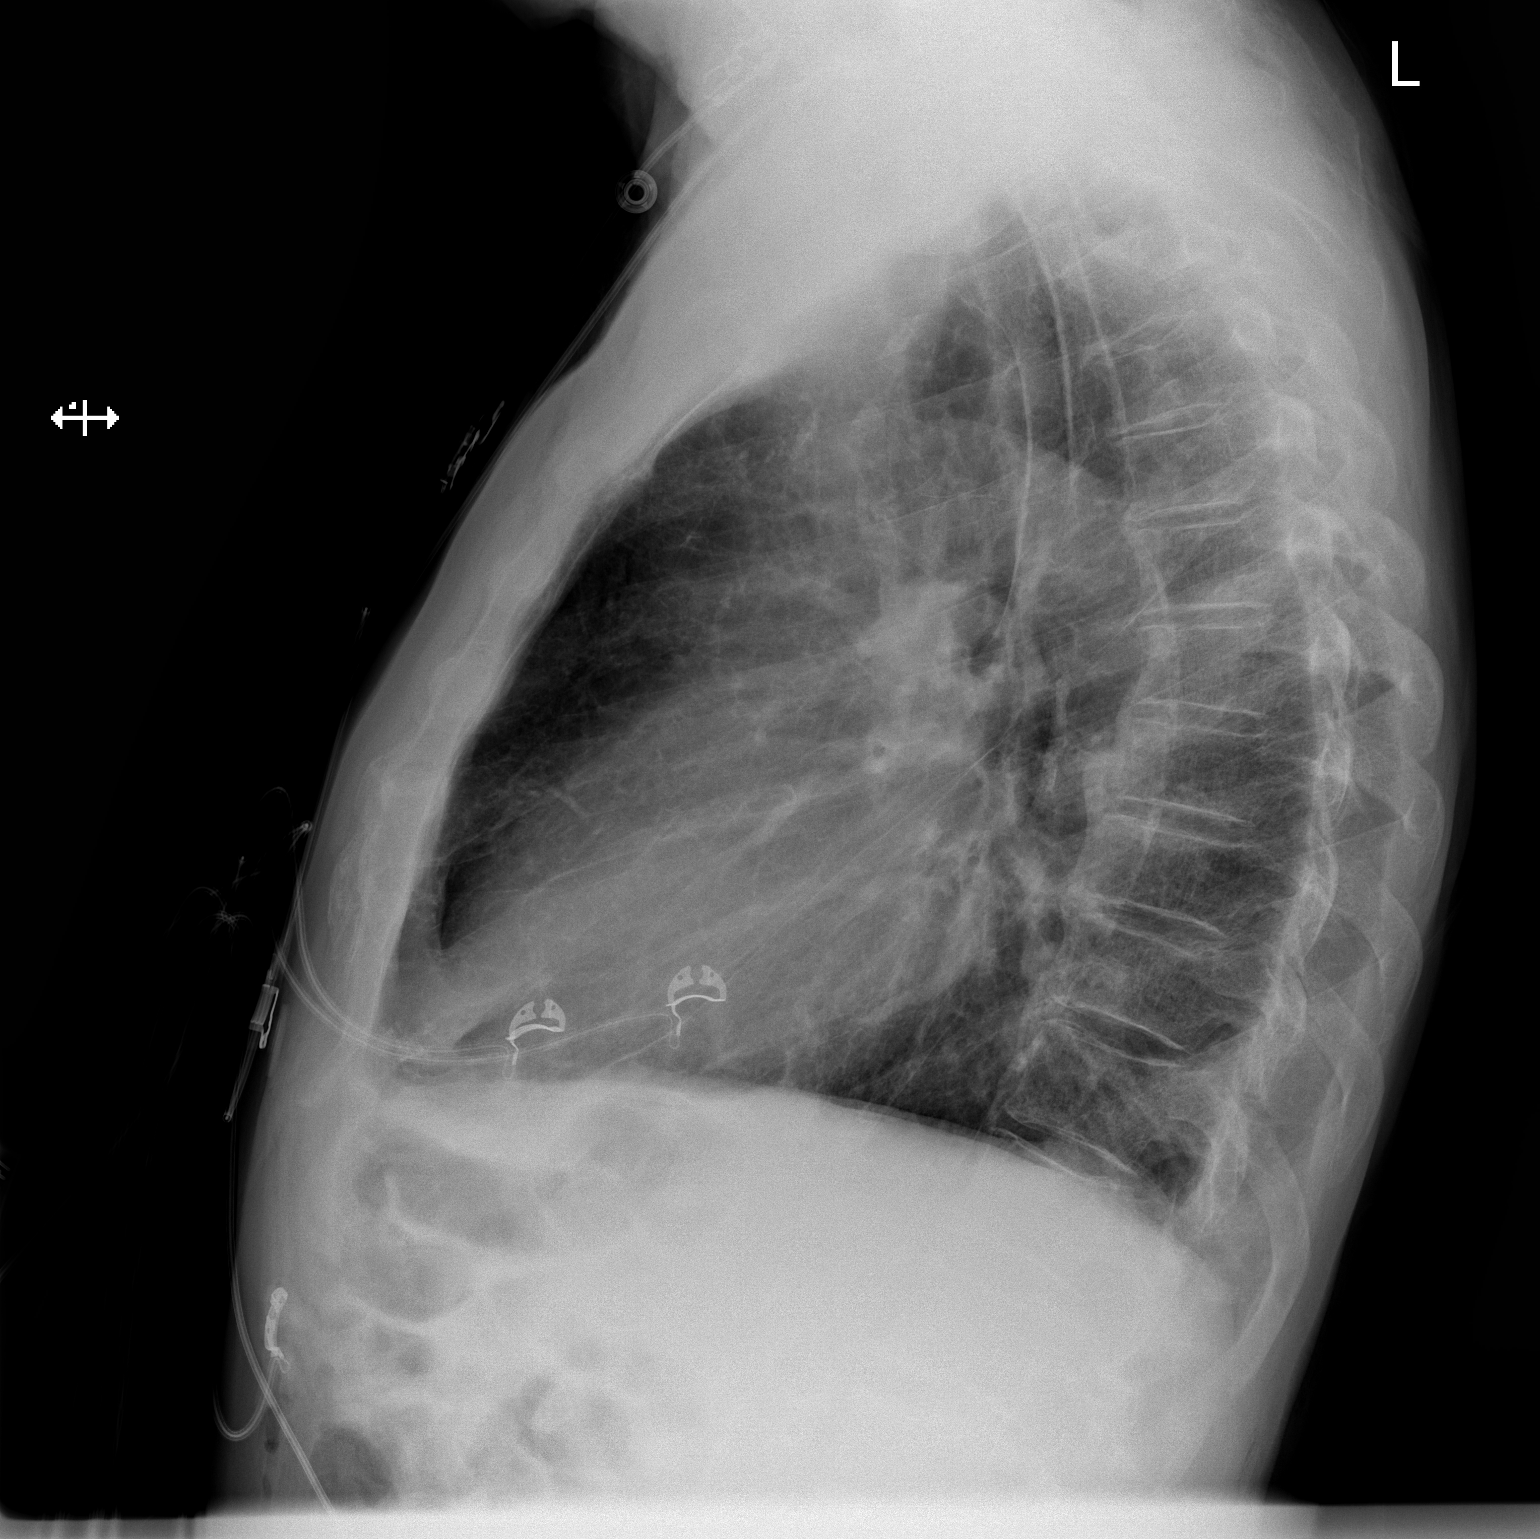

[2 of 2 positions shown; findings below may reference images not displayed]

FINDINGS: The heart size and mediastinal contours are within normal limits.
Hyperinflated lungs with emphysematous changes of the lung apices.
No focal consolidation or pleural effusion. No appreciable
pneumothorax. Subcutaneous emphysema with multiple small foci of gas
at the base of the neck and possibly in the right chest wall. The
visualized skeletal structures are unremarkable.
IMPRESSION: 1.  No acute cardiopulmonary process.

2. Emphysematous changes. No evidence of pneumothorax. No focal
consolidation.

3. Subcutaneous emphysema about base of the neck and likely in the
right chest wall.

## 2022-08-09 IMAGING — CR DG NECK SOFT TISSUE
2 series · 2 of 2 positions shown · non-contrast
Comparison: CT angiogram of the neck June 01, 2021

CLINICAL DATA: Subcutaneous gas in the visualized upper chest and
neck seen on a CT angiogram of the neck yesterday.

EXAM:
NECK SOFT TISSUES - 1+ VIEW

[w soft tissue neck lat]
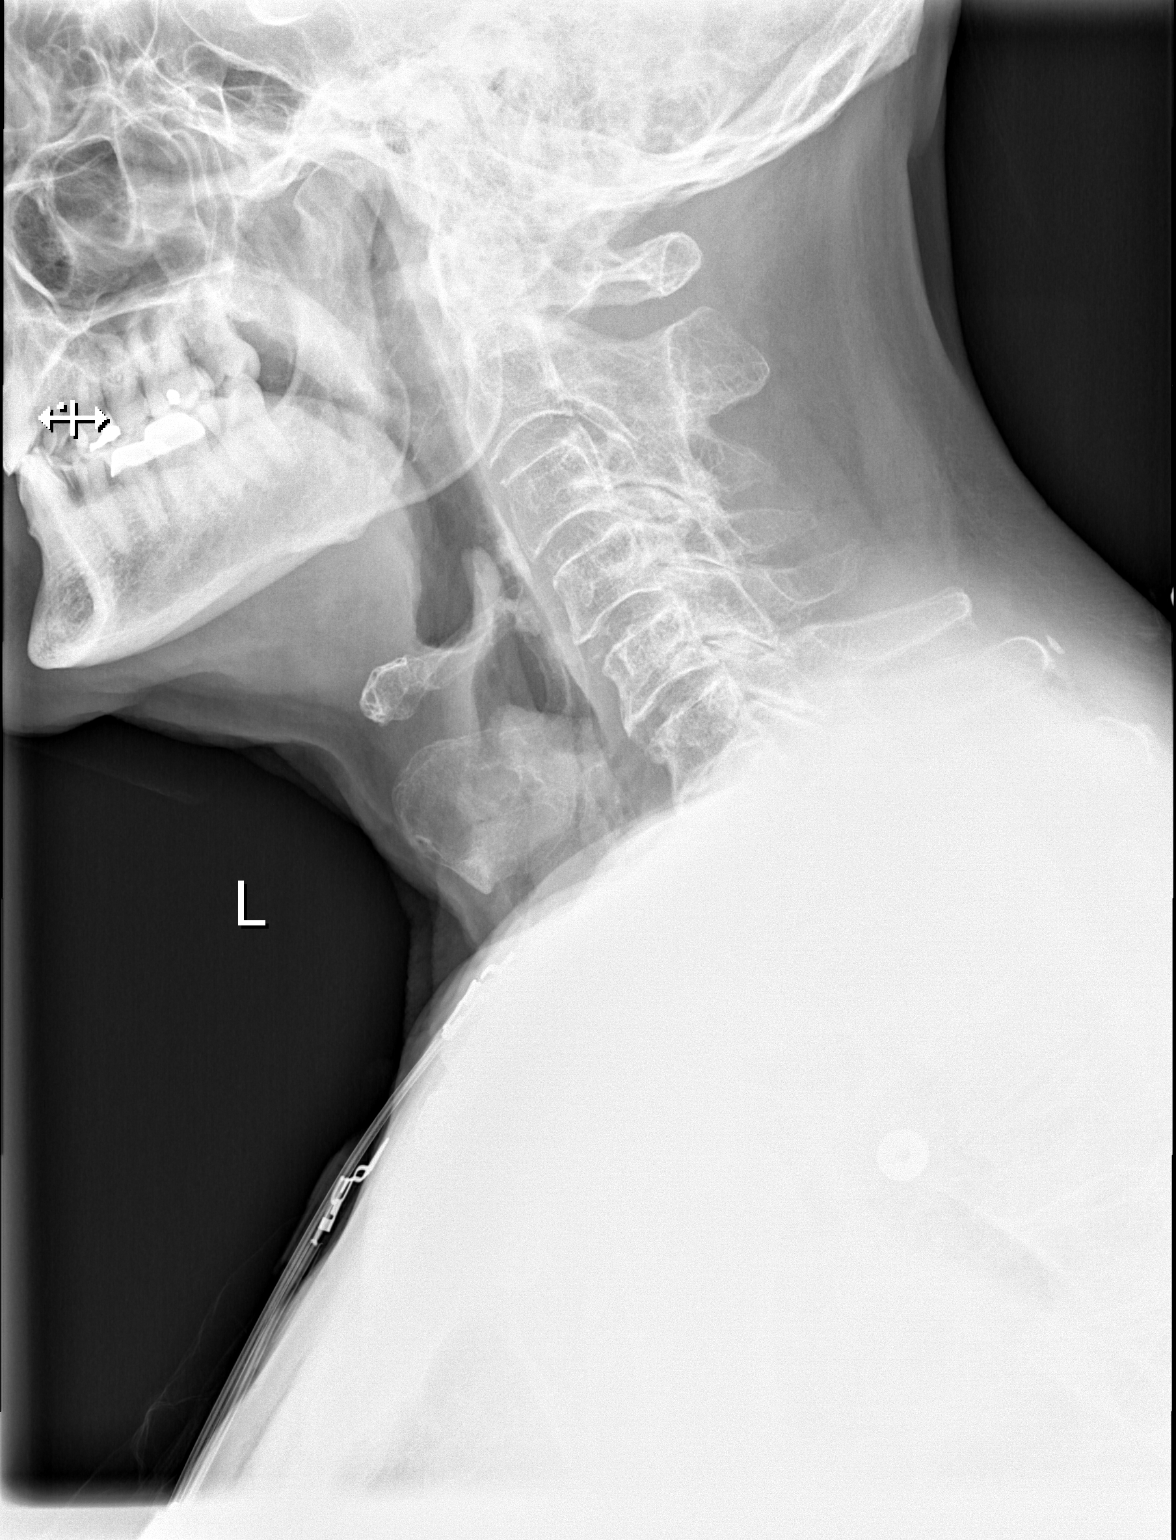

[w soft tissue neck ap]
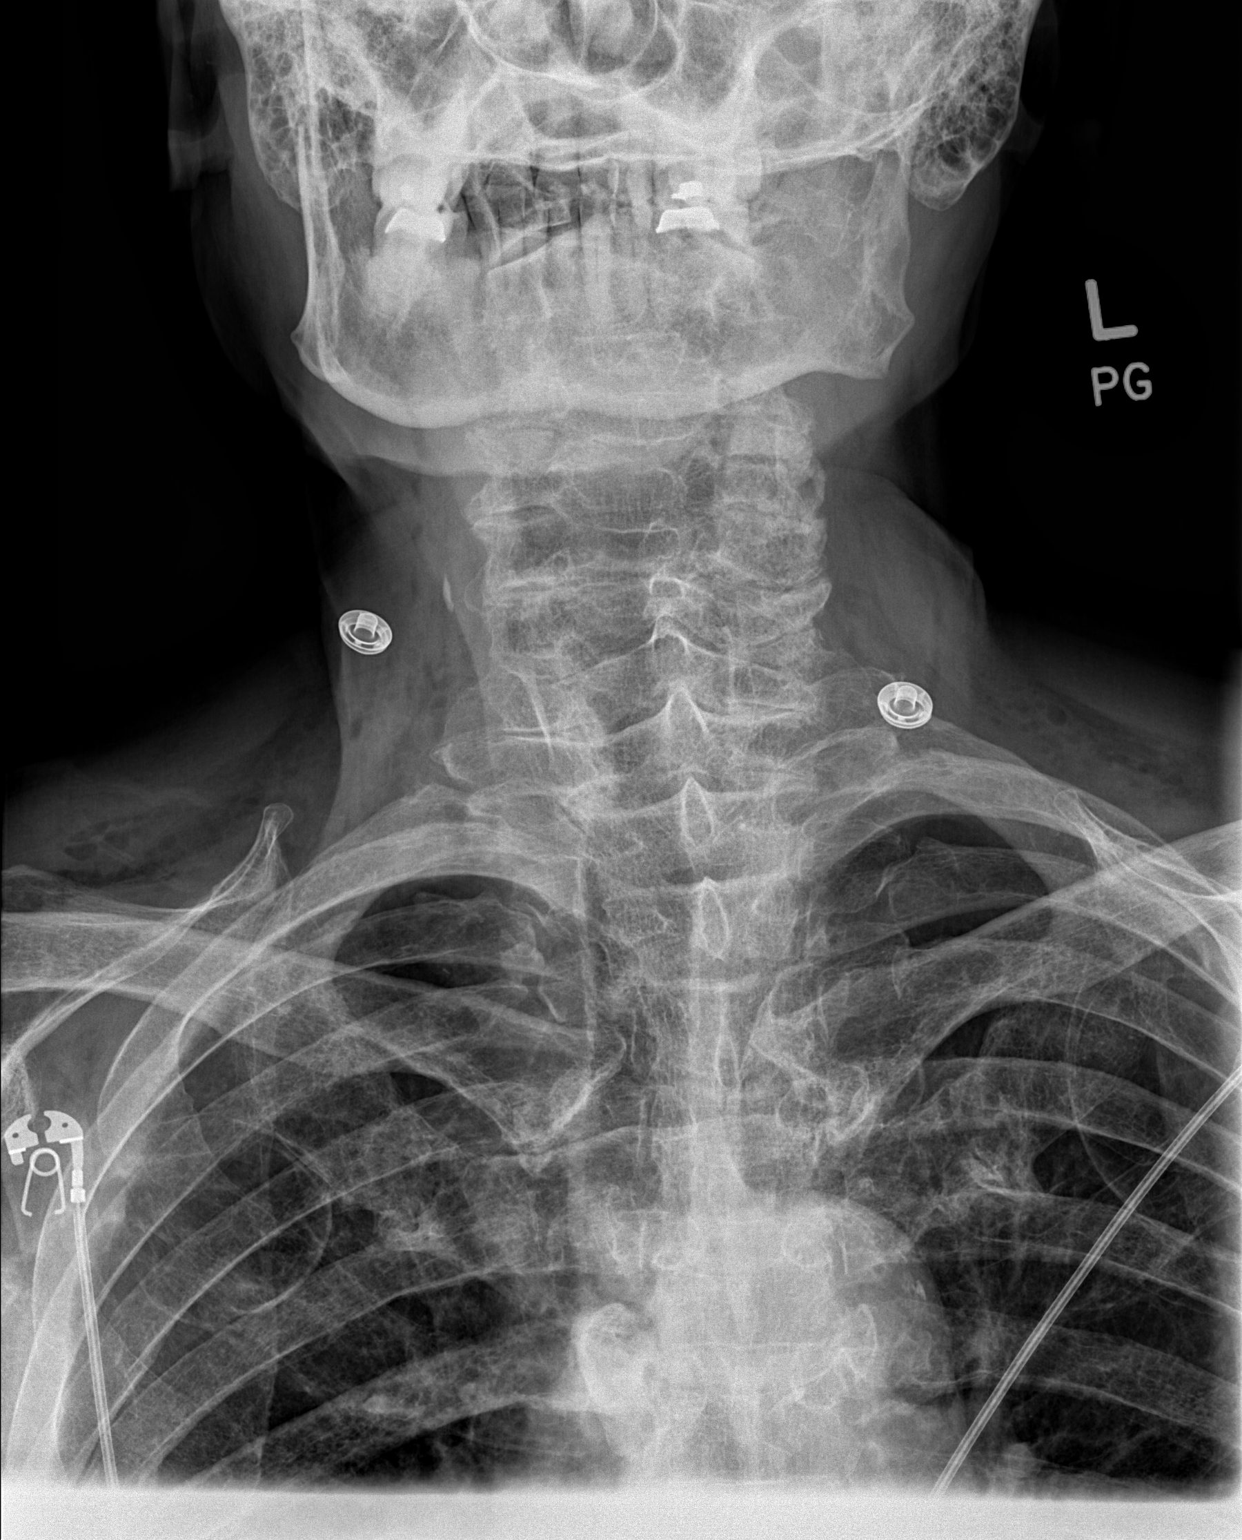

[2 of 2 positions shown; findings below may reference images not displayed]

FINDINGS: A bulla is seen in the lateral left apex. No pneumothorax
identified. A bulla is also seen in the right apex. No other
abnormalities in the upper chest.

Subcutaneous air in the bilateral neck, right greater than left,
persists. Overall, the amount of air is stable to mildly improved.
No other abnormalities identified.
IMPRESSION: Stable to mildly improved subcutaneous air in the base of the neck
bilaterally.

## 2022-08-29 DIAGNOSIS — Z23 Encounter for immunization: Secondary | ICD-10-CM | POA: Diagnosis not present

## 2022-08-29 DIAGNOSIS — M545 Low back pain, unspecified: Secondary | ICD-10-CM | POA: Diagnosis not present

## 2022-09-03 DIAGNOSIS — M9905 Segmental and somatic dysfunction of pelvic region: Secondary | ICD-10-CM | POA: Diagnosis not present

## 2022-09-03 DIAGNOSIS — M25552 Pain in left hip: Secondary | ICD-10-CM | POA: Diagnosis not present

## 2022-09-03 DIAGNOSIS — M5116 Intervertebral disc disorders with radiculopathy, lumbar region: Secondary | ICD-10-CM | POA: Diagnosis not present

## 2022-09-03 DIAGNOSIS — M9903 Segmental and somatic dysfunction of lumbar region: Secondary | ICD-10-CM | POA: Diagnosis not present

## 2022-09-05 DIAGNOSIS — M25552 Pain in left hip: Secondary | ICD-10-CM | POA: Diagnosis not present

## 2022-09-05 DIAGNOSIS — M9903 Segmental and somatic dysfunction of lumbar region: Secondary | ICD-10-CM | POA: Diagnosis not present

## 2022-09-05 DIAGNOSIS — M5116 Intervertebral disc disorders with radiculopathy, lumbar region: Secondary | ICD-10-CM | POA: Diagnosis not present

## 2022-09-05 DIAGNOSIS — M9905 Segmental and somatic dysfunction of pelvic region: Secondary | ICD-10-CM | POA: Diagnosis not present

## 2022-09-09 DIAGNOSIS — M9905 Segmental and somatic dysfunction of pelvic region: Secondary | ICD-10-CM | POA: Diagnosis not present

## 2022-09-09 DIAGNOSIS — M5116 Intervertebral disc disorders with radiculopathy, lumbar region: Secondary | ICD-10-CM | POA: Diagnosis not present

## 2022-09-09 DIAGNOSIS — M9903 Segmental and somatic dysfunction of lumbar region: Secondary | ICD-10-CM | POA: Diagnosis not present

## 2022-09-09 DIAGNOSIS — M25552 Pain in left hip: Secondary | ICD-10-CM | POA: Diagnosis not present

## 2022-09-10 DIAGNOSIS — M25552 Pain in left hip: Secondary | ICD-10-CM | POA: Diagnosis not present

## 2022-09-10 DIAGNOSIS — M9905 Segmental and somatic dysfunction of pelvic region: Secondary | ICD-10-CM | POA: Diagnosis not present

## 2022-09-10 DIAGNOSIS — M9903 Segmental and somatic dysfunction of lumbar region: Secondary | ICD-10-CM | POA: Diagnosis not present

## 2022-09-10 DIAGNOSIS — M5116 Intervertebral disc disorders with radiculopathy, lumbar region: Secondary | ICD-10-CM | POA: Diagnosis not present

## 2022-09-12 DIAGNOSIS — M25552 Pain in left hip: Secondary | ICD-10-CM | POA: Diagnosis not present

## 2022-09-12 DIAGNOSIS — M9903 Segmental and somatic dysfunction of lumbar region: Secondary | ICD-10-CM | POA: Diagnosis not present

## 2022-09-12 DIAGNOSIS — M5116 Intervertebral disc disorders with radiculopathy, lumbar region: Secondary | ICD-10-CM | POA: Diagnosis not present

## 2022-09-12 DIAGNOSIS — M9905 Segmental and somatic dysfunction of pelvic region: Secondary | ICD-10-CM | POA: Diagnosis not present

## 2022-09-16 DIAGNOSIS — M9903 Segmental and somatic dysfunction of lumbar region: Secondary | ICD-10-CM | POA: Diagnosis not present

## 2022-09-16 DIAGNOSIS — M9905 Segmental and somatic dysfunction of pelvic region: Secondary | ICD-10-CM | POA: Diagnosis not present

## 2022-09-16 DIAGNOSIS — M5116 Intervertebral disc disorders with radiculopathy, lumbar region: Secondary | ICD-10-CM | POA: Diagnosis not present

## 2022-09-16 DIAGNOSIS — M25552 Pain in left hip: Secondary | ICD-10-CM | POA: Diagnosis not present

## 2022-09-17 DIAGNOSIS — M25552 Pain in left hip: Secondary | ICD-10-CM | POA: Diagnosis not present

## 2022-09-17 DIAGNOSIS — M5116 Intervertebral disc disorders with radiculopathy, lumbar region: Secondary | ICD-10-CM | POA: Diagnosis not present

## 2022-09-17 DIAGNOSIS — M9903 Segmental and somatic dysfunction of lumbar region: Secondary | ICD-10-CM | POA: Diagnosis not present

## 2022-09-17 DIAGNOSIS — M9905 Segmental and somatic dysfunction of pelvic region: Secondary | ICD-10-CM | POA: Diagnosis not present

## 2022-09-19 DIAGNOSIS — M5116 Intervertebral disc disorders with radiculopathy, lumbar region: Secondary | ICD-10-CM | POA: Diagnosis not present

## 2022-09-19 DIAGNOSIS — M9903 Segmental and somatic dysfunction of lumbar region: Secondary | ICD-10-CM | POA: Diagnosis not present

## 2022-09-19 DIAGNOSIS — M9905 Segmental and somatic dysfunction of pelvic region: Secondary | ICD-10-CM | POA: Diagnosis not present

## 2022-09-19 DIAGNOSIS — M25552 Pain in left hip: Secondary | ICD-10-CM | POA: Diagnosis not present

## 2022-09-23 DIAGNOSIS — M9903 Segmental and somatic dysfunction of lumbar region: Secondary | ICD-10-CM | POA: Diagnosis not present

## 2022-09-23 DIAGNOSIS — M25552 Pain in left hip: Secondary | ICD-10-CM | POA: Diagnosis not present

## 2022-09-23 DIAGNOSIS — M5116 Intervertebral disc disorders with radiculopathy, lumbar region: Secondary | ICD-10-CM | POA: Diagnosis not present

## 2022-09-23 DIAGNOSIS — M9905 Segmental and somatic dysfunction of pelvic region: Secondary | ICD-10-CM | POA: Diagnosis not present

## 2022-09-24 DIAGNOSIS — M9905 Segmental and somatic dysfunction of pelvic region: Secondary | ICD-10-CM | POA: Diagnosis not present

## 2022-09-24 DIAGNOSIS — M5116 Intervertebral disc disorders with radiculopathy, lumbar region: Secondary | ICD-10-CM | POA: Diagnosis not present

## 2022-09-24 DIAGNOSIS — M9903 Segmental and somatic dysfunction of lumbar region: Secondary | ICD-10-CM | POA: Diagnosis not present

## 2022-09-24 DIAGNOSIS — M25552 Pain in left hip: Secondary | ICD-10-CM | POA: Diagnosis not present

## 2022-09-30 ENCOUNTER — Other Ambulatory Visit: Payer: Self-pay

## 2022-09-30 DIAGNOSIS — M5116 Intervertebral disc disorders with radiculopathy, lumbar region: Secondary | ICD-10-CM | POA: Diagnosis not present

## 2022-09-30 DIAGNOSIS — M9905 Segmental and somatic dysfunction of pelvic region: Secondary | ICD-10-CM | POA: Diagnosis not present

## 2022-09-30 DIAGNOSIS — M25552 Pain in left hip: Secondary | ICD-10-CM | POA: Diagnosis not present

## 2022-09-30 DIAGNOSIS — M9903 Segmental and somatic dysfunction of lumbar region: Secondary | ICD-10-CM | POA: Diagnosis not present

## 2022-09-30 MED ORDER — PANTOPRAZOLE SODIUM 40 MG PO TBEC: 40.0000 mg | DELAYED_RELEASE_TABLET | Freq: Every day | ORAL | 3 refills | Status: DC

## 2022-09-30 NOTE — Telephone Encounter (Signed)
I spoke to Angel Shelton to confirm pharmacy and how he was taking the pantoprazole currently. Dr Myrtie Neither said he is to take it forever.

## 2022-10-02 DIAGNOSIS — E1129 Type 2 diabetes mellitus with other diabetic kidney complication: Secondary | ICD-10-CM | POA: Diagnosis not present

## 2022-10-02 DIAGNOSIS — D6869 Other thrombophilia: Secondary | ICD-10-CM | POA: Diagnosis not present

## 2022-10-02 DIAGNOSIS — E785 Hyperlipidemia, unspecified: Secondary | ICD-10-CM | POA: Diagnosis not present

## 2022-10-02 DIAGNOSIS — E11319 Type 2 diabetes mellitus with unspecified diabetic retinopathy without macular edema: Secondary | ICD-10-CM | POA: Diagnosis not present

## 2022-10-02 DIAGNOSIS — I129 Hypertensive chronic kidney disease with stage 1 through stage 4 chronic kidney disease, or unspecified chronic kidney disease: Secondary | ICD-10-CM | POA: Diagnosis not present

## 2022-10-02 DIAGNOSIS — I480 Paroxysmal atrial fibrillation: Secondary | ICD-10-CM | POA: Diagnosis not present

## 2022-10-02 DIAGNOSIS — N1831 Chronic kidney disease, stage 3a: Secondary | ICD-10-CM | POA: Diagnosis not present

## 2022-10-03 ENCOUNTER — Other Ambulatory Visit: Payer: Self-pay | Admitting: Internal Medicine

## 2022-10-03 DIAGNOSIS — N179 Acute kidney failure, unspecified: Secondary | ICD-10-CM

## 2022-10-07 DIAGNOSIS — E785 Hyperlipidemia, unspecified: Secondary | ICD-10-CM | POA: Diagnosis not present

## 2022-10-09 ENCOUNTER — Encounter: Payer: Self-pay | Admitting: Gastroenterology

## 2022-10-16 ENCOUNTER — Ambulatory Visit
Admission: RE | Admit: 2022-10-16 | Discharge: 2022-10-16 | Disposition: A | Discharge status: Home / Self Care | Payer: Medicare Other | Source: Ambulatory Visit | Attending: Internal Medicine | Admitting: Internal Medicine

## 2022-10-16 DIAGNOSIS — N179 Acute kidney failure, unspecified: Secondary | ICD-10-CM

## 2022-10-25 DIAGNOSIS — E785 Hyperlipidemia, unspecified: Secondary | ICD-10-CM | POA: Diagnosis not present

## 2022-10-29 DIAGNOSIS — M9903 Segmental and somatic dysfunction of lumbar region: Secondary | ICD-10-CM | POA: Diagnosis not present

## 2022-10-29 DIAGNOSIS — M62830 Muscle spasm of back: Secondary | ICD-10-CM | POA: Diagnosis not present

## 2022-10-29 DIAGNOSIS — M9901 Segmental and somatic dysfunction of cervical region: Secondary | ICD-10-CM | POA: Diagnosis not present

## 2022-10-29 DIAGNOSIS — M545 Low back pain, unspecified: Secondary | ICD-10-CM | POA: Diagnosis not present

## 2022-10-31 DIAGNOSIS — M9901 Segmental and somatic dysfunction of cervical region: Secondary | ICD-10-CM | POA: Diagnosis not present

## 2022-10-31 DIAGNOSIS — M545 Low back pain, unspecified: Secondary | ICD-10-CM | POA: Diagnosis not present

## 2022-10-31 DIAGNOSIS — M9903 Segmental and somatic dysfunction of lumbar region: Secondary | ICD-10-CM | POA: Diagnosis not present

## 2022-10-31 DIAGNOSIS — M62830 Muscle spasm of back: Secondary | ICD-10-CM | POA: Diagnosis not present

## 2022-11-04 DIAGNOSIS — M62830 Muscle spasm of back: Secondary | ICD-10-CM | POA: Diagnosis not present

## 2022-11-04 DIAGNOSIS — M545 Low back pain, unspecified: Secondary | ICD-10-CM | POA: Diagnosis not present

## 2022-11-04 DIAGNOSIS — M9903 Segmental and somatic dysfunction of lumbar region: Secondary | ICD-10-CM | POA: Diagnosis not present

## 2022-11-04 DIAGNOSIS — M9901 Segmental and somatic dysfunction of cervical region: Secondary | ICD-10-CM | POA: Diagnosis not present

## 2022-11-06 DIAGNOSIS — M545 Low back pain, unspecified: Secondary | ICD-10-CM | POA: Diagnosis not present

## 2022-11-06 DIAGNOSIS — M62830 Muscle spasm of back: Secondary | ICD-10-CM | POA: Diagnosis not present

## 2022-11-06 DIAGNOSIS — M9903 Segmental and somatic dysfunction of lumbar region: Secondary | ICD-10-CM | POA: Diagnosis not present

## 2022-11-07 DIAGNOSIS — M9903 Segmental and somatic dysfunction of lumbar region: Secondary | ICD-10-CM | POA: Diagnosis not present

## 2022-11-07 DIAGNOSIS — M62830 Muscle spasm of back: Secondary | ICD-10-CM | POA: Diagnosis not present

## 2022-11-07 DIAGNOSIS — M545 Low back pain, unspecified: Secondary | ICD-10-CM | POA: Diagnosis not present

## 2022-11-07 DIAGNOSIS — E11319 Type 2 diabetes mellitus with unspecified diabetic retinopathy without macular edema: Secondary | ICD-10-CM | POA: Diagnosis not present

## 2022-11-11 DIAGNOSIS — M62830 Muscle spasm of back: Secondary | ICD-10-CM | POA: Diagnosis not present

## 2022-11-11 DIAGNOSIS — M545 Low back pain, unspecified: Secondary | ICD-10-CM | POA: Diagnosis not present

## 2022-11-11 DIAGNOSIS — M9903 Segmental and somatic dysfunction of lumbar region: Secondary | ICD-10-CM | POA: Diagnosis not present

## 2022-11-13 DIAGNOSIS — M9903 Segmental and somatic dysfunction of lumbar region: Secondary | ICD-10-CM | POA: Diagnosis not present

## 2022-11-13 DIAGNOSIS — M545 Low back pain, unspecified: Secondary | ICD-10-CM | POA: Diagnosis not present

## 2022-11-13 DIAGNOSIS — M62830 Muscle spasm of back: Secondary | ICD-10-CM | POA: Diagnosis not present

## 2022-11-14 DIAGNOSIS — M545 Low back pain, unspecified: Secondary | ICD-10-CM | POA: Diagnosis not present

## 2022-11-14 DIAGNOSIS — M9903 Segmental and somatic dysfunction of lumbar region: Secondary | ICD-10-CM | POA: Diagnosis not present

## 2022-11-14 DIAGNOSIS — M62830 Muscle spasm of back: Secondary | ICD-10-CM | POA: Diagnosis not present

## 2022-11-15 DIAGNOSIS — Z7982 Long term (current) use of aspirin: Secondary | ICD-10-CM | POA: Diagnosis not present

## 2022-11-15 DIAGNOSIS — I10 Essential (primary) hypertension: Secondary | ICD-10-CM | POA: Diagnosis not present

## 2022-11-15 DIAGNOSIS — I951 Orthostatic hypotension: Secondary | ICD-10-CM | POA: Diagnosis not present

## 2022-11-15 DIAGNOSIS — E785 Hyperlipidemia, unspecified: Secondary | ICD-10-CM | POA: Diagnosis not present

## 2022-11-15 DIAGNOSIS — R32 Unspecified urinary incontinence: Secondary | ICD-10-CM | POA: Diagnosis not present

## 2022-11-15 DIAGNOSIS — Z85528 Personal history of other malignant neoplasm of kidney: Secondary | ICD-10-CM | POA: Diagnosis not present

## 2022-11-15 DIAGNOSIS — K219 Gastro-esophageal reflux disease without esophagitis: Secondary | ICD-10-CM | POA: Diagnosis not present

## 2022-11-18 DIAGNOSIS — M545 Low back pain, unspecified: Secondary | ICD-10-CM | POA: Diagnosis not present

## 2022-11-18 DIAGNOSIS — M62830 Muscle spasm of back: Secondary | ICD-10-CM | POA: Diagnosis not present

## 2022-11-18 DIAGNOSIS — M9903 Segmental and somatic dysfunction of lumbar region: Secondary | ICD-10-CM | POA: Diagnosis not present

## 2022-11-20 DIAGNOSIS — M9903 Segmental and somatic dysfunction of lumbar region: Secondary | ICD-10-CM | POA: Diagnosis not present

## 2022-11-20 DIAGNOSIS — M545 Low back pain, unspecified: Secondary | ICD-10-CM | POA: Diagnosis not present

## 2022-11-20 DIAGNOSIS — M62830 Muscle spasm of back: Secondary | ICD-10-CM | POA: Diagnosis not present

## 2022-12-26 DIAGNOSIS — E119 Type 2 diabetes mellitus without complications: Secondary | ICD-10-CM | POA: Diagnosis not present

## 2023-01-02 DIAGNOSIS — E1129 Type 2 diabetes mellitus with other diabetic kidney complication: Secondary | ICD-10-CM | POA: Diagnosis not present

## 2023-01-02 DIAGNOSIS — I10 Essential (primary) hypertension: Secondary | ICD-10-CM | POA: Diagnosis not present

## 2023-01-20 DIAGNOSIS — N2581 Secondary hyperparathyroidism of renal origin: Secondary | ICD-10-CM | POA: Diagnosis not present

## 2023-01-20 DIAGNOSIS — D631 Anemia in chronic kidney disease: Secondary | ICD-10-CM | POA: Diagnosis not present

## 2023-01-20 DIAGNOSIS — I129 Hypertensive chronic kidney disease with stage 1 through stage 4 chronic kidney disease, or unspecified chronic kidney disease: Secondary | ICD-10-CM | POA: Diagnosis not present

## 2023-01-20 DIAGNOSIS — N1831 Chronic kidney disease, stage 3a: Secondary | ICD-10-CM | POA: Diagnosis not present

## 2023-01-22 DIAGNOSIS — E1129 Type 2 diabetes mellitus with other diabetic kidney complication: Secondary | ICD-10-CM | POA: Diagnosis not present

## 2023-01-22 DIAGNOSIS — Z23 Encounter for immunization: Secondary | ICD-10-CM | POA: Diagnosis not present

## 2023-01-22 DIAGNOSIS — I129 Hypertensive chronic kidney disease with stage 1 through stage 4 chronic kidney disease, or unspecified chronic kidney disease: Secondary | ICD-10-CM | POA: Diagnosis not present

## 2023-01-26 DIAGNOSIS — E119 Type 2 diabetes mellitus without complications: Secondary | ICD-10-CM | POA: Diagnosis not present

## 2023-02-04 DIAGNOSIS — K080 Exfoliation of teeth due to systemic causes: Secondary | ICD-10-CM | POA: Diagnosis not present

## 2023-03-10 DIAGNOSIS — H4089 Other specified glaucoma: Secondary | ICD-10-CM | POA: Diagnosis not present

## 2023-03-10 DIAGNOSIS — H524 Presbyopia: Secondary | ICD-10-CM | POA: Diagnosis not present

## 2023-03-10 DIAGNOSIS — E119 Type 2 diabetes mellitus without complications: Secondary | ICD-10-CM | POA: Diagnosis not present

## 2023-03-10 DIAGNOSIS — H01001 Unspecified blepharitis right upper eyelid: Secondary | ICD-10-CM | POA: Diagnosis not present

## 2023-03-10 DIAGNOSIS — H01004 Unspecified blepharitis left upper eyelid: Secondary | ICD-10-CM | POA: Diagnosis not present

## 2023-04-01 DIAGNOSIS — E1129 Type 2 diabetes mellitus with other diabetic kidney complication: Secondary | ICD-10-CM | POA: Diagnosis not present

## 2023-04-01 DIAGNOSIS — I10 Essential (primary) hypertension: Secondary | ICD-10-CM | POA: Diagnosis not present

## 2023-04-14 ENCOUNTER — Emergency Department (HOSPITAL_COMMUNITY): Payer: Medicare Other

## 2023-04-14 ENCOUNTER — Inpatient Hospital Stay (HOSPITAL_COMMUNITY)
Admission: EM | Admit: 2023-04-14 | Discharge: 2023-04-23 | DRG: 871 | Disposition: A | Discharge status: Skilled Nursing Facility | Payer: Medicare Other | Attending: Family Medicine | Admitting: Family Medicine

## 2023-04-14 ENCOUNTER — Other Ambulatory Visit: Payer: Self-pay

## 2023-04-14 DIAGNOSIS — R935 Abnormal findings on diagnostic imaging of other abdominal regions, including retroperitoneum: Secondary | ICD-10-CM

## 2023-04-14 DIAGNOSIS — E876 Hypokalemia: Secondary | ICD-10-CM | POA: Diagnosis not present

## 2023-04-14 DIAGNOSIS — R6520 Severe sepsis without septic shock: Secondary | ICD-10-CM | POA: Diagnosis not present

## 2023-04-14 DIAGNOSIS — A419 Sepsis, unspecified organism: Principal | ICD-10-CM

## 2023-04-14 DIAGNOSIS — N401 Enlarged prostate with lower urinary tract symptoms: Secondary | ICD-10-CM | POA: Diagnosis not present

## 2023-04-14 DIAGNOSIS — F1010 Alcohol abuse, uncomplicated: Secondary | ICD-10-CM | POA: Diagnosis present

## 2023-04-14 DIAGNOSIS — J439 Emphysema, unspecified: Secondary | ICD-10-CM | POA: Diagnosis not present

## 2023-04-14 DIAGNOSIS — K219 Gastro-esophageal reflux disease without esophagitis: Secondary | ICD-10-CM | POA: Diagnosis not present

## 2023-04-14 DIAGNOSIS — R1031 Right lower quadrant pain: Secondary | ICD-10-CM | POA: Diagnosis not present

## 2023-04-14 DIAGNOSIS — Z794 Long term (current) use of insulin: Secondary | ICD-10-CM | POA: Diagnosis not present

## 2023-04-14 DIAGNOSIS — Z79899 Other long term (current) drug therapy: Secondary | ICD-10-CM

## 2023-04-14 DIAGNOSIS — Z751 Person awaiting admission to adequate facility elsewhere: Secondary | ICD-10-CM

## 2023-04-14 DIAGNOSIS — N1831 Chronic kidney disease, stage 3a: Secondary | ICD-10-CM

## 2023-04-14 DIAGNOSIS — K5732 Diverticulitis of large intestine without perforation or abscess without bleeding: Secondary | ICD-10-CM | POA: Diagnosis not present

## 2023-04-14 DIAGNOSIS — K5730 Diverticulosis of large intestine without perforation or abscess without bleeding: Secondary | ICD-10-CM | POA: Diagnosis not present

## 2023-04-14 DIAGNOSIS — Z7901 Long term (current) use of anticoagulants: Secondary | ICD-10-CM

## 2023-04-14 DIAGNOSIS — E860 Dehydration: Secondary | ICD-10-CM | POA: Diagnosis not present

## 2023-04-14 DIAGNOSIS — K828 Other specified diseases of gallbladder: Secondary | ICD-10-CM | POA: Diagnosis not present

## 2023-04-14 DIAGNOSIS — E871 Hypo-osmolality and hyponatremia: Secondary | ICD-10-CM | POA: Diagnosis not present

## 2023-04-14 DIAGNOSIS — E1165 Type 2 diabetes mellitus with hyperglycemia: Secondary | ICD-10-CM

## 2023-04-14 DIAGNOSIS — R2689 Other abnormalities of gait and mobility: Secondary | ICD-10-CM | POA: Diagnosis not present

## 2023-04-14 DIAGNOSIS — I10 Essential (primary) hypertension: Secondary | ICD-10-CM | POA: Diagnosis not present

## 2023-04-14 DIAGNOSIS — K8000 Calculus of gallbladder with acute cholecystitis without obstruction: Secondary | ICD-10-CM | POA: Diagnosis present

## 2023-04-14 DIAGNOSIS — E1169 Type 2 diabetes mellitus with other specified complication: Secondary | ICD-10-CM | POA: Diagnosis not present

## 2023-04-14 DIAGNOSIS — I959 Hypotension, unspecified: Secondary | ICD-10-CM | POA: Diagnosis present

## 2023-04-14 DIAGNOSIS — R1084 Generalized abdominal pain: Secondary | ICD-10-CM | POA: Diagnosis not present

## 2023-04-14 DIAGNOSIS — R531 Weakness: Secondary | ICD-10-CM | POA: Diagnosis not present

## 2023-04-14 DIAGNOSIS — W19XXXA Unspecified fall, initial encounter: Secondary | ICD-10-CM | POA: Diagnosis not present

## 2023-04-14 DIAGNOSIS — E785 Hyperlipidemia, unspecified: Secondary | ICD-10-CM | POA: Diagnosis not present

## 2023-04-14 DIAGNOSIS — Z7984 Long term (current) use of oral hypoglycemic drugs: Secondary | ICD-10-CM

## 2023-04-14 DIAGNOSIS — R109 Unspecified abdominal pain: Secondary | ICD-10-CM | POA: Diagnosis not present

## 2023-04-14 DIAGNOSIS — E119 Type 2 diabetes mellitus without complications: Secondary | ICD-10-CM

## 2023-04-14 DIAGNOSIS — D696 Thrombocytopenia, unspecified: Secondary | ICD-10-CM | POA: Diagnosis not present

## 2023-04-14 DIAGNOSIS — R338 Other retention of urine: Secondary | ICD-10-CM | POA: Diagnosis not present

## 2023-04-14 DIAGNOSIS — Z87891 Personal history of nicotine dependence: Secondary | ICD-10-CM

## 2023-04-14 DIAGNOSIS — I2510 Atherosclerotic heart disease of native coronary artery without angina pectoris: Secondary | ICD-10-CM | POA: Diagnosis not present

## 2023-04-14 DIAGNOSIS — E861 Hypovolemia: Secondary | ICD-10-CM | POA: Diagnosis not present

## 2023-04-14 DIAGNOSIS — Z905 Acquired absence of kidney: Secondary | ICD-10-CM

## 2023-04-14 DIAGNOSIS — I700 Atherosclerosis of aorta: Secondary | ICD-10-CM | POA: Diagnosis not present

## 2023-04-14 DIAGNOSIS — Z8673 Personal history of transient ischemic attack (TIA), and cerebral infarction without residual deficits: Secondary | ICD-10-CM | POA: Diagnosis not present

## 2023-04-14 DIAGNOSIS — Z8249 Family history of ischemic heart disease and other diseases of the circulatory system: Secondary | ICD-10-CM

## 2023-04-14 DIAGNOSIS — Z823 Family history of stroke: Secondary | ICD-10-CM

## 2023-04-14 DIAGNOSIS — E1122 Type 2 diabetes mellitus with diabetic chronic kidney disease: Secondary | ICD-10-CM | POA: Diagnosis present

## 2023-04-14 DIAGNOSIS — N200 Calculus of kidney: Secondary | ICD-10-CM | POA: Diagnosis present

## 2023-04-14 DIAGNOSIS — E872 Acidosis, unspecified: Secondary | ICD-10-CM | POA: Diagnosis not present

## 2023-04-14 DIAGNOSIS — M6281 Muscle weakness (generalized): Secondary | ICD-10-CM | POA: Diagnosis not present

## 2023-04-14 DIAGNOSIS — N170 Acute kidney failure with tubular necrosis: Secondary | ICD-10-CM | POA: Diagnosis present

## 2023-04-14 DIAGNOSIS — I9589 Other hypotension: Secondary | ICD-10-CM | POA: Diagnosis present

## 2023-04-14 DIAGNOSIS — K915 Postcholecystectomy syndrome: Secondary | ICD-10-CM | POA: Diagnosis not present

## 2023-04-14 DIAGNOSIS — I4821 Permanent atrial fibrillation: Secondary | ICD-10-CM | POA: Diagnosis present

## 2023-04-14 DIAGNOSIS — K810 Acute cholecystitis: Secondary | ICD-10-CM | POA: Diagnosis not present

## 2023-04-14 DIAGNOSIS — Z7401 Bed confinement status: Secondary | ICD-10-CM | POA: Diagnosis not present

## 2023-04-14 DIAGNOSIS — K839 Disease of biliary tract, unspecified: Secondary | ICD-10-CM | POA: Diagnosis not present

## 2023-04-14 DIAGNOSIS — S2249XA Multiple fractures of ribs, unspecified side, initial encounter for closed fracture: Secondary | ICD-10-CM | POA: Diagnosis not present

## 2023-04-14 DIAGNOSIS — N179 Acute kidney failure, unspecified: Secondary | ICD-10-CM | POA: Diagnosis present

## 2023-04-14 DIAGNOSIS — M6259 Muscle wasting and atrophy, not elsewhere classified, multiple sites: Secondary | ICD-10-CM | POA: Diagnosis not present

## 2023-04-14 DIAGNOSIS — K838 Other specified diseases of biliary tract: Secondary | ICD-10-CM | POA: Diagnosis not present

## 2023-04-14 HISTORY — DX: Encounter for other specified special examinations: Z01.89

## 2023-04-14 LAB — COMPREHENSIVE METABOLIC PANEL
ALT: 20 U/L (ref 0–44)
AST: 35 U/L (ref 15–41)
Albumin: 2.2 g/dL — ABNORMAL LOW (ref 3.5–5.0)
Alkaline Phosphatase: 39 U/L (ref 38–126)
Anion gap: 13 (ref 5–15)
BUN: 31 mg/dL — ABNORMAL HIGH (ref 8–23)
CO2: 18 mmol/L — ABNORMAL LOW (ref 22–32)
Calcium: 8.4 mg/dL — ABNORMAL LOW (ref 8.9–10.3)
Chloride: 97 mmol/L — ABNORMAL LOW (ref 98–111)
Creatinine, Ser: 2.04 mg/dL — ABNORMAL HIGH (ref 0.61–1.24)
GFR, Estimated: 35 mL/min — ABNORMAL LOW (ref >60)
Glucose, Bld: 70 mg/dL (ref 70–99)
Potassium: 3.5 mmol/L (ref 3.5–5.1)
Sodium: 128 mmol/L — ABNORMAL LOW (ref 135–145)
Total Bilirubin: 2.9 mg/dL — ABNORMAL HIGH (ref <1.2)
Total Protein: 4.7 g/dL — ABNORMAL LOW (ref 6.5–8.1)

## 2023-04-14 LAB — CBC
HCT: 40 % (ref 39.0–52.0)
Hemoglobin: 13.5 g/dL (ref 13.0–17.0)
MCH: 31.8 pg (ref 26.0–34.0)
MCHC: 33.8 g/dL (ref 30.0–36.0)
MCV: 94.1 fL (ref 80.0–100.0)
Platelets: 115 10*3/uL — ABNORMAL LOW (ref 150–400)
RBC: 4.25 MIL/uL (ref 4.22–5.81)
RDW: 14.4 % (ref 11.5–15.5)
WBC: 14.7 10*3/uL — ABNORMAL HIGH (ref 4.0–10.5)
nRBC: 0 % (ref 0.0–0.2)

## 2023-04-14 LAB — I-STAT CG4 LACTIC ACID, ED
Lactic Acid, Venous: 2.2 mmol/L (ref 0.5–1.9)
Lactic Acid, Venous: 1.6 mmol/L (ref 0.5–1.9)

## 2023-04-14 LAB — LIPASE, BLOOD: Lipase: 51 U/L (ref 11–51)

## 2023-04-14 MED ORDER — INSULIN ASPART 100 UNIT/ML IJ SOLN
0.0000 [IU] | Freq: Three times a day (TID) | INTRAMUSCULAR | Status: DC
  Administered 2023-04-15: 3 [IU] via SUBCUTANEOUS
  Administered 2023-04-16: 7 [IU] via SUBCUTANEOUS
  Administered 2023-04-17 – 2023-04-18 (×3): 1 [IU] via SUBCUTANEOUS
  Administered 2023-04-18 – 2023-04-19 (×2): 3 [IU] via SUBCUTANEOUS
  Administered 2023-04-19: 5 [IU] via SUBCUTANEOUS
  Administered 2023-04-19 – 2023-04-20 (×4): 3 [IU] via SUBCUTANEOUS
  Administered 2023-04-21 (×3): 2 [IU] via SUBCUTANEOUS
  Administered 2023-04-22: 3 [IU] via SUBCUTANEOUS
  Administered 2023-04-22: 5 [IU] via SUBCUTANEOUS
  Administered 2023-04-22 – 2023-04-23 (×2): 3 [IU] via SUBCUTANEOUS
  Administered 2023-04-23: 2 [IU] via SUBCUTANEOUS
  Administered 2023-04-23: 3 [IU] via SUBCUTANEOUS

## 2023-04-14 MED ORDER — FENOFIBRATE 160 MG PO TABS
160.0000 mg | ORAL_TABLET | Freq: Every day | ORAL | Status: DC
  Administered 2023-04-16 – 2023-04-23 (×8): 160 mg via ORAL
  Filled 2023-04-14 (×9): qty 1

## 2023-04-14 MED ORDER — SODIUM CHLORIDE 0.9 % IV BOLUS
1000.0000 mL | Freq: Once | INTRAVENOUS | Status: AC
  Administered 2023-04-14: 1000 mL via INTRAVENOUS

## 2023-04-14 MED ORDER — GABAPENTIN 300 MG PO CAPS: 600.0000 mg | ORAL_CAPSULE | Freq: Every day | ORAL | Status: DC

## 2023-04-14 MED ORDER — LISINOPRIL-HYDROCHLOROTHIAZIDE 20-12.5 MG PO TABS: 1.0000 | ORAL_TABLET | Freq: Every day | ORAL | Status: DC

## 2023-04-14 MED ORDER — DILTIAZEM HCL ER COATED BEADS 240 MG PO CP24
240.0000 mg | ORAL_CAPSULE | Freq: Every day | ORAL | Status: DC
  Filled 2023-04-14: qty 1

## 2023-04-14 MED ORDER — MORPHINE SULFATE (PF) 2 MG/ML IV SOLN
2.0000 mg | INTRAVENOUS | Status: DC | PRN
  Administered 2023-04-15 – 2023-04-17 (×5): 2 mg via INTRAVENOUS
  Filled 2023-04-14 (×6): qty 1

## 2023-04-14 MED ORDER — METRONIDAZOLE 500 MG/100ML IV SOLN: 500.0000 mg | Freq: Two times a day (BID) | INTRAVENOUS | Status: DC

## 2023-04-14 MED ORDER — PANTOPRAZOLE SODIUM 40 MG PO TBEC
40.0000 mg | DELAYED_RELEASE_TABLET | Freq: Every day | ORAL | Status: DC
  Administered 2023-04-16 – 2023-04-23 (×8): 40 mg via ORAL
  Filled 2023-04-14 (×9): qty 1

## 2023-04-14 MED ORDER — SODIUM CHLORIDE 0.9 % IV SOLN
2.0000 g | Freq: Once | INTRAVENOUS | Status: AC
  Administered 2023-04-14: 2 g via INTRAVENOUS
  Filled 2023-04-14: qty 20

## 2023-04-14 MED ORDER — HYDROCHLOROTHIAZIDE 12.5 MG PO TABS: 12.5000 mg | ORAL_TABLET | Freq: Every day | ORAL | Status: DC

## 2023-04-14 MED ORDER — GABAPENTIN 300 MG PO CAPS: 300.0000 mg | ORAL_CAPSULE | ORAL | Status: DC

## 2023-04-14 MED ORDER — METRONIDAZOLE 500 MG/100ML IV SOLN
500.0000 mg | Freq: Two times a day (BID) | INTRAVENOUS | Status: DC
  Administered 2023-04-15 – 2023-04-16 (×4): 500 mg via INTRAVENOUS
  Filled 2023-04-14 (×4): qty 100

## 2023-04-14 MED ORDER — SODIUM CHLORIDE 0.9 % IV SOLN
2.0000 g | INTRAVENOUS | Status: DC
  Administered 2023-04-15 – 2023-04-16 (×2): 2 g via INTRAVENOUS
  Filled 2023-04-14 (×2): qty 20

## 2023-04-14 MED ORDER — ROSUVASTATIN CALCIUM 20 MG PO TABS
40.0000 mg | ORAL_TABLET | Freq: Every day | ORAL | Status: DC
  Administered 2023-04-16 – 2023-04-23 (×8): 40 mg via ORAL
  Filled 2023-04-14 (×8): qty 2

## 2023-04-14 MED ORDER — OMEGA-3-ACID ETHYL ESTERS 1 G PO CAPS
1.0000 g | ORAL_CAPSULE | Freq: Every day | ORAL | Status: DC
  Administered 2023-04-16 – 2023-04-23 (×7): 1 g via ORAL
  Filled 2023-04-14 (×9): qty 1

## 2023-04-14 MED ORDER — METOPROLOL SUCCINATE ER 50 MG PO TB24: 100.0000 mg | ORAL_TABLET | Freq: Every day | ORAL | Status: DC

## 2023-04-14 MED ORDER — LOTILANER 0.25 % OP SOLN
1.0000 [drp] | Freq: Two times a day (BID) | OPHTHALMIC | Status: DC
  Administered 2023-04-15 – 2023-04-23 (×17): 1 [drp] via OPHTHALMIC
  Filled 2023-04-14: qty 10

## 2023-04-14 MED ORDER — GABAPENTIN 300 MG PO CAPS
300.0000 mg | ORAL_CAPSULE | Freq: Every day | ORAL | Status: DC
  Administered 2023-04-14: 300 mg via ORAL
  Filled 2023-04-14: qty 1

## 2023-04-14 MED ORDER — LISINOPRIL 20 MG PO TABS: 20.0000 mg | ORAL_TABLET | Freq: Every day | ORAL | Status: DC

## 2023-04-14 MED ORDER — METRONIDAZOLE 500 MG/100ML IV SOLN
500.0000 mg | Freq: Once | INTRAVENOUS | Status: AC
  Administered 2023-04-14: 500 mg via INTRAVENOUS
  Filled 2023-04-14: qty 100

## 2023-04-14 MED ORDER — IOHEXOL 350 MG/ML SOLN
50.0000 mL | Freq: Once | INTRAVENOUS | Status: AC | PRN
  Administered 2023-04-14: 60 mL via INTRAVENOUS

## 2023-04-14 NOTE — ED Provider Notes (Signed)
[place] EMERGENCY DEPARTMENT AT [facility] Provider Note   CSN: [id number] Arrival date & time: 04/14/23  1345     History  Chief Complaint  Patient presents with   Abdominal Pain    Angel Shelton is a 67 y.o. male.  With a past medical history of atrial fibrillation on Eliquis, type 2 diabetes, CVA, CKD and hypertension who presents to the ED for abdominal pain.  Reports 6 days of ongoing abdominal pain localized over the lower abdomen.  Feels similar to prior episodes of diverticulitis.  Denies GI bleeding nausea vomiting hematemesis and change in bowel habits.  Reports last alcohol was Tuesday before this began.  Denies chest pain shortness of breath fevers and chills.   Abdominal Pain      Home Medications Prior to Admission medications   Medication Sig Start Date End Date Taking? Authorizing Provider  Cyanocobalamin (VITAMIN B-12 PO) Take 1 tablet by mouth at bedtime.   Yes [provider]  cyclobenzaprine (FLEXERIL) 10 MG tablet Take 10 mg by mouth every 8 (eight) hours as needed for muscle spasms. 08/29/22  Yes [provider]  diltiazem (CARDIZEM CD) 240 MG 24 hr capsule Take 240 mg by mouth daily. 03/28/21  Yes [provider]  fenofibrate 160 MG tablet Take 160 mg by mouth daily.   Yes [provider]  gabapentin (NEURONTIN) 300 MG capsule Take 300 mg by mouth See admin instructions. Take 600mg  (2 capsules) by mouth every morning, and 300mg  (1 capsule) in the evening. 04/02/21  Yes [provider]  JARDIANCE 10 MG TABS tablet Take 10 mg by mouth daily. 04/25/22  Yes [provider]  Lactobacillus-Inulin (PROBIOTIC DIGESTIVE SUPPORT PO) Take 1 tablet by mouth daily.   Yes [provider]  lisinopril-hydrochlorothiazide (ZESTORETIC) 20-12.5 MG tablet Take 1 tablet by mouth daily.   Yes [provider]  metFORMIN (GLUCOPHAGE) 1000 MG tablet Take 1,000 mg by mouth 2 (two) times daily.  09/09/19  Yes [provider]  metoprolol succinate (TOPROL-XL) 100 MG 24 hr tablet Take 100 mg by mouth daily. 09/21/14  Yes [provider]  Omega-3 Fatty Acids (FISH OIL) 1000 MG CAPS Take 1,200 mg by mouth daily.   Yes [provider]  pantoprazole (PROTONIX) 40 MG tablet Take 1 tablet (40 mg total) by mouth daily before breakfast. 09/30/22  Yes Danis, [place]arr [place] III, MD  rosuvastatin (CRESTOR) 40 MG tablet Take 40 mg by mouth daily. 10/01/21  Yes [provider]  sildenafil (VIAGRA) 100 MG tablet Take 100 mg by mouth daily as needed. 08/26/21  Yes [provider]  TOUJEO SOLOSTAR 300 UNIT/ML Solostar Pen Inject 10 Units into the skin daily.   Yes [provider]  Wheat Dextrin (BENEFIBER DRINK MIX) PACK Take 1 Package by mouth daily.   Yes [provider]  XDEMVY 0.25 % SOLN Place 1 drop into both eyes daily. 03/25/23  Yes [provider]      Allergies    Patient has no known allergies.    Review of Systems   Review of Systems  Gastrointestinal:  Positive for abdominal pain.    Physical Exam Updated Vital Signs BP 102/71   Pulse 86   Temp 98.4 F (36.9 C) (Oral)   Resp 17   SpO2 100%  Physical Exam Vitals and nursing note reviewed.  HENT:     Head: Normocephalic and atraumatic.  Eyes:     Pupils: Pupils are equal, round, and reactive  to light.  Cardiovascular:     Rate and Rhythm: Normal rate and regular rhythm.  Pulmonary:     Effort: Pulmonary effort is normal.     Breath sounds: Normal breath sounds.  Abdominal:     Palpations: Abdomen is soft.     Tenderness: There is abdominal tenderness in the right lower quadrant, periumbilical area, suprapubic area and left lower quadrant.  Skin:    General: Skin is warm and dry.  Neurological:     Mental Status: He is alert.  Psychiatric:        Mood and Affect: Mood normal.     ED Results / Procedures / Treatments   Labs (all labs ordered are listed, but  only abnormal results are displayed) Labs Reviewed  CBC - Abnormal; Notable for the following components:      Result Value   WBC 14.7 (*)    Platelets 115 (*)    All other components within normal limits  COMPREHENSIVE METABOLIC PANEL - Abnormal; Notable for the following components:   Sodium 128 (*)    Chloride 97 (*)    CO2 18 (*)    BUN 31 (*)    Creatinine, Ser 2.04 (*)    Calcium 8.4 (*)    Total Protein 4.7 (*)    Albumin 2.2 (*)    Total Bilirubin 2.9 (*)    GFR, Estimated 35 (*)    All other components within normal limits  I-STAT CG4 LACTIC ACID, ED - Abnormal; Notable for the following components:   Lactic Acid, Venous 2.2 (*)    All other components within normal limits  LIPASE, BLOOD  URINALYSIS, ROUTINE W REFLEX MICROSCOPIC  I-STAT CG4 LACTIC ACID, ED  TYPE AND SCREEN    EKG EKG Interpretation Date/Time:  Monday April 14 2023 14:05:33 EST Ventricular Rate:  95 PR Interval:  121 QRS Duration:  87 QT Interval:  301 QTC Calculation: 379 R Axis:   75  Text Interpretation: Atrial fibrillation Nonspecific repol abnormality, diffuse leads Confirmed by Estelle June [phone]) on 04/14/2023 2:42:59 PM  Radiology No results found.  Procedures Procedures    Medications Ordered in ED Medications  cefTRIAXone (ROCEPHIN) 2 g in sodium chloride 0.9 % 100 mL IVPB (has no administration in time range)    And  metroNIDAZOLE (FLAGYL) IVPB 500 mg (has no administration in time range)  sodium chloride 0.9 % bolus 1,000 mL (has no administration in time range)  sodium chloride 0.9 % bolus 1,000 mL (0 mLs Intravenous Stopped 04/14/23 1508)    ED Course/ Medical Decision Making/ A&P Clinical Course as of 04/14/23 1637  Mon Apr 14, 2023  1632 Laboratory workup notable for hyponatremia.  AKI.  Elevated venous lactate of 2.2 and leukocytosis of 14.7  Clinical presentation and laboratory workup most concerning for intra-abdominal infection.  Will cover for suspected  intra-abdominal source with ceftriaxone and Flagyl and provide another liter of IV fluids.  Awaiting radiology read of CT abdomen pelvis and urinalysis [MP]  1636 I, Estelle June DO, am transitioning care of this patient to the oncoming provider pending CT abdomen pelvis read, reevaluation and disposition [MP]    Clinical Course User Index [MP] Royanne Foots, DO                                 Medical Decision Making 67 year old male with history as above presenting for stated complaint of abdominal pain.  6 days of lower abdominal pain.  No reported bleeding.  Initial vital signs notable for hypotension.  Afebrile and comfortable appearing overall.  Hypotension has resolved after initiation of IV fluids.  Exam notable for bilateral lower abdominal tenderness without rebound rigidity or guarding.  Lower suspicion for GI bleeding as patient denies evidence of bleeding and seems as though he is a reliable historian but he does have a history of GI bleeding.    Differential diagnosis includes: Acute intra-abdominal infectious/inflammatory process such as appendicitis, diverticulitis, pancreatitis and cholecystitis GI bleeding Mesenteric ischemia Urinary tract infection Atypical presentation for pneumonia Viral gastroenteritis  Will obtain laboratory workup including CBC with differential, metabolic panel, lipase and urinalysis  Amount and/or Complexity of Data Reviewed Labs: ordered. Radiology: ordered.  Risk Prescription drug management.           Final Clinical Impression(s) / ED Diagnoses Final diagnoses:  Abdominal pain, unspecified abdominal location  Sepsis, due to unspecified organism, unspecified whether acute organ dysfunction present [facility])    Rx / DC Orders ED Discharge Orders     None         Royanne Foots, DO 04/14/23 1637

## 2023-04-14 NOTE — Assessment & Plan Note (Addendum)
04-14-2023  A1c last year of 6.2.  No hypoglycemia on presentation. -Holding Jardiance due to hypotension -on 10 unit of Toujeo at home -place on SSI  04-16-2023 continue SSI.  04-20-2023  Restart long acting insulin lantus 10 units qday  04-21-2023 CBG improved. Continue to monitor on 10 u lantus and SSI.  04-22-2023 will increase lantus to 15 units qday. Will give extra 5 unit lantus today to give him a total of 15 units for today.

## 2023-04-14 NOTE — ED Triage Notes (Signed)
Patient via EMS for eval of RLQ pain, constant x 5 days. ETOH Tuesday, just prior to the pain beginning. Denies n/v/d. Hx diverticulitis and states this feels the same. Fell on Saturday and is on Eliquis.

## 2023-04-14 NOTE — Assessment & Plan Note (Signed)
04-14-2023 -Secondary to dehydration -has received 3L NS bolus  04-16-2023 resolved.

## 2023-04-14 NOTE — H&P (Addendum)
History and Physical    Patient: Angel Shelton WCB:[id number] DOB: June 26, 1955 DOA: 04/14/2023 DOS: the patient was seen and examined on 04/14/2023 PCP: Cleatis Polka., MD  Patient coming from: Home  Chief Complaint:  Chief Complaint  Patient presents with   Abdominal Pain   HPI: Angel Shelton is a 67 y.o. male with medical history significant of HTN, atrial fibrillation on Eliquis, CVA, type 2 diabetes, history of renal mass s/p right partial nephrectomy who presents with abdominal pain.  Patient reports left lower quadrant abdominal pain that has radiated to his right abdomen since last week.  Has been weak and mostly been staying in bed.  Had decreased oral intake.  However has still been compliant with his medication including his antihypertensives.  He denies any nausea, vomiting or diarrhea.  Patient is on Eliquis for atrial fibrillation and took last dose today at 1 PM.  On arrival to ED, he was afebrile, BP of 70/58 on room air.  CBC with leukocytosis of 14.7, hemoglobin of 13.5, thrombocytopenia of 115.  Lactate initially elevated at 2.2  CMP with hyponatremia of 128, chloride of 97, CO2 of 18 with normal anion gap of 13 although trending up from prior.  Creatinine also elevated at 2.04 with unclear baseline. LFTs are within normal limits but with hyperbilirubinemia 2.9.  Lipase within normal limits.  CT abdomen chest with calcified gallstone noted within gallbladder lumen and another likely impacted gallstone within the gallbladder neck.  Findings concerning for acute cholecystitis.  There is also nonobstructive bilateral nephrolithiasis. Acute cholecystitis was confirmed on right upper quadrant ultrasound.  General surgery was consulted and recommend keeping n.p.o. at midnight for surgical intervention in the morning.  He was started on IV Rocephin and Flagyl in the ED as well as given 3 L of normal saline bolus fluids.  Hospitalist consulted for  admission.  Review of Systems: As mentioned in the history of present illness. All other systems reviewed and are negative. Past Medical History:  Diagnosis Date   Alcohol abuse    Diabetes mellitus without complication (HCC)    Diverticulosis    GERD (gastroesophageal reflux disease)    Hyperlipidemia    Hypertension    Left atrial enlargement    Neuromuscular disorder (HCC)    neuropathy   Persistent atrial fibrillation (HCC)    CHA2DS2-VASc score 4 (age-10, HTN-1, DM 2-1, aortic plaque)   Stroke (HCC) 04/2021   Past Surgical History:  Procedure Laterality Date   APPENDECTOMY     BALLOON DILATION N/A 10/03/2021   Procedure: BALLOON DILATION;  Surgeon: Sherrilyn Rist, MD;  Location: [facility] ENDOSCOPY;  Service: Gastroenterology;  Laterality: N/A;   BIOPSY  10/03/2021   Procedure: BIOPSY;  Surgeon: Sherrilyn Rist, MD;  Location: [facility] ENDOSCOPY;  Service: Gastroenterology;;   CARDIAC CATHETERIZATION N/A 01/05/2015   Procedure: Left Heart Cath and Coronary Angiography;  Surgeon: Chrystie Nose, MD;  Location: [facility] INVASIVE CV LAB;  Service: Cardiovascular;; (For Abnormal Nuclear Stress Test) mild luminal irregularities of the LCx with mild areas of ectasia.  Calcified ostial D1 mild stenosis. ->  Mild/nonobstructive CAD.   CARDIOVERSION N/A 11/01/2014   Procedure: CARDIOVERSION;  Surgeon: Chrystie Nose, MD;  Location: [facility] ENDOSCOPY;  Service: Cardiovascular;  Laterality: N/A;   CARDIOVERSION N/A 02/24/2015   Procedure: CARDIOVERSION;  Surgeon: Chrystie Nose, MD;  Location: [facility] ENDOSCOPY;  Service: Cardiovascular;  Laterality: N/A;   CATARACT EXTRACTION Bilateral    COLON SURGERY N/A 2006  14 inches removed from diverticitis perforation   COLONOSCOPY     Per stoma   ESOPHAGOGASTRODUODENOSCOPY (EGD) WITH PROPOFOL N/A 10/03/2021   Procedure: ESOPHAGOGASTRODUODENOSCOPY (EGD) WITH PROPOFOL;  Surgeon: Sherrilyn Rist, MD;  Location: [facility] ENDOSCOPY;  Service: Gastroenterology;   Laterality: N/A;   EYE SURGERY Bilateral    Cat Sx   INNER EAR SURGERY     as a child   NM MYOVIEW LTD  12/08/2014   FALSE POSITIVE: Read asINTERMEDIATE RISK.  Partially reversible medium size moderate severity defect in the basal--mid-apical inferoseptal-inferior and apical walls.  Not gated due to A. fib. ->  Referred for cath- NON-OBSTRUCTIVE CAD   ROBOTIC ASSITED PARTIAL NEPHRECTOMY Right 05/23/2021   Procedure: XI RETROPERITONEAL ROBOTIC ASSITED PARTIAL NEPHRECTOMY;  Surgeon: Sebastian Ache, MD;  Location: [facility] ORS;  Service: Urology;  Laterality: Right;   TEE WITHOUT CARDIOVERSION N/A 11/01/2014   Procedure: TRANSESOPHAGEAL ECHOCARDIOGRAM (TEE) -unsuccesful DCCV;  Surgeon: Chrystie Nose, MD;  Location: [facility] ENDOSCOPY;  Service: Cardiovascular;; Mild concentric LVH.  EF 55 to 60%.  Normal wall motion.  Grade 3 aortic atheroma.  No source of cardiac emboli.  -->  Followed by unsuccessful DCCV.   Social History:  reports that he quit smoking about 9 years ago. His smoking use included cigarettes. He started smoking about 39 years ago. He has a 30 pack-year smoking history. He has never used smokeless tobacco. He reports current alcohol use of about 14.0 standard drinks of alcohol per week. He reports that he does not use drugs.  No Known Allergies  Family History  Problem Relation Age of Onset   Heart disease Father    Stroke Father    Diabetes Mother    Diabetes Maternal Grandmother    Stroke Maternal Grandmother    Heart attack Paternal Grandfather    Hodgkin's lymphoma Paternal Aunt    Hodgkin's lymphoma Paternal Uncle    Colon cancer Neg Hx    Esophageal cancer Neg Hx    Pancreatic cancer Neg Hx    Prostate cancer Neg Hx    Rectal cancer Neg Hx    Stomach cancer Neg Hx     Prior to Admission medications   Medication Sig Start Date End Date Taking? Authorizing Provider  Cyanocobalamin (VITAMIN B-12 PO) Take 1 tablet by mouth at bedtime.   Yes [provider]   cyclobenzaprine (FLEXERIL) 10 MG tablet Take 10 mg by mouth every 8 (eight) hours as needed for muscle spasms. 08/29/22  Yes [provider]  diltiazem (CARDIZEM CD) 240 MG 24 hr capsule Take 240 mg by mouth daily. 03/28/21  Yes [provider]  fenofibrate 160 MG tablet Take 160 mg by mouth daily.   Yes [provider]  gabapentin (NEURONTIN) 300 MG capsule Take 300 mg by mouth See admin instructions. Take 600mg  (2 capsules) by mouth every morning, and 300mg  (1 capsule) in the evening. 04/02/21  Yes [provider]  JARDIANCE 10 MG TABS tablet Take 10 mg by mouth daily. 04/25/22  Yes [provider]  Lactobacillus-Inulin (PROBIOTIC DIGESTIVE SUPPORT PO) Take 1 tablet by mouth daily.   Yes [provider]  lisinopril-hydrochlorothiazide (ZESTORETIC) 20-12.5 MG tablet Take 1 tablet by mouth daily.   Yes [provider]  metFORMIN (GLUCOPHAGE) 1000 MG tablet Take 1,000 mg by mouth 2 (two) times daily. 09/09/19  Yes [provider]  metoprolol succinate (TOPROL-XL) 100 MG 24 hr tablet Take 100 mg by mouth daily. 09/21/14  Yes [provider]  Omega-3 Fatty Acids (FISH OIL) 1000 MG CAPS Take 1,200 mg by mouth daily.   Yes [provider]  pantoprazole (PROTONIX) 40 MG tablet Take 1 tablet (40 mg total) by mouth daily before breakfast. 09/30/22  Yes Danis, Starr [place] III, MD  rosuvastatin (CRESTOR) 40 MG tablet Take 40 mg by mouth daily. 10/01/21  Yes [provider]  sildenafil (VIAGRA) 100 MG tablet Take 100 mg by mouth daily as needed. 08/26/21  Yes [provider]  TOUJEO SOLOSTAR 300 UNIT/ML Solostar Pen Inject 10 Units into the skin daily.   Yes [provider]  Wheat Dextrin (BENEFIBER DRINK MIX) PACK Take 1 Package by mouth daily.   Yes [provider]  XDEMVY 0.25 % SOLN Place 1 drop into both eyes daily. 03/25/23  Yes [provider]    Physical Exam: Vitals:   04/14/23  1805 04/14/23 1813 04/14/23 1920 04/14/23 2303  BP: 110/70  129/77 111/70  Pulse: 75   91  Resp: 19  17 19   Temp:  98.1 F (36.7 C)  98 F (36.7 C)  TempSrc:  Oral    SpO2: 94%   100%   Constitutional: NAD, calm, comfortable, nontoxic well-appearing elderly gentleman lying in bed Eyes: lids and conjunctivae normal ENMT: Mucous membranes are moist.  Neck: normal, supple Respiratory: clear to auscultation bilaterally, no wheezing, no crackles. Normal respiratory effort. No accessory muscle use.  Cardiovascular: Regular rate and rhythm, no murmurs / rubs / gallops. No extremity edema.  Abdomen: Soft, nondistended with mild bilateral quadrant tenderness.  No rebound tenderness, guarding or rigidity.   Musculoskeletal: no clubbing / cyanosis. No joint deformity upper and lower extremities. Good ROM, no contractures. Normal muscle tone.  Skin: no rashes, lesions, ulcers. No induration Neurologic: CN 2-12 grossly intact.  Psychiatric: Normal judgment and insight. Alert and oriented x 3. Normal mood. Data Reviewed:  See HPI  Assessment and Plan: * Acute cholecystitis - CT imaging with calcified gallstone noted within gallbladder lumen and another likely impacted gallstone within the gallbladder neck.   -General surgery consulted. Pt last took Eliquis at 1pm today. Will awaiting general surgery recommendation for timing of acute cholecystectomy.  Will keep n.p.o. past midnight. -Continue IV Rocephin and Flagyl  Hypotension BP of 70/58 on presentation. Now normotensive with 3L of NS bolus -Continue to hold antihypertensives overnight -Can resume lisinopril-HCTZ, diltiazem, metoprolol and Jardiance in the morning  Lactic acidosis -Secondary to dehydration  -has received 3L NS bolus  Abnormal CT of the abdomen - CT a/p with Bowel thickening of the hepatic flexure and mild bowel wall thickening of the ascending colon in the setting of colonic diverticulosis. Finding likely reactive in  etiology. Given slight focal irregular bowel thickening along the colonic diverticula, differential diagnosis includes much less likely concurrent acute diverticulitis. Consider colonoscopy status post treatment and status post complete resolution of inflammatory changes to exclude an underlying lesion. -there was also findings of possible thickening of the gastric fundal wall but patient has no signs of symptoms of gastric ulceration  Thrombocytopenia (HCC) -likely reactive  Hyponatremia -due to dehydration -Received 3 L of normal saline bolus - Follow repeat in the morning  History of CVA (cerebrovascular accident) -has already taken dose of aspirin today. Hold pending possible surgery tomorrow  Atrial fibrillation, permanent (HCC) - Not on anticoagulation - Resume diltiazem and metoprolol tomorrow due to hypotension on presentation  Dyslipidemia associated with type 2 diabetes mellitus (HCC) Continue statin and fish oil  DM2 (diabetes mellitus,  type 2) (HCC) - A1c last year of 6.2.  No hypoglycemia on presentation. -Holding Jardiance due to hypotension -on 10 unit of Toujeo at home -place on SSI      Advance Care Planning:   Code Status: Full Code   Consults: General Surgery  Family Communication: None at Bedside  Severity of Illness: The appropriate patient status for this patient is INPATIENT. Inpatient status is judged to be reasonable and necessary in order to provide the required intensity of service to ensure the patient's safety. The patient's presenting symptoms, physical exam findings, and initial radiographic and laboratory data in the context of their chronic comorbidities is felt to place them at high risk for further clinical deterioration. Furthermore, it is not anticipated that the patient will be medically stable for discharge from the hospital within 2 midnights of admission.   * I certify that at the point of admission it is my clinical judgment that the  patient will require inpatient hospital care spanning beyond 2 midnights from the point of admission due to high intensity of service, high risk for further deterioration and high frequency of surveillance required.*  Author: Anselm Jungling, DO 04/14/2023 11:45 PM  For on call review [email or web address].

## 2023-04-14 NOTE — Assessment & Plan Note (Addendum)
04-14-2023  Resume diltiazem and metoprolol tomorrow due to hypotension on presentation  04-16-2023 stable. Continue to hold cardizem and lopressor due to normal BP.  04-17-2023 had some fast HR last night. Cardizem-CD and Toprol-XL restarted last night. Will continue and monitor pt's HR. Keep K >4 and Mg >2.0. give po kcl and IV mag today. Restarting eliquis today.  04-18-2023 discussed with pharmacy. Apparently pt was in research trial regarding asa vs eliquis vs placebo. Unclear which research arm pt was in.  Neurology researcher(Sethi) contacted. Pt to hold eliquis during hospitalization. Pt to restart research trial drug at discharge.  04-19-2023 remains on Toprol-xl 100 mg daily, Cardizem CD daily. Rate controlled. Pharmacy going to find out if Eliquis can be restarted or if pt can go back on Research trial drug when he is at [facility].  Neurology wants to keep him off eliquis for now.  04-21-2023 restarted ASA 81 mg daily. I'm making executive decision on pt's care and will restart eliquis at discharge. Dtr states pt had a stroke when off Eliquis in the past.  04-22-2023 pt dizzy while working with PT. BP borderline with SBP 103. Will stop Cardizem CD for now. As see how his rate control is doing. Restart eliquis.

## 2023-04-14 NOTE — Assessment & Plan Note (Addendum)
04-14-2023 -has already taken dose of aspirin today. Hold pending possible surgery tomorrow  04-16-2023 stable.  04-20-2023 restart ASA 81 mg daily.  04-22-2023 will restart Eliquis. Continue ASA. He has mild CAD by [facility] in 2016

## 2023-04-14 NOTE — Assessment & Plan Note (Addendum)
04-14-2023 BP of 70/58 on presentation. Now normotensive with 3L of NS bolus -Continue to hold antihypertensives overnight -Can resume lisinopril-HCTZ, diltiazem, metoprolol and Jardiance in the morning  04-16-2023 resolved. Continue to hold antihypertensives.  04-19-2023 stable  04-21-2023 stable on Toprol-XL and cardizem-CD 240 mg daily. Both for his afib.  04-22-2023 has drop in BP today with PT. Will stop cardizem-CD. Continue with Toprol-XL 100 mg daily only. Monitor HR.

## 2023-04-14 NOTE — Assessment & Plan Note (Addendum)
04-14-2023 -likely reactive  04-16-2023 stable @ 95K  04-17-2023 improved. Plt cnt 113K today  04-19-2023 will repeat CBC in AM.  04-20-2023 awaiting CBC  04-21-2023 resolved. Plt count 252 yesterday.

## 2023-04-14 NOTE — ED Provider Notes (Signed)
    ED Course / MDM   Clinical Course as of 04/14/23 2108  [facility] Apr 14, 2023  1632 Laboratory workup notable for hyponatremia.  AKI.  Elevated venous lactate of 2.2 and leukocytosis of 14.7  Clinical presentation and laboratory workup most concerning for intra-abdominal infection.  Will cover for suspected intra-abdominal source with ceftriaxone and Flagyl and provide another liter of IV fluids.  Awaiting radiology read of CT abdomen pelvis and urinalysis [MP]  1636 I, Estelle June DO, am transitioning care of this patient to the oncoming provider pending CT abdomen pelvis read, reevaluation and disposition [MP]  1646 Received sign out pending CT abdomen. Abdomen pain for 2 days, labs with AKI, initially with hypotension which improved with fludis.  [WS]  2108 CT abdomen shows cholecystitis.  Patient also received ultrasound which shows cholecystitis.  He has already received antibiotics.  Discussed with Dr. Sarajane Marek with general surgery who will consult, request n.p.o. after midnight.  Patient feels well currently.  Discussed with Dr. Cyndia Bent with hospitalist to admit the patient. [WS]    Clinical Course User Index [MP] Royanne Foots, DO [WS] Lonell Grandchild, MD   Medical Decision Making Amount and/or Complexity of Data Reviewed Labs: ordered. Radiology: ordered.  Risk Prescription drug management. Decision regarding hospitalization.         Lonell Grandchild, MD 04/14/23 2108

## 2023-04-14 NOTE — Assessment & Plan Note (Addendum)
04-14-2023- CT a/p with Bowel thickening of the hepatic flexure and mild bowel wall thickening of the ascending colon in the setting of colonic diverticulosis. Finding likely reactive in  etiology. Given slight focal irregular bowel thickening along the colonic diverticula, differential diagnosis includes much less likely concurrent acute diverticulitis. Consider colonoscopy  status post treatment and status post complete resolution of inflammatory changes to exclude an underlying lesion. -there was also findings of possible thickening of the gastric fundal wall but patient has no signs of symptoms of gastric ulceration  04-16-2023 - will need outpatient GI referral for EGD/colonoscopy.

## 2023-04-14 NOTE — Assessment & Plan Note (Addendum)
04-14-2023  CT imaging with calcified gallstone noted within gallbladder lumen and another likely impacted gallstone within the gallbladder neck.  -General surgery consulted. Pt last took Eliquis at 1pm today. Will awaiting general surgery recommendation for timing of acute cholecystectomy.  Will keep n.p.o. past midnight. -Continue IV Rocephin and Flagyl  04-16-2023 general surgery has requested IR place cholecystostomy tube today. Continue IV ABX with rocephin, flagyl  04-17-2023 will change to po abx (augmentin). IR will contact patient to schedule outpatient imaging in 6 weeks. Will ask surgery if Eliquis can be restarted. I see no contraindications. Pt tolerated clear liquids for breakfast. Will advance to low fat diet.  04-18-2023 tolerating low fat diet.   04-19-2023 stable. On po augmentin and tolerating this well.  04-21-2023 stable. On augmentin. Culture from cholecystostomy tube insertion growing klebsiella that is sensitive to unasyn.  04-22-2023 cultures from cholecystostomy tube growing e. Coli and Klebsiella.  will give a total of 14 days of abx for acute cholecystitis including IV Abx and PO abx. He has 3 days of IV abx. He will be on a total of 11 days of augmentin. Have changed end date of augmentin.

## 2023-04-14 NOTE — Assessment & Plan Note (Addendum)
04-14-2023 -due to dehydration. -Received 3 L of normal saline bolus. - Follow repeat in the morning  04-16-2023 chronic. Baseline around 128  04-17-2023 Na 124. Likely due to low solute intake last 24-48 hours. Pt placed back on low fat diet today.  04-18-2023 Na up to 127 after starting back on low fat diet. TSH is normal at 3.79  04-19-2023 pt tolerating low fat diet. Will repeat BMP in AM.  04-20-2023 awaiting BMP  04-21-2023 Na 124 yesterday. Chronic.

## 2023-04-14 NOTE — Assessment & Plan Note (Addendum)
04-14-2023 Continue statin and fish oil  04-16-2023 continue crestor 40 mg every day, lovaza every day, fenofibrate 160 mg every day  04-21-2023 stable.

## 2023-04-14 NOTE — ED Notes (Signed)
ED TO INPATIENT HANDOFF REPORT  ED Nurse Name and Phone #: Francene Castle [phone]  S Name/Age/Gender Angel Shelton 67 y.o. male Room/Bed: 011C/011C  Code Status   Code Status: Full Code  Home/SNF/Other Home Patient oriented to: self, place, time, and situation Is this baseline? Yes   Triage Complete: Triage complete  Chief Complaint Acute cholecystitis [K81.0]  Triage Note Patient via EMS for eval of RLQ pain, constant x 5 days. ETOH Tuesday, just prior to the pain beginning. Denies n/v/d. Hx diverticulitis and states this feels the same. Fell on Saturday and is on Eliquis.    Allergies No Known Allergies  Level of Care/Admitting Diagnosis ED Disposition     ED Disposition  Admit   Condition  --   Comment  Hospital Area: [facility] [[id number]]  Level of Care: Telemetry Medical [104]  May admit patient to [facility] or [facility] if equivalent level of care is available:: No  Covid Evaluation: Asymptomatic - no recent exposure (last 10 days) testing not required  Diagnosis: Acute cholecystitis [575.0.ICD-9-CM]  Admitting Physician: Anselm Jungling [[id number]]  Attending Physician: Anselm Jungling [[id number]]  Certification:: I certify this patient will need inpatient services for at least 2 midnights  Expected Medical Readiness: 04/16/2023          B Medical/Surgery History Past Medical History:  Diagnosis Date   Alcohol abuse    Diabetes mellitus without complication (HCC)    Diverticulosis    GERD (gastroesophageal reflux disease)    Hyperlipidemia    Hypertension    Left atrial enlargement    Neuromuscular disorder (HCC)    neuropathy   Persistent atrial fibrillation (HCC)    CHA2DS2-VASc score 4 (age-61, HTN-1, DM 2-1, aortic plaque)   Stroke (HCC) 04/2021   Past Surgical History:  Procedure Laterality Date   APPENDECTOMY     BALLOON DILATION N/A 10/03/2021   Procedure: BALLOON DILATION;  Surgeon: Sherrilyn Rist, MD;  Location:  [facility] ENDOSCOPY;  Service: Gastroenterology;  Laterality: N/A;   BIOPSY  10/03/2021   Procedure: BIOPSY;  Surgeon: Sherrilyn Rist, MD;  Location: [facility] ENDOSCOPY;  Service: Gastroenterology;;   CARDIAC CATHETERIZATION N/A 01/05/2015   Procedure: Left Heart Cath and Coronary Angiography;  Surgeon: Chrystie Nose, MD;  Location: [facility] INVASIVE CV LAB;  Service: Cardiovascular;; (For Abnormal Nuclear Stress Test) mild luminal irregularities of the LCx with mild areas of ectasia.  Calcified ostial D1 mild stenosis. ->  Mild/nonobstructive CAD.   CARDIOVERSION N/A 11/01/2014   Procedure: CARDIOVERSION;  Surgeon: Chrystie Nose, MD;  Location: [facility] ENDOSCOPY;  Service: Cardiovascular;  Laterality: N/A;   CARDIOVERSION N/A 02/24/2015   Procedure: CARDIOVERSION;  Surgeon: Chrystie Nose, MD;  Location: [facility] ENDOSCOPY;  Service: Cardiovascular;  Laterality: N/A;   CATARACT EXTRACTION Bilateral    COLON SURGERY N/A 2006   14 inches removed from diverticitis perforation   COLONOSCOPY     Per stoma   ESOPHAGOGASTRODUODENOSCOPY (EGD) WITH PROPOFOL N/A 10/03/2021   Procedure: ESOPHAGOGASTRODUODENOSCOPY (EGD) WITH PROPOFOL;  Surgeon: Sherrilyn Rist, MD;  Location: [facility] ENDOSCOPY;  Service: Gastroenterology;  Laterality: N/A;   EYE SURGERY Bilateral    Cat Sx   INNER EAR SURGERY     as a child   NM MYOVIEW LTD  12/08/2014   FALSE POSITIVE: Read asINTERMEDIATE RISK.  Partially reversible medium size moderate severity defect in the basal--mid-apical inferoseptal-inferior and apical walls.  Not gated due to A. fib. ->  Referred  for cath- NON-OBSTRUCTIVE CAD   ROBOTIC ASSITED PARTIAL NEPHRECTOMY Right 05/23/2021   Procedure: XI RETROPERITONEAL ROBOTIC ASSITED PARTIAL NEPHRECTOMY;  Surgeon: Sebastian Ache, MD;  Location: [facility] ORS;  Service: Urology;  Laterality: Right;   TEE WITHOUT CARDIOVERSION N/A 11/01/2014   Procedure: TRANSESOPHAGEAL ECHOCARDIOGRAM (TEE) -unsuccesful DCCV;  Surgeon: Chrystie Nose, MD;   Location: [facility] ENDOSCOPY;  Service: Cardiovascular;; Mild concentric LVH.  EF 55 to 60%.  Normal wall motion.  Grade 3 aortic atheroma.  No source of cardiac emboli.  -->  Followed by unsuccessful DCCV.     A IV Location/Drains/Wounds Patient Lines/Drains/Airways Status     Active Line/Drains/Airways     Name Placement date Placement time Site Days   Peripheral IV 04/14/23 20 G Distal;Left;Posterior Forearm 04/14/23  1356  Forearm  less than 1   Peripheral IV 04/14/23 18 G Anterior;Proximal;Right Forearm 04/14/23  1430  Forearm  less than 1            Intake/Output Last 24 hours  Intake/Output Summary (Last 24 hours) at 04/14/2023 2325 Last data filed at 04/14/2023 2220 Gross per 24 hour  Intake 3200 ml  Output --  Net 3200 ml    Labs/Imaging Results for orders placed or performed during the hospital encounter of 04/14/23 (from the past 48 hour(s))  Type and screen [facility]     Status: None (Preliminary result)   Collection Time: 04/14/23  2:17 PM  Result Value Ref Range   ABO/RH(D) A POS    Antibody Screen POS    Sample Expiration 04/17/2023,2359    Antibody Identification ANTI E    Unit Number [id number]    Blood Component Type RED CELLS,LR    Unit division 00    Status of Unit ALLOCATED    Donor AG Type NEGATIVE FOR E ANTIGEN    Transfusion Status OK TO TRANSFUSE    Crossmatch Result COMPATIBLE    Unit Number [id number]    Blood Component Type RED CELLS,LR    Unit division 00    Status of Unit ALLOCATED    Donor AG Type NEGATIVE FOR E ANTIGEN    Transfusion Status OK TO TRANSFUSE    Crossmatch Result COMPATIBLE   CBC     Status: Abnormal   Collection Time: 04/14/23  2:23 PM  Result Value Ref Range   WBC 14.7 (H) 4.0 - 10.5 K/uL   RBC 4.25 4.22 - 5.81 MIL/uL   Hemoglobin 13.5 13.0 - 17.0 g/dL   HCT 44.0 10.2 - 72.5 %   MCV 94.1 80.0 - 100.0 fL   MCH 31.8 26.0 - 34.0 pg   MCHC 33.8 30.0 - 36.0 g/dL   RDW 36.6 44.0 - 34.7 %    Platelets 115 (L) 150 - 400 K/uL    Comment: REPEATED TO VERIFY   nRBC 0.0 0.0 - 0.2 %    Comment: Performed at [facility], [address], [place], [place] [address]  I-Stat Lactic Acid     Status: Abnormal   Collection Time: 04/14/23  2:37 PM  Result Value Ref Range   Lactic Acid, Venous 2.2 (HH) 0.5 - 1.9 mmol/L   Comment NOTIFIED PHYSICIAN   Comprehensive metabolic panel     Status: Abnormal   Collection Time: 04/14/23  3:35 PM  Result Value Ref Range   Sodium 128 (L) 135 - 145 mmol/L   Potassium 3.5 3.5 - 5.1 mmol/L    Comment: HEMOLYSIS AT THIS LEVEL MAY AFFECT RESULT  Chloride 97 (L) 98 - 111 mmol/L   CO2 18 (L) 22 - 32 mmol/L   Glucose, Bld 70 70 - 99 mg/dL    Comment: Glucose reference range applies only to samples taken after fasting for at least 8 hours.   BUN 31 (H) 8 - 23 mg/dL   Creatinine, Ser 3.24 (H) 0.61 - 1.24 mg/dL   Calcium 8.4 (L) 8.9 - 10.3 mg/dL   Total Protein 4.7 (L) 6.5 - 8.1 g/dL   Albumin 2.2 (L) 3.5 - 5.0 g/dL   AST 35 15 - 41 U/L    Comment: HEMOLYSIS AT THIS LEVEL MAY AFFECT RESULT   ALT 20 0 - 44 U/L    Comment: HEMOLYSIS AT THIS LEVEL MAY AFFECT RESULT   Alkaline Phosphatase 39 38 - 126 U/L   Total Bilirubin 2.9 (H) <1.2 mg/dL    Comment: HEMOLYSIS AT THIS LEVEL MAY AFFECT RESULT   GFR, Estimated 35 (L) >60 mL/min    Comment: (NOTE) Calculated using the CKD-EPI Creatinine Equation (2021)    Anion gap 13 5 - 15    Comment: Performed at [facility], [address], [place], [place] [address]  Lipase, blood     Status: None   Collection Time: 04/14/23  3:35 PM  Result Value Ref Range   Lipase 51 11 - 51 U/L    Comment: Performed at [facility], [address] [place], [place] [address]  I-Stat Lactic Acid     Status: None   Collection Time: 04/14/23  4:43 PM  Result Value Ref Range   Lactic Acid, Venous 1.6 0.5 - 1.9 mmol/L   US Abdomen Limited RUQ (LIVER/GB)  Result Date: 04/14/2023 CLINICAL DATA:  Abdominal  pain EXAM: ULTRASOUND ABDOMEN LIMITED RIGHT UPPER QUADRANT COMPARISON:  CT today. FINDINGS: Gallbladder: There is marked gallbladder wall thickening measuring up to 9 mm with pericholecystic fluid and tenderness over the gallbladder during the study compatible with acute cholecystitis. Sludge and stones within the gallbladder. Common bile duct: Diameter: Normal caliber, 5 mm Liver: No focal lesion identified. Within normal limits in parenchymal echogenicity. Portal vein is patent on color Doppler imaging with normal direction of blood flow towards the liver. Other: None. IMPRESSION: Cholelithiasis with evidence of acute cholecystitis. Electronically Signed   By: Charlett Nose M.D.   On: 04/14/2023 22:19   CT ABDOMEN PELVIS W CONTRAST  Result Date: 04/14/2023 CLINICAL DATA:  LLQ abdominal pain abdominal pain, ?dtic. History of renal cell carcinoma. EXAM: CT ABDOMEN AND PELVIS WITH CONTRAST TECHNIQUE: Multidetector CT imaging of the abdomen and pelvis was performed using the standard protocol following bolus administration of intravenous contrast. RADIATION DOSE REDUCTION: This exam was performed according to the departmental dose-optimization program which includes automated exposure control, adjustment of the mA and/or kV according to patient size and/or use of iterative reconstruction technique. CONTRAST:  60mL OMNIPAQUE IOHEXOL 350 MG/ML SOLN COMPARISON:  CT abdomen pelvis 09/30/2004, ultrasound abdomen 10/02/2021, MRI abdomen 05/22/2020, CT abdomen 03/21/2021 FINDINGS: Lower chest: Tiny hiatal hernia.  No acute abnormality. Hepatobiliary: No focal liver abnormality. Calcified gallstone noted within the gallbladder lumen. Associated marked gallbladder wall thickening or pericholecystic fluid. A 1.6 cm gallstone likely impacted within the gallbladder neck. No biliary dilatation. Pancreas: No focal lesion. Normal pancreatic contour. No surrounding inflammatory changes. No main pancreatic ductal dilatation.  Spleen: Normal in size without focal abnormality. Adrenals/Urinary Tract: No adrenal nodule bilaterally. Bilateral kidneys enhance symmetrically.  Partial right nephrectomy. No hydronephrosis. No hydroureter. Left  nephrolithiasis measuring to 7 mm. Punctate right nephrolithiasis. No ureterolithiasis bilaterally. Stable in size fluid density lesions within the kidneys likely represent simple renal cysts. Simple renal cysts, in the absence of clinically indicated signs/symptoms, require no independent follow-up. The urinary bladder is distended with urine with mild trabeculations noted. On delayed imaging, there is no urothelial wall thickening and there are no filling defects in the opacified portions of the bilateral collecting systems or ureters. Stomach/Bowel: Incidentally noted possible thinning of the gastric fundal wall (3:6). The stomach is otherwise within normal limits. No evidence of small bowel wall thickening or dilatation. Colonic diverticulosis. Slightly irregular bowel wall thickening of the hepatic flexure along the a focal diverticula (8:53). Mild bowel wall thickening of the ascending colon. Right appendix appears normal. Vascular/Lymphatic: No abdominal aorta or iliac aneurysm. Severe atherosclerotic plaque of the aorta and its branches. No abdominal, pelvic, or inguinal lymphadenopathy. Reproductive: Prostate is prominent in size. Other: Right upper quadrant mesenteric fat stranding and trace free fluid. No intraperitoneal free gas. No organized fluid collection. Musculoskeletal: No abdominal wall hernia or abnormality. No suspicious lytic or blastic osseous lesions. No acute displaced fracture. Multilevel degenerative changes of the spine. Intervertebral disc space vacuum phenomenon at the L3-L4 and L4-L5 levels. IMPRESSION: 1. Cholelithiasis with acute cholecystitis. Associated 1.6 cm gallstone likely impacted within the gallbladder neck. No CT evidence of choledocholithiasis. Recommend surgical  consultation. 2. Bowel thickening of the hepatic flexure and mild bowel wall thickening of the ascending colon in the setting of colonic diverticulosis. Finding likely reactive in etiology. Given slight focal irregular bowel thickening along the colonic diverticula, differential diagnosis includes much less likely concurrent acute diverticulitis. Consider colonoscopy status post treatment and status post complete resolution of inflammatory changes to exclude an underlying lesion. 3. Incidentally noted possible thinning of the gastric fundal wall. Limited evaluation due to under distension of the stomach. Recommend correlation with signs and symptoms of gastric ulceration. Consider outpatient endoscopy for direct visualization if clinically indicated. 4. Nonobstructive bilateral nephrolithiasis measuring up to 7 mm on the left and punctate on the right. 5. Status post partial right nephrectomy. 6. Prostate is prominent in size with associated urinary bladder distension with urine and mild urinary bladder wall trabeculations. Correlate with signs and symptoms of obstructive uropathy. 7.  Aortic Atherosclerosis (ICD10-I70.0)-severe. Electronically Signed   By: Tish Frederickson M.D.   On: 04/14/2023 20:12   DG Chest Portable 1 View  Result Date: 04/14/2023 CLINICAL DATA:  Fall. EXAM: PORTABLE CHEST 1 VIEW COMPARISON:  Chest radiograph dated March 18, 2022. CT chest dated May 28, 2022. FINDINGS: Patient is rotated to the right. The heart size and mediastinal contours are otherwise within normal limits. Aortic atherosclerosis. Coarse interstitial markings. No focal consolidation, sizeable pleural effusion, or pneumothorax. Remote bilateral healed rib fractures. No acute osseous abnormality. IMPRESSION: 1. No acute findings in the chest. 2. Emphysema. Electronically Signed   By: Hart Robinsons M.D.   On: 04/14/2023 17:18    Pending Labs Unresulted Labs (From admission, onward)     Start     Ordered    04/15/23 0500  CBC  Tomorrow morning,   R        04/14/23 2319   04/15/23 0500  Basic metabolic panel  Tomorrow morning,   R        04/14/23 2319   04/14/23 2318  HIV Antibody (routine testing w rflx)  (HIV Antibody (Routine testing w reflex) panel)  Once,   R  04/14/23 2319   04/14/23 1352  Urinalysis, Routine w reflex microscopic -Urine, Clean Catch  Once,   URGENT       Question:  Specimen Source  Answer:  Urine, Clean Catch   04/14/23 1352            Vitals/Pain Today's Vitals   04/14/23 1805 04/14/23 1813 04/14/23 1920 04/14/23 2303  BP: 110/70  129/77 111/70  Pulse: 75   91  Resp: 19  17 19   Temp:  98.1 F (36.7 C)  98 F (36.7 C)  TempSrc:  Oral    SpO2: 94%   100%  PainSc:        Isolation Precautions No active isolations  Medications Medications  diltiazem (CARDIZEM CD) 24 hr capsule 240 mg (has no administration in time range)  fenofibrate tablet 160 mg (has no administration in time range)  metoprolol succinate (TOPROL-XL) 24 hr tablet 100 mg (has no administration in time range)  rosuvastatin (CRESTOR) tablet 40 mg (has no administration in time range)  pantoprazole (PROTONIX) EC tablet 40 mg (has no administration in time range)  omega-3 acid ethyl esters (LOVAZA) capsule 1 g (has no administration in time range)  Lotilaner 0.25 % SOLN 1 drop (has no administration in time range)  lisinopril (ZESTRIL) tablet 20 mg (has no administration in time range)    And  hydrochlorothiazide (HYDRODIURIL) tablet 12.5 mg (has no administration in time range)  gabapentin (NEURONTIN) capsule 600 mg (has no administration in time range)    And  gabapentin (NEURONTIN) capsule 300 mg (has no administration in time range)  sodium chloride 0.9 % bolus 1,000 mL (0 mLs Intravenous Stopped 04/14/23 1508)  cefTRIAXone (ROCEPHIN) 2 g in sodium chloride 0.9 % 100 mL IVPB (0 g Intravenous Stopped 04/14/23 1805)    And  metroNIDAZOLE (FLAGYL) IVPB 500 mg (0 mg Intravenous  Stopped 04/14/23 1913)  sodium chloride 0.9 % bolus 1,000 mL (0 mLs Intravenous Stopped 04/14/23 1805)  iohexol (OMNIPAQUE) 350 MG/ML injection 50 mL (60 mLs Intravenous Contrast Given 04/14/23 1701)  sodium chloride 0.9 % bolus 1,000 mL (0 mLs Intravenous Stopped 04/14/23 2220)    Mobility walks     Focused Assessments    R Recommendations: See Admitting Provider Note  Report given to:   Additional Notes:

## 2023-04-14 NOTE — Consult Note (Signed)
Angel Shelton 01-19-1956  [id number].    Requesting MD: Suezanne Jacquet Chief Complaint/Reason for Consult: Cholecystitis  HPI:  67 y/o M w/ a hx of GERD, HTN, HLD, CVA, Afib on Eliquis, diverticulitis s/p Hartmann's followed by reversal and right renal mass s/p RP robotic partial nephrectomy who presents with 5 days of abdominal pain and general malaise with imaging and exam c/f acute cholecystitis.  He reports that his pain started last Wednesday.  He has been able to tolerate some liquids but has not had much of an appetite.  CT shows a large gallstone with significant GB wall thickening and pericholecystic fluid.  Labs notable for Cr 2.0, WBC 14, Lactate 2.2, Tbili 2.9. Remainder of LFTs WNL.    ROS: Review of Systems  Constitutional:  Positive for malaise/fatigue.  HENT: Negative.    Eyes: Negative.   Respiratory: Negative.    Cardiovascular: Negative.   Gastrointestinal:  Positive for abdominal pain and nausea.  Genitourinary: Negative.   Musculoskeletal: Negative.   Skin: Negative.   Neurological: Negative.   Endo/Heme/Allergies: Negative.   Psychiatric/Behavioral: Negative.      Family History  Problem Relation Age of Onset   Heart disease Father    Stroke Father    Diabetes Mother    Diabetes Maternal Grandmother    Stroke Maternal Grandmother    Heart attack Paternal Grandfather    Hodgkin's lymphoma Paternal Aunt    Hodgkin's lymphoma Paternal Uncle    Colon cancer Neg Hx    Esophageal cancer Neg Hx    Pancreatic cancer Neg Hx    Prostate cancer Neg Hx    Rectal cancer Neg Hx    Stomach cancer Neg Hx     Past Medical History:  Diagnosis Date   Alcohol abuse    Diabetes mellitus without complication (HCC)    Diverticulosis    GERD (gastroesophageal reflux disease)    Hyperlipidemia    Hypertension    Left atrial enlargement    Neuromuscular disorder (HCC)    neuropathy   Persistent atrial fibrillation (HCC)    CHA2DS2-VASc score 4 (age-83, HTN-1,  DM 2-1, aortic plaque)   Stroke (HCC) 04/2021    Past Surgical History:  Procedure Laterality Date   APPENDECTOMY     BALLOON DILATION N/A 10/03/2021   Procedure: BALLOON DILATION;  Surgeon: Sherrilyn Rist, MD;  Location: [facility] ENDOSCOPY;  Service: Gastroenterology;  Laterality: N/A;   BIOPSY  10/03/2021   Procedure: BIOPSY;  Surgeon: Sherrilyn Rist, MD;  Location: [facility] ENDOSCOPY;  Service: Gastroenterology;;   CARDIAC CATHETERIZATION N/A 01/05/2015   Procedure: Left Heart Cath and Coronary Angiography;  Surgeon: Chrystie Nose, MD;  Location: [facility] INVASIVE CV LAB;  Service: Cardiovascular;; (For Abnormal Nuclear Stress Test) mild luminal irregularities of the LCx with mild areas of ectasia.  Calcified ostial D1 mild stenosis. ->  Mild/nonobstructive CAD.   CARDIOVERSION N/A 11/01/2014   Procedure: CARDIOVERSION;  Surgeon: Chrystie Nose, MD;  Location: [facility] ENDOSCOPY;  Service: Cardiovascular;  Laterality: N/A;   CARDIOVERSION N/A 02/24/2015   Procedure: CARDIOVERSION;  Surgeon: Chrystie Nose, MD;  Location: [facility] ENDOSCOPY;  Service: Cardiovascular;  Laterality: N/A;   CATARACT EXTRACTION Bilateral    COLON SURGERY N/A 2006   14 inches removed from diverticitis perforation   COLONOSCOPY     Per stoma   ESOPHAGOGASTRODUODENOSCOPY (EGD) WITH PROPOFOL N/A 10/03/2021   Procedure: ESOPHAGOGASTRODUODENOSCOPY (EGD) WITH PROPOFOL;  Surgeon: Sherrilyn Rist, MD;  Location: [facility] ENDOSCOPY;  Service: Gastroenterology;  Laterality: N/A;   EYE SURGERY Bilateral    Cat Sx   INNER EAR SURGERY     as a child   NM MYOVIEW LTD  12/08/2014   FALSE POSITIVE: Read asINTERMEDIATE RISK.  Partially reversible medium size moderate severity defect in the basal--mid-apical inferoseptal-inferior and apical walls.  Not gated due to A. fib. ->  Referred for cath- NON-OBSTRUCTIVE CAD   ROBOTIC ASSITED PARTIAL NEPHRECTOMY Right 05/23/2021   Procedure: XI RETROPERITONEAL ROBOTIC ASSITED PARTIAL NEPHRECTOMY;  Surgeon:  Sebastian Ache, MD;  Location: [facility] ORS;  Service: Urology;  Laterality: Right;   TEE WITHOUT CARDIOVERSION N/A 11/01/2014   Procedure: TRANSESOPHAGEAL ECHOCARDIOGRAM (TEE) -unsuccesful DCCV;  Surgeon: Chrystie Nose, MD;  Location: [facility] ENDOSCOPY;  Service: Cardiovascular;; Mild concentric LVH.  EF 55 to 60%.  Normal wall motion.  Grade 3 aortic atheroma.  No source of cardiac emboli.  -->  Followed by unsuccessful DCCV.    Social History:  reports that he quit smoking about 9 years ago. His smoking use included cigarettes. He started smoking about 39 years ago. He has a 30 pack-year smoking history. He has never used smokeless tobacco. He reports current alcohol use of about 14.0 standard drinks of alcohol per week. He reports that he does not use drugs.  Allergies: No Known Allergies  (Not in a hospital admission)   Physical Exam: Blood pressure 129/77, pulse 75, temperature 98.1 F (36.7 C), temperature source Oral, resp. rate 17, SpO2 94%. Gen: male resting in bed, NAD Abd: soft, non-distended, TTP in the RLQ and RUQ, no rebound/guarding, no peritoneal signs  Results for orders placed or performed during the hospital encounter of 04/14/23 (from the past 48 hour(s))  Type and screen [facility]     Status: None (Preliminary result)   Collection Time: 04/14/23  2:17 PM  Result Value Ref Range   ABO/RH(D) A POS    Antibody Screen POS    Sample Expiration 04/17/2023,2359    Antibody Identification ANTI E    Unit Number [id number]    Blood Component Type RED CELLS,LR    Unit division 00    Status of Unit ALLOCATED    Donor AG Type NEGATIVE FOR E ANTIGEN    Transfusion Status OK TO TRANSFUSE    Crossmatch Result COMPATIBLE    Unit Number [id number]    Blood Component Type RED CELLS,LR    Unit division 00    Status of Unit ALLOCATED    Donor AG Type NEGATIVE FOR E ANTIGEN    Transfusion Status OK TO TRANSFUSE    Crossmatch Result COMPATIBLE   CBC     Status:  Abnormal   Collection Time: 04/14/23  2:23 PM  Result Value Ref Range   WBC 14.7 (H) 4.0 - 10.5 K/uL   RBC 4.25 4.22 - 5.81 MIL/uL   Hemoglobin 13.5 13.0 - 17.0 g/dL   HCT 30.8 65.7 - 84.6 %   MCV 94.1 80.0 - 100.0 fL   MCH 31.8 26.0 - 34.0 pg   MCHC 33.8 30.0 - 36.0 g/dL   RDW 96.2 95.2 - 84.1 %   Platelets 115 (L) 150 - 400 K/uL    Comment: REPEATED TO VERIFY   nRBC 0.0 0.0 - 0.2 %    Comment: Performed at [facility], [address] [place], [place] [address]  I-Stat Lactic Acid     Status: Abnormal   Collection Time: 04/14/23  2:37 PM  Result Value Ref  Range   Lactic Acid, Venous 2.2 (HH) 0.5 - 1.9 mmol/L   Comment NOTIFIED PHYSICIAN   Comprehensive metabolic panel     Status: Abnormal   Collection Time: 04/14/23  3:35 PM  Result Value Ref Range   Sodium 128 (L) 135 - 145 mmol/L   Potassium 3.5 3.5 - 5.1 mmol/L    Comment: HEMOLYSIS AT THIS LEVEL MAY AFFECT RESULT   Chloride 97 (L) 98 - 111 mmol/L   CO2 18 (L) 22 - 32 mmol/L   Glucose, Bld 70 70 - 99 mg/dL    Comment: Glucose reference range applies only to samples taken after fasting for at least 8 hours.   BUN 31 (H) 8 - 23 mg/dL   Creatinine, Ser 6.04 (H) 0.61 - 1.24 mg/dL   Calcium 8.4 (L) 8.9 - 10.3 mg/dL   Total Protein 4.7 (L) 6.5 - 8.1 g/dL   Albumin 2.2 (L) 3.5 - 5.0 g/dL   AST 35 15 - 41 U/L    Comment: HEMOLYSIS AT THIS LEVEL MAY AFFECT RESULT   ALT 20 0 - 44 U/L    Comment: HEMOLYSIS AT THIS LEVEL MAY AFFECT RESULT   Alkaline Phosphatase 39 38 - 126 U/L   Total Bilirubin 2.9 (H) <1.2 mg/dL    Comment: HEMOLYSIS AT THIS LEVEL MAY AFFECT RESULT   GFR, Estimated 35 (L) >60 mL/min    Comment: (NOTE) Calculated using the CKD-EPI Creatinine Equation (2021)    Anion gap 13 5 - 15    Comment: Performed at [facility], [address], [place], [place] [address]  Lipase, blood     Status: None   Collection Time: 04/14/23  3:35 PM  Result Value Ref Range   Lipase 51 11 - 51 U/L    Comment:  Performed at [facility], [address], [place], [place] [address]  I-Stat Lactic Acid     Status: None   Collection Time: 04/14/23  4:43 PM  Result Value Ref Range   Lactic Acid, Venous 1.6 0.5 - 1.9 mmol/L   CT ABDOMEN PELVIS W CONTRAST  Result Date: 04/14/2023 CLINICAL DATA:  LLQ abdominal pain abdominal pain, ?dtic. History of renal cell carcinoma. EXAM: CT ABDOMEN AND PELVIS WITH CONTRAST TECHNIQUE: Multidetector CT imaging of the abdomen and pelvis was performed using the standard protocol following bolus administration of intravenous contrast. RADIATION DOSE REDUCTION: This exam was performed according to the departmental dose-optimization program which includes automated exposure control, adjustment of the mA and/or kV according to patient size and/or use of iterative reconstruction technique. CONTRAST:  60mL OMNIPAQUE IOHEXOL 350 MG/ML SOLN COMPARISON:  CT abdomen pelvis 09/30/2004, ultrasound abdomen 10/02/2021, MRI abdomen 05/22/2020, CT abdomen 03/21/2021 FINDINGS: Lower chest: Tiny hiatal hernia.  No acute abnormality. Hepatobiliary: No focal liver abnormality. Calcified gallstone noted within the gallbladder lumen. Associated marked gallbladder wall thickening or pericholecystic fluid. A 1.6 cm gallstone likely impacted within the gallbladder neck. No biliary dilatation. Pancreas: No focal lesion. Normal pancreatic contour. No surrounding inflammatory changes. No main pancreatic ductal dilatation. Spleen: Normal in size without focal abnormality. Adrenals/Urinary Tract: No adrenal nodule bilaterally. Bilateral kidneys enhance symmetrically.  Partial right nephrectomy. No hydronephrosis. No hydroureter. Left nephrolithiasis measuring to 7 mm. Punctate right nephrolithiasis. No ureterolithiasis bilaterally. Stable in size fluid density lesions within the kidneys likely represent simple renal cysts. Simple renal cysts, in the absence of clinically indicated signs/symptoms, require no  independent follow-up. The urinary bladder is distended with urine with mild trabeculations  noted. On delayed imaging, there is no urothelial wall thickening and there are no filling defects in the opacified portions of the bilateral collecting systems or ureters. Stomach/Bowel: Incidentally noted possible thinning of the gastric fundal wall (3:6). The stomach is otherwise within normal limits. No evidence of small bowel wall thickening or dilatation. Colonic diverticulosis. Slightly irregular bowel wall thickening of the hepatic flexure along the a focal diverticula (8:53). Mild bowel wall thickening of the ascending colon. Right appendix appears normal. Vascular/Lymphatic: No abdominal aorta or iliac aneurysm. Severe atherosclerotic plaque of the aorta and its branches. No abdominal, pelvic, or inguinal lymphadenopathy. Reproductive: Prostate is prominent in size. Other: Right upper quadrant mesenteric fat stranding and trace free fluid. No intraperitoneal free gas. No organized fluid collection. Musculoskeletal: No abdominal wall hernia or abnormality. No suspicious lytic or blastic osseous lesions. No acute displaced fracture. Multilevel degenerative changes of the spine. Intervertebral disc space vacuum phenomenon at the L3-L4 and L4-L5 levels. IMPRESSION: 1. Cholelithiasis with acute cholecystitis. Associated 1.6 cm gallstone likely impacted within the gallbladder neck. No CT evidence of choledocholithiasis. Recommend surgical consultation. 2. Bowel thickening of the hepatic flexure and mild bowel wall thickening of the ascending colon in the setting of colonic diverticulosis. Finding likely reactive in etiology. Given slight focal irregular bowel thickening along the colonic diverticula, differential diagnosis includes much less likely concurrent acute diverticulitis. Consider colonoscopy status post treatment and status post complete resolution of inflammatory changes to exclude an underlying lesion. 3.  Incidentally noted possible thinning of the gastric fundal wall. Limited evaluation due to under distension of the stomach. Recommend correlation with signs and symptoms of gastric ulceration. Consider outpatient endoscopy for direct visualization if clinically indicated. 4. Nonobstructive bilateral nephrolithiasis measuring up to 7 mm on the left and punctate on the right. 5. Status post partial right nephrectomy. 6. Prostate is prominent in size with associated urinary bladder distension with urine and mild urinary bladder wall trabeculations. Correlate with signs and symptoms of obstructive uropathy. 7.  Aortic Atherosclerosis (ICD10-I70.0)-severe. Electronically Signed   By: Tish Frederickson M.D.   On: 04/14/2023 20:12   DG Chest Portable 1 View  Result Date: 04/14/2023 CLINICAL DATA:  Fall. EXAM: PORTABLE CHEST 1 VIEW COMPARISON:  Chest radiograph dated March 18, 2022. CT chest dated May 28, 2022. FINDINGS: Patient is rotated to the right. The heart size and mediastinal contours are otherwise within normal limits. Aortic atherosclerosis. Coarse interstitial markings. No focal consolidation, sizeable pleural effusion, or pneumothorax. Remote bilateral healed rib fractures. No acute osseous abnormality. IMPRESSION: 1. No acute findings in the chest. 2. Emphysema. Electronically Signed   By: Hart Robinsons M.D.   On: 04/14/2023 17:18    Assessment/Plan 67 y/o M w/ a hx of GERD, HTN, HLD, CVA, Afib on Eliquis, diverticulitis s/p Hartmann's followed by reversal and right renal mass s/p RP robotic right partial nephrectomy who presents with 5 days of abdominal pain and general malaise with imaging and exam c/f acute cholecystitis  - Admit to medicine - Hold Eliquis - Continue abx - No indication for urgent surgical intervention. Rounding team will evaluate and discus possible interventions in the morning - NPO at MN - Trend LFTs  I reviewed last 24 h vitals and pain scores, last 24 h labs and  trends, and last 24 h imaging results.  T[facility] 04/14/2023, 9:03 PM Please see Amion for pager number during day hours 7:00am-4:30pm or 7:00am -11:30am on weekends

## 2023-04-15 DIAGNOSIS — K810 Acute cholecystitis: Secondary | ICD-10-CM | POA: Diagnosis not present

## 2023-04-15 LAB — HEPATIC FUNCTION PANEL
ALT: 23 U/L (ref 0–44)
AST: 51 U/L — ABNORMAL HIGH (ref 15–41)
Albumin: 2.2 g/dL — ABNORMAL LOW (ref 3.5–5.0)
Alkaline Phosphatase: 55 U/L (ref 38–126)
Bilirubin, Direct: 0.7 mg/dL — ABNORMAL HIGH (ref 0.0–0.2)
Indirect Bilirubin: 0.6 mg/dL (ref 0.3–0.9)
Total Bilirubin: 1.3 mg/dL — ABNORMAL HIGH (ref <1.2)
Total Protein: 5.3 g/dL — ABNORMAL LOW (ref 6.5–8.1)

## 2023-04-15 LAB — GLUCOSE, CAPILLARY
Glucose-Capillary: 103 mg/dL — ABNORMAL HIGH (ref 70–99)
Glucose-Capillary: 99 mg/dL (ref 70–99)
Glucose-Capillary: 49 mg/dL — ABNORMAL LOW (ref 70–99)
Glucose-Capillary: 59 mg/dL — ABNORMAL LOW (ref 70–99)
Glucose-Capillary: 111 mg/dL — ABNORMAL HIGH (ref 70–99)
Glucose-Capillary: 69 mg/dL — ABNORMAL LOW (ref 70–99)
Glucose-Capillary: 213 mg/dL — ABNORMAL HIGH (ref 70–99)
Glucose-Capillary: 187 mg/dL — ABNORMAL HIGH (ref 70–99)

## 2023-04-15 LAB — BASIC METABOLIC PANEL
Anion gap: 10 (ref 5–15)
BUN: 31 mg/dL — ABNORMAL HIGH (ref 8–23)
CO2: 21 mmol/L — ABNORMAL LOW (ref 22–32)
Calcium: 9.3 mg/dL (ref 8.9–10.3)
Chloride: 97 mmol/L — ABNORMAL LOW (ref 98–111)
Creatinine, Ser: 2.1 mg/dL — ABNORMAL HIGH (ref 0.61–1.24)
GFR, Estimated: 34 mL/min — ABNORMAL LOW (ref >60)
Glucose, Bld: 94 mg/dL (ref 70–99)
Potassium: 3.4 mmol/L — ABNORMAL LOW (ref 3.5–5.1)
Sodium: 128 mmol/L — ABNORMAL LOW (ref 135–145)

## 2023-04-15 LAB — HEMOGLOBIN A1C
Hgb A1c MFr Bld: 7.4 % — ABNORMAL HIGH (ref 4.8–5.6)
Mean Plasma Glucose: 165.68 mg/dL

## 2023-04-15 LAB — CBC
HCT: 34.5 % — ABNORMAL LOW (ref 39.0–52.0)
Hemoglobin: 12.2 g/dL — ABNORMAL LOW (ref 13.0–17.0)
MCH: 32.7 pg (ref 26.0–34.0)
MCHC: 35.4 g/dL (ref 30.0–36.0)
MCV: 92.5 fL (ref 80.0–100.0)
Platelets: 102 10*3/uL — ABNORMAL LOW (ref 150–400)
RBC: 3.73 MIL/uL — ABNORMAL LOW (ref 4.22–5.81)
RDW: 14.6 % (ref 11.5–15.5)
WBC: 10 10*3/uL (ref 4.0–10.5)
nRBC: 0 % (ref 0.0–0.2)

## 2023-04-15 LAB — PHOSPHORUS: Phosphorus: 3.6 mg/dL (ref 2.5–4.6)

## 2023-04-15 LAB — PROTIME-INR
INR: 1.6 — ABNORMAL HIGH (ref 0.8–1.2)
Prothrombin Time: 19.2 s — ABNORMAL HIGH (ref 11.4–15.2)

## 2023-04-15 LAB — HIV ANTIBODY (ROUTINE TESTING W REFLEX): HIV Screen 4th Generation wRfx: NONREACTIVE

## 2023-04-15 LAB — MAGNESIUM: Magnesium: 1.6 mg/dL — ABNORMAL LOW (ref 1.7–2.4)

## 2023-04-15 LAB — OSMOLALITY: Osmolality: 282 mosm/kg (ref 275–295)

## 2023-04-15 MED ORDER — TAMSULOSIN HCL 0.4 MG PO CAPS
0.4000 mg | ORAL_CAPSULE | Freq: Every day | ORAL | Status: DC
  Administered 2023-04-15 – 2023-04-23 (×9): 0.4 mg via ORAL
  Filled 2023-04-15 (×10): qty 1

## 2023-04-15 MED ORDER — ACETAMINOPHEN 325 MG PO TABS
650.0000 mg | ORAL_TABLET | Freq: Four times a day (QID) | ORAL | Status: DC | PRN
  Administered 2023-04-15: 650 mg via ORAL
  Filled 2023-04-15 (×2): qty 2

## 2023-04-15 MED ORDER — MELATONIN 5 MG PO TABS
5.0000 mg | ORAL_TABLET | Freq: Every evening | ORAL | Status: DC | PRN
  Administered 2023-04-16 – 2023-04-20 (×4): 5 mg via ORAL
  Filled 2023-04-15 (×4): qty 1

## 2023-04-15 MED ORDER — SODIUM CHLORIDE 0.9 % IV SOLN: INTRAVENOUS | Status: AC

## 2023-04-15 MED ORDER — METOPROLOL SUCCINATE ER 50 MG PO TB24: 100.0000 mg | ORAL_TABLET | Freq: Every day | ORAL | Status: DC

## 2023-04-15 MED ORDER — HYDROCHLOROTHIAZIDE 12.5 MG PO TABS: 12.5000 mg | ORAL_TABLET | Freq: Every day | ORAL | Status: DC

## 2023-04-15 MED ORDER — DILTIAZEM HCL ER COATED BEADS 240 MG PO CP24: 240.0000 mg | ORAL_CAPSULE | Freq: Every day | ORAL | Status: DC

## 2023-04-15 MED ORDER — PROCHLORPERAZINE EDISYLATE 10 MG/2ML IJ SOLN: 5.0000 mg | Freq: Four times a day (QID) | INTRAMUSCULAR | Status: DC | PRN

## 2023-04-15 MED ORDER — MAGNESIUM SULFATE 2 GM/50ML IV SOLN
2.0000 g | Freq: Once | INTRAVENOUS | Status: AC
  Administered 2023-04-15: 2 g via INTRAVENOUS
  Filled 2023-04-15: qty 50

## 2023-04-15 MED ORDER — GABAPENTIN 300 MG PO CAPS: 600.0000 mg | ORAL_CAPSULE | Freq: Three times a day (TID) | ORAL | Status: DC

## 2023-04-15 MED ORDER — POLYETHYLENE GLYCOL 3350 17 G PO PACK: 17.0000 g | PACK | Freq: Every day | ORAL | Status: DC | PRN

## 2023-04-15 MED ORDER — GABAPENTIN 300 MG PO CAPS
300.0000 mg | ORAL_CAPSULE | Freq: Three times a day (TID) | ORAL | Status: DC
  Administered 2023-04-15 – 2023-04-23 (×25): 300 mg via ORAL
  Filled 2023-04-15 (×25): qty 1

## 2023-04-15 MED ORDER — FINASTERIDE 5 MG PO TABS
5.0000 mg | ORAL_TABLET | Freq: Every day | ORAL | Status: DC
  Administered 2023-04-15 – 2023-04-23 (×9): 5 mg via ORAL
  Filled 2023-04-15 (×9): qty 1

## 2023-04-15 MED ORDER — POTASSIUM CHLORIDE CRYS ER 20 MEQ PO TBCR
40.0000 meq | EXTENDED_RELEASE_TABLET | Freq: Once | ORAL | Status: AC
  Administered 2023-04-15: 40 meq via ORAL
  Filled 2023-04-15: qty 2

## 2023-04-15 MED ORDER — LISINOPRIL 20 MG PO TABS: 20.0000 mg | ORAL_TABLET | Freq: Every day | ORAL | Status: DC

## 2023-04-15 NOTE — Progress Notes (Signed)
Patient arrived to the room and ambulated to the bed. Patient is alert and oriented x4, reported no pain. VS stable with HR 103. Patient was placed on tele monitor, tele unit was called.  Patient have his wallet, phone and charger, clothing, and home meds with him. Patient refused to have his meds sent to the pharmacy.His home meds-rosuvastatin, insulin glargine pen, and eye drop are kept at bedside. Patient report that he will get someone to bring them home.  Patient was educated not to take any of his home meds while he is in the hospital and that his insulin dosage and BS check will be covered while he in the hospital. Patient verbalized understanding. Patient was oriented to the room, call bell and staffs. Bed alarm initiated, call bell within reach.    Kenndra Morris

## 2023-04-15 NOTE — Consult Note (Addendum)
Chief Complaint: Patient was seen in consultation today for percutaneous cholecystostomy drain placement Chief Complaint  Patient presents with   Abdominal Pain   at the request of Dr Darnelle Spangle   Supervising Physician: Roanna Banning  Patient Status: [facility] - In-pt  History of Present Illness: NIHIT HOYING is a 67 y.o. male   FULL Code status per pt GERD; HTN; HLD;  Afib (Eliquis) Diverticulitis (Hartmanns pouch) Partial right nephrectomy - mass 5 day abd pain; loss of appetite Leukocytosis on admission 11/18  CT yesterday:    IMPRESSION: 1. Cholelithiasis with acute cholecystitis. Associated 1.6 cm gallstone likely impacted within the gallbladder neck. No CT evidence of choledocholithiasis. Recommend surgical consultation. 2. Bowel thickening of the hepatic flexure and mild bowel wall thickening of the ascending colon in the setting of colonic diverticulosis. Finding likely reactive in etiology. Given slight focal irregular bowel thickening along the colonic diverticula, differential diagnosis includes much less likely concurrent acute diverticulitis. Consider colonoscopy status post treatment and status post complete resolution of inflammatory changes to exclude an underlying lesion. 3. Incidentally noted possible thinning of the gastric fundal wall. Limited evaluation due to under distension of the stomach. Recommend correlation with signs and symptoms of gastric ulceration. Consider outpatient endoscopy for direct visualization if clinically indicated.  Dr Dwain Sarna note today:  Cholecystitis -five days of symptoms with a very inflamed looking gb on ct with thick wall on [place].  Tb 2.9 so likely Mirizzis as well I think. I think best option is a perc chole now for him given all of that and discuss lap chole in around 6 weeks    Past Medical History:  Diagnosis Date   Alcohol abuse    Diabetes mellitus without complication (HCC)    Diverticulosis    GERD  (gastroesophageal reflux disease)    Hyperlipidemia    Hypertension    Left atrial enlargement    Neuromuscular disorder (HCC)    neuropathy   Persistent atrial fibrillation (HCC)    CHA2DS2-VASc score 4 (age-15, HTN-1, DM 2-1, aortic plaque)   Stroke (HCC) 04/2021    Past Surgical History:  Procedure Laterality Date   APPENDECTOMY     BALLOON DILATION N/A 10/03/2021   Procedure: BALLOON DILATION;  Surgeon: Sherrilyn Rist, MD;  Location: [facility] ENDOSCOPY;  Service: Gastroenterology;  Laterality: N/A;   BIOPSY  10/03/2021   Procedure: BIOPSY;  Surgeon: Sherrilyn Rist, MD;  Location: [facility] ENDOSCOPY;  Service: Gastroenterology;;   CARDIAC CATHETERIZATION N/A 01/05/2015   Procedure: Left Heart Cath and Coronary Angiography;  Surgeon: Chrystie Nose, MD;  Location: [facility] INVASIVE CV LAB;  Service: Cardiovascular;; (For Abnormal Nuclear Stress Test) mild luminal irregularities of the LCx with mild areas of ectasia.  Calcified ostial D1 mild stenosis. ->  Mild/nonobstructive CAD.   CARDIOVERSION N/A 11/01/2014   Procedure: CARDIOVERSION;  Surgeon: Chrystie Nose, MD;  Location: [facility] ENDOSCOPY;  Service: Cardiovascular;  Laterality: N/A;   CARDIOVERSION N/A 02/24/2015   Procedure: CARDIOVERSION;  Surgeon: Chrystie Nose, MD;  Location: [facility] ENDOSCOPY;  Service: Cardiovascular;  Laterality: N/A;   CATARACT EXTRACTION Bilateral    COLON SURGERY N/A 2006   14 inches removed from diverticitis perforation   COLONOSCOPY     Per stoma   ESOPHAGOGASTRODUODENOSCOPY (EGD) WITH PROPOFOL N/A 10/03/2021   Procedure: ESOPHAGOGASTRODUODENOSCOPY (EGD) WITH PROPOFOL;  Surgeon: Sherrilyn Rist, MD;  Location: [facility] ENDOSCOPY;  Service: Gastroenterology;  Laterality: N/A;   EYE SURGERY Bilateral    Cat  Sx   INNER EAR SURGERY     as a child   NM MYOVIEW LTD  12/08/2014   FALSE POSITIVE: Read asINTERMEDIATE RISK.  Partially reversible medium size moderate severity defect in the basal--mid-apical  inferoseptal-inferior and apical walls.  Not gated due to A. fib. ->  Referred for cath- NON-OBSTRUCTIVE CAD   ROBOTIC ASSITED PARTIAL NEPHRECTOMY Right 05/23/2021   Procedure: XI RETROPERITONEAL ROBOTIC ASSITED PARTIAL NEPHRECTOMY;  Surgeon: Sebastian Ache, MD;  Location: [facility] ORS;  Service: Urology;  Laterality: Right;   TEE WITHOUT CARDIOVERSION N/A 11/01/2014   Procedure: TRANSESOPHAGEAL ECHOCARDIOGRAM (TEE) -unsuccesful DCCV;  Surgeon: Chrystie Nose, MD;  Location: [facility] ENDOSCOPY;  Service: Cardiovascular;; Mild concentric LVH.  EF 55 to 60%.  Normal wall motion.  Grade 3 aortic atheroma.  No source of cardiac emboli.  -->  Followed by unsuccessful DCCV.    Allergies: Patient has no known allergies.  Medications: Prior to Admission medications   Medication Sig Start Date End Date Taking? Authorizing Provider  Cyanocobalamin (VITAMIN B-12 PO) Take 1 tablet by mouth at bedtime.   Yes [provider]  cyclobenzaprine (FLEXERIL) 10 MG tablet Take 10 mg by mouth every 8 (eight) hours as needed for muscle spasms. 08/29/22  Yes [provider]  diltiazem (CARDIZEM CD) 240 MG 24 hr capsule Take 240 mg by mouth daily. 03/28/21  Yes [provider]  fenofibrate 160 MG tablet Take 160 mg by mouth daily.   Yes [provider]  gabapentin (NEURONTIN) 300 MG capsule Take 300 mg by mouth See admin instructions. Take 600mg  (2 capsules) by mouth every morning, and 300mg  (1 capsule) in the evening. 04/02/21  Yes [provider]  JARDIANCE 10 MG TABS tablet Take 10 mg by mouth daily. 04/25/22  Yes [provider]  Lactobacillus-Inulin (PROBIOTIC DIGESTIVE SUPPORT PO) Take 1 tablet by mouth daily.   Yes [provider]  lisinopril-hydrochlorothiazide (ZESTORETIC) 20-12.5 MG tablet Take 1 tablet by mouth daily.   Yes [provider]  metFORMIN (GLUCOPHAGE) 1000 MG tablet Take 1,000 mg by mouth 2 (two) times daily. 09/09/19  Yes [provider]  metoprolol succinate (TOPROL-XL) 100 MG 24 hr tablet Take 100 mg by mouth daily. 09/21/14  Yes [provider]  Omega-3 Fatty Acids (FISH OIL) 1000 MG CAPS Take 1,200 mg by mouth daily.   Yes [provider]  pantoprazole (PROTONIX) 40 MG tablet Take 1 tablet (40 mg total) by mouth daily before breakfast. 09/30/22  Yes Danis, Starr [place] III, MD  rosuvastatin (CRESTOR) 40 MG tablet Take 40 mg by mouth daily. 10/01/21  Yes [provider]  sildenafil (VIAGRA) 100 MG tablet Take 100 mg by mouth daily as needed. 08/26/21  Yes [provider]  TOUJEO SOLOSTAR 300 UNIT/ML Solostar Pen Inject 10 Units into the skin daily.   Yes [provider]  Wheat Dextrin (BENEFIBER DRINK MIX) PACK Take 1 Package by mouth daily.   Yes [provider]  XDEMVY 0.25 % SOLN Place 1 drop into both eyes daily. 03/25/23  Yes [provider]     Family History  Problem Relation Age of Onset   Heart disease Father    Stroke Father    Diabetes Mother    Diabetes Maternal Grandmother    Stroke Maternal Grandmother    Heart attack Paternal Grandfather    Hodgkin's lymphoma Paternal Aunt    Hodgkin's lymphoma Paternal Uncle    Colon cancer Neg Hx    Esophageal cancer  Neg Hx    Pancreatic cancer Neg Hx    Prostate cancer Neg Hx    Rectal cancer Neg Hx    Stomach cancer Neg Hx     Social History   Socioeconomic History   Marital status: Single    Spouse name: Not on file   Number of children: 2   Years of education: Not on file   Highest education level: Not on file  Occupational History   Occupation: Pharmacist, hospital  Tobacco Use   Smoking status: Former    Current packs/day: 0.00    Average packs/day: 1 pack/day for 30.0 years (30.0 ttl pk-yrs)    Types: Cigarettes    Start date: 03/05/1984    Quit date: 03/05/2014    Years since quitting: 9.1   Smokeless tobacco: Never  Vaping Use   Vaping status: Never Used  Substance and Sexual  Activity   Alcohol use: Yes    Alcohol/week: 14.0 standard drinks of alcohol    Types: 14 Shots of liquor per week   Drug use: No   Sexual activity: Not on file  Other Topics Concern   Not on file  Social History Narrative   Works in the Engineer, technical sales   Right handed   Caffeine- 1 cup per day    Social Determinants of Health   Financial Resource Strain: Not on file  Food Insecurity: No Food Insecurity (04/15/2023)   Hunger Vital Sign    Worried About Running Out of Food in the Last Year: Never true    Ran Out of Food in the Last Year: Never true  Transportation Needs: No Transportation Needs (04/15/2023)   PRAPARE - Administrator, Civil Service (Medical): No    Lack of Transportation (Non-Medical): No  Physical Activity: Not on file  Stress: Not on file  Social Connections: Not on file    Review of Systems: A 12 point ROS discussed and pertinent positives are indicated in the HPI above.  All other systems are negative.  Review of Systems  Constitutional:  Positive for activity change and appetite change. Negative for fever.  Respiratory:  Negative for cough and shortness of breath.   Cardiovascular:  Negative for chest pain.  Gastrointestinal:  Positive for abdominal pain and nausea.  Neurological:  Positive for weakness.  Psychiatric/Behavioral:  Negative for behavioral problems and confusion.     Vital Signs: BP (!) 97/59 (BP Location: Right Arm)   Pulse 93   Temp 97.9 F (36.6 C)   Resp 18   Ht 5\' 10"  (1.778 m)   Wt 170 lb 3.2 oz (77.2 kg)   SpO2 95%   BMI 24.42 kg/m   Advance Care Plan: The advanced care plan/surrogate decision maker was discussed at the time of visit and documented in the medical record.    Physical Exam Vitals reviewed.  Cardiovascular:     Rate and Rhythm: Normal rate and regular rhythm.  Pulmonary:     Breath sounds: Normal breath sounds.  Abdominal:     Palpations: Abdomen is soft.     Tenderness: There  is abdominal tenderness.  Skin:    General: Skin is warm.  Neurological:     Mental Status: He is alert and oriented to person, place, and time.  Psychiatric:        Behavior: Behavior normal.     Imaging: US Abdomen Limited RUQ (LIVER/GB)  Result Date: 04/14/2023 CLINICAL DATA:  Abdominal pain EXAM: ULTRASOUND ABDOMEN LIMITED RIGHT UPPER  QUADRANT COMPARISON:  CT today. FINDINGS: Gallbladder: There is marked gallbladder wall thickening measuring up to 9 mm with pericholecystic fluid and tenderness over the gallbladder during the study compatible with acute cholecystitis. Sludge and stones within the gallbladder. Common bile duct: Diameter: Normal caliber, 5 mm Liver: No focal lesion identified. Within normal limits in parenchymal echogenicity. Portal vein is patent on color Doppler imaging with normal direction of blood flow towards the liver. Other: None. IMPRESSION: Cholelithiasis with evidence of acute cholecystitis. Electronically Signed   By: Charlett Nose M.D.   On: 04/14/2023 22:19   CT ABDOMEN PELVIS W CONTRAST  Result Date: 04/14/2023 CLINICAL DATA:  LLQ abdominal pain abdominal pain, ?dtic. History of renal cell carcinoma. EXAM: CT ABDOMEN AND PELVIS WITH CONTRAST TECHNIQUE: Multidetector CT imaging of the abdomen and pelvis was performed using the standard protocol following bolus administration of intravenous contrast. RADIATION DOSE REDUCTION: This exam was performed according to the departmental dose-optimization program which includes automated exposure control, adjustment of the mA and/or kV according to patient size and/or use of iterative reconstruction technique. CONTRAST:  60mL OMNIPAQUE IOHEXOL 350 MG/ML SOLN COMPARISON:  CT abdomen pelvis 09/30/2004, ultrasound abdomen 10/02/2021, MRI abdomen 05/22/2020, CT abdomen 03/21/2021 FINDINGS: Lower chest: Tiny hiatal hernia.  No acute abnormality. Hepatobiliary: No focal liver abnormality. Calcified gallstone noted within the  gallbladder lumen. Associated marked gallbladder wall thickening or pericholecystic fluid. A 1.6 cm gallstone likely impacted within the gallbladder neck. No biliary dilatation. Pancreas: No focal lesion. Normal pancreatic contour. No surrounding inflammatory changes. No main pancreatic ductal dilatation. Spleen: Normal in size without focal abnormality. Adrenals/Urinary Tract: No adrenal nodule bilaterally. Bilateral kidneys enhance symmetrically.  Partial right nephrectomy. No hydronephrosis. No hydroureter. Left nephrolithiasis measuring to 7 mm. Punctate right nephrolithiasis. No ureterolithiasis bilaterally. Stable in size fluid density lesions within the kidneys likely represent simple renal cysts. Simple renal cysts, in the absence of clinically indicated signs/symptoms, require no independent follow-up. The urinary bladder is distended with urine with mild trabeculations noted. On delayed imaging, there is no urothelial wall thickening and there are no filling defects in the opacified portions of the bilateral collecting systems or ureters. Stomach/Bowel: Incidentally noted possible thinning of the gastric fundal wall (3:6). The stomach is otherwise within normal limits. No evidence of small bowel wall thickening or dilatation. Colonic diverticulosis. Slightly irregular bowel wall thickening of the hepatic flexure along the a focal diverticula (8:53). Mild bowel wall thickening of the ascending colon. Right appendix appears normal. Vascular/Lymphatic: No abdominal aorta or iliac aneurysm. Severe atherosclerotic plaque of the aorta and its branches. No abdominal, pelvic, or inguinal lymphadenopathy. Reproductive: Prostate is prominent in size. Other: Right upper quadrant mesenteric fat stranding and trace free fluid. No intraperitoneal free gas. No organized fluid collection. Musculoskeletal: No abdominal wall hernia or abnormality. No suspicious lytic or blastic osseous lesions. No acute displaced fracture.  Multilevel degenerative changes of the spine. Intervertebral disc space vacuum phenomenon at the L3-L4 and L4-L5 levels. IMPRESSION: 1. Cholelithiasis with acute cholecystitis. Associated 1.6 cm gallstone likely impacted within the gallbladder neck. No CT evidence of choledocholithiasis. Recommend surgical consultation. 2. Bowel thickening of the hepatic flexure and mild bowel wall thickening of the ascending colon in the setting of colonic diverticulosis. Finding likely reactive in etiology. Given slight focal irregular bowel thickening along the colonic diverticula, differential diagnosis includes much less likely concurrent acute diverticulitis. Consider colonoscopy status post treatment and status post complete resolution of inflammatory changes to exclude an underlying lesion. 3. Incidentally noted  possible thinning of the gastric fundal wall. Limited evaluation due to under distension of the stomach. Recommend correlation with signs and symptoms of gastric ulceration. Consider outpatient endoscopy for direct visualization if clinically indicated. 4. Nonobstructive bilateral nephrolithiasis measuring up to 7 mm on the left and punctate on the right. 5. Status post partial right nephrectomy. 6. Prostate is prominent in size with associated urinary bladder distension with urine and mild urinary bladder wall trabeculations. Correlate with signs and symptoms of obstructive uropathy. 7.  Aortic Atherosclerosis (ICD10-I70.0)-severe. Electronically Signed   By: Tish Frederickson M.D.   On: 04/14/2023 20:12   DG Chest Portable 1 View  Result Date: 04/14/2023 CLINICAL DATA:  Fall. EXAM: PORTABLE CHEST 1 VIEW COMPARISON:  Chest radiograph dated March 18, 2022. CT chest dated May 28, 2022. FINDINGS: Patient is rotated to the right. The heart size and mediastinal contours are otherwise within normal limits. Aortic atherosclerosis. Coarse interstitial markings. No focal consolidation, sizeable pleural effusion, or  pneumothorax. Remote bilateral healed rib fractures. No acute osseous abnormality. IMPRESSION: 1. No acute findings in the chest. 2. Emphysema. Electronically Signed   By: Hart Robinsons M.D.   On: 04/14/2023 17:18    Labs:  CBC: Recent Labs    04/14/23 1423 04/15/23 0654  WBC 14.7* 10.0  HGB 13.5 12.2*  HCT 40.0 34.5*  PLT 115* 102*    COAGS: No results for input(s): "INR", "APTT" in the last 8760 hours.  BMP: Recent Labs    04/14/23 1535 04/15/23 0654  NA 128* 128*  K 3.5 3.4*  CL 97* 97*  CO2 18* 21*  GLUCOSE 70 94  BUN 31* 31*  CALCIUM 8.4* 9.3  CREATININE 2.04* 2.10*  GFRNONAA 35* 34*    LIVER FUNCTION TESTS: Recent Labs    04/14/23 1535  BILITOT 2.9*  AST 35  ALT 20  ALKPHOS 39  PROT 4.7*  ALBUMIN 2.2*    TUMOR MARKERS: No results for input(s): "AFPTM", "CEA", "CA199", "CHROMGRNA" in the last 8760 hours.  Assessment and Plan:  Scheduled for percutaneous cholecystostomy drain placement asap (Verifying Eliquis dosing) Per chart LD Monday 1 pm--- will plan IR procedure for 11/21 Discussed with Dr Dwain Sarna Risks and benefits discussed with the patient including, but not limited to bleeding, infection, gallbladder perforation, bile leak, sepsis or even death.  All of the patient's questions were answered, patient is agreeable to proceed. Consent signed and in chart.   Thank you for this interesting consult.  I greatly enjoyed meeting PALASH DOUSE and look forward to participating in their care.  A copy of this report was sent to the requesting provider on this date.  Electronically Signed: Robet Leu, PA-C 04/15/2023, 10:16 AM   I spent a total of 20 Minutes    in face to face in clinical consultation, greater than 50% of which was counseling/coordinating care for perc chole drain

## 2023-04-15 NOTE — Progress Notes (Signed)
Patient have a temp of 100.4. [name], DO was notified.  Angel Shelton

## 2023-04-15 NOTE — Progress Notes (Addendum)
[facility] Progress Note  Patient: Angel Shelton    AVW:[id number]  DOA: 04/14/2023     Date of Service: the patient was seen and examined on 04/15/2023  Chief Complaint  Patient presents with   Abdominal Pain   Brief hospital course: NEY TRIPODI is a 67 y.o. male with medical history significant of HTN, atrial fibrillation on Eliquis, CVA, type 2 diabetes, history of renal mass s/p right partial nephrectomy who presents with abdominal pain.   Patient reports left lower quadrant abdominal pain that has radiated to his right abdomen since last week.  Has been weak and mostly been staying in bed.  Had decreased oral intake.  However has still been compliant with his medication including his antihypertensives.  He denies any nausea, vomiting or diarrhea.  Patient is on Eliquis for atrial fibrillation and took last dose today at 1 PM.   On arrival to ED, he was afebrile, BP of 70/58 on room air.   CBC with leukocytosis of 14.7, hemoglobin of 13.5, thrombocytopenia of 115.  Lactate initially elevated at 2.2   CMP: Hyponatremia Na 128, Cl 97, CO2 18 with normal anion gap 13 although trending up from prior.   Creatinine elevated at 2.04 with unclear baseline. LFTs wnl but with hyperbilirubinemia 2.9.  Lipase wnl   CT abdomen chest with calcified gallstone noted within gallbladder lumen and another likely impacted gallstone within the gallbladder neck.  Findings concerning for acute cholecystitis.  There is also nonobstructive bilateral nephrolithiasis. Acute cholecystitis was confirmed on right upper quadrant ultrasound.   In the ED s/p 3 L NS fluid bolus given, patient was started on ceftriaxone and Flagyl.  General surgery was consulted, recommended lap chole in 6 weeks, IR consulted for percutaneous cholecystostomy tube insertion which will be done after 48 hours of Eliquis washout.  Keep n.p.o. after midnight on 03/2123 as prior Continue antibiotics, further management  as below.   Assessment and Plan:  Severe sepsis due to acute cholecystitis, POA SIRS Criteria RR 22, WBC 14.7,  Organ dysfunction: lactic acid 2.2, BP 70/58, total bilirubin 2.9 S/p IV fluid given in the ED and started on antibiotics. Continue IV fluid for hydration, monitor vitals closely. Held metoprolol, resume, lisinopril and hydrochlorothiazide due to low blood pressure   # Acute cholecystitis - CT imaging with calcified gallstone noted within gallbladder lumen and another likely impacted gallstone within the gallbladder neck.   -General surgery consulted. Pt last took Eliquis at 1pm on 11/18, need to wait for 48hr for washout as per IR Cholecystostomy tube insertion will be done on 11/21, keep n.p.o. after midnight General Surgery consulted, recommended lap chole in 6 weeks Continue antibiotics ceftriaxone and Flagyl.  AKI due to sepsis, hypotension, and urinary retention due to BPH Baseline creatinine 1.23-1.39 Cr 2.04 on admission Continue IV fluid for hydration  Urinary retention, Foley catheter was inserted, Foley for 1 week and recommend to follow with urology as an outpatient for voiding trial Started Flomax and Proscar  Thrombocytopenia -likely due to sepsis, continue to monitor CBC daily   Isotonic hyponatremia -due to dehydration Serum osmolality 282 WNL -Received 3 L of normal saline bolus Monitor sodium level daily    History of CVA (cerebrovascular accident) -has already taken dose of aspirin on 11/18. Held due to pending surgical intervention. Could not find aspirin in the home medication list? Continue home dose Crestor   Atrial fibrillation, permanent  - Hold diltiazem and metoprolol for now, due to hypotension  Resume  medications once blood pressure is stable. Held Eliquis for surgical intervention, resume when cleared by IR and general surgery   Hypertension Hypotension due to sepsis Hold metoprolol, lisinopril and hydrochlorothiazide due to  low blood pressure Monitor BP and titrate medications accordingly.  Dyslipidemia associated with type 2 diabetes mellitus Continue statin and fish oil   DM2 (diabetes mellitus, type 2)  - A1c 7.5, -Holding Jardiance due to hypotension Hold metformin for now -on 10 unit of Toujeo at home -place on SSI   Hypokalemia, potassium repleted cautiously due to AKI Hypomagnesemia, mag repleted.   Monitor electrolytes and replete as needed.  Abnormal CT of the abdomen - CT a/p with Bowel thickening of the hepatic flexure and mild bowel wall thickening of the ascending colon in the setting of colonic diverticulosis. Finding likely reactive in etiology. Given slight focal irregular bowel thickening along the colonic diverticula, differential diagnosis includes much less likely concurrent acute diverticulitis. Consider colonoscopy status post treatment and status post complete resolution of inflammatory changes to exclude an underlying lesion. -there was also findings of possible thickening of the gastric fundal wall but patient has no signs of symptoms of gastric ulceration Follow-up with GI as an outpatient  Body mass index is 24.42 kg/m.  Interventions:  Diet: Heart healthy diet DVT Prophylaxis: SCD, patient took Eliquis 1 PM on 11/18, awaiting for Eliquis washout for 48 hours  Advance goals of care discussion: Full code  Family Communication: family was not present at bedside, at the time of interview.  The pt provided permission to discuss medical plan with the family. Opportunity was given to ask question and all questions were answered satisfactorily.   Disposition:  Pt is from Home, admitted with acute cholecystitis, awaiting for cholecystostomy tube insertion which is scheduled on 11/21 by IR, which precludes a safe discharge. Discharge to home, when cleared by IR and general surgery most likely DC on 11/22.  Subjective: No significant events overnight, patient is having lower  backache and right upper quadrant abdominal pain, denied any other complaints.  Physical Exam: General: NAD, lying comfortably Appear in no distress, affect appropriate Eyes: PERRLA ENT: Oral Mucosa Clear, moist  Neck: no JVD,  Cardiovascular: S1 and S2 Present, no Murmur,  Respiratory: good respiratory effort, Bilateral Air entry equal and Decreased, no Crackles, no wheezes Abdomen: Bowel Sound present, Soft and RUQ tenderness,  Skin: no rashes Extremities: no Pedal edema, no calf tenderness Neurologic: without any new focal findings Gait not checked due to patient safety concerns  Vitals:   04/15/23 0113 04/15/23 0122 04/15/23 0442 04/15/23 0826  BP: 117/74  107/65 (!) 97/59  Pulse: (!) 117 (!) 103 93 93  Resp: 18 20 18 18   Temp: 99.6 F (37.6 C)  (!) 100.4 F (38 C) 97.9 F (36.6 C)  TempSrc: Oral  Oral   SpO2: 98%  97% 95%  Weight:  77.2 kg    Height:  5\' 10"  (1.778 m)      Intake/Output Summary (Last 24 hours) at 04/15/2023 1313 Last data filed at 04/15/2023 1213 Gross per 24 hour  Intake 3200 ml  Output 1750 ml  Net 1450 ml   Filed Weights   04/15/23 0122  Weight: 77.2 kg    Data Reviewed: I have personally reviewed and interpreted daily labs, tele strips, imagings as discussed above. I reviewed all nursing notes, pharmacy notes, vitals, pertinent old records I have discussed plan of care as described above with RN and patient/family.  CBC: Recent Labs  Lab 04/14/23 1423 04/15/23 0654  WBC 14.7* 10.0  HGB 13.5 12.2*  HCT 40.0 34.5*  MCV 94.1 92.5  PLT 115* 102*   Basic Metabolic Panel: Recent Labs  Lab 04/14/23 1535 04/14/23 2339 04/15/23 0654  NA 128*  --  128*  K 3.5  --  3.4*  CL 97*  --  97*  CO2 18*  --  21*  GLUCOSE 70  --  94  BUN 31*  --  31*  CREATININE 2.04*  --  2.10*  CALCIUM 8.4*  --  9.3  MG  --  1.6*  --   PHOS  --  3.6  --     Studies: US Abdomen Limited RUQ (LIVER/GB)  Result Date: 04/14/2023 CLINICAL DATA:   Abdominal pain EXAM: ULTRASOUND ABDOMEN LIMITED RIGHT UPPER QUADRANT COMPARISON:  CT today. FINDINGS: Gallbladder: There is marked gallbladder wall thickening measuring up to 9 mm with pericholecystic fluid and tenderness over the gallbladder during the study compatible with acute cholecystitis. Sludge and stones within the gallbladder. Common bile duct: Diameter: Normal caliber, 5 mm Liver: No focal lesion identified. Within normal limits in parenchymal echogenicity. Portal vein is patent on color Doppler imaging with normal direction of blood flow towards the liver. Other: None. IMPRESSION: Cholelithiasis with evidence of acute cholecystitis. Electronically Signed   By: Charlett Nose M.D.   On: 04/14/2023 22:19   CT ABDOMEN PELVIS W CONTRAST  Result Date: 04/14/2023 CLINICAL DATA:  LLQ abdominal pain abdominal pain, ?dtic. History of renal cell carcinoma. EXAM: CT ABDOMEN AND PELVIS WITH CONTRAST TECHNIQUE: Multidetector CT imaging of the abdomen and pelvis was performed using the standard protocol following bolus administration of intravenous contrast. RADIATION DOSE REDUCTION: This exam was performed according to the departmental dose-optimization program which includes automated exposure control, adjustment of the mA and/or kV according to patient size and/or use of iterative reconstruction technique. CONTRAST:  60mL OMNIPAQUE IOHEXOL 350 MG/ML SOLN COMPARISON:  CT abdomen pelvis 09/30/2004, ultrasound abdomen 10/02/2021, MRI abdomen 05/22/2020, CT abdomen 03/21/2021 FINDINGS: Lower chest: Tiny hiatal hernia.  No acute abnormality. Hepatobiliary: No focal liver abnormality. Calcified gallstone noted within the gallbladder lumen. Associated marked gallbladder wall thickening or pericholecystic fluid. A 1.6 cm gallstone likely impacted within the gallbladder neck. No biliary dilatation. Pancreas: No focal lesion. Normal pancreatic contour. No surrounding inflammatory changes. No main pancreatic ductal  dilatation. Spleen: Normal in size without focal abnormality. Adrenals/Urinary Tract: No adrenal nodule bilaterally. Bilateral kidneys enhance symmetrically.  Partial right nephrectomy. No hydronephrosis. No hydroureter. Left nephrolithiasis measuring to 7 mm. Punctate right nephrolithiasis. No ureterolithiasis bilaterally. Stable in size fluid density lesions within the kidneys likely represent simple renal cysts. Simple renal cysts, in the absence of clinically indicated signs/symptoms, require no independent follow-up. The urinary bladder is distended with urine with mild trabeculations noted. On delayed imaging, there is no urothelial wall thickening and there are no filling defects in the opacified portions of the bilateral collecting systems or ureters. Stomach/Bowel: Incidentally noted possible thinning of the gastric fundal wall (3:6). The stomach is otherwise within normal limits. No evidence of small bowel wall thickening or dilatation. Colonic diverticulosis. Slightly irregular bowel wall thickening of the hepatic flexure along the a focal diverticula (8:53). Mild bowel wall thickening of the ascending colon. Right appendix appears normal. Vascular/Lymphatic: No abdominal aorta or iliac aneurysm. Severe atherosclerotic plaque of the aorta and its branches. No abdominal, pelvic, or inguinal lymphadenopathy. Reproductive: Prostate is prominent in size. Other: Right upper quadrant mesenteric fat stranding  and trace free fluid. No intraperitoneal free gas. No organized fluid collection. Musculoskeletal: No abdominal wall hernia or abnormality. No suspicious lytic or blastic osseous lesions. No acute displaced fracture. Multilevel degenerative changes of the spine. Intervertebral disc space vacuum phenomenon at the L3-L4 and L4-L5 levels. IMPRESSION: 1. Cholelithiasis with acute cholecystitis. Associated 1.6 cm gallstone likely impacted within the gallbladder neck. No CT evidence of choledocholithiasis.  Recommend surgical consultation. 2. Bowel thickening of the hepatic flexure and mild bowel wall thickening of the ascending colon in the setting of colonic diverticulosis. Finding likely reactive in etiology. Given slight focal irregular bowel thickening along the colonic diverticula, differential diagnosis includes much less likely concurrent acute diverticulitis. Consider colonoscopy status post treatment and status post complete resolution of inflammatory changes to exclude an underlying lesion. 3. Incidentally noted possible thinning of the gastric fundal wall. Limited evaluation due to under distension of the stomach. Recommend correlation with signs and symptoms of gastric ulceration. Consider outpatient endoscopy for direct visualization if clinically indicated. 4. Nonobstructive bilateral nephrolithiasis measuring up to 7 mm on the left and punctate on the right. 5. Status post partial right nephrectomy. 6. Prostate is prominent in size with associated urinary bladder distension with urine and mild urinary bladder wall trabeculations. Correlate with signs and symptoms of obstructive uropathy. 7.  Aortic Atherosclerosis (ICD10-I70.0)-severe. Electronically Signed   By: Tish Frederickson M.D.   On: 04/14/2023 20:12   DG Chest Portable 1 View  Result Date: 04/14/2023 CLINICAL DATA:  Fall. EXAM: PORTABLE CHEST 1 VIEW COMPARISON:  Chest radiograph dated March 18, 2022. CT chest dated May 28, 2022. FINDINGS: Patient is rotated to the right. The heart size and mediastinal contours are otherwise within normal limits. Aortic atherosclerosis. Coarse interstitial markings. No focal consolidation, sizeable pleural effusion, or pneumothorax. Remote bilateral healed rib fractures. No acute osseous abnormality. IMPRESSION: 1. No acute findings in the chest. 2. Emphysema. Electronically Signed   By: Hart Robinsons M.D.   On: 04/14/2023 17:18    Scheduled Meds:  [START ON 04/16/2023] diltiazem  240 mg Oral Daily    fenofibrate  160 mg Oral Daily   gabapentin  600 mg Oral Daily   And   gabapentin  300 mg Oral QHS   [START ON 04/16/2023] lisinopril  20 mg Oral Daily   And   [START ON 04/16/2023] hydrochlorothiazide  12.5 mg Oral Daily   insulin aspart  0-9 Units Subcutaneous TID WC   Lotilaner  1 drop Both Eyes BID   [START ON 04/16/2023] metoprolol succinate  100 mg Oral Daily   omega-3 acid ethyl esters  1 g Oral Daily   pantoprazole  40 mg Oral QAC breakfast   rosuvastatin  40 mg Oral Daily   Continuous Infusions:  sodium chloride 75 mL/hr at 04/15/23 0837   cefTRIAXone (ROCEPHIN)  IV     metronidazole 500 mg (04/15/23 0842)   PRN Meds: acetaminophen, melatonin, morphine injection, polyethylene glycol, prochlorperazine  Time spent: 55 minutes  Author: Gillis Santa. MD [facility] 04/15/2023 1:13 PM  To reach On-call, see care teams to locate the attending and reach out to them via [email or web address]. If 7PM-7AM, please contact night-coverage If you still have difficulty reaching the attending provider, please page the [facility] (Director on Call) for [facility] on amion for assistance.

## 2023-04-15 NOTE — Progress Notes (Signed)
Subjective/Chief Complaint: No real complaints, sleepy   Objective: Vital signs in last 24 hours: Temp:  [98 F (36.7 C)-100.4 F (38 C)] 100.4 F (38 C) (11/19 0442) Pulse Rate:  [75-117] 93 (11/19 0442) Resp:  [16-22] 18 (11/19 0442) BP: (70-129)/(58-77) 107/65 (11/19 0442) SpO2:  [94 %-100 %] 97 % (11/19 0442) Weight:  [77.2 kg] 77.2 kg (11/19 0122) Last BM Date : 04/13/23 (per patient)  Intake/Output from previous day: 11/18 0701 - 11/19 0700 In: 3200 [IV Piggyback:3200] Out: -  Intake/Output this shift: No intake/output data recorded.  Ab well healed midline incision, soft, tender ruq   Lab Results:  Recent Labs    04/14/23 1423 04/15/23 0654  WBC 14.7* 10.0  HGB 13.5 12.2*  HCT 40.0 34.5*  PLT 115* 102*   BMET Recent Labs    04/14/23 1535 04/15/23 0654  NA 128* 128*  K 3.5 3.4*  CL 97* 97*  CO2 18* 21*  GLUCOSE 70 94  BUN 31* 31*  CREATININE 2.04* 2.10*  CALCIUM 8.4* 9.3   PT/INR No results for input(s): "LABPROT", "INR" in the last 72 hours. ABG No results for input(s): "PHART", "HCO3" in the last 72 hours.  Invalid input(s): "PCO2", "PO2"  Studies/Results: US Abdomen Limited RUQ (LIVER/GB)  Result Date: 04/14/2023 CLINICAL DATA:  Abdominal pain EXAM: ULTRASOUND ABDOMEN LIMITED RIGHT UPPER QUADRANT COMPARISON:  CT today. FINDINGS: Gallbladder: There is marked gallbladder wall thickening measuring up to 9 mm with pericholecystic fluid and tenderness over the gallbladder during the study compatible with acute cholecystitis. Sludge and stones within the gallbladder. Common bile duct: Diameter: Normal caliber, 5 mm Liver: No focal lesion identified. Within normal limits in parenchymal echogenicity. Portal vein is patent on color Doppler imaging with normal direction of blood flow towards the liver. Other: None. IMPRESSION: Cholelithiasis with evidence of acute cholecystitis. Electronically Signed   By: Charlett Nose M.D.   On: 04/14/2023 22:19    CT ABDOMEN PELVIS W CONTRAST  Result Date: 04/14/2023 CLINICAL DATA:  LLQ abdominal pain abdominal pain, ?dtic. History of renal cell carcinoma. EXAM: CT ABDOMEN AND PELVIS WITH CONTRAST TECHNIQUE: Multidetector CT imaging of the abdomen and pelvis was performed using the standard protocol following bolus administration of intravenous contrast. RADIATION DOSE REDUCTION: This exam was performed according to the departmental dose-optimization program which includes automated exposure control, adjustment of the mA and/or kV according to patient size and/or use of iterative reconstruction technique. CONTRAST:  60mL OMNIPAQUE IOHEXOL 350 MG/ML SOLN COMPARISON:  CT abdomen pelvis 09/30/2004, ultrasound abdomen 10/02/2021, MRI abdomen 05/22/2020, CT abdomen 03/21/2021 FINDINGS: Lower chest: Tiny hiatal hernia.  No acute abnormality. Hepatobiliary: No focal liver abnormality. Calcified gallstone noted within the gallbladder lumen. Associated marked gallbladder wall thickening or pericholecystic fluid. A 1.6 cm gallstone likely impacted within the gallbladder neck. No biliary dilatation. Pancreas: No focal lesion. Normal pancreatic contour. No surrounding inflammatory changes. No main pancreatic ductal dilatation. Spleen: Normal in size without focal abnormality. Adrenals/Urinary Tract: No adrenal nodule bilaterally. Bilateral kidneys enhance symmetrically.  Partial right nephrectomy. No hydronephrosis. No hydroureter. Left nephrolithiasis measuring to 7 mm. Punctate right nephrolithiasis. No ureterolithiasis bilaterally. Stable in size fluid density lesions within the kidneys likely represent simple renal cysts. Simple renal cysts, in the absence of clinically indicated signs/symptoms, require no independent follow-up. The urinary bladder is distended with urine with mild trabeculations noted. On delayed imaging, there is no urothelial wall thickening and there are no filling defects in the opacified portions of the  bilateral  collecting systems or ureters. Stomach/Bowel: Incidentally noted possible thinning of the gastric fundal wall (3:6). The stomach is otherwise within normal limits. No evidence of small bowel wall thickening or dilatation. Colonic diverticulosis. Slightly irregular bowel wall thickening of the hepatic flexure along the a focal diverticula (8:53). Mild bowel wall thickening of the ascending colon. Right appendix appears normal. Vascular/Lymphatic: No abdominal aorta or iliac aneurysm. Severe atherosclerotic plaque of the aorta and its branches. No abdominal, pelvic, or inguinal lymphadenopathy. Reproductive: Prostate is prominent in size. Other: Right upper quadrant mesenteric fat stranding and trace free fluid. No intraperitoneal free gas. No organized fluid collection. Musculoskeletal: No abdominal wall hernia or abnormality. No suspicious lytic or blastic osseous lesions. No acute displaced fracture. Multilevel degenerative changes of the spine. Intervertebral disc space vacuum phenomenon at the L3-L4 and L4-L5 levels. IMPRESSION: 1. Cholelithiasis with acute cholecystitis. Associated 1.6 cm gallstone likely impacted within the gallbladder neck. No CT evidence of choledocholithiasis. Recommend surgical consultation. 2. Bowel thickening of the hepatic flexure and mild bowel wall thickening of the ascending colon in the setting of colonic diverticulosis. Finding likely reactive in etiology. Given slight focal irregular bowel thickening along the colonic diverticula, differential diagnosis includes much less likely concurrent acute diverticulitis. Consider colonoscopy status post treatment and status post complete resolution of inflammatory changes to exclude an underlying lesion. 3. Incidentally noted possible thinning of the gastric fundal wall. Limited evaluation due to under distension of the stomach. Recommend correlation with signs and symptoms of gastric ulceration. Consider outpatient endoscopy for  direct visualization if clinically indicated. 4. Nonobstructive bilateral nephrolithiasis measuring up to 7 mm on the left and punctate on the right. 5. Status post partial right nephrectomy. 6. Prostate is prominent in size with associated urinary bladder distension with urine and mild urinary bladder wall trabeculations. Correlate with signs and symptoms of obstructive uropathy. 7.  Aortic Atherosclerosis (ICD10-I70.0)-severe. Electronically Signed   By: Tish Frederickson M.D.   On: 04/14/2023 20:12   DG Chest Portable 1 View  Result Date: 04/14/2023 CLINICAL DATA:  Fall. EXAM: PORTABLE CHEST 1 VIEW COMPARISON:  Chest radiograph dated March 18, 2022. CT chest dated May 28, 2022. FINDINGS: Patient is rotated to the right. The heart size and mediastinal contours are otherwise within normal limits. Aortic atherosclerosis. Coarse interstitial markings. No focal consolidation, sizeable pleural effusion, or pneumothorax. Remote bilateral healed rib fractures. No acute osseous abnormality. IMPRESSION: 1. No acute findings in the chest. 2. Emphysema. Electronically Signed   By: Hart Robinsons M.D.   On: 04/14/2023 17:18    Anti-infectives: Anti-infectives (From admission, onward)    Start     Dose/Rate Route Frequency Ordered Stop   04/15/23 1800  cefTRIAXone (ROCEPHIN) 2 g in sodium chloride 0.9 % 100 mL IVPB        2 g 200 mL/hr over 30 Minutes Intravenous Every 24 hours 04/14/23 2341     04/15/23 0800  metroNIDAZOLE (FLAGYL) IVPB 500 mg        500 mg 100 mL/hr over 60 Minutes Intravenous 2 times daily 04/14/23 2345     04/14/23 2200  metroNIDAZOLE (FLAGYL) IVPB 500 mg  Status:  Discontinued        500 mg 100 mL/hr over 60 Minutes Intravenous 2 times daily 04/14/23 2341 04/14/23 2345   04/14/23 1645  cefTRIAXone (ROCEPHIN) 2 g in sodium chloride 0.9 % 100 mL IVPB       Placed in "And" Linked Group   2 g 200 mL/hr over 30 Minutes  Intravenous  Once 04/14/23 1630 04/14/23 1805   04/14/23 1645   metroNIDAZOLE (FLAGYL) IVPB 500 mg       Placed in "And" Linked Group   500 mg 100 mL/hr over 60 Minutes Intravenous  Once 04/14/23 1630 04/14/23 1913       Assessment/Plan: Cholecystitis -five days of symptoms with a very inflamed looking gb on ct with thick wall on [place].  Tb 2.9 so likely Mirizzis as well I think. I think best option is a perc chole now for him given all of that and discuss lap chole in around 6 weeks -I wrote for perc chole and discussed plan with patientn -hold eliquis  Emelia Loron 04/15/2023

## 2023-04-15 NOTE — Progress Notes (Signed)
Mobility Specialist Progress Note:   04/15/23 1345  Mobility  Activity Dangled on edge of bed  Level of Assistance Minimal assist, patient does 75% or more  Activity Response Tolerated fair  Mobility Referral Yes  $Mobility charge 1 Mobility  Mobility Specialist Start Time (ACUTE ONLY) 1345  Mobility Specialist Stop Time (ACUTE ONLY) 1400  Mobility Specialist Time Calculation (min) (ACUTE ONLY) 15 min   During Mobility: BP 135/68 (83)  Pt agreeable to mobility session. Required minA to get EOB. Once EOB, pt c/o lightheadedness. Per RN, blood sugar in 40s prior to snack, rechecked and was 68. BP stable. Will return later if schedule permits. Pt back in bed with all needs met, alarm on.   Addison Lank Mobility Specialist Please contact via SecureChat or  Rehab office at [phone]

## 2023-04-15 NOTE — Plan of Care (Signed)
  Problem: Education: Goal: Ability to describe self-care measures that may prevent or decrease complications (Diabetes Survival Skills Education) will improve Outcome: Progressing Goal: Individualized Educational Video(s) Outcome: Progressing   Problem: Coping: Goal: Ability to adjust to condition or change in health will improve Outcome: Progressing   Problem: Fluid Volume: Goal: Ability to maintain a balanced intake and output will improve Outcome: Progressing   Problem: Health Behavior/Discharge Planning: Goal: Ability to identify and utilize available resources and services will improve Outcome: Progressing Goal: Ability to manage health-related needs will improve Outcome: Progressing   Problem: Metabolic: Goal: Ability to maintain appropriate glucose levels will improve Outcome: Progressing   Problem: Nutritional: Goal: Maintenance of adequate nutrition will improve Outcome: Progressing Goal: Progress toward achieving an optimal weight will improve Outcome: Progressing   Problem: Skin Integrity: Goal: Risk for impaired skin integrity will decrease Outcome: Progressing   Problem: Tissue Perfusion: Goal: Adequacy of tissue perfusion will improve Outcome: Progressing   Problem: Education: Goal: Knowledge of General Education information will improve Description: Including pain rating scale, medication(s)/side effects and non-pharmacologic comfort measures Outcome: Progressing   Problem: Health Behavior/Discharge Planning: Goal: Ability to manage health-related needs will improve Outcome: Progressing   Problem: Clinical Measurements: Goal: Ability to maintain clinical measurements within normal limits will improve Outcome: Progressing Goal: Diagnostic test results will improve Outcome: Progressing Goal: Respiratory complications will improve Outcome: Progressing Goal: Cardiovascular complication will be avoided Outcome: Progressing   Problem:  Activity: Goal: Risk for activity intolerance will decrease Outcome: Progressing   Problem: Nutrition: Goal: Adequate nutrition will be maintained Outcome: Progressing   Problem: Coping: Goal: Level of anxiety will decrease Outcome: Progressing   Problem: Elimination: Goal: Will not experience complications related to bowel motility Outcome: Progressing Goal: Will not experience complications related to urinary retention Outcome: Progressing   Problem: Pain Management: Goal: General experience of comfort will improve Outcome: Progressing   Problem: Safety: Goal: Ability to remain free from injury will improve Outcome: Progressing   Problem: Skin Integrity: Goal: Risk for impaired skin integrity will decrease Outcome: Progressing   Problem: Clinical Measurements: Goal: Will remain free from infection Outcome: Not Progressing  Patient had a temp of 100.4, PRN tylenol was administered.

## 2023-04-16 ENCOUNTER — Inpatient Hospital Stay (HOSPITAL_COMMUNITY): Payer: Medicare Other

## 2023-04-16 ENCOUNTER — Encounter (HOSPITAL_COMMUNITY): Payer: Self-pay | Admitting: Family Medicine

## 2023-04-16 DIAGNOSIS — R6520 Severe sepsis without septic shock: Secondary | ICD-10-CM

## 2023-04-16 DIAGNOSIS — K810 Acute cholecystitis: Secondary | ICD-10-CM | POA: Diagnosis not present

## 2023-04-16 DIAGNOSIS — A419 Sepsis, unspecified organism: Secondary | ICD-10-CM

## 2023-04-16 DIAGNOSIS — E861 Hypovolemia: Secondary | ICD-10-CM | POA: Diagnosis not present

## 2023-04-16 DIAGNOSIS — R338 Other retention of urine: Secondary | ICD-10-CM

## 2023-04-16 DIAGNOSIS — N1831 Chronic kidney disease, stage 3a: Secondary | ICD-10-CM

## 2023-04-16 HISTORY — PX: IR PERC CHOLECYSTOSTOMY: IMG2326

## 2023-04-16 LAB — COMPREHENSIVE METABOLIC PANEL
ALT: 21 U/L (ref 0–44)
AST: 42 U/L — ABNORMAL HIGH (ref 15–41)
Albumin: 1.9 g/dL — ABNORMAL LOW (ref 3.5–5.0)
Alkaline Phosphatase: 49 U/L (ref 38–126)
Anion gap: 9 (ref 5–15)
BUN: 23 mg/dL (ref 8–23)
CO2: 21 mmol/L — ABNORMAL LOW (ref 22–32)
Calcium: 9.4 mg/dL (ref 8.9–10.3)
Chloride: 96 mmol/L — ABNORMAL LOW (ref 98–111)
Creatinine, Ser: 1.75 mg/dL — ABNORMAL HIGH (ref 0.61–1.24)
GFR, Estimated: 42 mL/min — ABNORMAL LOW (ref >60)
Glucose, Bld: 90 mg/dL (ref 70–99)
Potassium: 3.5 mmol/L (ref 3.5–5.1)
Sodium: 126 mmol/L — ABNORMAL LOW (ref 135–145)
Total Bilirubin: 1.2 mg/dL — ABNORMAL HIGH (ref <1.2)
Total Protein: 4.8 g/dL — ABNORMAL LOW (ref 6.5–8.1)

## 2023-04-16 LAB — CBC
HCT: 33.9 % — ABNORMAL LOW (ref 39.0–52.0)
Hemoglobin: 11.8 g/dL — ABNORMAL LOW (ref 13.0–17.0)
MCH: 32.1 pg (ref 26.0–34.0)
MCHC: 34.8 g/dL (ref 30.0–36.0)
MCV: 92.1 fL (ref 80.0–100.0)
Platelets: 95 10*3/uL — ABNORMAL LOW (ref 150–400)
RBC: 3.68 MIL/uL — ABNORMAL LOW (ref 4.22–5.81)
RDW: 14.7 % (ref 11.5–15.5)
WBC: 8.8 10*3/uL (ref 4.0–10.5)
nRBC: 0 % (ref 0.0–0.2)

## 2023-04-16 LAB — GLUCOSE, CAPILLARY
Glucose-Capillary: 95 mg/dL (ref 70–99)
Glucose-Capillary: 104 mg/dL — ABNORMAL HIGH (ref 70–99)
Glucose-Capillary: 91 mg/dL (ref 70–99)
Glucose-Capillary: 317 mg/dL — ABNORMAL HIGH (ref 70–99)

## 2023-04-16 LAB — PHOSPHORUS: Phosphorus: 1.9 mg/dL — ABNORMAL LOW (ref 2.5–4.6)

## 2023-04-16 LAB — PROTIME-INR
INR: 1.6 — ABNORMAL HIGH (ref 0.8–1.2)
Prothrombin Time: 19.3 s — ABNORMAL HIGH (ref 11.4–15.2)

## 2023-04-16 LAB — LIPASE, BLOOD: Lipase: 61 U/L — ABNORMAL HIGH (ref 11–51)

## 2023-04-16 LAB — MAGNESIUM: Magnesium: 1.8 mg/dL (ref 1.7–2.4)

## 2023-04-16 MED ORDER — ENSURE ENLIVE PO LIQD
237.0000 mL | Freq: Two times a day (BID) | ORAL | Status: DC
  Administered 2023-04-20 – 2023-04-23 (×8): 237 mL via ORAL

## 2023-04-16 MED ORDER — THIAMINE HCL 100 MG/ML IJ SOLN
100.0000 mg | Freq: Every day | INTRAMUSCULAR | Status: DC
  Filled 2023-04-16 (×3): qty 2

## 2023-04-16 MED ORDER — LIDOCAINE HCL 1 % IJ SOLN
INTRAMUSCULAR | Status: AC
  Filled 2023-04-16: qty 20

## 2023-04-16 MED ORDER — IOHEXOL 300 MG/ML  SOLN
50.0000 mL | Freq: Once | INTRAMUSCULAR | Status: AC | PRN
  Administered 2023-04-16: 15 mL

## 2023-04-16 MED ORDER — DILTIAZEM HCL ER COATED BEADS 240 MG PO CP24
240.0000 mg | ORAL_CAPSULE | Freq: Every day | ORAL | Status: DC
  Administered 2023-04-16 – 2023-04-22 (×6): 240 mg via ORAL
  Filled 2023-04-16 (×7): qty 1

## 2023-04-16 MED ORDER — METOPROLOL SUCCINATE ER 50 MG PO TB24
100.0000 mg | ORAL_TABLET | Freq: Every day | ORAL | Status: DC
  Administered 2023-04-16 – 2023-04-21 (×5): 100 mg via ORAL
  Filled 2023-04-16 (×7): qty 2

## 2023-04-16 MED ORDER — CHLORHEXIDINE GLUCONATE CLOTH 2 % EX PADS
6.0000 | MEDICATED_PAD | Freq: Every day | CUTANEOUS | Status: DC
  Administered 2023-04-16 – 2023-04-23 (×8): 6 via TOPICAL

## 2023-04-16 MED ORDER — FENTANYL CITRATE (PF) 100 MCG/2ML IJ SOLN
INTRAMUSCULAR | Status: AC
  Filled 2023-04-16: qty 2

## 2023-04-16 MED ORDER — THIAMINE MONONITRATE 100 MG PO TABS
100.0000 mg | ORAL_TABLET | Freq: Every day | ORAL | Status: DC
  Administered 2023-04-16 – 2023-04-23 (×8): 100 mg via ORAL
  Filled 2023-04-16 (×8): qty 1

## 2023-04-16 MED ORDER — LIDOCAINE-EPINEPHRINE 1 %-1:100000 IJ SOLN
20.0000 mL | Freq: Once | INTRAMUSCULAR | Status: AC
  Administered 2023-04-16: 10 mL via INTRADERMAL

## 2023-04-16 MED ORDER — ADULT MULTIVITAMIN W/MINERALS CH
1.0000 | ORAL_TABLET | Freq: Every day | ORAL | Status: DC
  Administered 2023-04-16 – 2023-04-20 (×5): 1 via ORAL
  Filled 2023-04-16 (×5): qty 1

## 2023-04-16 MED ORDER — MIDAZOLAM HCL 2 MG/2ML IJ SOLN
INTRAMUSCULAR | Status: AC | PRN
  Administered 2023-04-16: .5 mg via INTRAVENOUS
  Administered 2023-04-16: 1 mg via INTRAVENOUS

## 2023-04-16 MED ORDER — LIDOCAINE-EPINEPHRINE 1 %-1:100000 IJ SOLN
INTRAMUSCULAR | Status: AC
  Filled 2023-04-16: qty 1

## 2023-04-16 MED ORDER — LORAZEPAM 1 MG PO TABS: 1.0000 mg | ORAL_TABLET | ORAL | Status: AC | PRN

## 2023-04-16 MED ORDER — SODIUM CHLORIDE 0.9% FLUSH
5.0000 mL | Freq: Three times a day (TID) | INTRAVENOUS | Status: DC
  Administered 2023-04-16 – 2023-04-23 (×22): 5 mL

## 2023-04-16 MED ORDER — MIDAZOLAM HCL 2 MG/2ML IJ SOLN
INTRAMUSCULAR | Status: AC
  Filled 2023-04-16: qty 2

## 2023-04-16 MED ORDER — LACTATED RINGERS IV SOLN: INTRAVENOUS | Status: AC

## 2023-04-16 MED ORDER — FOLIC ACID 1 MG PO TABS
1.0000 mg | ORAL_TABLET | Freq: Every day | ORAL | Status: DC
  Administered 2023-04-16 – 2023-04-23 (×8): 1 mg via ORAL
  Filled 2023-04-16 (×8): qty 1

## 2023-04-16 MED ORDER — FENTANYL CITRATE (PF) 100 MCG/2ML IJ SOLN
INTRAMUSCULAR | Status: AC | PRN
  Administered 2023-04-16 (×2): 25 ug via INTRAVENOUS

## 2023-04-16 NOTE — Progress Notes (Signed)
   IR Note  Pt was seen fpr percutaneous cholecystomy drain placement in IR LD Eliquis 11/18 1 pm  Initially scheduled in IR for 11/21 Dr Dwain Sarna would like for IR to move ahead today Pt with pain and 48 hours after LD is today at 1 pm  Npo now Will plan for IR procedure today after 1 pm

## 2023-04-16 NOTE — Assessment & Plan Note (Addendum)
04-16-2023 continue with foley catheter. Continue flomax and proscar.  04-18-2023 pt wants to keep foley today. Will attempt foley removal tomorrow so he doesn't have to go to SNF with foley.  04-19-2023 will remove foley today. Check post-void residual with bladder scan q-shift. If he retains >250 ml of urine with post-void residual bladder scan, he will need foley catheter re-inserted and outpatient voiding trial with urology office.  04-20-2023 foley removed yesterday. Due to urinary retention, pt need foley re-inserted. He will go to SNF with foley and will need outpatient voiding trial in urology office in 4 weeks.  04-21-2023 will DC to SNF with foley. Will need outpatient urology office voiding trial. Will need f/u appointment with urology in 4 weeks.  04-22-2023 TOC consult to get urology appointment scheduled in 4 weeks for voiding trial.

## 2023-04-16 NOTE — Progress Notes (Signed)
Subjective/Chief Complaint: Back pain, otherwise feeling ok   Objective: Vital signs in last 24 hours: Temp:  [97.9 F (36.6 C)-98.7 F (37.1 C)] 97.9 F (36.6 C) (11/20 0804) Pulse Rate:  [68-114] 114 (11/20 0804) Resp:  [17-18] 18 (11/20 0804) BP: (97-135)/(59-80) 110/65 (11/20 0804) SpO2:  [92 %-100 %] 100 % (11/20 0804) Weight:  [78.5 kg] 78.5 kg (11/20 0503) Last BM Date : 04/14/23  Intake/Output from previous day: 11/19 0701 - 11/20 0700 In: 262.7 [I.V.:2; IV Piggyback:260.6] Out: 4050 [Urine:4050] Intake/Output this shift: No intake/output data recorded.  Ab mild tender ruq, soft nondistended  Lab Results:  Recent Labs    04/15/23 0654 04/16/23 0626  WBC 10.0 8.8  HGB 12.2* 11.8*  HCT 34.5* 33.9*  PLT 102* 95*   BMET Recent Labs    04/14/23 1535 04/15/23 0654  NA 128* 128*  K 3.5 3.4*  CL 97* 97*  CO2 18* 21*  GLUCOSE 70 94  BUN 31* 31*  CREATININE 2.04* 2.10*  CALCIUM 8.4* 9.3   PT/INR Recent Labs    04/15/23 1040 04/16/23 0626  LABPROT 19.2* 19.3*  INR 1.6* 1.6*   ABG No results for input(s): "PHART", "HCO3" in the last 72 hours.  Invalid input(s): "PCO2", "PO2"  Studies/Results: US Abdomen Limited RUQ (LIVER/GB)  Result Date: 04/14/2023 CLINICAL DATA:  Abdominal pain EXAM: ULTRASOUND ABDOMEN LIMITED RIGHT UPPER QUADRANT COMPARISON:  CT today. FINDINGS: Gallbladder: There is marked gallbladder wall thickening measuring up to 9 mm with pericholecystic fluid and tenderness over the gallbladder during the study compatible with acute cholecystitis. Sludge and stones within the gallbladder. Common bile duct: Diameter: Normal caliber, 5 mm Liver: No focal lesion identified. Within normal limits in parenchymal echogenicity. Portal vein is patent on color Doppler imaging with normal direction of blood flow towards the liver. Other: None. IMPRESSION: Cholelithiasis with evidence of acute cholecystitis. Electronically Signed   By: Charlett Nose  M.D.   On: 04/14/2023 22:19   CT ABDOMEN PELVIS W CONTRAST  Result Date: 04/14/2023 CLINICAL DATA:  LLQ abdominal pain abdominal pain, ?dtic. History of renal cell carcinoma. EXAM: CT ABDOMEN AND PELVIS WITH CONTRAST TECHNIQUE: Multidetector CT imaging of the abdomen and pelvis was performed using the standard protocol following bolus administration of intravenous contrast. RADIATION DOSE REDUCTION: This exam was performed according to the departmental dose-optimization program which includes automated exposure control, adjustment of the mA and/or kV according to patient size and/or use of iterative reconstruction technique. CONTRAST:  60mL OMNIPAQUE IOHEXOL 350 MG/ML SOLN COMPARISON:  CT abdomen pelvis 09/30/2004, ultrasound abdomen 10/02/2021, MRI abdomen 05/22/2020, CT abdomen 03/21/2021 FINDINGS: Lower chest: Tiny hiatal hernia.  No acute abnormality. Hepatobiliary: No focal liver abnormality. Calcified gallstone noted within the gallbladder lumen. Associated marked gallbladder wall thickening or pericholecystic fluid. A 1.6 cm gallstone likely impacted within the gallbladder neck. No biliary dilatation. Pancreas: No focal lesion. Normal pancreatic contour. No surrounding inflammatory changes. No main pancreatic ductal dilatation. Spleen: Normal in size without focal abnormality. Adrenals/Urinary Tract: No adrenal nodule bilaterally. Bilateral kidneys enhance symmetrically.  Partial right nephrectomy. No hydronephrosis. No hydroureter. Left nephrolithiasis measuring to 7 mm. Punctate right nephrolithiasis. No ureterolithiasis bilaterally. Stable in size fluid density lesions within the kidneys likely represent simple renal cysts. Simple renal cysts, in the absence of clinically indicated signs/symptoms, require no independent follow-up. The urinary bladder is distended with urine with mild trabeculations noted. On delayed imaging, there is no urothelial wall thickening and there are no filling defects in  the  opacified portions of the bilateral collecting systems or ureters. Stomach/Bowel: Incidentally noted possible thinning of the gastric fundal wall (3:6). The stomach is otherwise within normal limits. No evidence of small bowel wall thickening or dilatation. Colonic diverticulosis. Slightly irregular bowel wall thickening of the hepatic flexure along the a focal diverticula (8:53). Mild bowel wall thickening of the ascending colon. Right appendix appears normal. Vascular/Lymphatic: No abdominal aorta or iliac aneurysm. Severe atherosclerotic plaque of the aorta and its branches. No abdominal, pelvic, or inguinal lymphadenopathy. Reproductive: Prostate is prominent in size. Other: Right upper quadrant mesenteric fat stranding and trace free fluid. No intraperitoneal free gas. No organized fluid collection. Musculoskeletal: No abdominal wall hernia or abnormality. No suspicious lytic or blastic osseous lesions. No acute displaced fracture. Multilevel degenerative changes of the spine. Intervertebral disc space vacuum phenomenon at the L3-L4 and L4-L5 levels. IMPRESSION: 1. Cholelithiasis with acute cholecystitis. Associated 1.6 cm gallstone likely impacted within the gallbladder neck. No CT evidence of choledocholithiasis. Recommend surgical consultation. 2. Bowel thickening of the hepatic flexure and mild bowel wall thickening of the ascending colon in the setting of colonic diverticulosis. Finding likely reactive in etiology. Given slight focal irregular bowel thickening along the colonic diverticula, differential diagnosis includes much less likely concurrent acute diverticulitis. Consider colonoscopy status post treatment and status post complete resolution of inflammatory changes to exclude an underlying lesion. 3. Incidentally noted possible thinning of the gastric fundal wall. Limited evaluation due to under distension of the stomach. Recommend correlation with signs and symptoms of gastric ulceration.  Consider outpatient endoscopy for direct visualization if clinically indicated. 4. Nonobstructive bilateral nephrolithiasis measuring up to 7 mm on the left and punctate on the right. 5. Status post partial right nephrectomy. 6. Prostate is prominent in size with associated urinary bladder distension with urine and mild urinary bladder wall trabeculations. Correlate with signs and symptoms of obstructive uropathy. 7.  Aortic Atherosclerosis (ICD10-I70.0)-severe. Electronically Signed   By: Tish Frederickson M.D.   On: 04/14/2023 20:12   DG Chest Portable 1 View  Result Date: 04/14/2023 CLINICAL DATA:  Fall. EXAM: PORTABLE CHEST 1 VIEW COMPARISON:  Chest radiograph dated March 18, 2022. CT chest dated May 28, 2022. FINDINGS: Patient is rotated to the right. The heart size and mediastinal contours are otherwise within normal limits. Aortic atherosclerosis. Coarse interstitial markings. No focal consolidation, sizeable pleural effusion, or pneumothorax. Remote bilateral healed rib fractures. No acute osseous abnormality. IMPRESSION: 1. No acute findings in the chest. 2. Emphysema. Electronically Signed   By: Hart Robinsons M.D.   On: 04/14/2023 17:18    Anti-infectives: Anti-infectives (From admission, onward)    Start     Dose/Rate Route Frequency Ordered Stop   04/15/23 1800  cefTRIAXone (ROCEPHIN) 2 g in sodium chloride 0.9 % 100 mL IVPB        2 g 200 mL/hr over 30 Minutes Intravenous Every 24 hours 04/14/23 2341     04/15/23 0800  metroNIDAZOLE (FLAGYL) IVPB 500 mg        500 mg 100 mL/hr over 60 Minutes Intravenous 2 times daily 04/14/23 2345     04/14/23 2200  metroNIDAZOLE (FLAGYL) IVPB 500 mg  Status:  Discontinued        500 mg 100 mL/hr over 60 Minutes Intravenous 2 times daily 04/14/23 2341 04/14/23 2345   04/14/23 1645  cefTRIAXone (ROCEPHIN) 2 g in sodium chloride 0.9 % 100 mL IVPB       Placed in "And" Linked Group   2 g  200 mL/hr over 30 Minutes Intravenous  Once 04/14/23  1630 04/14/23 1805   04/14/23 1645  metroNIDAZOLE (FLAGYL) IVPB 500 mg       Placed in "And" Linked Group   500 mg 100 mL/hr over 60 Minutes Intravenous  Once 04/14/23 1630 04/14/23 1913       Assessment/Plan: Cholecystitis - I think best option is a perc chole now for him given all of that and discuss lap chole in around 6 weeks    Emelia Loron 04/16/2023

## 2023-04-16 NOTE — Plan of Care (Signed)

## 2023-04-16 NOTE — Progress Notes (Signed)
PROGRESS NOTE    Angel Shelton  [name]:[id number] DOB: April 21, 1956 DOA: 04/14/2023 PCP: Angel Polka., MD  Subjective: Pt seen and examined.  General surgery has requested IR to place cholecystostomy tube today. Pt lives at home alone. Will need home health to help cholecystostomy tube.   Hospital Course: HPI: Angel Shelton is a 67 y.o. male with medical history significant of HTN, atrial fibrillation on Eliquis, CVA, type 2 diabetes, history of renal mass s/p right partial nephrectomy who presents with abdominal pain.   Patient reports left lower quadrant abdominal pain that has radiated to his right abdomen since last week.  Has been weak and mostly been staying in bed.  Had decreased oral intake.  However has still been compliant with his medication including his antihypertensives.  He denies any nausea, vomiting or diarrhea.  Patient is on Eliquis for atrial fibrillation and took last dose today at 1 PM.   On arrival to ED, he was afebrile, BP of 70/58 on room air.   CBC with leukocytosis of 14.7, hemoglobin of 13.5, thrombocytopenia of 115.  Lactate initially elevated at 2.2   CMP with hyponatremia of 128, chloride of 97, CO2 of 18 with normal anion gap of 13 although trending up from prior.  Creatinine also elevated at 2.04 with unclear baseline. LFTs are within normal limits but with hyperbilirubinemia 2.9.  Lipase within normal limits.   CT abdomen chest with calcified gallstone noted within gallbladder lumen and another likely impacted gallstone within the gallbladder neck.  Findings concerning for acute cholecystitis.  There is also nonobstructive bilateral nephrolithiasis. Acute cholecystitis was confirmed on right upper quadrant ultrasound.   General surgery was consulted and recommend keeping n.p.o. at midnight for surgical intervention in the morning.  He was started on IV Rocephin and Flagyl in the ED as well as given 3 L of normal saline bolus fluids.   Hospitalist consulted for admission.  Significant Events: Admitted 04/14/2023 for acute cholecystitis   Significant Labs: Admission WBC 14.7, lactic acid 2.2, Na 128  Significant Imaging Studies: Admission CT abd shows Cholelithiasis with acute cholecystitis. Associated 1.6 cm gallstone likely impacted within the gallbladder neck. No CT evidence of choledocholithiasis. Recommend surgical consultation. 2. Bowel thickening of the hepatic flexure and mild bowel wall thickening of the ascending colon in the setting of colonic diverticulosis. Finding likely reactive in etiology. Given slight focal irregular bowel thickening along the colonic diverticula, differential diagnosis includes much less likely concurrent acute diverticulitis. Consider colonoscopy status post treatment and status post complete resolution of inflammatory changes to exclude an underlying lesion. 3. Incidentally noted possible thinning of the gastric fundal wall. Limited evaluation due to under distension of the stomach. Recommend correlation with signs and symptoms of gastric ulceration. Consider outpatient endoscopy for direct visualization if clinically indicated. 4. Nonobstructive bilateral nephrolithiasis measuring up to 7 mm on the left and punctate on the right. 5. Status post partial right nephrectomy. 6. Prostate is prominent in size with associated urinary bladder distension with urine and mild urinary bladder wall trabeculations. Correlate with signs and symptoms of obstructive uropathy. 7.  Aortic Atherosclerosis (ICD10-I70.0)-severe. Admission RUQ U/S shows Cholelithiasis with evidence of acute cholecystitis   Antibiotic Therapy: Anti-infectives (From admission, onward)    Start     Dose/Rate Route Frequency Ordered Stop   04/15/23 1800  cefTRIAXone (ROCEPHIN) 2 g in sodium chloride 0.9 % 100 mL IVPB        2 g 200 mL/hr over 30 Minutes Intravenous  Every 24 hours 04/14/23 2341     04/15/23 0800  metroNIDAZOLE  (FLAGYL) IVPB 500 mg        500 mg 100 mL/hr over 60 Minutes Intravenous 2 times daily 04/14/23 2345     04/14/23 2200  metroNIDAZOLE (FLAGYL) IVPB 500 mg  Status:  Discontinued        500 mg 100 mL/hr over 60 Minutes Intravenous 2 times daily 04/14/23 2341 04/14/23 2345   04/14/23 1645  cefTRIAXone (ROCEPHIN) 2 g in sodium chloride 0.9 % 100 mL IVPB       Placed in "And" Linked Group   2 g 200 mL/hr over 30 Minutes Intravenous  Once 04/14/23 1630 04/14/23 1805   04/14/23 1645  metroNIDAZOLE (FLAGYL) IVPB 500 mg       Placed in "And" Linked Group   500 mg 100 mL/hr over 60 Minutes Intravenous  Once 04/14/23 1630 04/14/23 1913       Procedures:   Consultants: General Surgery IR    Assessment and Plan: * Acute cholecystitis 04-14-2023  CT imaging with calcified gallstone noted within gallbladder lumen and another likely impacted gallstone within the gallbladder neck.  -General surgery consulted. Pt last took Eliquis at 1pm today. Will awaiting general surgery recommendation for timing of acute cholecystectomy.  Will keep n.p.o. past midnight. -Continue IV Rocephin and Flagyl  04-16-2023 general surgery has requested IR place cholecystostomy tube today. Continue IV ABX with rocephin, flagyl  Sepsis with acute organ dysfunction (HCC) 04-16-2023 present on admission. WBC 14.7, lactic acid 2.2, acute cholecystitis seen on CT abd   Hypotension 04-14-2023 BP of 70/58 on presentation. Now normotensive with 3L of NS bolus -Continue to hold antihypertensives overnight -Can resume lisinopril-HCTZ, diltiazem, metoprolol and Jardiance in the morning  04-16-2023 resolved. Continue to hold antihypertensives.  Acute urinary retention 04-16-2023 continue with foley catheter. Continue flomax and proscar.  CKD stage 3a, GFR 45-59 ml/min (HCC) - baseline SCr 1.5 04-16-2023 continue IVF while NPO.  Lactic acidosis 04-14-2023 -Secondary to dehydration -has received 3L NS bolus  04-16-2023  resolved.  Abnormal CT of the abdomen 04-14-2023- CT a/p with Bowel thickening of the hepatic flexure and mild bowel wall thickening of the ascending colon in the setting of colonic diverticulosis. Finding likely reactive in  etiology. Given slight focal irregular bowel thickening along the colonic diverticula, differential diagnosis includes much less likely concurrent acute diverticulitis. Consider colonoscopy  status post treatment and status post complete resolution of inflammatory changes to exclude an underlying lesion. -there was also findings of possible thickening of the gastric fundal wall but patient has no signs of symptoms of gastric ulceration  04-16-2023 - will need outpatient GI referral for EGD/colonoscopy.  Thrombocytopenia (HCC) 04-14-2023 -likely reactive  04-16-2023 stable @ 95K  Chronic hyponatremia - Baseline around 128 04-14-2023 -due to dehydration. -Received 3 L of normal saline bolus. - Follow repeat in the morning  04-16-2023 chronic. Baseline around 128  History of CVA (cerebrovascular accident) 04-14-2023 -has already taken dose of aspirin today. Hold pending possible surgery tomorrow  04-16-2023 stable.  Atrial fibrillation, permanent (HCC) 04-14-2023  Resume diltiazem and metoprolol tomorrow due to hypotension on presentation  04-16-2023 stable. Continue to hold cardizem and lopressor due to normal BP.  Dyslipidemia associated with type 2 diabetes mellitus (HCC) 04-14-2023 Continue statin and fish oil  04-16-2023 continue crestor 40 mg every day, lovaza every day, fenofibrate 160 mg qd  DM2 (diabetes mellitus, type 2) (HCC) 04-14-2023  A1c last year of 6.2.  No  hypoglycemia on presentation. -Holding Jardiance due to hypotension -on 10 unit of Toujeo at home -place on SSI  04-16-2023 continue SSI.   DVT prophylaxis: SCDs Start: 04/14/23 2319    Code Status: Full Code Family Communication: no family at bedside. Pt is decisional Disposition  Plan: return home Reason for continuing need for hospitalization: needs cholecystostomy tube placed today. Remains on IV abx.  Objective: Vitals:   04/15/23 2133 04/16/23 0008 04/16/23 0503 04/16/23 0804  BP: 115/80 129/77 135/71 110/65  Pulse: 68 94 85 (!) 114  Resp: 18 18 17 18   Temp: 97.9 F (36.6 C) 98.7 F (37.1 C) 98.5 F (36.9 C) 97.9 F (36.6 C)  TempSrc: Oral Oral Oral Oral  SpO2: 92%  97% 100%  Weight:   78.5 kg   Height:        Intake/Output Summary (Last 24 hours) at 04/16/2023 1008 Last data filed at 04/16/2023 0500 Gross per 24 hour  Intake 262.68 ml  Output 4050 ml  Net -3787.32 ml   Filed Weights   04/15/23 0122 04/16/23 0503  Weight: 77.2 kg 78.5 kg    Examination:  Physical Exam Vitals and nursing note reviewed.  Constitutional:      General: He is not in acute distress.    Appearance: He is not toxic-appearing.     Comments: Appears chronically ill  HENT:     Head: Normocephalic and atraumatic.     Nose: Nose normal.  Cardiovascular:     Rate and Rhythm: Normal rate and regular rhythm.  Pulmonary:     Effort: Pulmonary effort is normal.     Breath sounds: Normal breath sounds.  Abdominal:     General: Bowel sounds are normal.     Palpations: Abdomen is soft.     Tenderness: There is abdominal tenderness in the right upper quadrant.  Musculoskeletal:     Right lower leg: No edema.     Left lower leg: No edema.  Skin:    General: Skin is warm and dry.     Capillary Refill: Capillary refill takes less than 2 seconds.  Neurological:     Mental Status: He is alert and oriented to person, place, and time.     Data Reviewed: I have personally reviewed following labs and imaging studies  CBC: Recent Labs  Lab 04/14/23 1423 04/15/23 0654 04/16/23 0626  WBC 14.7* 10.0 8.8  HGB 13.5 12.2* 11.8*  HCT 40.0 34.5* 33.9*  MCV 94.1 92.5 92.1  PLT 115* 102* 95*   Basic Metabolic Panel: Recent Labs  Lab 04/14/23 1535 04/14/23 2339  04/15/23 0654 04/16/23 0626  NA 128*  --  128* 126*  K 3.5  --  3.4* 3.5  CL 97*  --  97* 96*  CO2 18*  --  21* 21*  GLUCOSE 70  --  94 90  BUN 31*  --  31* 23  CREATININE 2.04*  --  2.10* 1.75*  CALCIUM 8.4*  --  9.3 9.4  MG  --  1.6*  --  1.8  PHOS  --  3.6  --  1.9*   GFR: Estimated Creatinine Clearance: 41.7 mL/min (A) (by C-G formula based on SCr of 1.75 mg/dL (H)). Liver Function Tests: Recent Labs  Lab 04/14/23 1535 04/15/23 1040 04/16/23 0626  AST 35 51* 42*  ALT 20 23 21   ALKPHOS 39 55 49  BILITOT 2.9* 1.3* 1.2*  PROT 4.7* 5.3* 4.8*  ALBUMIN 2.2* 2.2* 1.9*   Recent Labs  Lab  04/14/23 1535 04/16/23 0626  LIPASE 51 61*    Coagulation Profile: Recent Labs  Lab 04/15/23 1040 04/16/23 0626  INR 1.6* 1.6*   HbA1C: Recent Labs    04/15/23 0654  HGBA1C 7.4*   CBG: Recent Labs  Lab 04/15/23 1353 04/15/23 1410 04/15/23 1637 04/15/23 2135 04/16/23 0759  GLUCAP 69* 111* 213* 187* 95   Sepsis Labs: Recent Labs  Lab 04/14/23 1437 04/14/23 1643  LATICACIDVEN 2.2* 1.6    Radiology Studies: US Abdomen Limited RUQ (LIVER/GB)  Result Date: 04/14/2023 CLINICAL DATA:  Abdominal pain EXAM: ULTRASOUND ABDOMEN LIMITED RIGHT UPPER QUADRANT COMPARISON:  CT today. FINDINGS: Gallbladder: There is marked gallbladder wall thickening measuring up to 9 mm with pericholecystic fluid and tenderness over the gallbladder during the study compatible with acute cholecystitis. Sludge and stones within the gallbladder. Common bile duct: Diameter: Normal caliber, 5 mm Liver: No focal lesion identified. Within normal limits in parenchymal echogenicity. Portal vein is patent on color Doppler imaging with normal direction of blood flow towards the liver. Other: None. IMPRESSION: Cholelithiasis with evidence of acute cholecystitis. Electronically Signed   By: Charlett Nose M.D.   On: 04/14/2023 22:19   CT ABDOMEN PELVIS W CONTRAST  Result Date: 04/14/2023 CLINICAL DATA:  LLQ  abdominal pain abdominal pain, ?dtic. History of renal cell carcinoma. EXAM: CT ABDOMEN AND PELVIS WITH CONTRAST TECHNIQUE: Multidetector CT imaging of the abdomen and pelvis was performed using the standard protocol following bolus administration of intravenous contrast. RADIATION DOSE REDUCTION: This exam was performed according to the departmental dose-optimization program which includes automated exposure control, adjustment of the mA and/or kV according to patient size and/or use of iterative reconstruction technique. CONTRAST:  60mL OMNIPAQUE IOHEXOL 350 MG/ML SOLN COMPARISON:  CT abdomen pelvis 09/30/2004, ultrasound abdomen 10/02/2021, MRI abdomen 05/22/2020, CT abdomen 03/21/2021 FINDINGS: Lower chest: Tiny hiatal hernia.  No acute abnormality. Hepatobiliary: No focal liver abnormality. Calcified gallstone noted within the gallbladder lumen. Associated marked gallbladder wall thickening or pericholecystic fluid. A 1.6 cm gallstone likely impacted within the gallbladder neck. No biliary dilatation. Pancreas: No focal lesion. Normal pancreatic contour. No surrounding inflammatory changes. No main pancreatic ductal dilatation. Spleen: Normal in size without focal abnormality. Adrenals/Urinary Tract: No adrenal nodule bilaterally. Bilateral kidneys enhance symmetrically.  Partial right nephrectomy. No hydronephrosis. No hydroureter. Left nephrolithiasis measuring to 7 mm. Punctate right nephrolithiasis. No ureterolithiasis bilaterally. Stable in size fluid density lesions within the kidneys likely represent simple renal cysts. Simple renal cysts, in the absence of clinically indicated signs/symptoms, require no independent follow-up. The urinary bladder is distended with urine with mild trabeculations noted. On delayed imaging, there is no urothelial wall thickening and there are no filling defects in the opacified portions of the bilateral collecting systems or ureters. Stomach/Bowel: Incidentally noted  possible thinning of the gastric fundal wall (3:6). The stomach is otherwise within normal limits. No evidence of small bowel wall thickening or dilatation. Colonic diverticulosis. Slightly irregular bowel wall thickening of the hepatic flexure along the a focal diverticula (8:53). Mild bowel wall thickening of the ascending colon. Right appendix appears normal. Vascular/Lymphatic: No abdominal aorta or iliac aneurysm. Severe atherosclerotic plaque of the aorta and its branches. No abdominal, pelvic, or inguinal lymphadenopathy. Reproductive: Prostate is prominent in size. Other: Right upper quadrant mesenteric fat stranding and trace free fluid. No intraperitoneal free gas. No organized fluid collection. Musculoskeletal: No abdominal wall hernia or abnormality. No suspicious lytic or blastic osseous lesions. No acute displaced fracture. Multilevel degenerative changes of the  spine. Intervertebral disc space vacuum phenomenon at the L3-L4 and L4-L5 levels. IMPRESSION: 1. Cholelithiasis with acute cholecystitis. Associated 1.6 cm gallstone likely impacted within the gallbladder neck. No CT evidence of choledocholithiasis. Recommend surgical consultation. 2. Bowel thickening of the hepatic flexure and mild bowel wall thickening of the ascending colon in the setting of colonic diverticulosis. Finding likely reactive in etiology. Given slight focal irregular bowel thickening along the colonic diverticula, differential diagnosis includes much less likely concurrent acute diverticulitis. Consider colonoscopy status post treatment and status post complete resolution of inflammatory changes to exclude an underlying lesion. 3. Incidentally noted possible thinning of the gastric fundal wall. Limited evaluation due to under distension of the stomach. Recommend correlation with signs and symptoms of gastric ulceration. Consider outpatient endoscopy for direct visualization if clinically indicated. 4. Nonobstructive bilateral  nephrolithiasis measuring up to 7 mm on the left and punctate on the right. 5. Status post partial right nephrectomy. 6. Prostate is prominent in size with associated urinary bladder distension with urine and mild urinary bladder wall trabeculations. Correlate with signs and symptoms of obstructive uropathy. 7.  Aortic Atherosclerosis (ICD10-I70.0)-severe. Electronically Signed   By: Tish Frederickson M.D.   On: 04/14/2023 20:12   DG Chest Portable 1 View  Result Date: 04/14/2023 CLINICAL DATA:  Fall. EXAM: PORTABLE CHEST 1 VIEW COMPARISON:  Chest radiograph dated March 18, 2022. CT chest dated May 28, 2022. FINDINGS: Patient is rotated to the right. The heart size and mediastinal contours are otherwise within normal limits. Aortic atherosclerosis. Coarse interstitial markings. No focal consolidation, sizeable pleural effusion, or pneumothorax. Remote bilateral healed rib fractures. No acute osseous abnormality. IMPRESSION: 1. No acute findings in the chest. 2. Emphysema. Electronically Signed   By: Hart Robinsons M.D.   On: 04/14/2023 17:18    Scheduled Meds:  Chlorhexidine Gluconate Cloth  6 each Topical Daily   feeding supplement  237 mL Oral BID BM   fenofibrate  160 mg Oral Daily   finasteride  5 mg Oral Daily   gabapentin  300 mg Oral TID   insulin aspart  0-9 Units Subcutaneous TID WC   Lotilaner  1 drop Both Eyes BID   omega-3 acid ethyl esters  1 g Oral Daily   pantoprazole  40 mg Oral QAC breakfast   rosuvastatin  40 mg Oral Daily   tamsulosin  0.4 mg Oral QPC supper   Continuous Infusions:  cefTRIAXone (ROCEPHIN)  IV Stopped (04/15/23 1819)   lactated ringers     metronidazole 500 mg (04/16/23 0930)     LOS: 2 days   Time spent: 35 minutes  Carollee Herter, DO  [facility]  04/16/2023, 10:08 AM

## 2023-04-16 NOTE — Subjective & Objective (Addendum)
Pt seen and examined. Pt remains with foley Still has not picked SNF yet. Reminded pt he needs to pick SNF today Otherwise stable for DC.

## 2023-04-16 NOTE — Progress Notes (Signed)
   Spoke with pt's dtr stephanie letchworth. She identifies as a Publishing rights manager.  She states pt is a heavy etoh user. 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 She has not seen in about 2-3 years.  Gave dtr update.   Will start CIWA  Dtr states that pt has had CVA in the past after being off Eliquis for 48 hours.  Carollee Herter, DO [facility]

## 2023-04-16 NOTE — Assessment & Plan Note (Addendum)
04-16-2023 present on admission. WBC 14.7, lactic acid 2.2, acute cholecystitis seen on CT abd 04-17-2023 resolved. WBC down to 8.7. no fevers.

## 2023-04-16 NOTE — Hospital Course (Signed)
HPI: Angel Shelton is a 67 y.o. male with medical history significant of HTN, atrial fibrillation on Eliquis, CVA, type 2 diabetes, history of renal mass s/p right partial nephrectomy who presents with abdominal pain.   Patient reports left lower quadrant abdominal pain that has radiated to his right abdomen since last week.  Has been weak and mostly been staying in bed.  Had decreased oral intake.  However has still been compliant with his medication including his antihypertensives.  He denies any nausea, vomiting or diarrhea.  Patient is on Eliquis for atrial fibrillation and took last dose today at 1 PM.   On arrival to ED, he was afebrile, BP of 70/58 on room air.   CBC with leukocytosis of 14.7, hemoglobin of 13.5, thrombocytopenia of 115.  Lactate initially elevated at 2.2   CMP with hyponatremia of 128, chloride of 97, CO2 of 18 with normal anion gap of 13 although trending up from prior.  Creatinine also elevated at 2.04 with unclear baseline. LFTs are within normal limits but with hyperbilirubinemia 2.9.  Lipase within normal limits.   CT abdomen chest with calcified gallstone noted within gallbladder lumen and another likely impacted gallstone within the gallbladder neck.  Findings concerning for acute cholecystitis.  There is also nonobstructive bilateral nephrolithiasis. Acute cholecystitis was confirmed on right upper quadrant ultrasound.   General surgery was consulted and recommend keeping n.p.o. at midnight for surgical intervention in the morning.  He was started on IV Rocephin and Flagyl in the ED as well as given 3 L of normal saline bolus fluids.  Hospitalist consulted for admission.  Significant Events: Admitted 04/14/2023 for acute cholecystitis   Significant Labs: Admission WBC 14.7, lactic acid 2.2, Na 128  Significant Imaging Studies: Admission CT abd shows Cholelithiasis with acute cholecystitis. Associated 1.6 cm gallstone likely impacted within the  gallbladder neck. No CT evidence of choledocholithiasis. Recommend surgical consultation. 2. Bowel thickening of the hepatic flexure and mild bowel wall thickening of the ascending colon in the setting of colonic diverticulosis. Finding likely reactive in etiology. Given slight focal irregular bowel thickening along the colonic diverticula, differential diagnosis includes much less likely concurrent acute diverticulitis. Consider colonoscopy status post treatment and status post complete resolution of inflammatory changes to exclude an underlying lesion. 3. Incidentally noted possible thinning of the gastric fundal wall. Limited evaluation due to under distension of the stomach. Recommend correlation with signs and symptoms of gastric ulceration. Consider outpatient endoscopy for direct visualization if clinically indicated. 4. Nonobstructive bilateral nephrolithiasis measuring up to 7 mm on the left and punctate on the right. 5. Status post partial right nephrectomy. 6. Prostate is prominent in size with associated urinary bladder distension with urine and mild urinary bladder wall trabeculations. Correlate with signs and symptoms of obstructive uropathy. 7.  Aortic Atherosclerosis (ICD10-I70.0)-severe. Admission RUQ U/S shows Cholelithiasis with evidence of acute cholecystitis   Antibiotic Therapy: Anti-infectives (From admission, onward)    Start     Dose/Rate Route Frequency Ordered Stop   04/15/23 1800  cefTRIAXone (ROCEPHIN) 2 g in sodium chloride 0.9 % 100 mL IVPB        2 g 200 mL/hr over 30 Minutes Intravenous Every 24 hours 04/14/23 2341     04/15/23 0800  metroNIDAZOLE (FLAGYL) IVPB 500 mg        500 mg 100 mL/hr over 60 Minutes Intravenous 2 times daily 04/14/23 2345     04/14/23 2200  metroNIDAZOLE (FLAGYL) IVPB 500 mg  Status:  Discontinued  500 mg 100 mL/hr over 60 Minutes Intravenous 2 times daily 04/14/23 2341 04/14/23 2345   04/14/23 1645  cefTRIAXone (ROCEPHIN) 2 g in sodium  chloride 0.9 % 100 mL IVPB       Placed in "And" Linked Group   2 g 200 mL/hr over 30 Minutes Intravenous  Once 04/14/23 1630 04/14/23 1805   04/14/23 1645  metroNIDAZOLE (FLAGYL) IVPB 500 mg       Placed in "And" Linked Group   500 mg 100 mL/hr over 60 Minutes Intravenous  Once 04/14/23 1630 04/14/23 1913       Procedures:   Consultants: General Surgery IR

## 2023-04-16 NOTE — Procedures (Signed)
Pre procedural Dx: Acute cholecysitis Post procedural Dx: Same  Technically successful [place] and Fluoro guided placement of a 10 Fr drainage catheter placement into the gallbladder lumen. Sample of aspirated, foul smelling bile sent to the lab for analysis. Chole tube connected to gravity bag.  EBL: None Complications: None immediate  Katherina Right, MD Pager #: [phone]

## 2023-04-16 NOTE — Assessment & Plan Note (Addendum)
04-16-2023 continue IVF while NPO.  04-17-2023 Scr back down to 1.55. which is his baseline.  04-21-2023 stable. Scr 1.51 yesterday.

## 2023-04-17 DIAGNOSIS — I4821 Permanent atrial fibrillation: Secondary | ICD-10-CM | POA: Diagnosis not present

## 2023-04-17 DIAGNOSIS — E861 Hypovolemia: Secondary | ICD-10-CM | POA: Diagnosis not present

## 2023-04-17 DIAGNOSIS — K810 Acute cholecystitis: Secondary | ICD-10-CM | POA: Diagnosis not present

## 2023-04-17 DIAGNOSIS — A419 Sepsis, unspecified organism: Secondary | ICD-10-CM | POA: Diagnosis not present

## 2023-04-17 LAB — GLUCOSE, CAPILLARY
Glucose-Capillary: 147 mg/dL — ABNORMAL HIGH (ref 70–99)
Glucose-Capillary: 135 mg/dL — ABNORMAL HIGH (ref 70–99)
Glucose-Capillary: 115 mg/dL — ABNORMAL HIGH (ref 70–99)
Glucose-Capillary: 116 mg/dL — ABNORMAL HIGH (ref 70–99)

## 2023-04-17 LAB — HEPATIC FUNCTION PANEL
ALT: 20 U/L (ref 0–44)
AST: 29 U/L (ref 15–41)
Albumin: 1.9 g/dL — ABNORMAL LOW (ref 3.5–5.0)
Alkaline Phosphatase: 46 U/L (ref 38–126)
Bilirubin, Direct: 0.4 mg/dL — ABNORMAL HIGH (ref 0.0–0.2)
Indirect Bilirubin: 0.5 mg/dL (ref 0.3–0.9)
Total Bilirubin: 0.9 mg/dL (ref <1.2)
Total Protein: 4.5 g/dL — ABNORMAL LOW (ref 6.5–8.1)

## 2023-04-17 LAB — CBC
HCT: 32.6 % — ABNORMAL LOW (ref 39.0–52.0)
Hemoglobin: 11.5 g/dL — ABNORMAL LOW (ref 13.0–17.0)
MCH: 31.6 pg (ref 26.0–34.0)
MCHC: 35.3 g/dL (ref 30.0–36.0)
MCV: 89.6 fL (ref 80.0–100.0)
Platelets: 113 10*3/uL — ABNORMAL LOW (ref 150–400)
RBC: 3.64 MIL/uL — ABNORMAL LOW (ref 4.22–5.81)
RDW: 14.9 % (ref 11.5–15.5)
WBC: 8.7 10*3/uL (ref 4.0–10.5)
nRBC: 0 % (ref 0.0–0.2)

## 2023-04-17 LAB — TYPE AND SCREEN
ABO/RH(D): A POS
Antibody Screen: POSITIVE
Donor AG Type: NEGATIVE
Donor AG Type: NEGATIVE
Unit division: 0
Unit division: 0

## 2023-04-17 LAB — BPAM RBC
Blood Product Expiration Date: 202412232359
Blood Product Expiration Date: 202412232359
Unit Type and Rh: 5100
Unit Type and Rh: 5100

## 2023-04-17 LAB — MAGNESIUM: Magnesium: 1.6 mg/dL — ABNORMAL LOW (ref 1.7–2.4)

## 2023-04-17 LAB — BASIC METABOLIC PANEL
Anion gap: 6 (ref 5–15)
BUN: 22 mg/dL (ref 8–23)
CO2: 23 mmol/L (ref 22–32)
Calcium: 9.7 mg/dL (ref 8.9–10.3)
Chloride: 95 mmol/L — ABNORMAL LOW (ref 98–111)
Creatinine, Ser: 1.55 mg/dL — ABNORMAL HIGH (ref 0.61–1.24)
GFR, Estimated: 48 mL/min — ABNORMAL LOW (ref >60)
Glucose, Bld: 111 mg/dL — ABNORMAL HIGH (ref 70–99)
Potassium: 3.7 mmol/L (ref 3.5–5.1)
Sodium: 124 mmol/L — ABNORMAL LOW (ref 135–145)

## 2023-04-17 LAB — LIPASE, BLOOD: Lipase: 52 U/L — ABNORMAL HIGH (ref 11–51)

## 2023-04-17 LAB — PHOSPHORUS: Phosphorus: 2.3 mg/dL — ABNORMAL LOW (ref 2.5–4.6)

## 2023-04-17 LAB — PROTIME-INR
INR: 1.4 — ABNORMAL HIGH (ref 0.8–1.2)
Prothrombin Time: 17.1 s — ABNORMAL HIGH (ref 11.4–15.2)

## 2023-04-17 MED ORDER — METRONIDAZOLE 500 MG PO TABS: 500.0000 mg | ORAL_TABLET | Freq: Two times a day (BID) | ORAL | Status: DC

## 2023-04-17 MED ORDER — APIXABAN 5 MG PO TABS: 5.0000 mg | ORAL_TABLET | Freq: Two times a day (BID) | ORAL | Status: DC

## 2023-04-17 MED ORDER — AMOXICILLIN-POT CLAVULANATE 875-125 MG PO TABS
1.0000 | ORAL_TABLET | Freq: Two times a day (BID) | ORAL | Status: DC
  Administered 2023-04-17 – 2023-04-23 (×13): 1 via ORAL
  Filled 2023-04-17 (×13): qty 1

## 2023-04-17 MED ORDER — SACCHAROMYCES BOULARDII 250 MG PO CAPS
250.0000 mg | ORAL_CAPSULE | Freq: Two times a day (BID) | ORAL | Status: DC
  Administered 2023-04-17 – 2023-04-23 (×14): 250 mg via ORAL
  Filled 2023-04-17 (×14): qty 1

## 2023-04-17 MED ORDER — MAGNESIUM SULFATE 2 GM/50ML IV SOLN
2.0000 g | Freq: Once | INTRAVENOUS | Status: AC
  Administered 2023-04-17: 2 g via INTRAVENOUS
  Filled 2023-04-17: qty 50

## 2023-04-17 MED ORDER — POTASSIUM CHLORIDE CRYS ER 20 MEQ PO TBCR
40.0000 meq | EXTENDED_RELEASE_TABLET | ORAL | Status: AC
  Administered 2023-04-17 (×2): 40 meq via ORAL
  Filled 2023-04-17 (×2): qty 2

## 2023-04-17 NOTE — Plan of Care (Signed)

## 2023-04-17 NOTE — [place] FL2 (Signed)
[place] MEDICAID FL2 LEVEL OF CARE FORM     IDENTIFICATION  Patient Name: Angel Shelton Birthdate: 03/02/1956 Sex: male Admission Date (Current Location): 04/14/2023  [facility] and [place] Number:  Producer, television/film/video and Address:  [facility] [place] [address] [place], [place] [address]      Provider Number: [id number]  Attending Physician Name and Address:  Carollee Herter, DO  Relative Name and Phone Number:  Royetta Car (Daughter)  [phone]    Current Level of Care: Hospital Recommended Level of Care: Skilled Nursing Facility Prior Approval Number:    Date Approved/Denied:   PASRR Number: [id number]  Discharge Plan: Home    Current Diagnoses: Patient Active Problem List   Diagnosis Date Noted   Hypomagnesemia 04/17/2023   Sepsis with acute organ dysfunction (HCC) 04/16/2023   CKD stage 3a, GFR 45-59 ml/min (HCC) - baseline SCr 1.5 04/16/2023   Acute urinary retention 04/16/2023   Acute cholecystitis - s/p cholecystostomy drain placement on 04-16-2023. 04/14/2023   Thrombocytopenia (HCC) 04/14/2023   Abnormal CT of the abdomen 04/14/2023   Lactic acidosis 04/14/2023   Anemia due to chronic blood loss 10/31/2021   Esophageal stricture 10/04/2021   Hypotension 10/01/2021   History of CVA (cerebrovascular accident) 10/01/2021   Chronic hyponatremia - Baseline around 128 10/01/2021   Stroke (HCC) 06/02/2021   Renal mass 05/23/2021   Aortic atherosclerosis (HCC) 04/10/2021   Atrial fibrillation, permanent (HCC) 11/24/2014   DM2 (diabetes mellitus, type 2) (HCC) 09/29/2014   Essential hypertension 09/29/2014   Dyslipidemia associated with type 2 diabetes mellitus (HCC) 09/29/2014    Orientation RESPIRATION BLADDER Height & Weight     Self, Time, Situation, Place  Normal Incontinent, Indwelling catheter Weight: 173 lb (78.5 kg) Height:  5\' 10"  (177.8 cm)  BEHAVIORAL SYMPTOMS/MOOD NEUROLOGICAL BOWEL NUTRITION STATUS       Continent Diet (see discharge summary)  AMBULATORY STATUS COMMUNICATION OF NEEDS Skin   Extensive Assist Verbally Normal, Other (Comment) (Closed System Drain 1 Right;Superior;Lateral Abdomen Other (Comment) 10.2 Fr.)                       Personal Care Assistance Level of Assistance  Bathing, Feeding, Dressing Bathing Assistance: Maximum assistance Feeding assistance: Independent Dressing Assistance: Maximum assistance     Functional Limitations Info  Sight, Hearing, Speech Sight Info: Impaired (wears glasses) Hearing Info: Impaired Speech Info: Adequate    SPECIAL CARE FACTORS FREQUENCY  PT (By licensed PT), OT (By licensed OT) (Closed System Drain 1 Right;Superior;Lateral Abdomen Other (Comment) 10.2 Fr.)     PT Frequency: 5x/week OT Frequency: 5x/week            Contractures Contractures Info: Not present    Additional Factors Info  Code Status, Allergies Code Status Info: FULL Allergies Info: No Known Allergies           Current Medications (04/17/2023):  This is the current hospital active medication list Current Facility-Administered Medications  Medication Dose Route Frequency Provider Last Rate Last Admin   acetaminophen (TYLENOL) tablet 650 mg  650 mg Oral Q6H PRN Darlin Drop, DO   650 mg at 04/15/23 0607   amoxicillin-clavulanate (AUGMENTIN) 875-125 MG per tablet 1 tablet  1 tablet Oral Q12H Carollee Herter, DO   1 tablet at 04/17/23 1020   Chlorhexidine Gluconate Cloth 2 % PADS 6 each  6 each Topical Daily Carollee Herter, DO   6 each at 04/17/23 1022   diltiazem (CARDIZEM  CD) 24 hr capsule 240 mg  240 mg Oral Daily Carollee Herter, DO   240 mg at 04/17/23 1020   feeding supplement (ENSURE ENLIVE / ENSURE PLUS) liquid 237 mL  237 mL Oral BID BM Gillis Santa, MD       fenofibrate tablet 160 mg  160 mg Oral Daily Tu, Ching T, DO   160 mg at 04/17/23 1020   finasteride (PROSCAR) tablet 5 mg  5 mg Oral Daily Gillis Santa, MD   5 mg at 04/17/23 1019   folic acid  (FOLVITE) tablet 1 mg  1 mg Oral Daily Carollee Herter, DO   1 mg at 04/17/23 1019   gabapentin (NEURONTIN) capsule 300 mg  300 mg Oral TID Gillis Santa, MD   300 mg at 04/17/23 1608   insulin aspart (novoLOG) injection 0-9 Units  0-9 Units Subcutaneous TID WC Tu, Ching T, DO   1 Units at 04/17/23 1213   LORazepam (ATIVAN) tablet 1-4 mg  1-4 mg Oral Q1H PRN Carollee Herter, DO       Or   LORazepam (ATIVAN) tablet 1 mg  1 mg Oral Q4H PRN Carollee Herter, DO       Lotilaner 0.25 % SOLN 1 drop  1 drop Both Eyes BID Tu, Ching T, DO   1 drop at 04/17/23 1023   melatonin tablet 5 mg  5 mg Oral QHS PRN [name] N, DO   5 mg at 04/16/23 2117   metoprolol succinate (TOPROL-XL) 24 hr tablet 100 mg  100 mg Oral Daily John Giovanni, MD   100 mg at 04/17/23 1019   morphine (PF) 2 MG/ML injection 2 mg  2 mg Intravenous Q3H PRN Tu, Ching T, DO   2 mg at 04/17/23 1232   multivitamin with minerals tablet 1 tablet  1 tablet Oral Daily Carollee Herter, DO   1 tablet at 04/17/23 1020   omega-3 acid ethyl esters (LOVAZA) capsule 1 g  1 g Oral Daily Tu, Ching T, DO   1 g at 04/17/23 1020   pantoprazole (PROTONIX) EC tablet 40 mg  40 mg Oral QAC breakfast Tu, Ching T, DO   40 mg at 04/17/23 1019   polyethylene glycol (MIRALAX / GLYCOLAX) packet 17 g  17 g Oral Daily PRN [name] N, DO       prochlorperazine (COMPAZINE) injection 5 mg  5 mg Intravenous Q6H PRN Hall, Carole N, DO       rosuvastatin (CRESTOR) tablet 40 mg  40 mg Oral Daily Tu, Ching T, DO   40 mg at 04/17/23 1020   saccharomyces boulardii (FLORASTOR) capsule 250 mg  250 mg Oral BID Carollee Herter, DO   250 mg at 04/17/23 1019   sodium chloride flush (NS) 0.9 % injection 5 mL  5 mL Intracatheter Q8H Simonne Come, MD   5 mL at 04/17/23 1339   tamsulosin (FLOMAX) capsule 0.4 mg  0.4 mg Oral QPC supper Gillis Santa, MD   0.4 mg at 04/16/23 1731   thiamine (VITAMIN B1) tablet 100 mg  100 mg Oral Daily Carollee Herter, DO   100 mg at 04/17/23 1019   Or   thiamine (VITAMIN  B1) injection 100 mg  100 mg Intravenous Daily Carollee Herter, DO         Discharge Medications: Please see discharge summary for a list of discharge medications.  Relevant Imaging Results:  Relevant Lab Results:   Additional Information SSN:   [id number]  Adrina Armijo A [place],  LCSWA

## 2023-04-17 NOTE — Progress Notes (Addendum)
Referring Physician(s): Dr Darnelle Spangle  Supervising Physician: Richarda Overlie  Patient Status:  Angel Shelton - In-pt  Chief Complaint:  Cholecystitis   Subjective:  IR procedure yesterday:  Technically successful [place] and Fluoro guided placement of a 10 Fr drainage catheter placement into the gallbladder lumen.   Pt feeling better In great spirits Alert/NAD  Allergies: Patient has no known allergies.  Medications: Prior to Admission medications   Medication Sig Start Date End Date Taking? Authorizing Provider  Cyanocobalamin (VITAMIN B-12 PO) Take 1 tablet by mouth at bedtime.   Yes [provider]  diltiazem (CARDIZEM CD) 240 MG 24 hr capsule Take 240 mg by mouth daily. 03/28/21  Yes [provider]  fenofibrate 160 MG tablet Take 160 mg by mouth daily.   Yes [provider]  gabapentin (NEURONTIN) 600 MG tablet Take 600 mg by mouth 3 (three) times daily.   Yes [provider]  JARDIANCE 10 MG TABS tablet Take 10 mg by mouth daily. 04/25/22  Yes [provider]  Lactobacillus-Inulin (PROBIOTIC DIGESTIVE SUPPORT PO) Take 1 tablet by mouth daily.   Yes [provider]  metFORMIN (GLUCOPHAGE) 1000 MG tablet Take 1,000 mg by mouth 2 (two) times daily. 09/09/19  Yes [provider]  metoprolol succinate (TOPROL-XL) 100 MG 24 hr tablet Take 100 mg by mouth daily. 09/21/14  Yes [provider]  Omega-3 Fatty Acids (FISH OIL) 1000 MG CAPS Take 1,200 mg by mouth daily.   Yes [provider]  pantoprazole (PROTONIX) 40 MG tablet Take 1 tablet (40 mg total) by mouth daily before breakfast. 09/30/22  Yes Danis, Starr [place] III, MD  rosuvastatin (CRESTOR) 40 MG tablet Take 40 mg by mouth daily. 10/01/21  Yes [provider]  sildenafil (VIAGRA) 100 MG tablet Take 100 mg by mouth daily as needed for erectile dysfunction. 08/26/21  Yes [provider]  STUDY - ASPIRE - apixaban 5 mg or placebo tablet (PI-Sethi) Take 5  mg by mouth 2 (two) times daily.   Yes [provider]  TOUJEO SOLOSTAR 300 UNIT/ML Solostar Pen Inject 10 Units into the skin daily.   Yes [provider]  Wheat Dextrin (BENEFIBER DRINK MIX) PACK Take 1 Package by mouth daily.   Yes [provider]  XDEMVY 0.25 % SOLN Place 1 drop into both eyes in the morning and at bedtime. 03/25/23  Yes [provider]  cyclobenzaprine (FLEXERIL) 10 MG tablet Take 10 mg by mouth every 8 (eight) hours as needed for muscle spasms. Patient not taking: Reported on 04/15/2023 08/29/22   [provider]  gabapentin (NEURONTIN) 300 MG capsule Take 300 mg by mouth See admin instructions. Take 600mg  (2 capsules) by mouth every morning, and 300mg  (1 capsule) in the evening. Patient not taking: Reported on 04/15/2023 04/02/21   [provider]     Vital Signs: BP 105/66 (BP Location: Left Arm)   Pulse 97   Temp 97.9 F (36.6 C) (Oral)   Resp 18   Ht 5\' 10"  (1.778 m)   Wt 173 lb (78.5 kg)   SpO2 94%   BMI 24.82 kg/m   Physical Exam Vitals reviewed.  Skin:    General: Skin is warm.     Comments: Site is clean and dry NT No bleeding OP bile Flushes and aspirates easily  Neurological:     Mental Status: He is alert.     Imaging: IR Perc Cholecystostomy  Result Date: 04/16/2023 INDICATION: Acute cholecystitis. Poor  operative candidate. Presents for image guided cholecystostomy tube placement for infection source control purposes. EXAM: ULTRASOUND AND FLUOROSCOPIC-GUIDED CHOLECYSTOSTOMY TUBE PLACEMENT COMPARISON:  None Available. MEDICATIONS: The patient is currently admitted to the hospital and on intravenous antibiotics. Antibiotics were administered within an appropriate time frame prior to skin puncture. ANESTHESIA/SEDATION: Moderate (conscious) sedation was employed during this procedure. A total of Versed mg and Fentanyl mcg was administered intravenously. Moderate Sedation Time: minutes. The  patient's level of consciousness and vital signs were monitored continuously by radiology nursing throughout the procedure under my direct supervision. CONTRAST:  15mL OMNIPAQUE IOHEXOL 300 MG/ML SOLN - administered into the gallbladder fossa. FLUOROSCOPY TIME:  minutes  seconds ( mGy) COMPLICATIONS: None immediate. PROCEDURE: Informed written consent was obtained from the patient after a discussion of the risks, benefits and alternatives to treatment. Questions regarding the procedure were encouraged and answered. A timeout was performed prior to the initiation of the procedure. The right upper abdominal quadrant was prepped and draped in the usual sterile fashion, and a sterile drape was applied covering the operative field. Maximum barrier sterile technique with sterile gowns and gloves were used for the procedure. A timeout was performed prior to the initiation of the procedure. Local anesthesia was provided with 1% lidocaine with epinephrine. Ultrasound scanning of the right upper quadrant demonstrates a markedly dilated gallbladder. Of note, the patient reported pain with ultrasound imaging over the gallbladder. Utilizing a transhepatic approach, a 22 gauge needle was advanced into the gallbladder under direct ultrasound guidance. An ultrasound image was saved for documentation purposes. Appropriate intraluminal puncture was confirmed with the efflux of bile and advancement of an 0.018 wire into the gallbladder lumen. The needle was exchanged for an Accustick set. A small amount of contrast was injected to confirm appropriate intraluminal positioning. Over a Benson wire, a 10.2-French Cook cholecystomy tube was advanced into the gallbladder fossa, coiled and locked. A small amount foul-smelling bile was aspirated, capped and sent to the laboratory for analysis. A small amount of contrast was injected as several post procedural spot radiographic images were obtained in various obliquities. The catheter was  secured to the skin with suture, connected to a drainage bag and a dressing was applied. The patient tolerated the procedure well without immediate post procedural complication. IMPRESSION: Successful ultrasound and fluoroscopic guided placement of a 10.2 French cholecystostomy tube. Electronically Signed   By: Simonne Come M.D.   On: 04/16/2023 13:55   US Abdomen Limited RUQ (LIVER/GB)  Result Date: 04/14/2023 CLINICAL DATA:  Abdominal pain EXAM: ULTRASOUND ABDOMEN LIMITED RIGHT UPPER QUADRANT COMPARISON:  CT today. FINDINGS: Gallbladder: There is marked gallbladder wall thickening measuring up to 9 mm with pericholecystic fluid and tenderness over the gallbladder during the study compatible with acute cholecystitis. Sludge and stones within the gallbladder. Common bile duct: Diameter: Normal caliber, 5 mm Liver: No focal lesion identified. Within normal limits in parenchymal echogenicity. Portal vein is patent on color Doppler imaging with normal direction of blood flow towards the liver. Other: None. IMPRESSION: Cholelithiasis with evidence of acute cholecystitis. Electronically Signed   By: Charlett Nose M.D.   On: 04/14/2023 22:19   CT ABDOMEN PELVIS W CONTRAST  Result Date: 04/14/2023 CLINICAL DATA:  LLQ abdominal pain abdominal pain, ?dtic. History of renal cell carcinoma. EXAM: CT ABDOMEN AND PELVIS WITH CONTRAST TECHNIQUE: Multidetector CT imaging of the abdomen and pelvis was performed using the standard protocol following bolus administration of intravenous contrast. RADIATION DOSE REDUCTION: This exam was performed according to the  departmental dose-optimization program which includes automated exposure control, adjustment of the mA and/or kV according to patient size and/or use of iterative reconstruction technique. CONTRAST:  60mL OMNIPAQUE IOHEXOL 350 MG/ML SOLN COMPARISON:  CT abdomen pelvis 09/30/2004, ultrasound abdomen 10/02/2021, MRI abdomen 05/22/2020, CT abdomen 03/21/2021 FINDINGS:  Lower chest: Tiny hiatal hernia.  No acute abnormality. Hepatobiliary: No focal liver abnormality. Calcified gallstone noted within the gallbladder lumen. Associated marked gallbladder wall thickening or pericholecystic fluid. A 1.6 cm gallstone likely impacted within the gallbladder neck. No biliary dilatation. Pancreas: No focal lesion. Normal pancreatic contour. No surrounding inflammatory changes. No main pancreatic ductal dilatation. Spleen: Normal in size without focal abnormality. Adrenals/Urinary Tract: No adrenal nodule bilaterally. Bilateral kidneys enhance symmetrically.  Partial right nephrectomy. No hydronephrosis. No hydroureter. Left nephrolithiasis measuring to 7 mm. Punctate right nephrolithiasis. No ureterolithiasis bilaterally. Stable in size fluid density lesions within the kidneys likely represent simple renal cysts. Simple renal cysts, in the absence of clinically indicated signs/symptoms, require no independent follow-up. The urinary bladder is distended with urine with mild trabeculations noted. On delayed imaging, there is no urothelial wall thickening and there are no filling defects in the opacified portions of the bilateral collecting systems or ureters. Stomach/Bowel: Incidentally noted possible thinning of the gastric fundal wall (3:6). The stomach is otherwise within normal limits. No evidence of small bowel wall thickening or dilatation. Colonic diverticulosis. Slightly irregular bowel wall thickening of the hepatic flexure along the a focal diverticula (8:53). Mild bowel wall thickening of the ascending colon. Right appendix appears normal. Vascular/Lymphatic: No abdominal aorta or iliac aneurysm. Severe atherosclerotic plaque of the aorta and its branches. No abdominal, pelvic, or inguinal lymphadenopathy. Reproductive: Prostate is prominent in size. Other: Right upper quadrant mesenteric fat stranding and trace free fluid. No intraperitoneal free gas. No organized fluid collection.  Musculoskeletal: No abdominal wall hernia or abnormality. No suspicious lytic or blastic osseous lesions. No acute displaced fracture. Multilevel degenerative changes of the spine. Intervertebral disc space vacuum phenomenon at the L3-L4 and L4-L5 levels. IMPRESSION: 1. Cholelithiasis with acute cholecystitis. Associated 1.6 cm gallstone likely impacted within the gallbladder neck. No CT evidence of choledocholithiasis. Recommend surgical consultation. 2. Bowel thickening of the hepatic flexure and mild bowel wall thickening of the ascending colon in the setting of colonic diverticulosis. Finding likely reactive in etiology. Given slight focal irregular bowel thickening along the colonic diverticula, differential diagnosis includes much less likely concurrent acute diverticulitis. Consider colonoscopy status post treatment and status post complete resolution of inflammatory changes to exclude an underlying lesion. 3. Incidentally noted possible thinning of the gastric fundal wall. Limited evaluation due to under distension of the stomach. Recommend correlation with signs and symptoms of gastric ulceration. Consider outpatient endoscopy for direct visualization if clinically indicated. 4. Nonobstructive bilateral nephrolithiasis measuring up to 7 mm on the left and punctate on the right. 5. Status post partial right nephrectomy. 6. Prostate is prominent in size with associated urinary bladder distension with urine and mild urinary bladder wall trabeculations. Correlate with signs and symptoms of obstructive uropathy. 7.  Aortic Atherosclerosis (ICD10-I70.0)-severe. Electronically Signed   By: Tish Frederickson M.D.   On: 04/14/2023 20:12   DG Chest Portable 1 View  Result Date: 04/14/2023 CLINICAL DATA:  Fall. EXAM: PORTABLE CHEST 1 VIEW COMPARISON:  Chest radiograph dated March 18, 2022. CT chest dated May 28, 2022. FINDINGS: Patient is rotated to the right. The heart size and mediastinal contours are  otherwise within normal limits. Aortic atherosclerosis. Coarse interstitial markings.  No focal consolidation, sizeable pleural effusion, or pneumothorax. Remote bilateral healed rib fractures. No acute osseous abnormality. IMPRESSION: 1. No acute findings in the chest. 2. Emphysema. Electronically Signed   By: Hart Robinsons M.D.   On: 04/14/2023 17:18    Labs:  CBC: Recent Labs    04/14/23 1423 04/15/23 0654 04/16/23 0626  WBC 14.7* 10.0 8.8  HGB 13.5 12.2* 11.8*  HCT 40.0 34.5* 33.9*  PLT 115* 102* 95*    COAGS: Recent Labs    04/15/23 1040 04/16/23 0626  INR 1.6* 1.6*    BMP: Recent Labs    04/14/23 1535 04/15/23 0654 04/16/23 0626  NA 128* 128* 126*  K 3.5 3.4* 3.5  CL 97* 97* 96*  CO2 18* 21* 21*  GLUCOSE 70 94 90  BUN 31* 31* 23  CALCIUM 8.4* 9.3 9.4  CREATININE 2.04* 2.10* 1.75*  GFRNONAA 35* 34* 42*    LIVER FUNCTION TESTS: Recent Labs    04/14/23 1535 04/15/23 1040 04/16/23 0626  BILITOT 2.9* 1.3* 1.2*  AST 35 51* 42*  ALT 20 23 21   ALKPHOS 39 55 49  PROT 4.7* 5.3* 4.8*  ALBUMIN 2.2* 2.2* 1.9*   Drain Location: RUQ Size: Fr size: 10 Fr Date of placement: 11/20  Currently to: Drain collection device: gravity 24 hour output:  Output by Drain (mL) 04/15/23 0701 - 04/15/23 1900 04/15/23 1901 - 04/16/23 0700 04/16/23 0701 - 04/16/23 1900 04/16/23 1901 - 04/17/23 1610  Closed System Drain 1 Right;Superior;Lateral Abdomen Other (Comment) 10.2 Fr.   100 220    Interval imaging/drain manipulation:  none  Current examination: Flushes/aspirates easily.  Insertion site unremarkable. Suture and stat lock in place. Dressed appropriately.  OP bile  Plan: Continue TID flushes with 5 cc NS. Record output Q shift. Dressing changes QD or PRN if soiled.  Call IR APP or on call IR MD if difficulty flushing or sudden change in drain output.    Discharge planning: Please contact IR APP or on call IR MD prior to patient d/c to ensure appropriate  follow up plans are in place. Pt will hear from OP IR scheduler for OP follow up 6-8 weeks for injection/imaging-- unless goes to OR  IR will continue to follow - please call with questions or concerns.  Assessment: Percutaneous cholecystostomy drain placed in IR 11/20 IR will follow Pt will hear from OP IR scheduler for time and date of follow up appt Pt to flush once daily at home and record output---- please DC with Rx for flushes  Electronically Signed: Robet Leu, PA-C 04/17/2023, 6:23 AM   I spent a total of 15 Minutes at the the patient's bedside AND on the patient's hospital floor or unit, greater than 50% of which was counseling/coordinating care for Perc chole drain

## 2023-04-17 NOTE — Progress Notes (Signed)
Subjective/Chief Complaint: Feels much better,taking clears   Objective: Vital signs in last 24 hours: Temp:  [97.9 F (36.6 C)-98.8 F (37.1 C)] 97.9 F (36.6 C) (11/21 0425) Pulse Rate:  [91-135] 97 (11/21 0425) Resp:  [14-19] 18 (11/21 0425) BP: (97-129)/(56-84) 105/66 (11/21 0425) SpO2:  [92 %-100 %] 94 % (11/21 0425) Last BM Date : 04/14/23  Intake/Output from previous day: 11/20 0701 - 11/21 0700 In: 100 [IV Piggyback:100] Out: 1820 [Urine:1500; Drains:320] Intake/Output this shift: No intake/output data recorded.  Ab soft nontender drain functional  Lab Results:  Recent Labs    04/16/23 0626 04/17/23 0554  WBC 8.8 8.7  HGB 11.8* 11.5*  HCT 33.9* 32.6*  PLT 95* 113*   BMET Recent Labs    04/15/23 0654 04/16/23 0626  NA 128* 126*  K 3.4* 3.5  CL 97* 96*  CO2 21* 21*  GLUCOSE 94 90  BUN 31* 23  CREATININE 2.10* 1.75*  CALCIUM 9.3 9.4   PT/INR Recent Labs    04/16/23 0626 04/17/23 0554  LABPROT 19.3* 17.1*  INR 1.6* 1.4*   ABG No results for input(s): "PHART", "HCO3" in the last 72 hours.  Invalid input(s): "PCO2", "PO2"  Studies/Results: IR Perc Cholecystostomy  Result Date: 04/16/2023 INDICATION: Acute cholecystitis. Poor operative candidate. Presents for image guided cholecystostomy tube placement for infection source control purposes. EXAM: ULTRASOUND AND FLUOROSCOPIC-GUIDED CHOLECYSTOSTOMY TUBE PLACEMENT COMPARISON:  None Available. MEDICATIONS: The patient is currently admitted to the hospital and on intravenous antibiotics. Antibiotics were administered within an appropriate time frame prior to skin puncture. ANESTHESIA/SEDATION: Moderate (conscious) sedation was employed during this procedure. A total of Versed mg and Fentanyl mcg was administered intravenously. Moderate Sedation Time: minutes. The patient's level of consciousness and vital signs were monitored continuously by radiology nursing throughout the procedure under my direct  supervision. CONTRAST:  15mL OMNIPAQUE IOHEXOL 300 MG/ML SOLN - administered into the gallbladder fossa. FLUOROSCOPY TIME:  minutes  seconds ( mGy) COMPLICATIONS: None immediate. PROCEDURE: Informed written consent was obtained from the patient after a discussion of the risks, benefits and alternatives to treatment. Questions regarding the procedure were encouraged and answered. A timeout was performed prior to the initiation of the procedure. The right upper abdominal quadrant was prepped and draped in the usual sterile fashion, and a sterile drape was applied covering the operative field. Maximum barrier sterile technique with sterile gowns and gloves were used for the procedure. A timeout was performed prior to the initiation of the procedure. Local anesthesia was provided with 1% lidocaine with epinephrine. Ultrasound scanning of the right upper quadrant demonstrates a markedly dilated gallbladder. Of note, the patient reported pain with ultrasound imaging over the gallbladder. Utilizing a transhepatic approach, a 22 gauge needle was advanced into the gallbladder under direct ultrasound guidance. An ultrasound image was saved for documentation purposes. Appropriate intraluminal puncture was confirmed with the efflux of bile and advancement of an 0.018 wire into the gallbladder lumen. The needle was exchanged for an Accustick set. A small amount of contrast was injected to confirm appropriate intraluminal positioning. Over a Benson wire, a 10.2-French Cook cholecystomy tube was advanced into the gallbladder fossa, coiled and locked. A small amount foul-smelling bile was aspirated, capped and sent to the laboratory for analysis. A small amount of contrast was injected as several post procedural spot radiographic images were obtained in various obliquities. The catheter was secured to the skin with suture, connected to a drainage bag and a dressing was applied. The patient tolerated  the procedure well without  immediate post procedural complication. IMPRESSION: Successful ultrasound and fluoroscopic guided placement of a 10.2 French cholecystostomy tube. Electronically Signed   By: Simonne Come M.D.   On: 04/16/2023 13:55    Anti-infectives: Anti-infectives (From admission, onward)    Start     Dose/Rate Route Frequency Ordered Stop   04/15/23 1800  cefTRIAXone (ROCEPHIN) 2 g in sodium chloride 0.9 % 100 mL IVPB        2 g 200 mL/hr over 30 Minutes Intravenous Every 24 hours 04/14/23 2341     04/15/23 0800  metroNIDAZOLE (FLAGYL) IVPB 500 mg        500 mg 100 mL/hr over 60 Minutes Intravenous 2 times daily 04/14/23 2345     04/14/23 2200  metroNIDAZOLE (FLAGYL) IVPB 500 mg  Status:  Discontinued        500 mg 100 mL/hr over 60 Minutes Intravenous 2 times daily 04/14/23 2341 04/14/23 2345   04/14/23 1645  cefTRIAXone (ROCEPHIN) 2 g in sodium chloride 0.9 % 100 mL IVPB       Placed in "And" Linked Group   2 g 200 mL/hr over 30 Minutes Intravenous  Once 04/14/23 1630 04/14/23 1805   04/14/23 1645  metroNIDAZOLE (FLAGYL) IVPB 500 mg       Placed in "And" Linked Group   500 mg 100 mL/hr over 60 Minutes Intravenous  Once 04/14/23 1630 04/14/23 1913       Assessment/Plan: Cholecystitis -perc chole in, appreciate IR assistance -can adat from my standpoint -abx course - I will see back after drain study to discuss surgery   Angel Shelton 04/17/2023

## 2023-04-17 NOTE — Progress Notes (Addendum)
PROGRESS NOTE    HURSCHEL ISHMAEL  WUJ:[id number] DOB: 12/22/1955 DOA: 04/14/2023 PCP: Cleatis Polka., MD  Subjective: Pt seen and examined.  Pt did well with cholecystostomy drain placement. I talked with pt's dtr yesterday. 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 Dtr is a Publishing rights manager.  Last EF 05-2021 showed normal LVEF 60%. LFTs were not elevated on admission.  Pt placed on CIWA protocol.  General surgery is ok with advancing pt's diet.   Hospital Course: HPI: RAYMONT LUBRANO is a 67 y.o. male with medical history significant of HTN, atrial fibrillation on Eliquis, CVA, type 2 diabetes, history of renal mass s/p right partial nephrectomy who presents with abdominal pain.   Patient reports left lower quadrant abdominal pain that has radiated to his right abdomen since last week.  Has been weak and mostly been staying in bed.  Had decreased oral intake.  However has still been compliant with his medication including his antihypertensives.  He denies any nausea, vomiting or diarrhea.  Patient is on Eliquis for atrial fibrillation and took last dose today at 1 PM.   On arrival to ED, he was afebrile, BP of 70/58 on room air.   CBC with leukocytosis of 14.7, hemoglobin of 13.5, thrombocytopenia of 115.  Lactate initially elevated at 2.2   CMP with hyponatremia of 128, chloride of 97, CO2 of 18 with normal anion gap of 13 although trending up from prior.  Creatinine also elevated at 2.04 with unclear baseline. LFTs are within normal limits but with hyperbilirubinemia 2.9.  Lipase within normal limits.   CT abdomen chest with calcified gallstone noted within gallbladder lumen and another likely impacted gallstone within the gallbladder neck.  Findings concerning for acute cholecystitis.  There is also nonobstructive bilateral nephrolithiasis. Acute cholecystitis was confirmed on right upper  quadrant ultrasound.   General surgery was consulted and recommend keeping n.p.o. at midnight for surgical intervention in the morning.  He was started on IV Rocephin and Flagyl in the ED as well as given 3 L of normal saline bolus fluids.  Hospitalist consulted for admission.  Significant Events: Admitted 04/14/2023 for acute cholecystitis   Significant Labs: Admission WBC 14.7, lactic acid 2.2, Na 128  Significant Imaging Studies: Admission CT abd shows Cholelithiasis with acute cholecystitis. Associated 1.6 cm gallstone likely impacted within the gallbladder neck. No CT evidence of choledocholithiasis. Recommend surgical consultation. 2. Bowel thickening of the hepatic flexure and mild bowel wall thickening of the ascending colon in the setting of colonic diverticulosis. Finding likely reactive in etiology. Given slight focal irregular bowel thickening along the colonic diverticula, differential diagnosis includes much less likely concurrent acute diverticulitis. Consider colonoscopy status post treatment and status post complete resolution of inflammatory changes to exclude an underlying lesion. 3. Incidentally noted possible thinning of the gastric fundal wall. Limited evaluation due to under distension of the stomach. Recommend correlation with signs and symptoms of gastric ulceration. Consider outpatient endoscopy for direct visualization if clinically indicated. 4. Nonobstructive bilateral nephrolithiasis measuring up to 7 mm on the left and punctate on the right. 5. Status post partial right nephrectomy. 6. Prostate is prominent in size with associated urinary bladder distension with urine and mild urinary bladder wall trabeculations. Correlate with signs and symptoms of obstructive uropathy. 7.  Aortic Atherosclerosis (ICD10-I70.0)-severe. Admission RUQ U/S shows Cholelithiasis with evidence of acute cholecystitis   Antibiotic Therapy: Anti-infectives (From admission,  onward)    Start      Dose/Rate Route Frequency Ordered Stop   04/15/23 1800  cefTRIAXone (ROCEPHIN) 2 g in sodium chloride 0.9 % 100 mL IVPB        2 g 200 mL/hr over 30 Minutes Intravenous Every 24 hours 04/14/23 2341     04/15/23 0800  metroNIDAZOLE (FLAGYL) IVPB 500 mg        500 mg 100 mL/hr over 60 Minutes Intravenous 2 times daily 04/14/23 2345     04/14/23 2200  metroNIDAZOLE (FLAGYL) IVPB 500 mg  Status:  Discontinued        500 mg 100 mL/hr over 60 Minutes Intravenous 2 times daily 04/14/23 2341 04/14/23 2345   04/14/23 1645  cefTRIAXone (ROCEPHIN) 2 g in sodium chloride 0.9 % 100 mL IVPB       Placed in "And" Linked Group   2 g 200 mL/hr over 30 Minutes Intravenous  Once 04/14/23 1630 04/14/23 1805   04/14/23 1645  metroNIDAZOLE (FLAGYL) IVPB 500 mg       Placed in "And" Linked Group   500 mg 100 mL/hr over 60 Minutes Intravenous  Once 04/14/23 1630 04/14/23 1913       Procedures:   Consultants: General Surgery IR    Assessment and Plan: * Acute cholecystitis - s/p cholecystostomy drain placement on 04-16-2023. 04-14-2023  CT imaging with calcified gallstone noted within gallbladder lumen and another likely impacted gallstone within the gallbladder neck.  -General surgery consulted. Pt last took Eliquis at 1pm today. Will awaiting general surgery recommendation for timing of acute cholecystectomy.  Will keep n.p.o. past midnight. -Continue IV Rocephin and Flagyl  04-16-2023 general surgery has requested IR place cholecystostomy tube today. Continue IV ABX with rocephin, flagyl  04-17-2023 will change to po abx (augmentin). IR will contact patient to schedule outpatient imaging in 6 weeks. Will ask surgery if Eliquis can be restarted. I see no contraindications. Pt tolerated clear liquids for breakfast. Will advance to low fat diet.  Sepsis with acute organ dysfunction (HCC) 04-16-2023 present on admission. WBC 14.7, lactic acid 2.2, acute cholecystitis seen on CT abd 04-17-2023 resolved.  WBC down to 8.7. no fevers.   Hypotension 04-14-2023 BP of 70/58 on presentation. Now normotensive with 3L of NS bolus -Continue to hold antihypertensives overnight -Can resume lisinopril-HCTZ, diltiazem, metoprolol and Jardiance in the morning  04-16-2023 resolved. Continue to hold antihypertensives.  Acute urinary retention 04-16-2023 continue with foley catheter. Continue flomax and proscar.  CKD stage 3a, GFR 45-59 ml/min (HCC) - baseline SCr 1.5 04-16-2023 continue IVF while NPO.  04-17-2023 Scr back down to 1.55. which is his baseline.  Lactic acidosis 04-14-2023 -Secondary to dehydration -has received 3L NS bolus  04-16-2023 resolved.  Abnormal CT of the abdomen 04-14-2023- CT a/p with Bowel thickening of the hepatic flexure and mild bowel wall thickening of the ascending colon in the setting of colonic diverticulosis. Finding likely reactive in  etiology. Given slight focal irregular bowel thickening along the colonic diverticula, differential diagnosis includes much less likely concurrent acute diverticulitis. Consider colonoscopy  status post treatment and status post complete resolution of inflammatory changes to exclude an underlying lesion. -there was also findings of possible thickening of the gastric fundal wall but patient has no signs of symptoms of gastric ulceration  04-16-2023 - will need outpatient GI referral for EGD/colonoscopy.  Thrombocytopenia (HCC) 04-14-2023 -likely reactive  04-16-2023 stable @ 95K  04-17-2023 improved. Plt cnt 113K today  Chronic hyponatremia - Baseline around  128 04-14-2023 -due to dehydration. -Received 3 L of normal saline bolus. - Follow repeat in the morning  04-16-2023 chronic. Baseline around 128  04-17-2023 Na 124. Likely due to low solute intake last 24-48 hours. Pt placed back on low fat diet today.  History of CVA (cerebrovascular accident) 04-14-2023 -has already taken dose of aspirin today. Hold pending possible  surgery tomorrow  04-16-2023 stable.  Atrial fibrillation, permanent (HCC) 04-14-2023  Resume diltiazem and metoprolol tomorrow due to hypotension on presentation  04-16-2023 stable. Continue to hold cardizem and lopressor due to normal BP.  04-17-2023 had some fast HR last night. Cardizem-CD and Toprol-XL restarted last night. Will continue and monitor pt's HR. Keep K >4 and Mg >2.0. give po kcl and IV mag today. Restarting eliquis today.  Dyslipidemia associated with type 2 diabetes mellitus (HCC) 04-14-2023 Continue statin and fish oil  04-16-2023 continue crestor 40 mg every day, lovaza every day, fenofibrate 160 mg qd  DM2 (diabetes mellitus, type 2) (HCC) 04-14-2023  A1c last year of 6.2.  No hypoglycemia on presentation. -Holding Jardiance due to hypotension -on 10 unit of Toujeo at home -place on SSI  04-16-2023 continue SSI.  Hypomagnesemia 04-17-2023 Mg 1.6 today. Will give 2 grams IV mag  DVT prophylaxis: SCDs Start: 04/14/23 2319     Code Status: Full Code Family Communication: no family at bedside. Talked with pt's dtr stephanie via phone on 04-16-2023. Disposition Plan: return home Reason for continuing need for hospitalization: needs teaching on how to flush cholecystostomy drain. Changing to po abx. Monitoring for etoh withdrawals.  Objective: Vitals:   04/16/23 2010 04/16/23 2100 04/17/23 0425 04/17/23 0810  BP: (!) 114/56  105/66 112/61  Pulse: (!) 135  97 (!) 105  Resp: 17  18   Temp:  98.8 F (37.1 C) 97.9 F (36.6 C)   TempSrc:   Oral   SpO2: 96%  94% 94%  Weight:      Height:        Intake/Output Summary (Last 24 hours) at 04/17/2023 1428 Last data filed at 04/17/2023 1237 Gross per 24 hour  Intake 100 ml  Output 3100 ml  Net -3000 ml   Filed Weights   04/15/23 0122 04/16/23 0503  Weight: 77.2 kg 78.5 kg    Examination:  Physical Exam Vitals and nursing note reviewed.  Constitutional:      General: He is not in acute distress.     Appearance: He is not toxic-appearing or diaphoretic.  HENT:     Head: Normocephalic and atraumatic.     Nose: Nose normal.  Eyes:     General: No scleral icterus. Cardiovascular:     Rate and Rhythm: Normal rate. Rhythm irregular.  Pulmonary:     Effort: Pulmonary effort is normal.     Breath sounds: Normal breath sounds.  Abdominal:     General: Bowel sounds are normal. There is no distension.     Palpations: Abdomen is soft.     Tenderness: There is no abdominal tenderness.  Musculoskeletal:     Right lower leg: No edema.     Left lower leg: No edema.  Skin:    General: Skin is warm and dry.     Capillary Refill: Capillary refill takes less than 2 seconds.  Neurological:     Mental Status: He is alert.     Data Reviewed: I have personally reviewed following labs and imaging studies  CBC: Recent Labs  Lab 04/14/23 1423 04/15/23 0654 04/16/23 4580  04/17/23 0554  WBC 14.7* 10.0 8.8 8.7  HGB 13.5 12.2* 11.8* 11.5*  HCT 40.0 34.5* 33.9* 32.6*  MCV 94.1 92.5 92.1 89.6  PLT 115* 102* 95* 113*   Basic Metabolic Panel: Recent Labs  Lab 04/14/23 1535 04/14/23 2339 04/15/23 0654 04/16/23 0626 04/17/23 0554  NA 128*  --  128* 126* 124*  K 3.5  --  3.4* 3.5 3.7  CL 97*  --  97* 96* 95*  CO2 18*  --  21* 21* 23  GLUCOSE 70  --  94 90 111*  BUN 31*  --  31* 23 22  CREATININE 2.04*  --  2.10* 1.75* 1.55*  CALCIUM 8.4*  --  9.3 9.4 9.7  MG  --  1.6*  --  1.8 1.6*  PHOS  --  3.6  --  1.9* 2.3*   GFR: Estimated Creatinine Clearance: 47.1 mL/min (A) (by C-G formula based on SCr of 1.55 mg/dL (H)). Liver Function Tests: Recent Labs  Lab 04/14/23 1535 04/15/23 1040 04/16/23 0626 04/17/23 0554  AST 35 51* 42* 29  ALT 20 23 21 20   ALKPHOS 39 55 49 46  BILITOT 2.9* 1.3* 1.2* 0.9  PROT 4.7* 5.3* 4.8* 4.5*  ALBUMIN 2.2* 2.2* 1.9* 1.9*   Recent Labs  Lab 04/14/23 1535 04/16/23 0626 04/17/23 0554  LIPASE 51 61* 52*    Coagulation Profile: Recent Labs  Lab  04/15/23 1040 04/16/23 0626 04/17/23 0554  INR 1.6* 1.6* 1.4*   HbA1C: Recent Labs    04/15/23 0654  HGBA1C 7.4*   CBG: Recent Labs  Lab 04/16/23 1330 04/16/23 1755 04/16/23 2139 04/17/23 0857 04/17/23 1143  GLUCAP 104* 91 317* 147* 135*   Sepsis Labs: Recent Labs  Lab 04/14/23 1437 04/14/23 1643  LATICACIDVEN 2.2* 1.6    Recent Results (from the past 240 hour(s))  Aerobic/Anaerobic Culture w Gram Stain (surgical/deep wound)     Status: None (Preliminary result)   Collection Time: 04/16/23 12:23 PM   Specimen: Abscess  Result Value Ref Range Status   Specimen Description ABSCESS  Final   Special Requests NONE  Final   Gram Stain   Final    RARE WBC PRESENT,BOTH PMN AND MONONUCLEAR MODERATE GRAM NEGATIVE RODS RARE GRAM POSITIVE RODS RARE BUDDING YEAST SEEN    Culture   Final    TOO YOUNG TO READ Performed at [facility], [address], [place], [place] [address]    Report Status PENDING  Incomplete     Radiology Studies: IR Perc Cholecystostomy  Result Date: 04/16/2023 INDICATION: Acute cholecystitis. Poor operative candidate. Presents for image guided cholecystostomy tube placement for infection source control purposes. EXAM: ULTRASOUND AND FLUOROSCOPIC-GUIDED CHOLECYSTOSTOMY TUBE PLACEMENT COMPARISON:  None Available. MEDICATIONS: The patient is currently admitted to the hospital and on intravenous antibiotics. Antibiotics were administered within an appropriate time frame prior to skin puncture. ANESTHESIA/SEDATION: Moderate (conscious) sedation was employed during this procedure. A total of Versed mg and Fentanyl mcg was administered intravenously. Moderate Sedation Time: minutes. The patient's level of consciousness and vital signs were monitored continuously by radiology nursing throughout the procedure under my direct supervision. CONTRAST:  15mL OMNIPAQUE IOHEXOL 300 MG/ML SOLN - administered into the gallbladder fossa. FLUOROSCOPY TIME:  minutes   seconds ( mGy) COMPLICATIONS: None immediate. PROCEDURE: Informed written consent was obtained from the patient after a discussion of the risks, benefits and alternatives to treatment. Questions regarding the procedure were encouraged and answered. A timeout was performed prior to the initiation  of the procedure. The right upper abdominal quadrant was prepped and draped in the usual sterile fashion, and a sterile drape was applied covering the operative field. Maximum barrier sterile technique with sterile gowns and gloves were used for the procedure. A timeout was performed prior to the initiation of the procedure. Local anesthesia was provided with 1% lidocaine with epinephrine. Ultrasound scanning of the right upper quadrant demonstrates a markedly dilated gallbladder. Of note, the patient reported pain with ultrasound imaging over the gallbladder. Utilizing a transhepatic approach, a 22 gauge needle was advanced into the gallbladder under direct ultrasound guidance. An ultrasound image was saved for documentation purposes. Appropriate intraluminal puncture was confirmed with the efflux of bile and advancement of an 0.018 wire into the gallbladder lumen. The needle was exchanged for an Accustick set. A small amount of contrast was injected to confirm appropriate intraluminal positioning. Over a Benson wire, a 10.2-French Cook cholecystomy tube was advanced into the gallbladder fossa, coiled and locked. A small amount foul-smelling bile was aspirated, capped and sent to the laboratory for analysis. A small amount of contrast was injected as several post procedural spot radiographic images were obtained in various obliquities. The catheter was secured to the skin with suture, connected to a drainage bag and a dressing was applied. The patient tolerated the procedure well without immediate post procedural complication. IMPRESSION: Successful ultrasound and fluoroscopic guided placement of a 10.2 French  cholecystostomy tube. Electronically Signed   By: Simonne Come M.D.   On: 04/16/2023 13:55    Scheduled Meds:  amoxicillin-clavulanate  1 tablet Oral Q12H   Chlorhexidine Gluconate Cloth  6 each Topical Daily   diltiazem  240 mg Oral Daily   feeding supplement  237 mL Oral BID BM   fenofibrate  160 mg Oral Daily   finasteride  5 mg Oral Daily   folic acid  1 mg Oral Daily   gabapentin  300 mg Oral TID   insulin aspart  0-9 Units Subcutaneous TID WC   Lotilaner  1 drop Both Eyes BID   metoprolol succinate  100 mg Oral Daily   multivitamin with minerals  1 tablet Oral Daily   omega-3 acid ethyl esters  1 g Oral Daily   pantoprazole  40 mg Oral QAC breakfast   rosuvastatin  40 mg Oral Daily   saccharomyces boulardii  250 mg Oral BID   sodium chloride flush  5 mL Intracatheter Q8H   tamsulosin  0.4 mg Oral QPC supper   thiamine  100 mg Oral Daily   Or   thiamine  100 mg Intravenous Daily   Continuous Infusions:     LOS: 3 days   Time spent: 40 minutes  Carollee Herter, DO  [facility]  04/17/2023, 2:28 PM

## 2023-04-17 NOTE — Assessment & Plan Note (Addendum)
04-17-2023 Mg 1.6 today. Will give 2 grams IV mag  04-18-2023 resolved. Mg 1.9

## 2023-04-17 NOTE — Evaluation (Signed)
Physical Therapy Evaluation Patient Details Name: Angel Shelton MRN: [id number] DOB: 1955-10-12 Today's Date: 04/17/2023  History of Present Illness  Angel Shelton is a 67 y.o. malewho presents with abdominal pain.  Cholecystitis and received chole drain.PMH:  HTN, atrial fibrillation on Eliquis, CVA, type 2 diabetes, history of renal mass s/p right partial nephrectomy  Clinical Impression  Pt admitted with above diagnosis. Pt was able to stand at EOB with mod assist however was orthostatic therefore assisted pt back to bed.  Anticipate need for post acute rehab < 3 hours day. Will follow acutely.  Pt currently with functional limitations due to the deficits listed below (see PT Problem List). Pt will benefit from acute skilled PT to increase their independence and safety with mobility to allow discharge.           If plan is discharge home, recommend the following: A lot of help with walking and/or transfers;A little help with bathing/dressing/bathroom;Assistance with cooking/housework;Assist for transportation;Help with stairs or ramp for entrance   Can travel by private vehicle   No    Equipment Recommendations Rolling walker (2 wheels);BSC/3in1  Recommendations for Other Services       Functional Status Assessment Patient has had a recent decline in their functional status and demonstrates the ability to make significant improvements in function in a reasonable and predictable amount of time.     Precautions / Restrictions Precautions Precautions: Fall Restrictions Weight Bearing Restrictions: No      Mobility  Bed Mobility Overal bed mobility: Needs Assistance Bed Mobility: Supine to Sit, Sit to Supine     Supine to sit: Min assist Sit to supine: Min assist   General bed mobility comments: Needed assist for LEs and elevation of trunk with incr time to get balance once EOB    Transfers Overall transfer level: Needs assistance Equipment used: Rolling walker  (2 wheels) Transfers: Sit to/from Stand Sit to Stand: Mod assist           General transfer comment: Mod assist to rise with bed elevated. Once up, posterior leana nd trunk flexion as well.  Could not ambulate and BP low, therefore took a few steps to [facility] and assist back to supine.    Ambulation/Gait                  Stairs            Wheelchair Mobility     Tilt Bed    Modified Rankin (Stroke Patients Only)       Balance                                             Pertinent Vitals/Pain Pain Assessment Pain Assessment: Faces Faces Pain Scale: Hurts whole lot Pain Location: headache Pain Descriptors / Indicators: Aching Pain Intervention(s): Limited activity within patient's tolerance, Monitored during session, Repositioned, Patient requesting pain meds-RN notified    Home Living Family/patient expects to be discharged to:: Private residence Living Arrangements: Alone   Type of Home: House Home Access: Stairs to enter   Secretary/administrator of Steps: 1   Home Layout: One level Home Equipment: None      Prior Function Prior Level of Function : Independent/Modified Independent;Driving (retired)                     Production assistant, radio  Upper Extremity Assessment Upper Extremity Assessment: Defer to OT evaluation    Lower Extremity Assessment Lower Extremity Assessment: Generalized weakness    Cervical / Trunk Assessment Cervical / Trunk Assessment: Kyphotic  Communication   Communication Communication: No apparent difficulties  Cognition Arousal: Alert Behavior During Therapy: WFL for tasks assessed/performed Overall Cognitive Status: Within Functional Limits for tasks assessed                                          General Comments General comments (skin integrity, edema, etc.): 126/67, 97 bpm supine; sitting 116/62, 99 bpm; standing 99/74, 99bpm; once back in bed 120/83, 113  bpm    Exercises     Assessment/Plan    PT Assessment Patient needs continued PT services  PT Problem List Decreased activity tolerance;Decreased balance;Decreased mobility;Decreased knowledge of use of DME;Decreased safety awareness;Decreased knowledge of precautions;Pain       PT Treatment Interventions DME instruction;Gait training;Functional mobility training;Therapeutic activities;Therapeutic exercise;Balance training;Patient/family education    PT Goals (Current goals can be found in the Care Plan section)  Acute Rehab PT Goals Patient Stated Goal: to get better PT Goal Formulation: With patient Time For Goal Achievement: 05/01/23 Potential to Achieve Goals: Good    Frequency Min 1X/week     Co-evaluation               AM-PAC PT "6 Clicks" Mobility  Outcome Measure Help needed turning from your back to your side while in a flat bed without using bedrails?: A Little Help needed moving from lying on your back to sitting on the side of a flat bed without using bedrails?: A Lot Help needed moving to and from a bed to a chair (including a wheelchair)?: A Lot Help needed standing up from a chair using your arms (e.g., wheelchair or bedside chair)?: A Lot Help needed to walk in hospital room?: Total Help needed climbing 3-5 steps with a railing? : Total 6 Click Score: 11    End of Session Equipment Utilized During Treatment: Gait belt Activity Tolerance: Patient limited by fatigue Patient left: in bed;with call bell/phone within reach;with bed alarm set Nurse Communication: Mobility status PT Visit Diagnosis: Unsteadiness on feet (R26.81)    Time: 2725-3664 PT Time Calculation (min) (ACUTE ONLY): 28 min   Charges:   PT Evaluation $PT Eval Moderate Complexity: 1 Mod PT Treatments $Therapeutic Activity: 8-22 mins PT General Charges $$ ACUTE PT VISIT: 1 Visit         [facility] M,PT Acute Rehab Services [phone]   Bevelyn Buckles 04/17/2023, 12:31 PM

## 2023-04-17 NOTE — Care Management Important Message (Signed)
Important Message  Patient Details  Name: Angel Shelton MRN: [id number] Date of Birth: 01-16-1956   Important Message Given:  Yes - Medicare IM     Dorena Bodo 04/17/2023, 3:25 PM

## 2023-04-17 NOTE — Progress Notes (Signed)
Mobility Specialist Progress Note:    04/17/23 0850  Mobility  Activity Dangled on edge of bed  Level of Assistance Minimal assist, patient does 75% or more  Assistive Device None  Activity Response Tolerated well  Mobility Referral Yes  $Mobility charge 1 Mobility  Mobility Specialist Start Time (ACUTE ONLY) 0850  Mobility Specialist Stop Time (ACUTE ONLY) 0905  Mobility Specialist Time Calculation (min) (ACUTE ONLY) 15 min   Pt received in bed, eager for mobility session. MinA required to dangle EOB. Tolerated well, agreeable to lying in bed with HOB raised. Noticed some bleeding near groin area, alerted NT and notified RN. Left pt lying comfortably in bed, call bell in reach, all needs met.   [name] Mobility Specialist Please contact via Special educational needs teacher or  Rehab office at [phone]

## 2023-04-17 NOTE — TOC Initial Note (Signed)
Transition of Care [facility]) - Initial/Assessment Note    Patient Details  Name: Angel Shelton MRN: [id number] Date of Birth: 07-17-1955  [facility]) CM/SW Contact:    Henny Strauch A [place], Theresia Majors Phone Number: 04/17/2023, 4:41 PM  Clinical Narrative:                  CSW met with pt at bedside. He stated that he was agreeable to SNF  and CSW completed SNF workup. CSW sent out referrals. Bed offers pending.    TOC will continue to follow.   Expected Discharge Plan: Skilled Nursing Facility Barriers to Discharge: Continued Medical Work up, English as a second language teacher, SNF Pending bed offer   Patient Goals and CMS Choice Patient states their goals for this hospitalization and ongoing recovery are:: feel better          Expected Discharge Plan and Services       Living arrangements for the past 2 months: Single Family Home                                      Prior Living Arrangements/Services Living arrangements for the past 2 months: Single Family Home Lives with:: Self                   Activities of Daily Living   ADL Screening (condition at time of admission) Independently performs ADLs?: Yes (appropriate for developmental age) Is the patient deaf or have difficulty hearing?: No Does the patient have difficulty seeing, even when wearing glasses/contacts?: No Does the patient have difficulty concentrating, remembering, or making decisions?: No  Permission Sought/Granted                  Emotional Assessment Appearance:: Appears stated age Attitude/Demeanor/Rapport: Engaged Affect (typically observed): Appropriate Orientation: : Oriented to Self, Oriented to Place, Oriented to  Time, Oriented to Situation Alcohol / Substance Use: Not Applicable Psych Involvement: No (comment)  Admission diagnosis:  Acute cholecystitis [K81.0] Abdominal pain, unspecified abdominal location [R10.9] Sepsis, due to unspecified organism, unspecified  whether acute organ dysfunction present [facility]) [A41.9] Patient Active Problem List   Diagnosis Date Noted   Hypomagnesemia 04/17/2023   Sepsis with acute organ dysfunction (HCC) 04/16/2023   CKD stage 3a, GFR 45-59 ml/min (HCC) - baseline SCr 1.5 04/16/2023   Acute urinary retention 04/16/2023   Acute cholecystitis - s/p cholecystostomy drain placement on 04-16-2023. 04/14/2023   Thrombocytopenia (HCC) 04/14/2023   Abnormal CT of the abdomen 04/14/2023   Lactic acidosis 04/14/2023   Anemia due to chronic blood loss 10/31/2021   Esophageal stricture 10/04/2021   Hypotension 10/01/2021   History of CVA (cerebrovascular accident) 10/01/2021   Chronic hyponatremia - Baseline around 128 10/01/2021   Stroke (HCC) 06/02/2021   Renal mass 05/23/2021   Aortic atherosclerosis (HCC) 04/10/2021   Atrial fibrillation, permanent (HCC) 11/24/2014   DM2 (diabetes mellitus, type 2) (HCC) 09/29/2014   Essential hypertension 09/29/2014   Dyslipidemia associated with type 2 diabetes mellitus (HCC) 09/29/2014   PCP:  Cleatis Polka., MD Pharmacy:   [facility] [phone] - [place], [place] - [address] AT [facility] [address] [place] [address] Phone: [phone] Fax: [phone]  [facility] [address] [place] [address] Phone: [phone] Fax: [phone]  [id number] [place] - [address] AT  [facility] [address] [facility] [address] [place] [address] Phone: [phone] Fax: [phone]     Social Determinants of Health (SDOH) Social History: SDOH Screenings   Food Insecurity: No Food Insecurity (04/15/2023)  Housing: Low Risk  (04/15/2023)  Transportation Needs: No Transportation Needs (04/15/2023)  Utilities: Not At Risk (04/15/2023)  Tobacco Use: Medium Risk (04/16/2023)   SDOH Interventions:     Readmission Risk  Interventions    10/04/2021   12:20 PM  Readmission Risk Prevention Plan  Transportation Screening Complete  PCP or Specialist Appt within 5-7 Days Complete  Home Care Screening Complete  Medication Review (RN CM) Complete

## 2023-04-18 DIAGNOSIS — A419 Sepsis, unspecified organism: Secondary | ICD-10-CM | POA: Diagnosis not present

## 2023-04-18 DIAGNOSIS — K810 Acute cholecystitis: Secondary | ICD-10-CM | POA: Diagnosis not present

## 2023-04-18 DIAGNOSIS — E861 Hypovolemia: Secondary | ICD-10-CM | POA: Diagnosis not present

## 2023-04-18 DIAGNOSIS — R338 Other retention of urine: Secondary | ICD-10-CM | POA: Diagnosis not present

## 2023-04-18 LAB — COMPREHENSIVE METABOLIC PANEL
ALT: 20 U/L (ref 0–44)
AST: 38 U/L (ref 15–41)
Albumin: 2 g/dL — ABNORMAL LOW (ref 3.5–5.0)
Alkaline Phosphatase: 46 U/L (ref 38–126)
Anion gap: 7 (ref 5–15)
BUN: 26 mg/dL — ABNORMAL HIGH (ref 8–23)
CO2: 23 mmol/L (ref 22–32)
Calcium: 10.4 mg/dL — ABNORMAL HIGH (ref 8.9–10.3)
Chloride: 97 mmol/L — ABNORMAL LOW (ref 98–111)
Creatinine, Ser: 1.56 mg/dL — ABNORMAL HIGH (ref 0.61–1.24)
GFR, Estimated: 48 mL/min — ABNORMAL LOW (ref >60)
Glucose, Bld: 107 mg/dL — ABNORMAL HIGH (ref 70–99)
Potassium: 4.3 mmol/L (ref 3.5–5.1)
Sodium: 127 mmol/L — ABNORMAL LOW (ref 135–145)
Total Bilirubin: 1.3 mg/dL — ABNORMAL HIGH (ref <1.2)
Total Protein: 4.8 g/dL — ABNORMAL LOW (ref 6.5–8.1)

## 2023-04-18 LAB — GLUCOSE, CAPILLARY
Glucose-Capillary: 106 mg/dL — ABNORMAL HIGH (ref 70–99)
Glucose-Capillary: 138 mg/dL — ABNORMAL HIGH (ref 70–99)
Glucose-Capillary: 212 mg/dL — ABNORMAL HIGH (ref 70–99)
Glucose-Capillary: 302 mg/dL — ABNORMAL HIGH (ref 70–99)

## 2023-04-18 LAB — T4, FREE: Free T4: 1.15 ng/dL — ABNORMAL HIGH (ref 0.61–1.12)

## 2023-04-18 LAB — MAGNESIUM: Magnesium: 1.9 mg/dL (ref 1.7–2.4)

## 2023-04-18 LAB — TSH: TSH: 3.794 u[IU]/mL (ref 0.350–4.500)

## 2023-04-18 NOTE — Progress Notes (Signed)
   Attempted to call pt's dtr stephanie letchworth @ [phone]. No answer. Phone call went right to VM.  Carollee Herter, DO [facility]

## 2023-04-18 NOTE — Progress Notes (Signed)
Subjective/Chief Complaint: No pain, tol reg diet   Objective: Vital signs in last 24 hours: Temp:  [97.4 F (36.3 C)-98 F (36.7 C)] 97.9 F (36.6 C) (11/22 0300) Pulse Rate:  [71-105] 84 (11/22 0300) BP: (96-118)/(61-78) 118/71 (11/22 0300) SpO2:  [94 %-98 %] 98 % (11/22 0300) Last BM Date : 04/14/23  Intake/Output from previous day: 11/21 0701 - 11/22 0700 In: -  Out: 5880 [Urine:5600; Drains:280] Intake/Output this shift: No intake/output data recorded.  Ab soft nontender drain functional  Lab Results:  Recent Labs    04/16/23 0626 04/17/23 0554  WBC 8.8 8.7  HGB 11.8* 11.5*  HCT 33.9* 32.6*  PLT 95* 113*   BMET Recent Labs    04/17/23 0554 04/18/23 0623  NA 124* 127*  K 3.7 4.3  CL 95* 97*  CO2 23 23  GLUCOSE 111* 107*  BUN 22 26*  CREATININE 1.55* 1.56*  CALCIUM 9.7 10.4*   PT/INR Recent Labs    04/16/23 0626 04/17/23 0554  LABPROT 19.3* 17.1*  INR 1.6* 1.4*   ABG No results for input(s): "PHART", "HCO3" in the last 72 hours.  Invalid input(s): "PCO2", "PO2"  Studies/Results: IR Perc Cholecystostomy  Result Date: 04/16/2023 INDICATION: Acute cholecystitis. Poor operative candidate. Presents for image guided cholecystostomy tube placement for infection source control purposes. EXAM: ULTRASOUND AND FLUOROSCOPIC-GUIDED CHOLECYSTOSTOMY TUBE PLACEMENT COMPARISON:  None Available. MEDICATIONS: The patient is currently admitted to the hospital and on intravenous antibiotics. Antibiotics were administered within an appropriate time frame prior to skin puncture. ANESTHESIA/SEDATION: Moderate (conscious) sedation was employed during this procedure. A total of Versed mg and Fentanyl mcg was administered intravenously. Moderate Sedation Time: minutes. The patient's level of consciousness and vital signs were monitored continuously by radiology nursing throughout the procedure under my direct supervision. CONTRAST:  15mL OMNIPAQUE IOHEXOL 300 MG/ML SOLN  - administered into the gallbladder fossa. FLUOROSCOPY TIME:  minutes  seconds ( mGy) COMPLICATIONS: None immediate. PROCEDURE: Informed written consent was obtained from the patient after a discussion of the risks, benefits and alternatives to treatment. Questions regarding the procedure were encouraged and answered. A timeout was performed prior to the initiation of the procedure. The right upper abdominal quadrant was prepped and draped in the usual sterile fashion, and a sterile drape was applied covering the operative field. Maximum barrier sterile technique with sterile gowns and gloves were used for the procedure. A timeout was performed prior to the initiation of the procedure. Local anesthesia was provided with 1% lidocaine with epinephrine. Ultrasound scanning of the right upper quadrant demonstrates a markedly dilated gallbladder. Of note, the patient reported pain with ultrasound imaging over the gallbladder. Utilizing a transhepatic approach, a 22 gauge needle was advanced into the gallbladder under direct ultrasound guidance. An ultrasound image was saved for documentation purposes. Appropriate intraluminal puncture was confirmed with the efflux of bile and advancement of an 0.018 wire into the gallbladder lumen. The needle was exchanged for an Accustick set. A small amount of contrast was injected to confirm appropriate intraluminal positioning. Over a Benson wire, a 10.2-French Cook cholecystomy tube was advanced into the gallbladder fossa, coiled and locked. A small amount foul-smelling bile was aspirated, capped and sent to the laboratory for analysis. A small amount of contrast was injected as several post procedural spot radiographic images were obtained in various obliquities. The catheter was secured to the skin with suture, connected to a drainage bag and a dressing was applied. The patient tolerated the procedure well without immediate post  procedural complication. IMPRESSION: Successful  ultrasound and fluoroscopic guided placement of a 10.2 French cholecystostomy tube. Electronically Signed   By: Simonne Come M.D.   On: 04/16/2023 13:55    Anti-infectives: Anti-infectives (From admission, onward)    Start     Dose/Rate Route Frequency Ordered Stop   04/17/23 1000  amoxicillin-clavulanate (AUGMENTIN) 875-125 MG per tablet 1 tablet        1 tablet Oral Every 12 hours 04/17/23 0805     04/17/23 1000  metroNIDAZOLE (FLAGYL) tablet 500 mg  Status:  Discontinued        500 mg Oral Every 12 hours 04/17/23 0805 04/17/23 0816   04/15/23 1800  cefTRIAXone (ROCEPHIN) 2 g in sodium chloride 0.9 % 100 mL IVPB  Status:  Discontinued        2 g 200 mL/hr over 30 Minutes Intravenous Every 24 hours 04/14/23 2341 04/17/23 0805   04/15/23 0800  metroNIDAZOLE (FLAGYL) IVPB 500 mg  Status:  Discontinued        500 mg 100 mL/hr over 60 Minutes Intravenous 2 times daily 04/14/23 2345 04/17/23 0805   04/14/23 2200  metroNIDAZOLE (FLAGYL) IVPB 500 mg  Status:  Discontinued        500 mg 100 mL/hr over 60 Minutes Intravenous 2 times daily 04/14/23 2341 04/14/23 2345   04/14/23 1645  cefTRIAXone (ROCEPHIN) 2 g in sodium chloride 0.9 % 100 mL IVPB       Placed in "And" Linked Group   2 g 200 mL/hr over 30 Minutes Intravenous  Once 04/14/23 1630 04/14/23 1805   04/14/23 1645  metroNIDAZOLE (FLAGYL) IVPB 500 mg       Placed in "And" Linked Group   500 mg 100 mL/hr over 60 Minutes Intravenous  Once 04/14/23 1630 04/14/23 1913       Assessment/Plan: Cholecystitis -will sign off -I will see back after drain study to discuss surgery  Emelia Loron 04/18/2023

## 2023-04-18 NOTE — Progress Notes (Signed)
Ok to hold ASPIRE study drug (ASA vs Apixaban) per Dr. Pearlean Brownie until discharge.   Ulyses Southward, PharmD, BCIDP, AAHIVP, CPP Infectious Disease Pharmacist 04/18/2023 9:35 AM

## 2023-04-18 NOTE — Progress Notes (Addendum)
   Able to talk via phone with his dtr stephanie letchworth. She will talk to pt about accepting offers to a SNF.  Carollee Herter, DO [facility]

## 2023-04-18 NOTE — Plan of Care (Signed)

## 2023-04-18 NOTE — TOC Progression Note (Signed)
Transition of Care [facility]) - Progression Note    Patient Details  Name: Angel Shelton MRN: [id number] Date of Birth: 1956-02-04  [facility]) CM/SW Contact  Aleicia Kenagy A [place], [place] Phone Number: 04/18/2023, 10:51 AM  Clinical Narrative:     CSW met with pt at bedside to provide bed offers. He stated he would discuss with his daughter and make a decision on a facility and let CSW know. CSW contact information provided.   TOC will continue to follow.    Expected Discharge Plan: Skilled Nursing Facility Barriers to Discharge: Continued Medical Work up, English as a second language teacher, SNF Pending bed offer  Expected Discharge Plan and Services       Living arrangements for the past 2 months: Single Family Home                                       Social Determinants of Health (SDOH) Interventions SDOH Screenings   Food Insecurity: No Food Insecurity (04/15/2023)  Housing: Low Risk  (04/15/2023)  Transportation Needs: No Transportation Needs (04/15/2023)  Utilities: Not At Risk (04/15/2023)  Tobacco Use: Medium Risk (04/16/2023)    Readmission Risk Interventions    10/04/2021   12:20 PM  Readmission Risk Prevention Plan  Transportation Screening Complete  PCP or Specialist Appt within 5-7 Days Complete  Home Care Screening Complete  Medication Review (RN CM) Complete

## 2023-04-18 NOTE — Plan of Care (Signed)
A/Ox4 and on room air. No complaints of pain this shift. Foley in place, had over 2L output overnight.  Perc drain in place as well.    Problem: Education: Goal: Ability to describe self-care measures that may prevent or decrease complications (Diabetes Survival Skills Education) will improve Outcome: Progressing Goal: Individualized Educational Video(s) Outcome: Progressing   Problem: Coping: Goal: Ability to adjust to condition or change in health will improve Outcome: Progressing   Problem: Fluid Volume: Goal: Ability to maintain a balanced intake and output will improve Outcome: Progressing   Problem: Health Behavior/Discharge Planning: Goal: Ability to identify and utilize available resources and services will improve Outcome: Progressing Goal: Ability to manage health-related needs will improve Outcome: Progressing   Problem: Metabolic: Goal: Ability to maintain appropriate glucose levels will improve Outcome: Progressing   Problem: Nutritional: Goal: Maintenance of adequate nutrition will improve Outcome: Progressing Goal: Progress toward achieving an optimal weight will improve Outcome: Progressing   Problem: Skin Integrity: Goal: Risk for impaired skin integrity will decrease Outcome: Progressing   Problem: Tissue Perfusion: Goal: Adequacy of tissue perfusion will improve Outcome: Progressing   Problem: Education: Goal: Knowledge of General Education information will improve Description: Including pain rating scale, medication(s)/side effects and non-pharmacologic comfort measures Outcome: Progressing   Problem: Health Behavior/Discharge Planning: Goal: Ability to manage health-related needs will improve Outcome: Progressing   Problem: Clinical Measurements: Goal: Ability to maintain clinical measurements within normal limits will improve Outcome: Progressing Goal: Will remain free from infection Outcome: Progressing Goal: Diagnostic test results will  improve Outcome: Progressing Goal: Respiratory complications will improve Outcome: Progressing Goal: Cardiovascular complication will be avoided Outcome: Progressing   Problem: Activity: Goal: Risk for activity intolerance will decrease Outcome: Progressing   Problem: Nutrition: Goal: Adequate nutrition will be maintained Outcome: Progressing   Problem: Coping: Goal: Level of anxiety will decrease Outcome: Progressing   Problem: Elimination: Goal: Will not experience complications related to bowel motility Outcome: Progressing Goal: Will not experience complications related to urinary retention Outcome: Progressing   Problem: Pain Management: Goal: General experience of comfort will improve Outcome: Progressing   Problem: Safety: Goal: Ability to remain free from injury will improve Outcome: Progressing   Problem: Skin Integrity: Goal: Risk for impaired skin integrity will decrease Outcome: Progressing   Problem: Education: Goal: Ability to describe self-care measures that may prevent or decrease complications (Diabetes Survival Skills Education) will improve Outcome: Progressing Goal: Individualized Educational Video(s) Outcome: Progressing   Problem: Coping: Goal: Ability to adjust to condition or change in health will improve Outcome: Progressing   Problem: Fluid Volume: Goal: Ability to maintain a balanced intake and output will improve Outcome: Progressing   Problem: Health Behavior/Discharge Planning: Goal: Ability to identify and utilize available resources and services will improve Outcome: Progressing Goal: Ability to manage health-related needs will improve Outcome: Progressing   Problem: Metabolic: Goal: Ability to maintain appropriate glucose levels will improve Outcome: Progressing   Problem: Nutritional: Goal: Maintenance of adequate nutrition will improve Outcome: Progressing Goal: Progress toward achieving an optimal weight will  improve Outcome: Progressing   Problem: Skin Integrity: Goal: Risk for impaired skin integrity will decrease Outcome: Progressing   Problem: Tissue Perfusion: Goal: Adequacy of tissue perfusion will improve Outcome: Progressing   Problem: Education: Goal: Knowledge of General Education information will improve Description: Including pain rating scale, medication(s)/side effects and non-pharmacologic comfort measures Outcome: Progressing   Problem: Health Behavior/Discharge Planning: Goal: Ability to manage health-related needs will improve Outcome: Progressing   Problem: Clinical Measurements:  Goal: Ability to maintain clinical measurements within normal limits will improve Outcome: Progressing Goal: Will remain free from infection Outcome: Progressing Goal: Diagnostic test results will improve Outcome: Progressing Goal: Respiratory complications will improve Outcome: Progressing Goal: Cardiovascular complication will be avoided Outcome: Progressing   Problem: Activity: Goal: Risk for activity intolerance will decrease Outcome: Progressing   Problem: Nutrition: Goal: Adequate nutrition will be maintained Outcome: Progressing   Problem: Coping: Goal: Level of anxiety will decrease Outcome: Progressing   Problem: Elimination: Goal: Will not experience complications related to bowel motility Outcome: Progressing Goal: Will not experience complications related to urinary retention Outcome: Progressing   Problem: Pain Management: Goal: General experience of comfort will improve Outcome: Progressing   Problem: Safety: Goal: Ability to remain free from injury will improve Outcome: Progressing   Problem: Skin Integrity: Goal: Risk for impaired skin integrity will decrease Outcome: Progressing

## 2023-04-18 NOTE — Progress Notes (Signed)
PROGRESS NOTE    Angel Shelton  ION:[id number] DOB: 08-14-1955 DOA: 04/14/2023 PCP: Cleatis Polka., MD  Subjective: Pt seen and examined.  Pt tolerating low fat diet.  Pt agreeable to going to SNF at discharge.  Wants to keep his foley.  Discussed taking his foley out tomorrow so he could avoid going to SNF with foley.   Hospital Course: HPI: Angel Shelton is a 67 y.o. male with medical history significant of HTN, atrial fibrillation on Eliquis, CVA, type 2 diabetes, history of renal mass s/p right partial nephrectomy who presents with abdominal pain.   Patient reports left lower quadrant abdominal pain that has radiated to his right abdomen since last week.  Has been weak and mostly been staying in bed.  Had decreased oral intake.  However has still been compliant with his medication including his antihypertensives.  He denies any nausea, vomiting or diarrhea.  Patient is on Eliquis for atrial fibrillation and took last dose today at 1 PM.   On arrival to ED, he was afebrile, BP of 70/58 on room air.   CBC with leukocytosis of 14.7, hemoglobin of 13.5, thrombocytopenia of 115.  Lactate initially elevated at 2.2   CMP with hyponatremia of 128, chloride of 97, CO2 of 18 with normal anion gap of 13 although trending up from prior.  Creatinine also elevated at 2.04 with unclear baseline. LFTs are within normal limits but with hyperbilirubinemia 2.9.  Lipase within normal limits.   CT abdomen chest with calcified gallstone noted within gallbladder lumen and another likely impacted gallstone within the gallbladder neck.  Findings concerning for acute cholecystitis.  There is also nonobstructive bilateral nephrolithiasis. Acute cholecystitis was confirmed on right upper quadrant ultrasound.   General surgery was consulted and recommend keeping n.p.o. at midnight for surgical intervention in the morning.  He was started on IV Rocephin and Flagyl in the ED as well as given 3  L of normal saline bolus fluids.  Hospitalist consulted for admission.  Significant Events: Admitted 04/14/2023 for acute cholecystitis   Significant Labs: Admission WBC 14.7, lactic acid 2.2, Na 128  Significant Imaging Studies: Admission CT abd shows Cholelithiasis with acute cholecystitis. Associated 1.6 cm gallstone likely impacted within the gallbladder neck. No CT evidence of choledocholithiasis. Recommend surgical consultation. 2. Bowel thickening of the hepatic flexure and mild bowel wall thickening of the ascending colon in the setting of colonic diverticulosis. Finding likely reactive in etiology. Given slight focal irregular bowel thickening along the colonic diverticula, differential diagnosis includes much less likely concurrent acute diverticulitis. Consider colonoscopy status post treatment and status post complete resolution of inflammatory changes to exclude an underlying lesion. 3. Incidentally noted possible thinning of the gastric fundal wall. Limited evaluation due to under distension of the stomach. Recommend correlation with signs and symptoms of gastric ulceration. Consider outpatient endoscopy for direct visualization if clinically indicated. 4. Nonobstructive bilateral nephrolithiasis measuring up to 7 mm on the left and punctate on the right. 5. Status post partial right nephrectomy. 6. Prostate is prominent in size with associated urinary bladder distension with urine and mild urinary bladder wall trabeculations. Correlate with signs and symptoms of obstructive uropathy. 7.  Aortic Atherosclerosis (ICD10-I70.0)-severe. Admission RUQ U/S shows Cholelithiasis with evidence of acute cholecystitis   Antibiotic Therapy: Anti-infectives (From admission, onward)    Start     Dose/Rate Route Frequency Ordered Stop   04/15/23 1800  cefTRIAXone (ROCEPHIN) 2 g in sodium chloride 0.9 % 100 mL IVPB  2 g 200 mL/hr over 30 Minutes Intravenous Every 24 hours 04/14/23 2341      04/15/23 0800  metroNIDAZOLE (FLAGYL) IVPB 500 mg        500 mg 100 mL/hr over 60 Minutes Intravenous 2 times daily 04/14/23 2345     04/14/23 2200  metroNIDAZOLE (FLAGYL) IVPB 500 mg  Status:  Discontinued        500 mg 100 mL/hr over 60 Minutes Intravenous 2 times daily 04/14/23 2341 04/14/23 2345   04/14/23 1645  cefTRIAXone (ROCEPHIN) 2 g in sodium chloride 0.9 % 100 mL IVPB       Placed in "And" Linked Group   2 g 200 mL/hr over 30 Minutes Intravenous  Once 04/14/23 1630 04/14/23 1805   04/14/23 1645  metroNIDAZOLE (FLAGYL) IVPB 500 mg       Placed in "And" Linked Group   500 mg 100 mL/hr over 60 Minutes Intravenous  Once 04/14/23 1630 04/14/23 1913       Procedures:   Consultants: General Surgery IR    Assessment and Plan: * Acute cholecystitis - s/p cholecystostomy drain placement on 04-16-2023. 04-14-2023  CT imaging with calcified gallstone noted within gallbladder lumen and another likely impacted gallstone within the gallbladder neck.  -General surgery consulted. Pt last took Eliquis at 1pm today. Will awaiting general surgery recommendation for timing of acute cholecystectomy.  Will keep n.p.o. past midnight. -Continue IV Rocephin and Flagyl  04-16-2023 general surgery has requested IR place cholecystostomy tube today. Continue IV ABX with rocephin, flagyl  04-17-2023 will change to po abx (augmentin). IR will contact patient to schedule outpatient imaging in 6 weeks. Will ask surgery if Eliquis can be restarted. I see no contraindications. Pt tolerated clear liquids for breakfast. Will advance to low fat diet.  04-18-2023 tolerating low fat diet.   Sepsis with acute organ dysfunction (HCC) 04-16-2023 present on admission. WBC 14.7, lactic acid 2.2, acute cholecystitis seen on CT abd 04-17-2023 resolved. WBC down to 8.7. no fevers.   Hypotension 04-14-2023 BP of 70/58 on presentation. Now normotensive with 3L of NS bolus -Continue to hold antihypertensives  overnight -Can resume lisinopril-HCTZ, diltiazem, metoprolol and Jardiance in the morning  04-16-2023 resolved. Continue to hold antihypertensives.  Acute urinary retention 04-16-2023 continue with foley catheter. Continue flomax and proscar.  04-18-2023 pt wants to keep foley today. Will attempt foley removal tomorrow so he doesn't have to go to SNF with foley.  CKD stage 3a, GFR 45-59 ml/min (HCC) - baseline SCr 1.5 04-16-2023 continue IVF while NPO.  04-17-2023 Scr back down to 1.55. which is his baseline.  Lactic acidosis 04-14-2023 -Secondary to dehydration -has received 3L NS bolus  04-16-2023 resolved.  Abnormal CT of the abdomen 04-14-2023- CT a/p with Bowel thickening of the hepatic flexure and mild bowel wall thickening of the ascending colon in the setting of colonic diverticulosis. Finding likely reactive in  etiology. Given slight focal irregular bowel thickening along the colonic diverticula, differential diagnosis includes much less likely concurrent acute diverticulitis. Consider colonoscopy  status post treatment and status post complete resolution of inflammatory changes to exclude an underlying lesion. -there was also findings of possible thickening of the gastric fundal wall but patient has no signs of symptoms of gastric ulceration  04-16-2023 - will need outpatient GI referral for EGD/colonoscopy.  Thrombocytopenia (HCC) 04-14-2023 -likely reactive  04-16-2023 stable @ 95K  04-17-2023 improved. Plt cnt 113K today  Chronic hyponatremia - Baseline around 128 04-14-2023 -due to dehydration. -Received 3 L  of normal saline bolus. - Follow repeat in the morning  04-16-2023 chronic. Baseline around 128  04-17-2023 Na 124. Likely due to low solute intake last 24-48 hours. Pt placed back on low fat diet today.  04-18-2023 Na up to 127 after starting back on low fat diet. TSH is normal at 3.79  History of CVA (cerebrovascular accident) 04-14-2023 -has already  taken dose of aspirin today. Hold pending possible surgery tomorrow  04-16-2023 stable.  Atrial fibrillation, permanent (HCC) 04-14-2023  Resume diltiazem and metoprolol tomorrow due to hypotension on presentation  04-16-2023 stable. Continue to hold cardizem and lopressor due to normal BP.  04-17-2023 had some fast HR last night. Cardizem-CD and Toprol-XL restarted last night. Will continue and monitor pt's HR. Keep K >4 and Mg >2.0. give po kcl and IV mag today. Restarting eliquis today.  04-18-2023 discussed with pharmacy. Apparently pt was in research trial regarding asa vs eliquis vs placebo. Unclear which research arm pt was in.  Neurology researcher(Sethi) contacted. Pt to hold eliquis during hospitalization. Pt to restart research trial drug at discharge.  Dyslipidemia associated with type 2 diabetes mellitus (HCC) 04-14-2023 Continue statin and fish oil  04-16-2023 continue crestor 40 mg every day, lovaza every day, fenofibrate 160 mg qd  DM2 (diabetes mellitus, type 2) (HCC) 04-14-2023  A1c last year of 6.2.  No hypoglycemia on presentation. -Holding Jardiance due to hypotension -on 10 unit of Toujeo at home -place on SSI  04-16-2023 continue SSI.  Hypomagnesemia 04-17-2023 Mg 1.6 today. Will give 2 grams IV mag  04-18-2023 resolved. Mg 1.9       DVT prophylaxis: SCDs Start: 04/14/23 2319    Code Status: Full Code Family Communication: no family at bedside Disposition Plan: SNF placement Reason for continuing need for hospitalization: medically stable for DC.  Objective: Vitals:   04/17/23 1900 04/18/23 0003 04/18/23 0300 04/18/23 0740  BP: 111/74 114/78 118/71 118/70  Pulse: 86 81 84 93  Resp:    15  Temp: 98 F (36.7 C) (!) 97.4 F (36.3 C) 97.9 F (36.6 C) 98 F (36.7 C)  TempSrc: Oral Oral Oral   SpO2: 96% 98% 98% 95%  Weight:      Height:        Intake/Output Summary (Last 24 hours) at 04/18/2023 1045 Last data filed at 04/18/2023 1610 Gross  per 24 hour  Intake --  Output 5880 ml  Net -5880 ml   Filed Weights   04/15/23 0122 04/16/23 0503  Weight: 77.2 kg 78.5 kg    Examination:  Physical Exam Vitals and nursing note reviewed.  Constitutional:      General: He is not in acute distress.    Appearance: He is not toxic-appearing.  HENT:     Head: Normocephalic and atraumatic.     Nose: Nose normal.  Cardiovascular:     Rate and Rhythm: Normal rate and regular rhythm.  Pulmonary:     Effort: Pulmonary effort is normal.  Abdominal:     General: Abdomen is flat.     Comments: +perc drain RUQ  Genitourinary:    Comments: +foley Musculoskeletal:     Right lower leg: No edema.     Left lower leg: No edema.  Skin:    General: Skin is warm and dry.     Capillary Refill: Capillary refill takes less than 2 seconds.  Neurological:     Mental Status: He is alert and oriented to person, place, and time.     Data  Reviewed: I have personally reviewed following labs and imaging studies  CBC: Recent Labs  Lab 04/14/23 1423 04/15/23 0654 04/16/23 0626 04/17/23 0554  WBC 14.7* 10.0 8.8 8.7  HGB 13.5 12.2* 11.8* 11.5*  HCT 40.0 34.5* 33.9* 32.6*  MCV 94.1 92.5 92.1 89.6  PLT 115* 102* 95* 113*   Basic Metabolic Panel: Recent Labs  Lab 04/14/23 1535 04/14/23 2339 04/15/23 0654 04/16/23 0626 04/17/23 0554 04/18/23 0623  NA 128*  --  128* 126* 124* 127*  K 3.5  --  3.4* 3.5 3.7 4.3  CL 97*  --  97* 96* 95* 97*  CO2 18*  --  21* 21* 23 23  GLUCOSE 70  --  94 90 111* 107*  BUN 31*  --  31* 23 22 26*  CREATININE 2.04*  --  2.10* 1.75* 1.55* 1.56*  CALCIUM 8.4*  --  9.3 9.4 9.7 10.4*  MG  --  1.6*  --  1.8 1.6* 1.9  PHOS  --  3.6  --  1.9* 2.3*  --    GFR: Estimated Creatinine Clearance: 46.8 mL/min (A) (by C-G formula based on SCr of 1.56 mg/dL (H)). Liver Function Tests: Recent Labs  Lab 04/14/23 1535 04/15/23 1040 04/16/23 0626 04/17/23 0554 04/18/23 0623  AST 35 51* 42* 29 38  ALT 20 23 21 20  20   ALKPHOS 39 55 49 46 46  BILITOT 2.9* 1.3* 1.2* 0.9 1.3*  PROT 4.7* 5.3* 4.8* 4.5* 4.8*  ALBUMIN 2.2* 2.2* 1.9* 1.9* 2.0*   Recent Labs  Lab 04/14/23 1535 04/16/23 0626 04/17/23 0554  LIPASE 51 61* 52*    Coagulation Profile: Recent Labs  Lab 04/15/23 1040 04/16/23 0626 04/17/23 0554  INR 1.6* 1.6* 1.4*   CBG: Recent Labs  Lab 04/17/23 0857 04/17/23 1143 04/17/23 1703 04/17/23 2208 04/18/23 0739  GLUCAP 147* 135* 115* 116* 106*   Thyroid Function Tests: Recent Labs    04/18/23 0621  TSH 3.794  FREET4 1.15*   Sepsis Labs: Recent Labs  Lab 04/14/23 1437 04/14/23 1643  LATICACIDVEN 2.2* 1.6    Recent Results (from the past 240 hour(s))  Aerobic/Anaerobic Culture w Gram Stain (surgical/deep wound)     Status: None (Preliminary result)   Collection Time: 04/16/23 12:23 PM   Specimen: Abscess  Result Value Ref Range Status   Specimen Description ABSCESS  Final   Special Requests NONE  Final   Gram Stain   Final    RARE WBC PRESENT,BOTH PMN AND MONONUCLEAR MODERATE GRAM NEGATIVE RODS RARE GRAM POSITIVE RODS RARE BUDDING YEAST SEEN    Culture   Final    ABUNDANT KLEBSIELLA PNEUMONIAE SUSCEPTIBILITIES TO FOLLOW ABUNDANT GRAM NEGATIVE RODS IDENTIFICATION AND SUSCEPTIBILITIES TO FOLLOW Performed at [facility], [address], [place], [place] [address]    Report Status PENDING  Incomplete     Radiology Studies: IR Perc Cholecystostomy  Result Date: 04/16/2023 INDICATION: Acute cholecystitis. Poor operative candidate. Presents for image guided cholecystostomy tube placement for infection source control purposes. EXAM: ULTRASOUND AND FLUOROSCOPIC-GUIDED CHOLECYSTOSTOMY TUBE PLACEMENT COMPARISON:  None Available. MEDICATIONS: The patient is currently admitted to the hospital and on intravenous antibiotics. Antibiotics were administered within an appropriate time frame prior to skin puncture. ANESTHESIA/SEDATION: Moderate (conscious) sedation was  employed during this procedure. A total of Versed mg and Fentanyl mcg was administered intravenously. Moderate Sedation Time: minutes. The patient's level of consciousness and vital signs were monitored continuously by radiology nursing throughout the procedure under my direct supervision. CONTRAST:  15mL OMNIPAQUE IOHEXOL 300 MG/ML SOLN - administered into the gallbladder fossa. FLUOROSCOPY TIME:  minutes  seconds ( mGy) COMPLICATIONS: None immediate. PROCEDURE: Informed written consent was obtained from the patient after a discussion of the risks, benefits and alternatives to treatment. Questions regarding the procedure were encouraged and answered. A timeout was performed prior to the initiation of the procedure. The right upper abdominal quadrant was prepped and draped in the usual sterile fashion, and a sterile drape was applied covering the operative field. Maximum barrier sterile technique with sterile gowns and gloves were used for the procedure. A timeout was performed prior to the initiation of the procedure. Local anesthesia was provided with 1% lidocaine with epinephrine. Ultrasound scanning of the right upper quadrant demonstrates a markedly dilated gallbladder. Of note, the patient reported pain with ultrasound imaging over the gallbladder. Utilizing a transhepatic approach, a 22 gauge needle was advanced into the gallbladder under direct ultrasound guidance. An ultrasound image was saved for documentation purposes. Appropriate intraluminal puncture was confirmed with the efflux of bile and advancement of an 0.018 wire into the gallbladder lumen. The needle was exchanged for an Accustick set. A small amount of contrast was injected to confirm appropriate intraluminal positioning. Over a Benson wire, a 10.2-French Cook cholecystomy tube was advanced into the gallbladder fossa, coiled and locked. A small amount foul-smelling bile was aspirated, capped and sent to the laboratory for analysis. A small  amount of contrast was injected as several post procedural spot radiographic images were obtained in various obliquities. The catheter was secured to the skin with suture, connected to a drainage bag and a dressing was applied. The patient tolerated the procedure well without immediate post procedural complication. IMPRESSION: Successful ultrasound and fluoroscopic guided placement of a 10.2 French cholecystostomy tube. Electronically Signed   By: Simonne Come M.D.   On: 04/16/2023 13:55    Scheduled Meds:  amoxicillin-clavulanate  1 tablet Oral Q12H   Chlorhexidine Gluconate Cloth  6 each Topical Daily   diltiazem  240 mg Oral Daily   feeding supplement  237 mL Oral BID BM   fenofibrate  160 mg Oral Daily   finasteride  5 mg Oral Daily   folic acid  1 mg Oral Daily   gabapentin  300 mg Oral TID   insulin aspart  0-9 Units Subcutaneous TID WC   Lotilaner  1 drop Both Eyes BID   metoprolol succinate  100 mg Oral Daily   multivitamin with minerals  1 tablet Oral Daily   omega-3 acid ethyl esters  1 g Oral Daily   pantoprazole  40 mg Oral QAC breakfast   rosuvastatin  40 mg Oral Daily   saccharomyces boulardii  250 mg Oral BID   sodium chloride flush  5 mL Intracatheter Q8H   tamsulosin  0.4 mg Oral QPC supper   thiamine  100 mg Oral Daily   Or   thiamine  100 mg Intravenous Daily   Continuous Infusions:   LOS: 4 days   Time spent: 35 minutes  Carollee Herter, DO  [facility]  04/18/2023, 10:45 AM

## 2023-04-18 NOTE — Progress Notes (Signed)
Physical Therapy Treatment Patient Details Name: Angel Shelton MRN: [id number] DOB: 04-07-1956 Today's Date: 04/18/2023   History of Present Illness Angel Shelton is a 67 y.o. malewho presents with abdominal pain.  Cholecystitis and received chole drain.PMH:  HTN, atrial fibrillation on Eliquis, CVA, type 2 diabetes, history of renal mass s/p right partial nephrectomy    PT Comments  Pt received in supine and agreeable to session. Pt able to perform bed mobility, transfers, and a short gait trial in the room with up to min A. Pt continues to be limited by dizziness with mobility and fatigue. Pt initially agreeable to sit in the recliner at the end of the session, however requests to return to bed despite encouragement. Pt continues to benefit from PT services to progress toward functional mobility goals.     If plan is discharge home, recommend the following: A lot of help with walking and/or transfers;A little help with bathing/dressing/bathroom;Assistance with cooking/housework;Assist for transportation;Help with stairs or ramp for entrance   Can travel by private vehicle     No  Equipment Recommendations  Rolling walker (2 wheels);BSC/3in1    Recommendations for Other Services       Precautions / Restrictions Precautions Precautions: Fall Restrictions Weight Bearing Restrictions: No     Mobility  Bed Mobility Overal bed mobility: Needs Assistance Bed Mobility: Supine to Sit, Sit to Supine     Supine to sit: Contact guard, Used rails, HOB elevated Sit to supine: Contact guard assist   General bed mobility comments: increased time and effort    Transfers Overall transfer level: Needs assistance Equipment used: Rolling walker (2 wheels) Transfers: Sit to/from Stand, Bed to chair/wheelchair/BSC Sit to Stand: Min assist, From elevated surface   Step pivot transfers: Min assist       General transfer comment: STS from elevated EOB and recliner with cues for  hand placement and min A for anterior weight shift and power up. Pt able to pivot from recliner to EOB with HHA for balance    Ambulation/Gait Ambulation/Gait assistance: Contact guard assist Gait Distance (Feet): 10 Feet Assistive device: Rolling walker (2 wheels) Gait Pattern/deviations: Step-through pattern, Trunk flexed, Decreased stride length       General Gait Details: Pt demonstrates fair stability with RW support. Cues for RW proximity and management and upright posture       Balance Overall balance assessment: Needs assistance Sitting-balance support: No upper extremity supported Sitting balance-Leahy Scale: Good Sitting balance - Comments: sitting EOB   Standing balance support: Bilateral upper extremity supported, During functional activity, Reliant on assistive device for balance Standing balance-Leahy Scale: Fair Standing balance comment: with RW support                            Cognition Arousal: Alert Behavior During Therapy: WFL for tasks assessed/performed Overall Cognitive Status: Within Functional Limits for tasks assessed                                          Exercises General Exercises - Lower Extremity Hip Flexion/Marching: AROM, Standing, Both, 10 reps    General Comments General comments (skin integrity, edema, etc.): Pt reporting mild dizziness with mobility. BP 105/66 sitting EOB      Pertinent Vitals/Pain Pain Assessment Pain Assessment: Faces Faces Pain Scale: Hurts little more Pain Location: B hips with  mobility Pain Descriptors / Indicators: Aching, Sore Pain Intervention(s): Limited activity within patient's tolerance, Monitored during session, Repositioned     PT Goals (current goals can now be found in the care plan section) Acute Rehab PT Goals Patient Stated Goal: to get better PT Goal Formulation: With patient Time For Goal Achievement: 05/01/23 Progress towards PT goals: Progressing toward  goals    Frequency    Min 1X/week       AM-PAC PT "6 Clicks" Mobility   Outcome Measure  Help needed turning from your back to your side while in a flat bed without using bedrails?: A Little Help needed moving from lying on your back to sitting on the side of a flat bed without using bedrails?: A Little Help needed moving to and from a bed to a chair (including a wheelchair)?: A Little Help needed standing up from a chair using your arms (e.g., wheelchair or bedside chair)?: A Little Help needed to walk in hospital room?: Total (>74ft) Help needed climbing 3-5 steps with a railing? : Total 6 Click Score: 14    End of Session Equipment Utilized During Treatment: Gait belt Activity Tolerance: Patient limited by fatigue;Patient tolerated treatment well Patient left: in bed;with call bell/phone within reach;with bed alarm set Nurse Communication: Mobility status PT Visit Diagnosis: Unsteadiness on feet (R26.81)     Time: 9147-8295 PT Time Calculation (min) (ACUTE ONLY): 27 min  Charges:    $Gait Training: 8-22 mins $Therapeutic Activity: 8-22 mins PT General Charges $$ ACUTE PT VISIT: 1 Visit                     Johny Shock, PTA Acute Rehabilitation Services Secure Chat Preferred  [phone]    Johny Shock 04/18/2023, 12:48 PM

## 2023-04-19 DIAGNOSIS — R338 Other retention of urine: Secondary | ICD-10-CM | POA: Diagnosis not present

## 2023-04-19 DIAGNOSIS — A419 Sepsis, unspecified organism: Secondary | ICD-10-CM | POA: Diagnosis not present

## 2023-04-19 DIAGNOSIS — I4821 Permanent atrial fibrillation: Secondary | ICD-10-CM | POA: Diagnosis not present

## 2023-04-19 DIAGNOSIS — K810 Acute cholecystitis: Secondary | ICD-10-CM | POA: Diagnosis not present

## 2023-04-19 LAB — GLUCOSE, CAPILLARY
Glucose-Capillary: 232 mg/dL — ABNORMAL HIGH (ref 70–99)
Glucose-Capillary: 286 mg/dL — ABNORMAL HIGH (ref 70–99)
Glucose-Capillary: 209 mg/dL — ABNORMAL HIGH (ref 70–99)
Glucose-Capillary: 292 mg/dL — ABNORMAL HIGH (ref 70–99)

## 2023-04-19 NOTE — Progress Notes (Signed)
PROGRESS NOTE    Angel Shelton  ZOX:[id number] DOB: Sep 07, 1955 DOA: 04/14/2023 PCP: Cleatis Polka., MD  Subjective: Pt seen and examined. Discussed removing foley catheter today.  No word if dtr(stephanie) has talked to patient about SNF choices. I did speak to stephanie yesterday. Unclear if pt remembers talking to dtr or not.  Pt without any prn ativan use with CIWA scores. Ativan has been stopped.   Hospital Course: HPI: Angel Shelton is a 67 y.o. male with medical history significant of HTN, atrial fibrillation on Eliquis, CVA, type 2 diabetes, history of renal mass s/p right partial nephrectomy who presents with abdominal pain.   Patient reports left lower quadrant abdominal pain that has radiated to his right abdomen since last week.  Has been weak and mostly been staying in bed.  Had decreased oral intake.  However has still been compliant with his medication including his antihypertensives.  He denies any nausea, vomiting or diarrhea.  Patient is on Eliquis for atrial fibrillation and took last dose today at 1 PM.   On arrival to ED, he was afebrile, BP of 70/58 on room air.   CBC with leukocytosis of 14.7, hemoglobin of 13.5, thrombocytopenia of 115.  Lactate initially elevated at 2.2   CMP with hyponatremia of 128, chloride of 97, CO2 of 18 with normal anion gap of 13 although trending up from prior.  Creatinine also elevated at 2.04 with unclear baseline. LFTs are within normal limits but with hyperbilirubinemia 2.9.  Lipase within normal limits.   CT abdomen chest with calcified gallstone noted within gallbladder lumen and another likely impacted gallstone within the gallbladder neck.  Findings concerning for acute cholecystitis.  There is also nonobstructive bilateral nephrolithiasis. Acute cholecystitis was confirmed on right upper quadrant ultrasound.   General surgery was consulted and recommend keeping n.p.o. at midnight for surgical intervention in the  morning.  He was started on IV Rocephin and Flagyl in the ED as well as given 3 L of normal saline bolus fluids.  Hospitalist consulted for admission.  Significant Events: Admitted 04/14/2023 for acute cholecystitis   Significant Labs: Admission WBC 14.7, lactic acid 2.2, Na 128  Significant Imaging Studies: Admission CT abd shows Cholelithiasis with acute cholecystitis. Associated 1.6 cm gallstone likely impacted within the gallbladder neck. No CT evidence of choledocholithiasis. Recommend surgical consultation. 2. Bowel thickening of the hepatic flexure and mild bowel wall thickening of the ascending colon in the setting of colonic diverticulosis. Finding likely reactive in etiology. Given slight focal irregular bowel thickening along the colonic diverticula, differential diagnosis includes much less likely concurrent acute diverticulitis. Consider colonoscopy status post treatment and status post complete resolution of inflammatory changes to exclude an underlying lesion. 3. Incidentally noted possible thinning of the gastric fundal wall. Limited evaluation due to under distension of the stomach. Recommend correlation with signs and symptoms of gastric ulceration. Consider outpatient endoscopy for direct visualization if clinically indicated. 4. Nonobstructive bilateral nephrolithiasis measuring up to 7 mm on the left and punctate on the right. 5. Status post partial right nephrectomy. 6. Prostate is prominent in size with associated urinary bladder distension with urine and mild urinary bladder wall trabeculations. Correlate with signs and symptoms of obstructive uropathy. 7.  Aortic Atherosclerosis (ICD10-I70.0)-severe. Admission RUQ U/S shows Cholelithiasis with evidence of acute cholecystitis   Antibiotic Therapy: Anti-infectives (From admission, onward)    Start     Dose/Rate Route Frequency Ordered Stop   04/15/23 1800  cefTRIAXone (ROCEPHIN) 2 g in  sodium chloride 0.9 % 100 mL IVPB         2 g 200 mL/hr over 30 Minutes Intravenous Every 24 hours 04/14/23 2341     04/15/23 0800  metroNIDAZOLE (FLAGYL) IVPB 500 mg        500 mg 100 mL/hr over 60 Minutes Intravenous 2 times daily 04/14/23 2345     04/14/23 2200  metroNIDAZOLE (FLAGYL) IVPB 500 mg  Status:  Discontinued        500 mg 100 mL/hr over 60 Minutes Intravenous 2 times daily 04/14/23 2341 04/14/23 2345   04/14/23 1645  cefTRIAXone (ROCEPHIN) 2 g in sodium chloride 0.9 % 100 mL IVPB       Placed in "And" Linked Group   2 g 200 mL/hr over 30 Minutes Intravenous  Once 04/14/23 1630 04/14/23 1805   04/14/23 1645  metroNIDAZOLE (FLAGYL) IVPB 500 mg       Placed in "And" Linked Group   500 mg 100 mL/hr over 60 Minutes Intravenous  Once 04/14/23 1630 04/14/23 1913       Procedures:   Consultants: General Surgery IR    Assessment and Plan: * Acute cholecystitis - s/p cholecystostomy drain placement on 04-16-2023. 04-14-2023  CT imaging with calcified gallstone noted within gallbladder lumen and another likely impacted gallstone within the gallbladder neck.  -General surgery consulted. Pt last took Eliquis at 1pm today. Will awaiting general surgery recommendation for timing of acute cholecystectomy.  Will keep n.p.o. past midnight. -Continue IV Rocephin and Flagyl  04-16-2023 general surgery has requested IR place cholecystostomy tube today. Continue IV ABX with rocephin, flagyl  04-17-2023 will change to po abx (augmentin). IR will contact patient to schedule outpatient imaging in 6 weeks. Will ask surgery if Eliquis can be restarted. I see no contraindications. Pt tolerated clear liquids for breakfast. Will advance to low fat diet.  04-18-2023 tolerating low fat diet.   04-19-2023 stable. On po augmentin and tolerating this well.  Sepsis with acute organ dysfunction (HCC) 04-16-2023 present on admission. WBC 14.7, lactic acid 2.2, acute cholecystitis seen on CT abd 04-17-2023 resolved. WBC down to 8.7. no  fevers.   Hypotension 04-14-2023 BP of 70/58 on presentation. Now normotensive with 3L of NS bolus -Continue to hold antihypertensives overnight -Can resume lisinopril-HCTZ, diltiazem, metoprolol and Jardiance in the morning  04-16-2023 resolved. Continue to hold antihypertensives.  04-19-2023 stable  Acute urinary retention 04-16-2023 continue with foley catheter. Continue flomax and proscar.  04-18-2023 pt wants to keep foley today. Will attempt foley removal tomorrow so he doesn't have to go to SNF with foley.  04-19-2023 will remove foley today. Check post-void residual with bladder scan q-shift. If he retains >250 ml of urine with post-void residual bladder scan, he will need foley catheter re-inserted and outpatient voiding trial with urology office.  CKD stage 3a, GFR 45-59 ml/min (HCC) - baseline SCr 1.5 04-16-2023 continue IVF while NPO.  04-17-2023 Scr back down to 1.55. which is his baseline.  Lactic acidosis 04-14-2023 -Secondary to dehydration -has received 3L NS bolus  04-16-2023 resolved.  Abnormal CT of the abdomen 04-14-2023- CT a/p with Bowel thickening of the hepatic flexure and mild bowel wall thickening of the ascending colon in the setting of colonic diverticulosis. Finding likely reactive in  etiology. Given slight focal irregular bowel thickening along the colonic diverticula, differential diagnosis includes much less likely concurrent acute diverticulitis. Consider colonoscopy  status post treatment and status post complete resolution of inflammatory changes to exclude an underlying  lesion. -there was also findings of possible thickening of the gastric fundal wall but patient has no signs of symptoms of gastric ulceration  04-16-2023 - will need outpatient GI referral for EGD/colonoscopy.  Thrombocytopenia (HCC) 04-14-2023 -likely reactive  04-16-2023 stable @ 95K  04-17-2023 improved. Plt cnt 113K today  04-19-2023 will repeat CBC in AM.  Chronic  hyponatremia - Baseline around 128 04-14-2023 -due to dehydration. -Received 3 L of normal saline bolus. - Follow repeat in the morning  04-16-2023 chronic. Baseline around 128  04-17-2023 Na 124. Likely due to low solute intake last 24-48 hours. Pt placed back on low fat diet today.  04-18-2023 Na up to 127 after starting back on low fat diet. TSH is normal at 3.79  04-19-2023 pt tolerating low fat diet. Will repeat BMP in AM.  History of CVA (cerebrovascular accident) 04-14-2023 -has already taken dose of aspirin today. Hold pending possible surgery tomorrow  04-16-2023 stable.  Atrial fibrillation, permanent (HCC) 04-14-2023  Resume diltiazem and metoprolol tomorrow due to hypotension on presentation  04-16-2023 stable. Continue to hold cardizem and lopressor due to normal BP.  04-17-2023 had some fast HR last night. Cardizem-CD and Toprol-XL restarted last night. Will continue and monitor pt's HR. Keep K >4 and Mg >2.0. give po kcl and IV mag today. Restarting eliquis today.  04-18-2023 discussed with pharmacy. Apparently pt was in research trial regarding asa vs eliquis vs placebo. Unclear which research arm pt was in.  Neurology researcher(Sethi) contacted. Pt to hold eliquis during hospitalization. Pt to restart research trial drug at discharge.  04-19-2023 remains on Toprol-xl 100 mg daily, Cardizem CD daily. Rate controlled. Pharmacy going to find out if Eliquis can be restarted or if pt can go back on Research trial drug when he is at [facility].  Neurology wants to keep him off eliquis for now.  Dyslipidemia associated with type 2 diabetes mellitus (HCC) 04-14-2023 Continue statin and fish oil  04-16-2023 continue crestor 40 mg every day, lovaza every day, fenofibrate 160 mg qd  DM2 (diabetes mellitus, type 2) (HCC) 04-14-2023  A1c last year of 6.2.  No hypoglycemia on presentation. -Holding Jardiance due to hypotension -on 10 unit of Toujeo at home -place on SSI  04-16-2023  continue SSI.  Hypomagnesemia 04-17-2023 Mg 1.6 today. Will give 2 grams IV mag  04-18-2023 resolved. Mg 1.9   DVT prophylaxis: SCDs Start: 04/14/23 2319    Code Status: Full Code Family Communication: no family at bedside. Spoke with dtr stephanie yesterday. She was suppose to call patient and discuss SNF choice with pt and CM Disposition Plan: SNF Reason for continuing need for hospitalization: medically stable for DC.  Objective: Vitals:   04/18/23 1900 04/18/23 2300 04/19/23 0500 04/19/23 0736  BP: 116/70 107/67 96/65 99/63   Pulse: 89 76 84 95  Resp: 18 18 18 18   Temp: 97.6 F (36.4 C) 98.2 F (36.8 C) 98.1 F (36.7 C) 98.4 F (36.9 C)  TempSrc: Oral Oral Oral   SpO2: 97% 95% 95% 97%  Weight:      Height:        Intake/Output Summary (Last 24 hours) at 04/19/2023 1017 Last data filed at 04/19/2023 0947 Gross per 24 hour  Intake --  Output 4090 ml  Net -4090 ml   Filed Weights   04/15/23 0122 04/16/23 0503  Weight: 77.2 kg 78.5 kg    Examination:  Physical Exam Vitals and nursing note reviewed.  Constitutional:      General: He is  not in acute distress.    Appearance: He is not toxic-appearing or diaphoretic.  HENT:     Head: Normocephalic and atraumatic.     Nose: Nose normal.  Eyes:     General: No scleral icterus. Cardiovascular:     Rate and Rhythm: Normal rate.  Pulmonary:     Effort: Pulmonary effort is normal.     Breath sounds: Normal breath sounds.  Abdominal:     General: Bowel sounds are normal.  Musculoskeletal:     Right lower leg: No edema.     Left lower leg: No edema.  Skin:    General: Skin is warm and dry.     Capillary Refill: Capillary refill takes less than 2 seconds.  Neurological:     Mental Status: He is alert.     Data Reviewed: I have personally reviewed following labs and imaging studies  CBC: Recent Labs  Lab 04/14/23 1423 04/15/23 0654 04/16/23 0626 04/17/23 0554  WBC 14.7* 10.0 8.8 8.7  HGB 13.5 12.2*  11.8* 11.5*  HCT 40.0 34.5* 33.9* 32.6*  MCV 94.1 92.5 92.1 89.6  PLT 115* 102* 95* 113*   Basic Metabolic Panel: Recent Labs  Lab 04/14/23 1535 04/14/23 2339 04/15/23 0654 04/16/23 0626 04/17/23 0554 04/18/23 0623  NA 128*  --  128* 126* 124* 127*  K 3.5  --  3.4* 3.5 3.7 4.3  CL 97*  --  97* 96* 95* 97*  CO2 18*  --  21* 21* 23 23  GLUCOSE 70  --  94 90 111* 107*  BUN 31*  --  31* 23 22 26*  CREATININE 2.04*  --  2.10* 1.75* 1.55* 1.56*  CALCIUM 8.4*  --  9.3 9.4 9.7 10.4*  MG  --  1.6*  --  1.8 1.6* 1.9  PHOS  --  3.6  --  1.9* 2.3*  --    GFR: Estimated Creatinine Clearance: 46.8 mL/min (A) (by C-G formula based on SCr of 1.56 mg/dL (H)). Liver Function Tests: Recent Labs  Lab 04/14/23 1535 04/15/23 1040 04/16/23 0626 04/17/23 0554 04/18/23 0623  AST 35 51* 42* 29 38  ALT 20 23 21 20 20   ALKPHOS 39 55 49 46 46  BILITOT 2.9* 1.3* 1.2* 0.9 1.3*  PROT 4.7* 5.3* 4.8* 4.5* 4.8*  ALBUMIN 2.2* 2.2* 1.9* 1.9* 2.0*   Recent Labs  Lab 04/14/23 1535 04/16/23 0626 04/17/23 0554  LIPASE 51 61* 52*    Coagulation Profile: Recent Labs  Lab 04/15/23 1040 04/16/23 0626 04/17/23 0554  INR 1.6* 1.6* 1.4*   CBG: Recent Labs  Lab 04/18/23 0739 04/18/23 1130 04/18/23 1544 04/18/23 2130 04/19/23 0857  GLUCAP 106* 138* 212* 302* 232*   Thyroid Function Tests: Recent Labs    04/18/23 0621  TSH 3.794  FREET4 1.15*   Sepsis Labs: Recent Labs  Lab 04/14/23 1437 04/14/23 1643  LATICACIDVEN 2.2* 1.6    Recent Results (from the past 240 hour(s))  Aerobic/Anaerobic Culture w Gram Stain (surgical/deep wound)     Status: None (Preliminary result)   Collection Time: 04/16/23 12:23 PM   Specimen: Abscess  Result Value Ref Range Status   Specimen Description ABSCESS  Final   Special Requests NONE  Final   Gram Stain   Final    RARE WBC PRESENT,BOTH PMN AND MONONUCLEAR MODERATE GRAM NEGATIVE RODS RARE GRAM POSITIVE RODS RARE BUDDING YEAST SEEN    Culture    Final    ABUNDANT KLEBSIELLA PNEUMONIAE SUSCEPTIBILITIES TO FOLLOW ABUNDANT  GRAM NEGATIVE RODS IDENTIFICATION AND SUSCEPTIBILITIES TO FOLLOW CULTURE REINCUBATED FOR BETTER GROWTH Performed at [facility], [address], [place], [place] [address]    Report Status PENDING  Incomplete     Radiology Studies: No results found.  Scheduled Meds:  amoxicillin-clavulanate  1 tablet Oral Q12H   Chlorhexidine Gluconate Cloth  6 each Topical Daily   diltiazem  240 mg Oral Daily   feeding supplement  237 mL Oral BID BM   fenofibrate  160 mg Oral Daily   finasteride  5 mg Oral Daily   folic acid  1 mg Oral Daily   gabapentin  300 mg Oral TID   insulin aspart  0-9 Units Subcutaneous TID WC   Lotilaner  1 drop Both Eyes BID   metoprolol succinate  100 mg Oral Daily   multivitamin with minerals  1 tablet Oral Daily   omega-3 acid ethyl esters  1 g Oral Daily   pantoprazole  40 mg Oral QAC breakfast   rosuvastatin  40 mg Oral Daily   saccharomyces boulardii  250 mg Oral BID   sodium chloride flush  5 mL Intracatheter Q8H   tamsulosin  0.4 mg Oral QPC supper   thiamine  100 mg Oral Daily   Or   thiamine  100 mg Intravenous Daily   Continuous Infusions:   LOS: 5 days   Time spent: 45 minutes  Carollee Herter, DO  [facility]  04/19/2023, 10:17 AM

## 2023-04-19 NOTE — Progress Notes (Signed)
Mobility Specialist Progress Note    04/19/23 0941  Mobility  Activity Ambulated with assistance in room  Level of Assistance Minimal assist, patient does 75% or more  Assistive Device Front wheel walker  Distance Ambulated (ft) 12 ft  Activity Response Tolerated fair  Mobility Referral Yes  $Mobility charge 1 Mobility  Mobility Specialist Start Time (ACUTE ONLY) [id number]  Mobility Specialist Stop Time (ACUTE ONLY) 0940  Mobility Specialist Time Calculation (min) (ACUTE ONLY) 12 min   Pt received in bed and agreeable. MinA for bed mobility and to stand. C/o feeling lightheaded once to sink, so returned to bed. Left with call bell in reach and bed alarm on. RN notified.   [place] Mobility Specialist  Please Neurosurgeon or Rehab Office at [phone]

## 2023-04-20 DIAGNOSIS — K810 Acute cholecystitis: Secondary | ICD-10-CM | POA: Diagnosis not present

## 2023-04-20 DIAGNOSIS — I4821 Permanent atrial fibrillation: Secondary | ICD-10-CM | POA: Diagnosis not present

## 2023-04-20 DIAGNOSIS — R338 Other retention of urine: Secondary | ICD-10-CM | POA: Diagnosis not present

## 2023-04-20 DIAGNOSIS — A419 Sepsis, unspecified organism: Secondary | ICD-10-CM | POA: Diagnosis not present

## 2023-04-20 LAB — CBC WITH DIFFERENTIAL/PLATELET
Abs Immature Granulocytes: 0.2 10*3/uL — ABNORMAL HIGH (ref 0.00–0.07)
Basophils Absolute: 0 10*3/uL (ref 0.0–0.1)
Basophils Relative: 1 %
Eosinophils Absolute: 0.2 10*3/uL (ref 0.0–0.5)
Eosinophils Relative: 3 %
HCT: 38.6 % — ABNORMAL LOW (ref 39.0–52.0)
Hemoglobin: 13.2 g/dL (ref 13.0–17.0)
Immature Granulocytes: 3 %
Lymphocytes Relative: 17 %
Lymphs Abs: 1.4 10*3/uL (ref 0.7–4.0)
MCH: 31.3 pg (ref 26.0–34.0)
MCHC: 34.2 g/dL (ref 30.0–36.0)
MCV: 91.5 fL (ref 80.0–100.0)
Monocytes Absolute: 0.7 10*3/uL (ref 0.1–1.0)
Monocytes Relative: 8 %
Neutro Abs: 5.6 10*3/uL (ref 1.7–7.7)
Neutrophils Relative %: 68 %
Platelets: 252 10*3/uL (ref 150–400)
RBC: 4.22 MIL/uL (ref 4.22–5.81)
RDW: 14.8 % (ref 11.5–15.5)
WBC: 8.1 10*3/uL (ref 4.0–10.5)
nRBC: 0 % (ref 0.0–0.2)

## 2023-04-20 LAB — BASIC METABOLIC PANEL
Anion gap: 10 (ref 5–15)
BUN: 27 mg/dL — ABNORMAL HIGH (ref 8–23)
CO2: 24 mmol/L (ref 22–32)
Calcium: 11.1 mg/dL — ABNORMAL HIGH (ref 8.9–10.3)
Chloride: 90 mmol/L — ABNORMAL LOW (ref 98–111)
Creatinine, Ser: 1.51 mg/dL — ABNORMAL HIGH (ref 0.61–1.24)
GFR, Estimated: 50 mL/min — ABNORMAL LOW (ref >60)
Glucose, Bld: 312 mg/dL — ABNORMAL HIGH (ref 70–99)
Potassium: 4 mmol/L (ref 3.5–5.1)
Sodium: 124 mmol/L — ABNORMAL LOW (ref 135–145)

## 2023-04-20 LAB — GLUCOSE, CAPILLARY
Glucose-Capillary: 226 mg/dL — ABNORMAL HIGH (ref 70–99)
Glucose-Capillary: 217 mg/dL — ABNORMAL HIGH (ref 70–99)
Glucose-Capillary: 226 mg/dL — ABNORMAL HIGH (ref 70–99)
Glucose-Capillary: 250 mg/dL — ABNORMAL HIGH (ref 70–99)

## 2023-04-20 MED ORDER — GLIPIZIDE 2.5 MG HALF TABLET: 2.5000 mg | ORAL_TABLET | Freq: Two times a day (BID) | ORAL | Status: DC

## 2023-04-20 MED ORDER — INSULIN GLARGINE-YFGN 100 UNIT/ML ~~LOC~~ SOLN
10.0000 [IU] | Freq: Every day | SUBCUTANEOUS | Status: DC
  Administered 2023-04-20 – 2023-04-22 (×3): 10 [IU] via SUBCUTANEOUS
  Filled 2023-04-20 (×3): qty 0.1

## 2023-04-20 MED ORDER — ASPIRIN 81 MG PO TBEC
81.0000 mg | DELAYED_RELEASE_TABLET | Freq: Every day | ORAL | Status: DC
  Administered 2023-04-20 – 2023-04-23 (×4): 81 mg via ORAL
  Filled 2023-04-20 (×4): qty 1

## 2023-04-20 NOTE — Progress Notes (Signed)
PROGRESS NOTE    Angel Shelton  ZOX:[id number] DOB: Aug 23, 1955 DOA: 04/14/2023 PCP: Cleatis Polka., MD  Subjective: Pt seen and examined. Foley removed yesterday. Pt unable to void. Had to have foley re-inserted. No complaints. Awaiting SNF discharge.   Hospital Course: HPI: Angel Shelton is a 67 y.o. male with medical history significant of HTN, atrial fibrillation on Eliquis, CVA, type 2 diabetes, history of renal mass s/p right partial nephrectomy who presents with abdominal pain.   Patient reports left lower quadrant abdominal pain that has radiated to his right abdomen since last week.  Has been weak and mostly been staying in bed.  Had decreased oral intake.  However has still been compliant with his medication including his antihypertensives.  He denies any nausea, vomiting or diarrhea.  Patient is on Eliquis for atrial fibrillation and took last dose today at 1 PM.   On arrival to ED, he was afebrile, BP of 70/58 on room air.   CBC with leukocytosis of 14.7, hemoglobin of 13.5, thrombocytopenia of 115.  Lactate initially elevated at 2.2   CMP with hyponatremia of 128, chloride of 97, CO2 of 18 with normal anion gap of 13 although trending up from prior.  Creatinine also elevated at 2.04 with unclear baseline. LFTs are within normal limits but with hyperbilirubinemia 2.9.  Lipase within normal limits.   CT abdomen chest with calcified gallstone noted within gallbladder lumen and another likely impacted gallstone within the gallbladder neck.  Findings concerning for acute cholecystitis.  There is also nonobstructive bilateral nephrolithiasis. Acute cholecystitis was confirmed on right upper quadrant ultrasound.   General surgery was consulted and recommend keeping n.p.o. at midnight for surgical intervention in the morning.  He was started on IV Rocephin and Flagyl in the ED as well as given 3 L of normal saline bolus fluids.  Hospitalist consulted for  admission.  Significant Events: Admitted 04/14/2023 for acute cholecystitis   Significant Labs: Admission WBC 14.7, lactic acid 2.2, Na 128  Significant Imaging Studies: Admission CT abd shows Cholelithiasis with acute cholecystitis. Associated 1.6 cm gallstone likely impacted within the gallbladder neck. No CT evidence of choledocholithiasis. Recommend surgical consultation. 2. Bowel thickening of the hepatic flexure and mild bowel wall thickening of the ascending colon in the setting of colonic diverticulosis. Finding likely reactive in etiology. Given slight focal irregular bowel thickening along the colonic diverticula, differential diagnosis includes much less likely concurrent acute diverticulitis. Consider colonoscopy status post treatment and status post complete resolution of inflammatory changes to exclude an underlying lesion. 3. Incidentally noted possible thinning of the gastric fundal wall. Limited evaluation due to under distension of the stomach. Recommend correlation with signs and symptoms of gastric ulceration. Consider outpatient endoscopy for direct visualization if clinically indicated. 4. Nonobstructive bilateral nephrolithiasis measuring up to 7 mm on the left and punctate on the right. 5. Status post partial right nephrectomy. 6. Prostate is prominent in size with associated urinary bladder distension with urine and mild urinary bladder wall trabeculations. Correlate with signs and symptoms of obstructive uropathy. 7.  Aortic Atherosclerosis (ICD10-I70.0)-severe. Admission RUQ U/S shows Cholelithiasis with evidence of acute cholecystitis   Antibiotic Therapy: Anti-infectives (From admission, onward)    Start     Dose/Rate Route Frequency Ordered Stop   04/15/23 1800  cefTRIAXone (ROCEPHIN) 2 g in sodium chloride 0.9 % 100 mL IVPB        2 g 200 mL/hr over 30 Minutes Intravenous Every 24 hours 04/14/23 2341  04/15/23 0800  metroNIDAZOLE (FLAGYL) IVPB 500 mg        500  mg 100 mL/hr over 60 Minutes Intravenous 2 times daily 04/14/23 2345     04/14/23 2200  metroNIDAZOLE (FLAGYL) IVPB 500 mg  Status:  Discontinued        500 mg 100 mL/hr over 60 Minutes Intravenous 2 times daily 04/14/23 2341 04/14/23 2345   04/14/23 1645  cefTRIAXone (ROCEPHIN) 2 g in sodium chloride 0.9 % 100 mL IVPB       Placed in "And" Linked Group   2 g 200 mL/hr over 30 Minutes Intravenous  Once 04/14/23 1630 04/14/23 1805   04/14/23 1645  metroNIDAZOLE (FLAGYL) IVPB 500 mg       Placed in "And" Linked Group   500 mg 100 mL/hr over 60 Minutes Intravenous  Once 04/14/23 1630 04/14/23 1913       Procedures:   Consultants: General Surgery IR    Assessment and Plan: * Acute cholecystitis - s/p cholecystostomy drain placement on 04-16-2023. 04-14-2023  CT imaging with calcified gallstone noted within gallbladder lumen and another likely impacted gallstone within the gallbladder neck.  -General surgery consulted. Pt last took Eliquis at 1pm today. Will awaiting general surgery recommendation for timing of acute cholecystectomy.  Will keep n.p.o. past midnight. -Continue IV Rocephin and Flagyl  04-16-2023 general surgery has requested IR place cholecystostomy tube today. Continue IV ABX with rocephin, flagyl  04-17-2023 will change to po abx (augmentin). IR will contact patient to schedule outpatient imaging in 6 weeks. Will ask surgery if Eliquis can be restarted. I see no contraindications. Pt tolerated clear liquids for breakfast. Will advance to low fat diet.  04-18-2023 tolerating low fat diet.   04-19-2023 stable. On po augmentin and tolerating this well.  Acute urinary retention 04-16-2023 continue with foley catheter. Continue flomax and proscar.  04-18-2023 pt wants to keep foley today. Will attempt foley removal tomorrow so he doesn't have to go to SNF with foley.  04-19-2023 will remove foley today. Check post-void residual with bladder scan q-shift. If he retains  >250 ml of urine with post-void residual bladder scan, he will need foley catheter re-inserted and outpatient voiding trial with urology office.  04-20-2023 foley removed yesterday. Due to urinary retention, pt need foley re-inserted. He will go to SNF with foley and will need outpatient voiding trial in urology office in 4 weeks.  Sepsis with acute organ dysfunction (HCC) 04-16-2023 present on admission. WBC 14.7, lactic acid 2.2, acute cholecystitis seen on CT abd 04-17-2023 resolved. WBC down to 8.7. no fevers.   Hypotension 04-14-2023 BP of 70/58 on presentation. Now normotensive with 3L of NS bolus -Continue to hold antihypertensives overnight -Can resume lisinopril-HCTZ, diltiazem, metoprolol and Jardiance in the morning  04-16-2023 resolved. Continue to hold antihypertensives.  04-19-2023 stable  CKD stage 3a, GFR 45-59 ml/min (HCC) - baseline SCr 1.5 04-16-2023 continue IVF while NPO.  04-17-2023 Scr back down to 1.55. which is his baseline.  Lactic acidosis 04-14-2023 -Secondary to dehydration -has received 3L NS bolus  04-16-2023 resolved.  Abnormal CT of the abdomen 04-14-2023- CT a/p with Bowel thickening of the hepatic flexure and mild bowel wall thickening of the ascending colon in the setting of colonic diverticulosis. Finding likely reactive in  etiology. Given slight focal irregular bowel thickening along the colonic diverticula, differential diagnosis includes much less likely concurrent acute diverticulitis. Consider colonoscopy  status post treatment and status post complete resolution of inflammatory changes to exclude an  underlying lesion. -there was also findings of possible thickening of the gastric fundal wall but patient has no signs of symptoms of gastric ulceration  04-16-2023 - will need outpatient GI referral for EGD/colonoscopy.  Thrombocytopenia (HCC) 04-14-2023 -likely reactive  04-16-2023 stable @ 95K  04-17-2023 improved. Plt cnt 113K  today  04-19-2023 will repeat CBC in AM.  04-20-2023 awaiting CBC  Chronic hyponatremia - Baseline around 128 04-14-2023 -due to dehydration. -Received 3 L of normal saline bolus. - Follow repeat in the morning  04-16-2023 chronic. Baseline around 128  04-17-2023 Na 124. Likely due to low solute intake last 24-48 hours. Pt placed back on low fat diet today.  04-18-2023 Na up to 127 after starting back on low fat diet. TSH is normal at 3.79  04-19-2023 pt tolerating low fat diet. Will repeat BMP in AM.  04-20-2023 awaiting BMP  History of CVA (cerebrovascular accident) 04-14-2023 -has already taken dose of aspirin today. Hold pending possible surgery tomorrow  04-16-2023 stable.  04-20-2023 restart ASA 81 mg daily.  Atrial fibrillation, permanent (HCC) 04-14-2023  Resume diltiazem and metoprolol tomorrow due to hypotension on presentation  04-16-2023 stable. Continue to hold cardizem and lopressor due to normal BP.  04-17-2023 had some fast HR last night. Cardizem-CD and Toprol-XL restarted last night. Will continue and monitor pt's HR. Keep K >4 and Mg >2.0. give po kcl and IV mag today. Restarting eliquis today.  04-18-2023 discussed with pharmacy. Apparently pt was in research trial regarding asa vs eliquis vs placebo. Unclear which research arm pt was in.  Neurology researcher(Sethi) contacted. Pt to hold eliquis during hospitalization. Pt to restart research trial drug at discharge.  04-19-2023 remains on Toprol-xl 100 mg daily, Cardizem CD daily. Rate controlled. Pharmacy going to find out if Eliquis can be restarted or if pt can go back on Research trial drug when he is at [facility].  Neurology wants to keep him off eliquis for now.  Dyslipidemia associated with type 2 diabetes mellitus (HCC) 04-14-2023 Continue statin and fish oil  04-16-2023 continue crestor 40 mg every day, lovaza every day, fenofibrate 160 mg qd  DM2 (diabetes mellitus, type 2) (HCC) 04-14-2023  A1c  last year of 6.2.  No hypoglycemia on presentation. -Holding Jardiance due to hypotension -on 10 unit of Toujeo at home -place on SSI  04-16-2023 continue SSI.  04-20-2023  Restart long acting insulin lantus 10 units qday  Hypomagnesemia 04-17-2023 Mg 1.6 today. Will give 2 grams IV mag  04-18-2023 resolved. Mg 1.9       DVT prophylaxis: SCDs Start: 04/14/23 2319     Code Status: Full Code Family Communication: no family at bedside Disposition Plan: SNF Reason for continuing need for hospitalization: medically stable for DC  Objective: Vitals:   04/19/23 1957 04/20/23 0409 04/20/23 0719 04/20/23 0842  BP: 100/67 101/82 117/67   Pulse: 98 (!) 106 (!) 45 100  Resp: 16 16 18    Temp:  98.1 F (36.7 C) 98 F (36.7 C)   TempSrc:      SpO2: 96% 97% 97% 97%  Weight:      Height:        Intake/Output Summary (Last 24 hours) at 04/20/2023 0959 Last data filed at 04/20/2023 0716 Gross per 24 hour  Intake 200 ml  Output 2920 ml  Net -2720 ml   Filed Weights   04/15/23 0122 04/16/23 0503  Weight: 77.2 kg 78.5 kg    Examination:  Physical Exam Vitals and nursing note reviewed.  Constitutional:      General: He is not in acute distress.    Appearance: He is not toxic-appearing or diaphoretic.  HENT:     Head: Normocephalic and atraumatic.     Nose: Nose normal.  Cardiovascular:     Rate and Rhythm: Normal rate.  Pulmonary:     Effort: Pulmonary effort is normal.  Abdominal:     General: Bowel sounds are normal.     Palpations: Abdomen is soft.  Musculoskeletal:     Right lower leg: No edema.     Left lower leg: No edema.  Skin:    General: Skin is warm and dry.     Capillary Refill: Capillary refill takes less than 2 seconds.  Neurological:     General: No focal deficit present.     Data Reviewed: I have personally reviewed following labs and imaging studies  CBC: Recent Labs  Lab 04/14/23 1423 04/15/23 0654 04/16/23 0626 04/17/23 0554  WBC  14.7* 10.0 8.8 8.7  HGB 13.5 12.2* 11.8* 11.5*  HCT 40.0 34.5* 33.9* 32.6*  MCV 94.1 92.5 92.1 89.6  PLT 115* 102* 95* 113*   Basic Metabolic Panel: Recent Labs  Lab 04/14/23 1535 04/14/23 2339 04/15/23 0654 04/16/23 0626 04/17/23 0554 04/18/23 0623  NA 128*  --  128* 126* 124* 127*  K 3.5  --  3.4* 3.5 3.7 4.3  CL 97*  --  97* 96* 95* 97*  CO2 18*  --  21* 21* 23 23  GLUCOSE 70  --  94 90 111* 107*  BUN 31*  --  31* 23 22 26*  CREATININE 2.04*  --  2.10* 1.75* 1.55* 1.56*  CALCIUM 8.4*  --  9.3 9.4 9.7 10.4*  MG  --  1.6*  --  1.8 1.6* 1.9  PHOS  --  3.6  --  1.9* 2.3*  --    GFR: Estimated Creatinine Clearance: 46.8 mL/min (A) (by C-G formula based on SCr of 1.56 mg/dL (H)). Liver Function Tests: Recent Labs  Lab 04/14/23 1535 04/15/23 1040 04/16/23 0626 04/17/23 0554 04/18/23 0623  AST 35 51* 42* 29 38  ALT 20 23 21 20 20   ALKPHOS 39 55 49 46 46  BILITOT 2.9* 1.3* 1.2* 0.9 1.3*  PROT 4.7* 5.3* 4.8* 4.5* 4.8*  ALBUMIN 2.2* 2.2* 1.9* 1.9* 2.0*   Recent Labs  Lab 04/14/23 1535 04/16/23 0626 04/17/23 0554  LIPASE 51 61* 52*    Coagulation Profile: Recent Labs  Lab 04/15/23 1040 04/16/23 0626 04/17/23 0554  INR 1.6* 1.6* 1.4*   CBG: Recent Labs  Lab 04/19/23 0857 04/19/23 1147 04/19/23 1729 04/19/23 2134 04/20/23 0719  GLUCAP 232* 286* 209* 292* 226*   Thyroid Function Tests: Recent Labs    04/18/23 0621  TSH 3.794  FREET4 1.15*   Sepsis Labs: Recent Labs  Lab 04/14/23 1437 04/14/23 1643  LATICACIDVEN 2.2* 1.6    Recent Results (from the past 240 hour(s))  Aerobic/Anaerobic Culture w Gram Stain (surgical/deep wound)     Status: None (Preliminary result)   Collection Time: 04/16/23 12:23 PM   Specimen: Abscess  Result Value Ref Range Status   Specimen Description ABSCESS  Final   Special Requests NONE  Final   Gram Stain   Final    RARE WBC PRESENT,BOTH PMN AND MONONUCLEAR MODERATE GRAM NEGATIVE RODS RARE GRAM POSITIVE  RODS RARE BUDDING YEAST SEEN    Culture   Final    ABUNDANT KLEBSIELLA PNEUMONIAE GRAM NEGATIVE RODS PHOCAEICOLA VULGATUS  BETA LACTAMASE POSITIVE REPEATING IDENTIFICATION AND SUSCEPTIBILITIES ON ISOLATE TWO Performed at [facility], [address], [place], [place] [address]    Report Status PENDING  Incomplete   Organism ID, Bacteria KLEBSIELLA PNEUMONIAE  Final      Susceptibility   Klebsiella pneumoniae - MIC*    AMPICILLIN RESISTANT Resistant     CEFEPIME <=0.12 SENSITIVE Sensitive     CEFTAZIDIME <=1 SENSITIVE Sensitive     CEFTRIAXONE <=0.25 SENSITIVE Sensitive     CIPROFLOXACIN <=0.25 SENSITIVE Sensitive     GENTAMICIN <=1 SENSITIVE Sensitive     IMIPENEM <=0.25 SENSITIVE Sensitive     TRIMETH/SULFA <=20 SENSITIVE Sensitive     AMPICILLIN/SULBACTAM <=2 SENSITIVE Sensitive     PIP/TAZO <=4 SENSITIVE Sensitive ug/mL    * ABUNDANT KLEBSIELLA PNEUMONIAE     Radiology Studies: No results found.  Scheduled Meds:  amoxicillin-clavulanate  1 tablet Oral Q12H   aspirin EC  81 mg Oral Daily   Chlorhexidine Gluconate Cloth  6 each Topical Daily   diltiazem  240 mg Oral Daily   feeding supplement  237 mL Oral BID BM   fenofibrate  160 mg Oral Daily   finasteride  5 mg Oral Daily   folic acid  1 mg Oral Daily   gabapentin  300 mg Oral TID   insulin aspart  0-9 Units Subcutaneous TID WC   insulin glargine-yfgn  10 Units Subcutaneous Daily   Lotilaner  1 drop Both Eyes BID   metoprolol succinate  100 mg Oral Daily   multivitamin with minerals  1 tablet Oral Daily   omega-3 acid ethyl esters  1 g Oral Daily   pantoprazole  40 mg Oral QAC breakfast   rosuvastatin  40 mg Oral Daily   saccharomyces boulardii  250 mg Oral BID   sodium chloride flush  5 mL Intracatheter Q8H   tamsulosin  0.4 mg Oral QPC supper   thiamine  100 mg Oral Daily   Or   thiamine  100 mg Intravenous Daily   Continuous Infusions:   LOS: 6 days   Time spent: 35 minutes  Carollee Herter,  DO  [facility]  04/20/2023, 9:59 AM

## 2023-04-20 NOTE — Progress Notes (Signed)
Mobility Specialist Progress Note    04/20/23 0902  Mobility  Activity Ambulated with assistance in room  Level of Assistance Minimal assist, patient does 75% or more  Assistive Device Front wheel walker  Distance Ambulated (ft) 40 ft  Activity Response Tolerated well  Mobility Referral Yes  $Mobility charge 1 Mobility  Mobility Specialist Start Time (ACUTE ONLY) 0849  Mobility Specialist Stop Time (ACUTE ONLY) 0901  Mobility Specialist Time Calculation (min) (ACUTE ONLY) 12 min   Pt received in bed and agreeable. No complaints on walk. Returned to chair with call bell in reach. Chair alarm on. RN notified.   [place] Mobility Specialist  Please Neurosurgeon or Rehab Office at [phone]

## 2023-04-21 DIAGNOSIS — K810 Acute cholecystitis: Secondary | ICD-10-CM | POA: Diagnosis not present

## 2023-04-21 DIAGNOSIS — R338 Other retention of urine: Secondary | ICD-10-CM | POA: Diagnosis not present

## 2023-04-21 DIAGNOSIS — I4821 Permanent atrial fibrillation: Secondary | ICD-10-CM | POA: Diagnosis not present

## 2023-04-21 DIAGNOSIS — E871 Hypo-osmolality and hyponatremia: Secondary | ICD-10-CM | POA: Diagnosis not present

## 2023-04-21 LAB — AEROBIC/ANAEROBIC CULTURE W GRAM STAIN (SURGICAL/DEEP WOUND)

## 2023-04-21 LAB — GLUCOSE, CAPILLARY
Glucose-Capillary: 192 mg/dL — ABNORMAL HIGH (ref 70–99)
Glucose-Capillary: 180 mg/dL — ABNORMAL HIGH (ref 70–99)
Glucose-Capillary: 137 mg/dL — ABNORMAL HIGH (ref 70–99)
Glucose-Capillary: 246 mg/dL — ABNORMAL HIGH (ref 70–99)

## 2023-04-21 NOTE — Progress Notes (Signed)
Physical Therapy Treatment Patient Details Name: Angel Shelton MRN: [id number] DOB: 04-24-1956 Today's Date: 04/21/2023   History of Present Illness Angel Shelton is a 67 y.o. malewho presents with abdominal pain.  Cholecystitis and received chole drain.PMH:  HTN, atrial fibrillation on Eliquis, CVA, type 2 diabetes, history of renal mass s/p right partial nephrectomy    PT Comments  Pt received in supine and agreeable to session. Pt requires use of momentum and up to min A for bed mobility. Pt reports dizziness once sitting EOB with noted drop in BP. Pt returns to supine with some improvement in dizziness and is then agreeable to transfer to recliner. Pt able to stand and pivot to recliner with CGA for safety, however demonstrates good stability with RW support. Pt continues to benefit from PT services to progress toward functional mobility goals.    If plan is discharge home, recommend the following: A lot of help with walking and/or transfers;A little help with bathing/dressing/bathroom;Assistance with cooking/housework;Assist for transportation;Help with stairs or ramp for entrance   Can travel by private vehicle     No  Equipment Recommendations  Rolling walker (2 wheels);BSC/3in1    Recommendations for Other Services       Precautions / Restrictions Precautions Precautions: Fall Restrictions Weight Bearing Restrictions: No     Mobility  Bed Mobility Overal bed mobility: Needs Assistance Bed Mobility: Supine to Sit, Sit to Supine     Supine to sit: HOB elevated, Used rails, Contact guard, Min assist Sit to supine: Contact guard assist   General bed mobility comments: increased time and use of momentum for trunk elevation. Min A for trunk elevation during second trial    Transfers Overall transfer level: Needs assistance Equipment used: Rolling walker (2 wheels) Transfers: Sit to/from Stand, Bed to chair/wheelchair/BSC Sit to Stand: Contact guard assist    Step pivot transfers: Contact guard assist       General transfer comment: STS from elevated EOB with cues for hand placement    Ambulation/Gait               General Gait Details: unable due to low BP       Balance Overall balance assessment: Needs assistance Sitting-balance support: No upper extremity supported Sitting balance-Leahy Scale: Good Sitting balance - Comments: sitting EOB   Standing balance support: Bilateral upper extremity supported, During functional activity, Reliant on assistive device for balance Standing balance-Leahy Scale: Fair Standing balance comment: with RW support                            Cognition Arousal: Alert Behavior During Therapy: WFL for tasks assessed/performed Overall Cognitive Status: Within Functional Limits for tasks assessed                                          Exercises      General Comments General comments (skin integrity, edema, etc.): Pt reports dizziness once sitting EOB. BP 85/60 sitting EOB and 89/56 in recliner at end of session.      Pertinent Vitals/Pain Pain Assessment Pain Assessment: Faces Pain Score: 0-No pain     PT Goals (current goals can now be found in the care plan section) Acute Rehab PT Goals Patient Stated Goal: to get better PT Goal Formulation: With patient Time For Goal Achievement: 05/01/23 Progress towards PT goals:  Progressing toward goals (slowly due to low BP)    Frequency    Min 1X/week       AM-PAC PT "6 Clicks" Mobility   Outcome Measure  Help needed turning from your back to your side while in a flat bed without using bedrails?: A Little Help needed moving from lying on your back to sitting on the side of a flat bed without using bedrails?: A Little Help needed moving to and from a bed to a chair (including a wheelchair)?: A Little Help needed standing up from a chair using your arms (e.g., wheelchair or bedside chair)?: A  Little Help needed to walk in hospital room?: A Little Help needed climbing 3-5 steps with a railing? : Total 6 Click Score: 16    End of Session Equipment Utilized During Treatment: Gait belt Activity Tolerance: Patient tolerated treatment well;Other (comment) (low BP) Patient left: in chair;with chair alarm set;with call bell/phone within reach Nurse Communication: Mobility status PT Visit Diagnosis: Unsteadiness on feet (R26.81)     Time: 1610-9604 PT Time Calculation (min) (ACUTE ONLY): 23 min  Charges:    $Therapeutic Activity: 23-37 mins PT General Charges $$ ACUTE PT VISIT: 1 Visit                     Johny Shock, PTA Acute Rehabilitation Services Secure Chat Preferred  [phone]    Johny Shock 04/21/2023, 10:22 AM

## 2023-04-21 NOTE — Progress Notes (Signed)
Mobility Specialist Progress Note:    04/21/23 1408  Mobility  Activity Dangled on edge of bed  Level of Assistance Standby assist, set-up cues, supervision of patient - no hands on  Assistive Device None  Range of Motion/Exercises Active;All extremities  Activity Response Tolerated well  Mobility Referral Yes  $Mobility charge 1 Mobility  Mobility Specialist Start Time (ACUTE ONLY) 1400  Mobility Specialist Stop Time (ACUTE ONLY) 1408  Mobility Specialist Time Calculation (min) (ACUTE ONLY) 8 min   Pt received in bed, agreeable to mobility session. Recorded vitals below. Tolerated well, deferred ambulation d/t low BP. Returned pt to supine position in bed, all needs met, phone and call bell in reach.   Supine: 96/60 (73), HR 70 bpm EOB: 76/64 (70), HR 97 bpm Post Mobility (Supine): 108/65 (78), HR 85 bpm   Angel Shelton Mobility Specialist Please contact via Special educational needs teacher or  Rehab office at [phone]

## 2023-04-21 NOTE — Progress Notes (Signed)
PROGRESS NOTE    Angel Shelton  HYW:[id number] DOB: 1956-03-18 DOA: 04/14/2023 PCP: Cleatis Polka., MD  Subjective: Pt seen and examined. Pt remains with foley Still has not picked SNF yet. Reminded pt he needs to pick SNF today Otherwise stable for DC.   Hospital Course: HPI: Angel Shelton is a 67 y.o. male with medical history significant of HTN, atrial fibrillation on Eliquis, CVA, type 2 diabetes, history of renal mass s/p right partial nephrectomy who presents with abdominal pain.   Patient reports left lower quadrant abdominal pain that has radiated to his right abdomen since last week.  Has been weak and mostly been staying in bed.  Had decreased oral intake.  However has still been compliant with his medication including his antihypertensives.  He denies any nausea, vomiting or diarrhea.  Patient is on Eliquis for atrial fibrillation and took last dose today at 1 PM.   On arrival to ED, he was afebrile, BP of 70/58 on room air.   CBC with leukocytosis of 14.7, hemoglobin of 13.5, thrombocytopenia of 115.  Lactate initially elevated at 2.2   CMP with hyponatremia of 128, chloride of 97, CO2 of 18 with normal anion gap of 13 although trending up from prior.  Creatinine also elevated at 2.04 with unclear baseline. LFTs are within normal limits but with hyperbilirubinemia 2.9.  Lipase within normal limits.   CT abdomen chest with calcified gallstone noted within gallbladder lumen and another likely impacted gallstone within the gallbladder neck.  Findings concerning for acute cholecystitis.  There is also nonobstructive bilateral nephrolithiasis. Acute cholecystitis was confirmed on right upper quadrant ultrasound.   General surgery was consulted and recommend keeping n.p.o. at midnight for surgical intervention in the morning.  He was started on IV Rocephin and Flagyl in the ED as well as given 3 L of normal saline bolus fluids.  Hospitalist consulted for  admission.  Significant Events: Admitted 04/14/2023 for acute cholecystitis   Significant Labs: Admission WBC 14.7, lactic acid 2.2, Na 128  Significant Imaging Studies: Admission CT abd shows Cholelithiasis with acute cholecystitis. Associated 1.6 cm gallstone likely impacted within the gallbladder neck. No CT evidence of choledocholithiasis. Recommend surgical consultation. 2. Bowel thickening of the hepatic flexure and mild bowel wall thickening of the ascending colon in the setting of colonic diverticulosis. Finding likely reactive in etiology. Given slight focal irregular bowel thickening along the colonic diverticula, differential diagnosis includes much less likely concurrent acute diverticulitis. Consider colonoscopy status post treatment and status post complete resolution of inflammatory changes to exclude an underlying lesion. 3. Incidentally noted possible thinning of the gastric fundal wall. Limited evaluation due to under distension of the stomach. Recommend correlation with signs and symptoms of gastric ulceration. Consider outpatient endoscopy for direct visualization if clinically indicated. 4. Nonobstructive bilateral nephrolithiasis measuring up to 7 mm on the left and punctate on the right. 5. Status post partial right nephrectomy. 6. Prostate is prominent in size with associated urinary bladder distension with urine and mild urinary bladder wall trabeculations. Correlate with signs and symptoms of obstructive uropathy. 7.  Aortic Atherosclerosis (ICD10-I70.0)-severe. Admission RUQ U/S shows Cholelithiasis with evidence of acute cholecystitis   Antibiotic Therapy: Anti-infectives (From admission, onward)    Start     Dose/Rate Route Frequency Ordered Stop   04/15/23 1800  cefTRIAXone (ROCEPHIN) 2 g in sodium chloride 0.9 % 100 mL IVPB        2 g 200 mL/hr over 30 Minutes Intravenous Every 24  hours 04/14/23 2341     04/15/23 0800  metroNIDAZOLE (FLAGYL) IVPB 500 mg        500  mg 100 mL/hr over 60 Minutes Intravenous 2 times daily 04/14/23 2345     04/14/23 2200  metroNIDAZOLE (FLAGYL) IVPB 500 mg  Status:  Discontinued        500 mg 100 mL/hr over 60 Minutes Intravenous 2 times daily 04/14/23 2341 04/14/23 2345   04/14/23 1645  cefTRIAXone (ROCEPHIN) 2 g in sodium chloride 0.9 % 100 mL IVPB       Placed in "And" Linked Group   2 g 200 mL/hr over 30 Minutes Intravenous  Once 04/14/23 1630 04/14/23 1805   04/14/23 1645  metroNIDAZOLE (FLAGYL) IVPB 500 mg       Placed in "And" Linked Group   500 mg 100 mL/hr over 60 Minutes Intravenous  Once 04/14/23 1630 04/14/23 1913       Procedures:   Consultants: General Surgery IR    Assessment and Plan: * Acute cholecystitis - s/p cholecystostomy drain placement on 04-16-2023. 04-14-2023  CT imaging with calcified gallstone noted within gallbladder lumen and another likely impacted gallstone within the gallbladder neck.  -General surgery consulted. Pt last took Eliquis at 1pm today. Will awaiting general surgery recommendation for timing of acute cholecystectomy.  Will keep n.p.o. past midnight. -Continue IV Rocephin and Flagyl  04-16-2023 general surgery has requested IR place cholecystostomy tube today. Continue IV ABX with rocephin, flagyl  04-17-2023 will change to po abx (augmentin). IR will contact patient to schedule outpatient imaging in 6 weeks. Will ask surgery if Eliquis can be restarted. I see no contraindications. Pt tolerated clear liquids for breakfast. Will advance to low fat diet.  04-18-2023 tolerating low fat diet.   04-19-2023 stable. On po augmentin and tolerating this well.  04-21-2023 stable. On augmentin.  Acute urinary retention 04-16-2023 continue with foley catheter. Continue flomax and proscar.  04-18-2023 pt wants to keep foley today. Will attempt foley removal tomorrow so he doesn't have to go to SNF with foley.  04-19-2023 will remove foley today. Check post-void residual with  bladder scan q-shift. If he retains >250 ml of urine with post-void residual bladder scan, he will need foley catheter re-inserted and outpatient voiding trial with urology office.  04-20-2023 foley removed yesterday. Due to urinary retention, pt need foley re-inserted. He will go to SNF with foley and will need outpatient voiding trial in urology office in 4 weeks.  04-21-2023 will DC to SNF with foley. Will need outpatient urology office voiding trial. Will need f/u appointment with urology in 4 weeks.  Sepsis with acute organ dysfunction (HCC) 04-16-2023 present on admission. WBC 14.7, lactic acid 2.2, acute cholecystitis seen on CT abd 04-17-2023 resolved. WBC down to 8.7. no fevers.   Hypotension 04-14-2023 BP of 70/58 on presentation. Now normotensive with 3L of NS bolus -Continue to hold antihypertensives overnight -Can resume lisinopril-HCTZ, diltiazem, metoprolol and Jardiance in the morning  04-16-2023 resolved. Continue to hold antihypertensives.  04-19-2023 stable  04-21-2023 stable on Toprol-XL and cardizem-CD 240 mg daily. Both for his afib.  CKD stage 3a, GFR 45-59 ml/min (HCC) - baseline SCr 1.5 04-16-2023 continue IVF while NPO.  04-17-2023 Scr back down to 1.55. which is his baseline.  04-21-2023 stable. Scr 1.51 yesterday.  Lactic acidosis 04-14-2023 -Secondary to dehydration -has received 3L NS bolus  04-16-2023 resolved.  Abnormal CT of the abdomen 04-14-2023- CT a/p with Bowel thickening of the hepatic flexure and  mild bowel wall thickening of the ascending colon in the setting of colonic diverticulosis. Finding likely reactive in  etiology. Given slight focal irregular bowel thickening along the colonic diverticula, differential diagnosis includes much less likely concurrent acute diverticulitis. Consider colonoscopy  status post treatment and status post complete resolution of inflammatory changes to exclude an underlying lesion. -there was also findings of  possible thickening of the gastric fundal wall but patient has no signs of symptoms of gastric ulceration  04-16-2023 - will need outpatient GI referral for EGD/colonoscopy.  Thrombocytopenia (HCC) 04-14-2023 -likely reactive  04-16-2023 stable @ 95K  04-17-2023 improved. Plt cnt 113K today  04-19-2023 will repeat CBC in AM.  04-20-2023 awaiting CBC  04-21-2023 resolved. Plt count 252 yesterday.  Chronic hyponatremia - Baseline around 128 04-14-2023 -due to dehydration. -Received 3 L of normal saline bolus. - Follow repeat in the morning  04-16-2023 chronic. Baseline around 128  04-17-2023 Na 124. Likely due to low solute intake last 24-48 hours. Pt placed back on low fat diet today.  04-18-2023 Na up to 127 after starting back on low fat diet. TSH is normal at 3.79  04-19-2023 pt tolerating low fat diet. Will repeat BMP in AM.  04-20-2023 awaiting BMP  04-21-2023 Na 124 yesterday. Chronic.  History of CVA (cerebrovascular accident) 04-14-2023 -has already taken dose of aspirin today. Hold pending possible surgery tomorrow  04-16-2023 stable.  04-20-2023 restart ASA 81 mg daily.  Atrial fibrillation, permanent (HCC) 04-14-2023  Resume diltiazem and metoprolol tomorrow due to hypotension on presentation  04-16-2023 stable. Continue to hold cardizem and lopressor due to normal BP.  04-17-2023 had some fast HR last night. Cardizem-CD and Toprol-XL restarted last night. Will continue and monitor pt's HR. Keep K >4 and Mg >2.0. give po kcl and IV mag today. Restarting eliquis today.  04-18-2023 discussed with pharmacy. Apparently pt was in research trial regarding asa vs eliquis vs placebo. Unclear which research arm pt was in.  Neurology researcher(Sethi) contacted. Pt to hold eliquis during hospitalization. Pt to restart research trial drug at discharge.  04-19-2023 remains on Toprol-xl 100 mg daily, Cardizem CD daily. Rate controlled. Pharmacy going to find out if  Eliquis can be restarted or if pt can go back on Research trial drug when he is at [facility].  Neurology wants to keep him off eliquis for now.  04-21-2023 restarted ASA 81 mg daily. I'm making executive decision on pt's care and will restart eliquis at discharge. Dtr states pt had a stroke when off Eliquis in the past.  Dyslipidemia associated with type 2 diabetes mellitus (HCC) 04-14-2023 Continue statin and fish oil  04-16-2023 continue crestor 40 mg every day, lovaza every day, fenofibrate 160 mg every day  04-21-2023 stable.  DM2 (diabetes mellitus, type 2) (HCC) 04-14-2023  A1c last year of 6.2.  No hypoglycemia on presentation. -Holding Jardiance due to hypotension -on 10 unit of Toujeo at home -place on SSI  04-16-2023 continue SSI.  04-20-2023  Restart long acting insulin lantus 10 units qday  04-21-2023 CBG improved. Continue to monitor on 10 u lantus and SSI.  Hypomagnesemia 04-17-2023 Mg 1.6 today. Will give 2 grams IV mag  04-18-2023 resolved. Mg 1.9       DVT prophylaxis: SCDs Start: 04/14/23 2319    Code Status: Full Code Family Communication: no family at bedside Disposition Plan: SNF Reason for continuing need for hospitalization: medically stable for DC.  Objective: Vitals:   04/20/23 1930 04/20/23 1931 04/21/23 0328 04/21/23 1610  BP: 104/61 104/61 114/72 102/60  Pulse: 81 91 82 80  Resp: 16 16 17    Temp: 98.9 F (37.2 C) 98.9 F (37.2 C) 98.5 F (36.9 C) 98.6 F (37 C)  TempSrc:   Axillary   SpO2: 98% 97% 100% 98%  Weight:      Height:        Intake/Output Summary (Last 24 hours) at 04/21/2023 0946 Last data filed at 04/21/2023 0500 Gross per 24 hour  Intake --  Output 2595 ml  Net -2595 ml   Filed Weights   04/15/23 0122 04/16/23 0503  Weight: 77.2 kg 78.5 kg    Examination:  Physical Exam Vitals and nursing note reviewed.  Constitutional:      General: He is not in acute distress.    Appearance: He is not toxic-appearing or  diaphoretic.  HENT:     Head: Normocephalic and atraumatic.     Nose: Nose normal.  Cardiovascular:     Rate and Rhythm: Normal rate and regular rhythm.  Pulmonary:     Effort: Pulmonary effort is normal.     Breath sounds: Normal breath sounds.  Abdominal:     General: Bowel sounds are normal.     Palpations: Abdomen is soft.     Comments: +perc drain  Musculoskeletal:     Right lower leg: No edema.     Left lower leg: No edema.  Skin:    General: Skin is warm and dry.     Capillary Refill: Capillary refill takes less than 2 seconds.  Neurological:     Mental Status: He is alert and oriented to person, place, and time.     Data Reviewed: I have personally reviewed following labs and imaging studies  CBC: Recent Labs  Lab 04/14/23 1423 04/15/23 0654 04/16/23 0626 04/17/23 0554 04/20/23 1011  WBC 14.7* 10.0 8.8 8.7 8.1  NEUTROABS  --   --   --   --  5.6  HGB 13.5 12.2* 11.8* 11.5* 13.2  HCT 40.0 34.5* 33.9* 32.6* 38.6*  MCV 94.1 92.5 92.1 89.6 91.5  PLT 115* 102* 95* 113* 252   Basic Metabolic Panel: Recent Labs  Lab 04/14/23 2339 04/15/23 0654 04/16/23 0626 04/17/23 0554 04/18/23 0623 04/20/23 1011  NA  --  128* 126* 124* 127* 124*  K  --  3.4* 3.5 3.7 4.3 4.0  CL  --  97* 96* 95* 97* 90*  CO2  --  21* 21* 23 23 24   GLUCOSE  --  94 90 111* 107* 312*  BUN  --  31* 23 22 26* 27*  CREATININE  --  2.10* 1.75* 1.55* 1.56* 1.51*  CALCIUM  --  9.3 9.4 9.7 10.4* 11.1*  MG 1.6*  --  1.8 1.6* 1.9  --   PHOS 3.6  --  1.9* 2.3*  --   --    GFR: Estimated Creatinine Clearance: 48.3 mL/min (A) (by C-G formula based on SCr of 1.51 mg/dL (H)). Liver Function Tests: Recent Labs  Lab 04/14/23 1535 04/15/23 1040 04/16/23 0626 04/17/23 0554 04/18/23 0623  AST 35 51* 42* 29 38  ALT 20 23 21 20 20   ALKPHOS 39 55 49 46 46  BILITOT 2.9* 1.3* 1.2* 0.9 1.3*  PROT 4.7* 5.3* 4.8* 4.5* 4.8*  ALBUMIN 2.2* 2.2* 1.9* 1.9* 2.0*   Recent Labs  Lab 04/14/23 1535  04/16/23 0626 04/17/23 0554  LIPASE 51 61* 52*    Coagulation Profile: Recent Labs  Lab 04/15/23 1040 04/16/23 6644  04/17/23 0554  INR 1.6* 1.6* 1.4*   CBG: Recent Labs  Lab 04/20/23 0719 04/20/23 1234 04/20/23 1521 04/20/23 2117 04/21/23 0823  GLUCAP 226* 217* 226* 250* 192*    Sepsis Labs: Recent Labs  Lab 04/14/23 1437 04/14/23 1643  LATICACIDVEN 2.2* 1.6    Recent Results (from the past 240 hour(s))  Aerobic/Anaerobic Culture w Gram Stain (surgical/deep wound)     Status: None (Preliminary result)   Collection Time: 04/16/23 12:23 PM   Specimen: Abscess  Result Value Ref Range Status   Specimen Description ABSCESS  Final   Special Requests NONE  Final   Gram Stain   Final    RARE WBC PRESENT,BOTH PMN AND MONONUCLEAR MODERATE GRAM NEGATIVE RODS RARE GRAM POSITIVE RODS RARE BUDDING YEAST SEEN    Culture   Final    ABUNDANT KLEBSIELLA PNEUMONIAE ABUNDANT ESCHERICHIA COLI PHOCAEICOLA VULGATUS BETA LACTAMASE POSITIVE FAILED TO GROW FOR SUSCEPTIBILITY TESTING ESCHERICHIA COLI CALL MICROBIOLOGY LAB IF SENSITIVITIES ARE REQUIRED. Performed at [facility], [address], [place], [place] [address]    Report Status PENDING  Incomplete   Organism ID, Bacteria KLEBSIELLA PNEUMONIAE  Final      Susceptibility   Klebsiella pneumoniae - MIC*    AMPICILLIN RESISTANT Resistant     CEFEPIME <=0.12 SENSITIVE Sensitive     CEFTAZIDIME <=1 SENSITIVE Sensitive     CEFTRIAXONE <=0.25 SENSITIVE Sensitive     CIPROFLOXACIN <=0.25 SENSITIVE Sensitive     GENTAMICIN <=1 SENSITIVE Sensitive     IMIPENEM <=0.25 SENSITIVE Sensitive     TRIMETH/SULFA <=20 SENSITIVE Sensitive     AMPICILLIN/SULBACTAM <=2 SENSITIVE Sensitive     PIP/TAZO <=4 SENSITIVE Sensitive ug/mL    * ABUNDANT KLEBSIELLA PNEUMONIAE     Radiology Studies: No results found.  Scheduled Meds:  amoxicillin-clavulanate  1 tablet Oral Q12H   aspirin EC  81 mg Oral Daily   Chlorhexidine Gluconate  Cloth  6 each Topical Daily   diltiazem  240 mg Oral Daily   feeding supplement  237 mL Oral BID BM   fenofibrate  160 mg Oral Daily   finasteride  5 mg Oral Daily   folic acid  1 mg Oral Daily   gabapentin  300 mg Oral TID   insulin aspart  0-9 Units Subcutaneous TID WC   insulin glargine-yfgn  10 Units Subcutaneous Daily   Lotilaner  1 drop Both Eyes BID   metoprolol succinate  100 mg Oral Daily   omega-3 acid ethyl esters  1 g Oral Daily   pantoprazole  40 mg Oral QAC breakfast   rosuvastatin  40 mg Oral Daily   saccharomyces boulardii  250 mg Oral BID   sodium chloride flush  5 mL Intracatheter Q8H   tamsulosin  0.4 mg Oral QPC supper   thiamine  100 mg Oral Daily   Or   thiamine  100 mg Intravenous Daily   Continuous Infusions:   LOS: 7 days   Time spent: 35 minutes  Carollee Herter, DO  [facility]  04/21/2023, 9:46 AM

## 2023-04-22 DIAGNOSIS — E871 Hypo-osmolality and hyponatremia: Secondary | ICD-10-CM | POA: Diagnosis not present

## 2023-04-22 DIAGNOSIS — I4821 Permanent atrial fibrillation: Secondary | ICD-10-CM | POA: Diagnosis not present

## 2023-04-22 DIAGNOSIS — Z794 Long term (current) use of insulin: Secondary | ICD-10-CM

## 2023-04-22 DIAGNOSIS — R338 Other retention of urine: Secondary | ICD-10-CM | POA: Diagnosis not present

## 2023-04-22 DIAGNOSIS — K810 Acute cholecystitis: Secondary | ICD-10-CM | POA: Diagnosis not present

## 2023-04-22 LAB — GLUCOSE, CAPILLARY
Glucose-Capillary: 239 mg/dL — ABNORMAL HIGH (ref 70–99)
Glucose-Capillary: 285 mg/dL — ABNORMAL HIGH (ref 70–99)
Glucose-Capillary: 212 mg/dL — ABNORMAL HIGH (ref 70–99)
Glucose-Capillary: 246 mg/dL — ABNORMAL HIGH (ref 70–99)

## 2023-04-22 MED ORDER — INSULIN GLARGINE-YFGN 100 UNIT/ML ~~LOC~~ SOLN
5.0000 [IU] | Freq: Once | SUBCUTANEOUS | Status: AC
  Administered 2023-04-22: 5 [IU] via SUBCUTANEOUS
  Filled 2023-04-22: qty 0.05

## 2023-04-22 MED ORDER — INSULIN GLARGINE-YFGN 100 UNIT/ML ~~LOC~~ SOLN
15.0000 [IU] | Freq: Every day | SUBCUTANEOUS | Status: DC
  Administered 2023-04-23: 15 [IU] via SUBCUTANEOUS
  Filled 2023-04-22: qty 0.15

## 2023-04-22 MED ORDER — APIXABAN 5 MG PO TABS
5.0000 mg | ORAL_TABLET | Freq: Two times a day (BID) | ORAL | Status: DC
  Administered 2023-04-22 – 2023-04-23 (×3): 5 mg via ORAL
  Filled 2023-04-22 (×3): qty 1

## 2023-04-22 NOTE — Progress Notes (Addendum)
Mobility Specialist Progress Note:   04/22/23 0902  Orthostatic Lying   BP- Lying 92/67  Pulse- Lying 90  Orthostatic Sitting  BP- Sitting (!) 82/60  Pulse- Sitting 63  Mobility  Activity Dangled on edge of bed  Level of Assistance Standby assist, set-up cues, supervision of patient - no hands on  Assistive Device None  Range of Motion/Exercises Active;All extremities  Activity Response Tolerated well  Mobility Referral Yes  $Mobility charge 1 Mobility  Mobility Specialist Start Time (ACUTE ONLY) [phone]  Mobility Specialist Stop Time (ACUTE ONLY) 0845  Mobility Specialist Time Calculation (min) (ACUTE ONLY) 10 min   Pt received in bed, agreeable to mobility session. Took BP throughout session. Unable to stand at bedside d/t pt reporting blurry vision and severe dizziness stating "the black circle is getting bigger" after sitting EOB. Assisted pt back to bed, all needs met, call bell in reach, and bed alarm.  [name] Mobility Specialist Please contact via Special educational needs teacher or  Rehab office at [phone]

## 2023-04-22 NOTE — Progress Notes (Signed)
PROGRESS NOTE    DAREON SORDEN  VQQ:[id number] DOB: [birth year] DOA: 04/14/2023 PCP: Cleatis Polka., MD  Subjective: Pt seen and examined. Pt has picked facility [facility]. Awaiting insurance authorization.  BP dropped with PT. Will stop cardizem     Hospital Course: HPI: SHIVIN MCLARTY is a 67 y.o. male with medical history significant of HTN, atrial fibrillation on Eliquis, CVA, type 2 diabetes, history of renal mass s/p right partial nephrectomy who presents with abdominal pain.   Patient reports left lower quadrant abdominal pain that has radiated to his right abdomen since last week.  Has been weak and mostly been staying in bed.  Had decreased oral intake.  However has still been compliant with his medication including his antihypertensives.  He denies any nausea, vomiting or diarrhea.  Patient is on Eliquis for atrial fibrillation and took last dose today at 1 PM.   On arrival to ED, he was afebrile, BP of 70/58 on room air.   CBC with leukocytosis of 14.7, hemoglobin of 13.5, thrombocytopenia of 115.  Lactate initially elevated at 2.2   CMP with hyponatremia of 128, chloride of 97, CO2 of 18 with normal anion gap of 13 although trending up from prior.  Creatinine also elevated at 2.04 with unclear baseline. LFTs are within normal limits but with hyperbilirubinemia 2.9.  Lipase within normal limits.   CT abdomen chest with calcified gallstone noted within gallbladder lumen and another likely impacted gallstone within the gallbladder neck.  Findings concerning for acute cholecystitis.  There is also nonobstructive bilateral nephrolithiasis. Acute cholecystitis was confirmed on right upper quadrant ultrasound.   General surgery was consulted and recommend keeping n.p.o. at midnight for surgical intervention in the morning.  He was started on IV Rocephin and Flagyl in the ED as well as given 3 L of normal saline bolus fluids.  Hospitalist consulted for  admission.  Significant Events: Admitted 04/14/2023 for acute cholecystitis   Significant Labs: Admission WBC 14.7, lactic acid 2.2, Na 128  Significant Imaging Studies: Admission CT abd shows Cholelithiasis with acute cholecystitis. Associated 1.6 cm gallstone likely impacted within the gallbladder neck. No CT evidence of choledocholithiasis. Recommend surgical consultation. 2. Bowel thickening of the hepatic flexure and mild bowel wall thickening of the ascending colon in the setting of colonic diverticulosis. Finding likely reactive in etiology. Given slight focal irregular bowel thickening along the colonic diverticula, differential diagnosis includes much less likely concurrent acute diverticulitis. Consider colonoscopy status post treatment and status post complete resolution of inflammatory changes to exclude an underlying lesion. 3. Incidentally noted possible thinning of the gastric fundal wall. Limited evaluation due to under distension of the stomach. Recommend correlation with signs and symptoms of gastric ulceration. Consider outpatient endoscopy for direct visualization if clinically indicated. 4. Nonobstructive bilateral nephrolithiasis measuring up to 7 mm on the left and punctate on the right. 5. Status post partial right nephrectomy. 6. Prostate is prominent in size with associated urinary bladder distension with urine and mild urinary bladder wall trabeculations. Correlate with signs and symptoms of obstructive uropathy. 7.  Aortic Atherosclerosis (ICD10-I70.0)-severe. Admission RUQ U/S shows Cholelithiasis with evidence of acute cholecystitis   Antibiotic Therapy: Anti-infectives (From admission, onward)    Start     Dose/Rate Route Frequency Ordered Stop   04/15/23 1800  cefTRIAXone (ROCEPHIN) 2 g in sodium chloride 0.9 % 100 mL IVPB        2 g 200 mL/hr over 30 Minutes Intravenous Every 24 hours 04/14/23 2341  04/15/23 0800  metroNIDAZOLE (FLAGYL) IVPB 500 mg        500  mg 100 mL/hr over 60 Minutes Intravenous 2 times daily 04/14/23 2345     04/14/23 2200  metroNIDAZOLE (FLAGYL) IVPB 500 mg  Status:  Discontinued        500 mg 100 mL/hr over 60 Minutes Intravenous 2 times daily 04/14/23 2341 04/14/23 2345   04/14/23 1645  cefTRIAXone (ROCEPHIN) 2 g in sodium chloride 0.9 % 100 mL IVPB       Placed in "And" Linked Group   2 g 200 mL/hr over 30 Minutes Intravenous  Once 04/14/23 1630 04/14/23 1805   04/14/23 1645  metroNIDAZOLE (FLAGYL) IVPB 500 mg       Placed in "And" Linked Group   500 mg 100 mL/hr over 60 Minutes Intravenous  Once 04/14/23 1630 04/14/23 1913       Procedures:   Consultants: General Surgery IR    Assessment and Plan: * Acute cholecystitis - s/p cholecystostomy drain placement on 04-16-2023. 04-14-2023  CT imaging with calcified gallstone noted within gallbladder lumen and another likely impacted gallstone within the gallbladder neck.  -General surgery consulted. Pt last took Eliquis at 1pm today. Will awaiting general surgery recommendation for timing of acute cholecystectomy.  Will keep n.p.o. past midnight. -Continue IV Rocephin and Flagyl  04-16-2023 general surgery has requested IR place cholecystostomy tube today. Continue IV ABX with rocephin, flagyl  04-17-2023 will change to po abx (augmentin). IR will contact patient to schedule outpatient imaging in 6 weeks. Will ask surgery if Eliquis can be restarted. I see no contraindications. Pt tolerated clear liquids for breakfast. Will advance to low fat diet.  04-18-2023 tolerating low fat diet.   04-19-2023 stable. On po augmentin and tolerating this well.  04-21-2023 stable. On augmentin. Culture from cholecystostomy tube insertion growing klebsiella that is sensitive to unasyn.  04-22-2023 will give a total of 14 days of abx for acute cholecystitis including IV Abx and PO abx. He has 3 days of IV abx. He will be on a total of 11 days of augmentin. Have changed end date  of augmentin.  Acute urinary retention 04-16-2023 continue with foley catheter. Continue flomax and proscar.  04-18-2023 pt wants to keep foley today. Will attempt foley removal tomorrow so he doesn't have to go to SNF with foley.  04-19-2023 will remove foley today. Check post-void residual with bladder scan q-shift. If he retains >250 ml of urine with post-void residual bladder scan, he will need foley catheter re-inserted and outpatient voiding trial with urology office.  04-20-2023 foley removed yesterday. Due to urinary retention, pt need foley re-inserted. He will go to SNF with foley and will need outpatient voiding trial in urology office in 4 weeks.  04-21-2023 will DC to SNF with foley. Will need outpatient urology office voiding trial. Will need f/u appointment with urology in 4 weeks.  04-22-2023 TOC consult to get urology appointment scheduled in 4 weeks for voiding trial.  Sepsis with acute organ dysfunction (HCC) 04-16-2023 present on admission. WBC 14.7, lactic acid 2.2, acute cholecystitis seen on CT abd 04-17-2023 resolved. WBC down to 8.7. no fevers.   Hypotension 04-14-2023 BP of 70/58 on presentation. Now normotensive with 3L of NS bolus -Continue to hold antihypertensives overnight -Can resume lisinopril-HCTZ, diltiazem, metoprolol and Jardiance in the morning  04-16-2023 resolved. Continue to hold antihypertensives.  04-19-2023 stable  04-21-2023 stable on Toprol-XL and cardizem-CD 240 mg daily. Both for his afib.  04-22-2023  has drop in BP today with PT. Will stop cardizem-CD. Continue with Toprol-XL 100 mg daily only. Monitor HR.  CKD stage 3a, GFR 45-59 ml/min (HCC) - baseline SCr 1.5 04-16-2023 continue IVF while NPO.  04-17-2023 Scr back down to 1.55. which is his baseline.  04-21-2023 stable. Scr 1.51 yesterday.  Lactic acidosis 04-14-2023 -Secondary to dehydration -has received 3L NS bolus  04-16-2023 resolved.  Abnormal CT of the  abdomen 04-14-2023- CT a/p with Bowel thickening of the hepatic flexure and mild bowel wall thickening of the ascending colon in the setting of colonic diverticulosis. Finding likely reactive in  etiology. Given slight focal irregular bowel thickening along the colonic diverticula, differential diagnosis includes much less likely concurrent acute diverticulitis. Consider colonoscopy  status post treatment and status post complete resolution of inflammatory changes to exclude an underlying lesion. -there was also findings of possible thickening of the gastric fundal wall but patient has no signs of symptoms of gastric ulceration  04-16-2023 - will need outpatient GI referral for EGD/colonoscopy.  Thrombocytopenia (HCC) 04-14-2023 -likely reactive  04-16-2023 stable @ 95K  04-17-2023 improved. Plt cnt 113K today  04-19-2023 will repeat CBC in AM.  04-20-2023 awaiting CBC  04-21-2023 resolved. Plt count 252 yesterday.  Chronic hyponatremia - Baseline around 128 04-14-2023 -due to dehydration. -Received 3 L of normal saline bolus. - Follow repeat in the morning  04-16-2023 chronic. Baseline around 128  04-17-2023 Na 124. Likely due to low solute intake last 24-48 hours. Pt placed back on low fat diet today.  04-18-2023 Na up to 127 after starting back on low fat diet. TSH is normal at 3.79  04-19-2023 pt tolerating low fat diet. Will repeat BMP in AM.  04-20-2023 awaiting BMP  04-21-2023 Na 124 yesterday. Chronic.  History of CVA (cerebrovascular accident) 04-14-2023 -has already taken dose of aspirin today. Hold pending possible surgery tomorrow  04-16-2023 stable.  04-20-2023 restart ASA 81 mg daily.  04-22-2023 will restart Eliquis. Continue ASA. He has mild CAD by [facility] in 2016  Atrial fibrillation, permanent (HCC) 04-14-2023  Resume diltiazem and metoprolol tomorrow due to hypotension on presentation  04-16-2023 stable. Continue to hold cardizem and lopressor due to  normal BP.  04-17-2023 had some fast HR last night. Cardizem-CD and Toprol-XL restarted last night. Will continue and monitor pt's HR. Keep K >4 and Mg >2.0. give po kcl and IV mag today. Restarting eliquis today.  04-18-2023 discussed with pharmacy. Apparently pt was in research trial regarding asa vs eliquis vs placebo. Unclear which research arm pt was in.  Neurology researcher(Sethi) contacted. Pt to hold eliquis during hospitalization. Pt to restart research trial drug at discharge.  04-19-2023 remains on Toprol-xl 100 mg daily, Cardizem CD daily. Rate controlled. Pharmacy going to find out if Eliquis can be restarted or if pt can go back on Research trial drug when he is at [facility].  Neurology wants to keep him off eliquis for now.  04-21-2023 restarted ASA 81 mg daily. I'm making executive decision on pt's care and will restart eliquis at discharge. Dtr states pt had a stroke when off Eliquis in the past.  04-22-2023 pt dizzy while working with PT. BP borderline with SBP 103. Will stop Cardizem CD for now. As see how his rate control is doing. Restart eliquis.  Dyslipidemia associated with type 2 diabetes mellitus (HCC) 04-14-2023 Continue statin and fish oil  04-16-2023 continue crestor 40 mg every day, lovaza every day, fenofibrate 160 mg every day  04-21-2023 stable.  DM2 (diabetes mellitus, type 2) (HCC) 04-14-2023  A1c last year of 6.2.  No hypoglycemia on presentation. -Holding Jardiance due to hypotension -on 10 unit of Toujeo at home -place on SSI  04-16-2023 continue SSI.  04-20-2023  Restart long acting insulin lantus 10 units qday  04-21-2023 CBG improved. Continue to monitor on 10 u lantus and SSI.  04-22-2023 will increase lantus to 15 units qday. Will give extra 5 unit lantus today to give him a total of 15 units for today.  Hypomagnesemia 04-17-2023 Mg 1.6 today. Will give 2 grams IV mag  04-18-2023 resolved. Mg 1.9   DVT prophylaxis: SCDs Start: 04/14/23  2319 apixaban (ELIQUIS) tablet 5 mg     Code Status: Full Code Family Communication: no family at bedside Disposition Plan: SNF Reason for continuing need for hospitalization: awaiting SNF placement. Stable for DC.  Objective: Vitals:   04/21/23 1558 04/21/23 2053 04/22/23 0402 04/22/23 0811  BP: 103/61 (!) 91/54 103/62 103/63  Pulse: 75 66 71 90  Resp:  18 18   Temp: (!) 97.5 F (36.4 C) 97.9 F (36.6 C) 98.7 F (37.1 C) 98.1 F (36.7 C)  TempSrc:  Oral Oral   SpO2: 96% 99% 94% 97%  Weight:      Height:        Intake/Output Summary (Last 24 hours) at 04/22/2023 1010 Last data filed at 04/22/2023 0206 Gross per 24 hour  Intake --  Output 1620 ml  Net -1620 ml   Filed Weights   04/15/23 0122 04/16/23 0503  Weight: 77.2 kg 78.5 kg    Examination:  Physical Exam Vitals and nursing note reviewed.  Constitutional:      General: He is not in acute distress.    Appearance: He is normal weight.  HENT:     Head: Normocephalic and atraumatic.     Nose: Nose normal.  Eyes:     General: No scleral icterus. Cardiovascular:     Rate and Rhythm: Normal rate and regular rhythm.  Pulmonary:     Effort: Pulmonary effort is normal.     Breath sounds: Normal breath sounds.  Abdominal:     General: Bowel sounds are normal.     Palpations: Abdomen is soft.     Comments: + RUQ perc drainage tube  Musculoskeletal:     Right lower leg: No edema.     Left lower leg: No edema.  Skin:    General: Skin is warm and dry.     Capillary Refill: Capillary refill takes less than 2 seconds.  Neurological:     General: No focal deficit present.     Mental Status: He is alert.     Data Reviewed: I have personally reviewed following labs and imaging studies  CBC: Recent Labs  Lab 04/16/23 0626 04/17/23 0554 04/20/23 1011  WBC 8.8 8.7 8.1  NEUTROABS  --   --  5.6  HGB 11.8* 11.5* 13.2  HCT 33.9* 32.6* 38.6*  MCV 92.1 89.6 91.5  PLT 95* 113* 252   Basic Metabolic  Panel: Recent Labs  Lab 04/16/23 0626 04/17/23 0554 04/18/23 0623 04/20/23 1011  NA 126* 124* 127* 124*  K 3.5 3.7 4.3 4.0  CL 96* 95* 97* 90*  CO2 21* 23 23 24   GLUCOSE 90 111* 107* 312*  BUN 23 22 26* 27*  CREATININE 1.75* 1.55* 1.56* 1.51*  CALCIUM 9.4 9.7 10.4* 11.1*  MG 1.8 1.6* 1.9  --   PHOS 1.9* 2.3*  --   --  GFR: Estimated Creatinine Clearance: 48.3 mL/min (A) (by C-G formula based on SCr of 1.51 mg/dL (H)). Liver Function Tests: Recent Labs  Lab 04/15/23 1040 04/16/23 0626 04/17/23 0554 04/18/23 0623  AST 51* 42* 29 38  ALT 23 21 20 20   ALKPHOS 55 49 46 46  BILITOT 1.3* 1.2* 0.9 1.3*  PROT 5.3* 4.8* 4.5* 4.8*  ALBUMIN 2.2* 1.9* 1.9* 2.0*   Recent Labs  Lab 04/16/23 0626 04/17/23 0554  LIPASE 61* 52*    Coagulation Profile: Recent Labs  Lab 04/15/23 1040 04/16/23 0626 04/17/23 0554  INR 1.6* 1.6* 1.4*   CBG: Recent Labs  Lab 04/21/23 0823 04/21/23 1133 04/21/23 1602 04/21/23 2051 04/22/23 0811  GLUCAP 192* 180* 137* 246* 239*    Recent Results (from the past 240 hour(s))  Aerobic/Anaerobic Culture w Gram Stain (surgical/deep wound)     Status: None   Collection Time: 04/16/23 12:23 PM   Specimen: Abscess  Result Value Ref Range Status   Specimen Description ABSCESS  Final   Special Requests NONE  Final   Gram Stain   Final    RARE WBC PRESENT,BOTH PMN AND MONONUCLEAR MODERATE GRAM NEGATIVE RODS RARE GRAM POSITIVE RODS RARE BUDDING YEAST SEEN    Culture   Final    ABUNDANT KLEBSIELLA PNEUMONIAE ABUNDANT ESCHERICHIA COLI PHOCAEICOLA VULGATUS BETA LACTAMASE POSITIVE FAILED TO GROW FOR SUSCEPTIBILITY TESTING ESCHERICHIA COLI CALL MICROBIOLOGY LAB IF SENSITIVITIES ARE REQUIRED. Performed at [facility], [address] [place], [place] [address]    Report Status 04/21/2023 FINAL  Final   Organism ID, Bacteria KLEBSIELLA PNEUMONIAE  Final      Susceptibility   Klebsiella pneumoniae - MIC*    AMPICILLIN RESISTANT  Resistant     CEFEPIME <=0.12 SENSITIVE Sensitive     CEFTAZIDIME <=1 SENSITIVE Sensitive     CEFTRIAXONE <=0.25 SENSITIVE Sensitive     CIPROFLOXACIN <=0.25 SENSITIVE Sensitive     GENTAMICIN <=1 SENSITIVE Sensitive     IMIPENEM <=0.25 SENSITIVE Sensitive     TRIMETH/SULFA <=20 SENSITIVE Sensitive     AMPICILLIN/SULBACTAM <=2 SENSITIVE Sensitive     PIP/TAZO <=4 SENSITIVE Sensitive ug/mL    * ABUNDANT KLEBSIELLA PNEUMONIAE     Radiology Studies: No results found.  Scheduled Meds:  amoxicillin-clavulanate  1 tablet Oral Q12H   apixaban  5 mg Oral BID   aspirin EC  81 mg Oral Daily   Chlorhexidine Gluconate Cloth  6 each Topical Daily   feeding supplement  237 mL Oral BID BM   fenofibrate  160 mg Oral Daily   finasteride  5 mg Oral Daily   folic acid  1 mg Oral Daily   gabapentin  300 mg Oral TID   insulin aspart  0-9 Units Subcutaneous TID WC   [START ON 04/23/2023] insulin glargine-yfgn  15 Units Subcutaneous Daily   insulin glargine-yfgn  5 Units Subcutaneous Once   Lotilaner  1 drop Both Eyes BID   metoprolol succinate  100 mg Oral Daily   omega-3 acid ethyl esters  1 g Oral Daily   pantoprazole  40 mg Oral QAC breakfast   rosuvastatin  40 mg Oral Daily   saccharomyces boulardii  250 mg Oral BID   sodium chloride flush  5 mL Intracatheter Q8H   tamsulosin  0.4 mg Oral QPC supper   thiamine  100 mg Oral Daily   Or   thiamine  100 mg Intravenous Daily   Continuous Infusions:   LOS: 8 days   Time  spent: 35 minutes  Carollee Herter, DO  [facility]  04/22/2023, 10:10 AM

## 2023-04-23 DIAGNOSIS — E1169 Type 2 diabetes mellitus with other specified complication: Secondary | ICD-10-CM | POA: Diagnosis not present

## 2023-04-23 DIAGNOSIS — A4150 Gram-negative sepsis, unspecified: Secondary | ICD-10-CM | POA: Diagnosis not present

## 2023-04-23 DIAGNOSIS — K915 Postcholecystectomy syndrome: Secondary | ICD-10-CM | POA: Diagnosis not present

## 2023-04-23 DIAGNOSIS — R531 Weakness: Secondary | ICD-10-CM | POA: Diagnosis not present

## 2023-04-23 DIAGNOSIS — R2689 Other abnormalities of gait and mobility: Secondary | ICD-10-CM | POA: Diagnosis not present

## 2023-04-23 DIAGNOSIS — N1831 Chronic kidney disease, stage 3a: Secondary | ICD-10-CM | POA: Diagnosis not present

## 2023-04-23 DIAGNOSIS — R1084 Generalized abdominal pain: Secondary | ICD-10-CM | POA: Diagnosis not present

## 2023-04-23 DIAGNOSIS — K8000 Calculus of gallbladder with acute cholecystitis without obstruction: Secondary | ICD-10-CM | POA: Diagnosis not present

## 2023-04-23 DIAGNOSIS — K810 Acute cholecystitis: Secondary | ICD-10-CM | POA: Diagnosis not present

## 2023-04-23 DIAGNOSIS — M6259 Muscle wasting and atrophy, not elsewhere classified, multiple sites: Secondary | ICD-10-CM | POA: Diagnosis not present

## 2023-04-23 DIAGNOSIS — E1122 Type 2 diabetes mellitus with diabetic chronic kidney disease: Secondary | ICD-10-CM | POA: Diagnosis not present

## 2023-04-23 DIAGNOSIS — R338 Other retention of urine: Secondary | ICD-10-CM | POA: Diagnosis not present

## 2023-04-23 DIAGNOSIS — Z7401 Bed confinement status: Secondary | ICD-10-CM | POA: Diagnosis not present

## 2023-04-23 DIAGNOSIS — I4891 Unspecified atrial fibrillation: Secondary | ICD-10-CM | POA: Diagnosis not present

## 2023-04-23 DIAGNOSIS — M6281 Muscle weakness (generalized): Secondary | ICD-10-CM | POA: Diagnosis not present

## 2023-04-23 DIAGNOSIS — A419 Sepsis, unspecified organism: Secondary | ICD-10-CM | POA: Diagnosis not present

## 2023-04-23 LAB — GLUCOSE, CAPILLARY
Glucose-Capillary: 200 mg/dL — ABNORMAL HIGH (ref 70–99)
Glucose-Capillary: 231 mg/dL — ABNORMAL HIGH (ref 70–99)
Glucose-Capillary: 209 mg/dL — ABNORMAL HIGH (ref 70–99)

## 2023-04-23 LAB — CBC WITH DIFFERENTIAL/PLATELET
Abs Immature Granulocytes: 0.26 10*3/uL — ABNORMAL HIGH (ref 0.00–0.07)
Basophils Absolute: 0.1 10*3/uL (ref 0.0–0.1)
Basophils Relative: 1 %
Eosinophils Absolute: 0.3 10*3/uL (ref 0.0–0.5)
Eosinophils Relative: 4 %
HCT: 38.2 % — ABNORMAL LOW (ref 39.0–52.0)
Hemoglobin: 13.1 g/dL (ref 13.0–17.0)
Immature Granulocytes: 4 %
Lymphocytes Relative: 23 %
Lymphs Abs: 1.5 10*3/uL (ref 0.7–4.0)
MCH: 32.5 pg (ref 26.0–34.0)
MCHC: 34.3 g/dL (ref 30.0–36.0)
MCV: 94.8 fL (ref 80.0–100.0)
Monocytes Absolute: 0.7 10*3/uL (ref 0.1–1.0)
Monocytes Relative: 10 %
Neutro Abs: 4 10*3/uL (ref 1.7–7.7)
Neutrophils Relative %: 58 %
Platelets: 272 10*3/uL (ref 150–400)
RBC: 4.03 MIL/uL — ABNORMAL LOW (ref 4.22–5.81)
RDW: 14.6 % (ref 11.5–15.5)
WBC: 6.8 10*3/uL (ref 4.0–10.5)
nRBC: 0 % (ref 0.0–0.2)

## 2023-04-23 LAB — COMPREHENSIVE METABOLIC PANEL
ALT: 19 U/L (ref 0–44)
AST: 33 U/L (ref 15–41)
Albumin: 2.3 g/dL — ABNORMAL LOW (ref 3.5–5.0)
Alkaline Phosphatase: 43 U/L (ref 38–126)
Anion gap: 6 (ref 5–15)
BUN: 25 mg/dL — ABNORMAL HIGH (ref 8–23)
CO2: 29 mmol/L (ref 22–32)
Calcium: 11.3 mg/dL — ABNORMAL HIGH (ref 8.9–10.3)
Chloride: 95 mmol/L — ABNORMAL LOW (ref 98–111)
Creatinine, Ser: 1.36 mg/dL — ABNORMAL HIGH (ref 0.61–1.24)
GFR, Estimated: 57 mL/min — ABNORMAL LOW (ref >60)
Glucose, Bld: 214 mg/dL — ABNORMAL HIGH (ref 70–99)
Potassium: 3.6 mmol/L (ref 3.5–5.1)
Sodium: 130 mmol/L — ABNORMAL LOW (ref 135–145)
Total Bilirubin: 1.1 mg/dL (ref <1.2)
Total Protein: 5.3 g/dL — ABNORMAL LOW (ref 6.5–8.1)

## 2023-04-23 MED ORDER — FOLIC ACID 1 MG PO TABS: 1.0000 mg | ORAL_TABLET | Freq: Every day | ORAL | Status: DC

## 2023-04-23 MED ORDER — AMOXICILLIN-POT CLAVULANATE 875-125 MG PO TABS: 1.0000 | ORAL_TABLET | Freq: Two times a day (BID) | ORAL | Status: AC

## 2023-04-23 MED ORDER — ASPIRIN 81 MG PO TBEC: 81.0000 mg | DELAYED_RELEASE_TABLET | Freq: Every day | ORAL | Status: DC

## 2023-04-23 MED ORDER — INSULIN ASPART 100 UNIT/ML IJ SOLN: 0.0000 [IU] | Freq: Three times a day (TID) | INTRAMUSCULAR | Status: DC

## 2023-04-23 MED ORDER — TAMSULOSIN HCL 0.4 MG PO CAPS: 0.4000 mg | ORAL_CAPSULE | Freq: Every day | ORAL | Status: DC

## 2023-04-23 MED ORDER — FINASTERIDE 5 MG PO TABS: 5.0000 mg | ORAL_TABLET | Freq: Every day | ORAL | Status: DC

## 2023-04-23 MED ORDER — POLYETHYLENE GLYCOL 3350 17 G PO PACK: 17.0000 g | PACK | Freq: Every day | ORAL | Status: DC | PRN

## 2023-04-23 MED ORDER — APIXABAN 5 MG PO TABS: 5.0000 mg | ORAL_TABLET | Freq: Two times a day (BID) | ORAL | Status: DC

## 2023-04-23 MED ORDER — INSULIN GLARGINE-YFGN 100 UNIT/ML ~~LOC~~ SOLN: 15.0000 [IU] | Freq: Every day | SUBCUTANEOUS | Status: DC

## 2023-04-23 NOTE — Discharge Summary (Signed)
Physician Discharge Summary   Patient: Angel Shelton MRN: [id number] DOB: [birth year]  Admit date:     04/14/2023  Discharge date: 04/23/23  Discharge Physician: Debarah Crape   PCP: Cleatis Polka., MD   Recommendations at discharge:   Follow up with IR in 6 weeks for repeat imaging Follow up with PCP to discuss hospital stay and recent medication changes Follow up with general surgery to discuss timing of gallbladder removal.  Discharge Diagnoses: Principal Problem:   Acute cholecystitis - s/p cholecystostomy drain placement on 04-16-2023. Active Problems:   Acute urinary retention   Hypotension   Sepsis with acute organ dysfunction (HCC)   DM2 (diabetes mellitus, type 2) (HCC)   Dyslipidemia associated with type 2 diabetes mellitus (HCC)   Atrial fibrillation, permanent (HCC)   History of CVA (cerebrovascular accident)   Chronic hyponatremia - Baseline around 128   Thrombocytopenia (HCC)   Abnormal CT of the abdomen   Lactic acidosis   CKD stage 3a, GFR 45-59 ml/min (HCC) - baseline SCr 1.5   Hypomagnesemia  Resolved Problems:   * No resolved hospital problems. [facility] Course: Patient is a 67 year old male with hypertension, A-fib on Eliquis, CVA, type 2 diabetes, history of renal mass status post right partial nephrectomy, who presented with abdominal pain.  CT abdomen pelvis revealed calcified gallstone within the gallbladder lumen as well as an impacted gallstone in the gallbladder neck.  Findings were concerning for acute cholecystitis. General surgery was consulted.  Patient was started on antibiotics.  General surgery requested IR placed cholecystostomy tube which was done on 11/20.  Patient will need to follow-up with IR in 6 weeks for outpatient imaging. Cholecystectomy performed as an outpatient.  Culture from cholecystotomy tube grew Klebsiella that was sensitive to Unasyn.  Patient's antibiotics were changed to Augmentin, for which he will complete a  14-day course.  His stay was complicated by acute urinary retention.  He did undergo a trial of void which was unsuccessful and Foley catheter was reinserted on 11/24.  He will need to follow-up with urology outpatient in 4 weeks for office voiding trial. His blood pressure was very labile during his stay.  He was initially hypotensive due to sepsis and antihypertensives were held.  Given his paroxysmal A-fib, we gradually restarted his beta-blockers, however he was unable to tolerate calcium channel blockers.  By evaluation on 11/27 patient was nearing his baseline but has ongoing therapy needs.  He will be discharging directly to a skilled nursing facility for continued PT/OT before returning home.  On day of discharge his care plan was discussed with him and he endorses understanding.      Consultants: General Surgery, IR Procedures perform: Cholecystostomy tube  Disposition: Skilled nursing facility Diet recommendation:  Discharge Diet Orders (From admission, onward)     Start     Ordered   04/23/23 0000  Diet - low sodium heart healthy        04/23/23 1410           Cardiac and Carb modified diet DISCHARGE MEDICATION: Allergies as of 04/23/2023   No Known Allergies      Medication List     STOP taking these medications    apixaban or placebo 5 mg Tabs tablet Replaced by: apixaban 5 MG Tabs tablet   cyclobenzaprine 10 MG tablet Commonly known as: FLEXERIL   diltiazem 240 MG 24 hr capsule Commonly known as: CARDIZEM CD   sildenafil 100 MG tablet Commonly known as:  VIAGRA   Toujeo SoloStar 300 UNIT/ML Solostar Pen Generic drug: insulin glargine (1 Unit Dial)       TAKE these medications    amoxicillin-clavulanate 875-125 MG tablet Commonly known as: AUGMENTIN Take 1 tablet by mouth every 12 (twelve) hours for 11 days.   apixaban 5 MG Tabs tablet Commonly known as: ELIQUIS Take 1 tablet (5 mg total) by mouth 2 (two) times daily. Replaces: apixaban or  placebo 5 mg Tabs tablet   aspirin EC 81 MG tablet Take 1 tablet (81 mg total) by mouth daily. Swallow whole. Start taking on: April 24, 2023   Benefiber Drink Mix Pack Take 1 Package by mouth daily.   fenofibrate 160 MG tablet Take 160 mg by mouth daily.   finasteride 5 MG tablet Commonly known as: PROSCAR Take 1 tablet (5 mg total) by mouth daily. Start taking on: April 24, 2023   Fish Oil 1000 MG Caps Take 1,200 mg by mouth daily.   gabapentin 600 MG tablet Commonly known as: NEURONTIN Take 600 mg by mouth 3 (three) times daily. What changed: Another medication with the same name was removed. Continue taking this medication, and follow the directions you see here.   insulin aspart 100 UNIT/ML injection Commonly known as: novoLOG Inject 0-9 Units into the skin 3 (three) times daily with meals.   insulin glargine-yfgn 100 UNIT/ML injection Commonly known as: SEMGLEE Inject 0.15 mLs (15 Units total) into the skin daily. Start taking on: April 24, 2023   Jardiance 10 MG Tabs tablet Generic drug: empagliflozin Take 10 mg by mouth daily.   metFORMIN 1000 MG tablet Commonly known as: GLUCOPHAGE Take 1,000 mg by mouth 2 (two) times daily.   metoprolol succinate 100 MG 24 hr tablet Commonly known as: TOPROL-XL Take 100 mg by mouth daily.   pantoprazole 40 MG tablet Commonly known as: PROTONIX Take 1 tablet (40 mg total) by mouth daily before breakfast.   polyethylene glycol 17 g packet Commonly known as: MIRALAX / GLYCOLAX Take 17 g by mouth daily as needed for mild constipation.   PROBIOTIC DIGESTIVE SUPPORT PO Take 1 tablet by mouth daily.   rosuvastatin 40 MG tablet Commonly known as: CRESTOR Take 40 mg by mouth daily.   tamsulosin 0.4 MG Caps capsule Commonly known as: FLOMAX Take 1 capsule (0.4 mg total) by mouth daily after supper.   VITAMIN B-12 PO Take 1 tablet by mouth at bedtime.   Xdemvy 0.25 % Soln Generic drug: Lotilaner Place 1  drop into both eyes in the morning and at bedtime.               Discharge Care Instructions  (From admission, onward)           Start     Ordered   04/23/23 0000  Leave dressing on - Keep it clean, dry, and intact until clinic visit        04/23/23 1410            Contact information for follow-up providers     Simonne Come, MD Follow up in 8 week(s).   Specialties: Interventional Radiology, Radiology Why: you will hear from IR scheduler for OP appt time and date; flush drain once daily; record output; call [phone] if any questions Contact information: [address] [place] [address] [phone]         Emelia Loron, MD. Call.   Specialty: General Surgery Why: We are making a follow up appointment for you.,  Please call to confirm appointment time., Arrive 30 minutes early to complete check in, and bring photo ID and insurance card. you will need to have your drain study completed prior to this appointment Contact information: [address] [place] [address] [phone]              Contact information for after-discharge care     Destination     [facility] Preferred SNF .   Service: Skilled Nursing Contact information: [address] [place] [address] [phone]                    Discharge Exam: Ceasar Mons Weights   04/15/23 0122 04/16/23 0503  Weight: 77.2 kg 78.5 kg   Constitutional:  Normal appearance. Non toxic-appearing.  HENT: Head Normocephalic and atraumatic.  Mucous membranes are moist.  Eyes:  Extraocular intact. Conjunctivae normal. Pupils are equal, round, and reactive to light.  Cardiovascular: Rate and Rhythm: Normal rate and regular rhythm.  Pulmonary: Non labored, symmetric rise of chest wall.  Abdomen: Per chole draining dark green fluid, mild tenderness to palpation surrounding areas. Musculoskeletal:   Normal range of motion.  Skin: warm and dry. not jaundiced.  Neurological: No focal deficit present. Oriented. Psychiatric: Mood and Affect congruent.    Condition at discharge: good  The results of significant diagnostics from this hospitalization (including imaging, microbiology, ancillary and laboratory) are listed below for reference.   Imaging Studies: IR Perc Cholecystostomy  Result Date: 04/16/2023 INDICATION: Acute cholecystitis. Poor operative candidate. Presents for image guided cholecystostomy tube placement for infection source control purposes. EXAM: ULTRASOUND AND FLUOROSCOPIC-GUIDED CHOLECYSTOSTOMY TUBE PLACEMENT COMPARISON:  None Available. MEDICATIONS: The patient is currently admitted to the hospital and on intravenous antibiotics. Antibiotics were administered within an appropriate time frame prior to skin puncture. ANESTHESIA/SEDATION: Moderate (conscious) sedation was employed during this procedure. A total of Versed mg and Fentanyl mcg was administered intravenously. Moderate Sedation Time: minutes. The patient's level of consciousness and vital signs were monitored continuously by radiology nursing throughout the procedure under my direct supervision. CONTRAST:  15mL OMNIPAQUE IOHEXOL 300 MG/ML SOLN - administered into the gallbladder fossa. FLUOROSCOPY TIME:  minutes  seconds ( mGy) COMPLICATIONS: None immediate. PROCEDURE: Informed written consent was obtained from the patient after a discussion of the risks, benefits and alternatives to treatment. Questions regarding the procedure were encouraged and answered. A timeout was performed prior to the initiation of the procedure. The right upper abdominal quadrant was prepped and draped in the usual sterile fashion, and a sterile drape was applied covering the operative field. Maximum barrier sterile technique with sterile gowns and gloves were used for the procedure. A timeout was performed prior to the initiation of the  procedure. Local anesthesia was provided with 1% lidocaine with epinephrine. Ultrasound scanning of the right upper quadrant demonstrates a markedly dilated gallbladder. Of note, the patient reported pain with ultrasound imaging over the gallbladder. Utilizing a transhepatic approach, a 22 gauge needle was advanced into the gallbladder under direct ultrasound guidance. An ultrasound image was saved for documentation purposes. Appropriate intraluminal puncture was confirmed with the efflux of bile and advancement of an 0.018 wire into the gallbladder lumen. The needle was exchanged for an Accustick set. A small amount of contrast was injected to confirm appropriate intraluminal positioning. Over a Benson wire, a 10.2-French Cook cholecystomy tube was advanced into the gallbladder fossa, coiled and locked. A small amount foul-smelling bile was aspirated, capped  and sent to the laboratory for analysis. A small amount of contrast was injected as several post procedural spot radiographic images were obtained in various obliquities. The catheter was secured to the skin with suture, connected to a drainage bag and a dressing was applied. The patient tolerated the procedure well without immediate post procedural complication. IMPRESSION: Successful ultrasound and fluoroscopic guided placement of a 10.2 French cholecystostomy tube. Electronically Signed   By: Simonne Come M.D.   On: 04/16/2023 13:55   US Abdomen Limited RUQ (LIVER/GB)  Result Date: 04/14/2023 CLINICAL DATA:  Abdominal pain EXAM: ULTRASOUND ABDOMEN LIMITED RIGHT UPPER QUADRANT COMPARISON:  CT today. FINDINGS: Gallbladder: There is marked gallbladder wall thickening measuring up to 9 mm with pericholecystic fluid and tenderness over the gallbladder during the study compatible with acute cholecystitis. Sludge and stones within the gallbladder. Common bile duct: Diameter: Normal caliber, 5 mm Liver: No focal lesion identified. Within normal limits in  parenchymal echogenicity. Portal vein is patent on color Doppler imaging with normal direction of blood flow towards the liver. Other: None. IMPRESSION: Cholelithiasis with evidence of acute cholecystitis. Electronically Signed   By: Charlett Nose M.D.   On: 04/14/2023 22:19   CT ABDOMEN PELVIS W CONTRAST  Result Date: 04/14/2023 CLINICAL DATA:  LLQ abdominal pain abdominal pain, ?dtic. History of renal cell carcinoma. EXAM: CT ABDOMEN AND PELVIS WITH CONTRAST TECHNIQUE: Multidetector CT imaging of the abdomen and pelvis was performed using the standard protocol following bolus administration of intravenous contrast. RADIATION DOSE REDUCTION: This exam was performed according to the departmental dose-optimization program which includes automated exposure control, adjustment of the mA and/or kV according to patient size and/or use of iterative reconstruction technique. CONTRAST:  60mL OMNIPAQUE IOHEXOL 350 MG/ML SOLN COMPARISON:  CT abdomen pelvis 09/30/2004, ultrasound abdomen 10/02/2021, MRI abdomen 05/22/2020, CT abdomen 03/21/2021 FINDINGS: Lower chest: Tiny hiatal hernia.  No acute abnormality. Hepatobiliary: No focal liver abnormality. Calcified gallstone noted within the gallbladder lumen. Associated marked gallbladder wall thickening or pericholecystic fluid. A 1.6 cm gallstone likely impacted within the gallbladder neck. No biliary dilatation. Pancreas: No focal lesion. Normal pancreatic contour. No surrounding inflammatory changes. No main pancreatic ductal dilatation. Spleen: Normal in size without focal abnormality. Adrenals/Urinary Tract: No adrenal nodule bilaterally. Bilateral kidneys enhance symmetrically.  Partial right nephrectomy. No hydronephrosis. No hydroureter. Left nephrolithiasis measuring to 7 mm. Punctate right nephrolithiasis. No ureterolithiasis bilaterally. Stable in size fluid density lesions within the kidneys likely represent simple renal cysts. Simple renal cysts, in the absence  of clinically indicated signs/symptoms, require no independent follow-up. The urinary bladder is distended with urine with mild trabeculations noted. On delayed imaging, there is no urothelial wall thickening and there are no filling defects in the opacified portions of the bilateral collecting systems or ureters. Stomach/Bowel: Incidentally noted possible thinning of the gastric fundal wall (3:6). The stomach is otherwise within normal limits. No evidence of small bowel wall thickening or dilatation. Colonic diverticulosis. Slightly irregular bowel wall thickening of the hepatic flexure along the a focal diverticula (8:53). Mild bowel wall thickening of the ascending colon. Right appendix appears normal. Vascular/Lymphatic: No abdominal aorta or iliac aneurysm. Severe atherosclerotic plaque of the aorta and its branches. No abdominal, pelvic, or inguinal lymphadenopathy. Reproductive: Prostate is prominent in size. Other: Right upper quadrant mesenteric fat stranding and trace free fluid. No intraperitoneal free gas. No organized fluid collection. Musculoskeletal: No abdominal wall hernia or abnormality. No suspicious lytic or blastic osseous lesions. No acute displaced fracture. Multilevel degenerative changes  of the spine. Intervertebral disc space vacuum phenomenon at the L3-L4 and L4-L5 levels. IMPRESSION: 1. Cholelithiasis with acute cholecystitis. Associated 1.6 cm gallstone likely impacted within the gallbladder neck. No CT evidence of choledocholithiasis. Recommend surgical consultation. 2. Bowel thickening of the hepatic flexure and mild bowel wall thickening of the ascending colon in the setting of colonic diverticulosis. Finding likely reactive in etiology. Given slight focal irregular bowel thickening along the colonic diverticula, differential diagnosis includes much less likely concurrent acute diverticulitis. Consider colonoscopy status post treatment and status post complete resolution of  inflammatory changes to exclude an underlying lesion. 3. Incidentally noted possible thinning of the gastric fundal wall. Limited evaluation due to under distension of the stomach. Recommend correlation with signs and symptoms of gastric ulceration. Consider outpatient endoscopy for direct visualization if clinically indicated. 4. Nonobstructive bilateral nephrolithiasis measuring up to 7 mm on the left and punctate on the right. 5. Status post partial right nephrectomy. 6. Prostate is prominent in size with associated urinary bladder distension with urine and mild urinary bladder wall trabeculations. Correlate with signs and symptoms of obstructive uropathy. 7.  Aortic Atherosclerosis (ICD10-I70.0)-severe. Electronically Signed   By: Tish Frederickson M.D.   On: 04/14/2023 20:12   DG Chest Portable 1 View  Result Date: 04/14/2023 CLINICAL DATA:  Fall. EXAM: PORTABLE CHEST 1 VIEW COMPARISON:  Chest radiograph dated March 18, 2022. CT chest dated May 28, 2022. FINDINGS: Patient is rotated to the right. The heart size and mediastinal contours are otherwise within normal limits. Aortic atherosclerosis. Coarse interstitial markings. No focal consolidation, sizeable pleural effusion, or pneumothorax. Remote bilateral healed rib fractures. No acute osseous abnormality. IMPRESSION: 1. No acute findings in the chest. 2. Emphysema. Electronically Signed   By: Hart Robinsons M.D.   On: 04/14/2023 17:18    Microbiology: Results for orders placed or performed during the hospital encounter of 04/14/23  Aerobic/Anaerobic Culture w Gram Stain (surgical/deep wound)     Status: None   Collection Time: 04/16/23 12:23 PM   Specimen: Abscess  Result Value Ref Range Status   Specimen Description ABSCESS  Final   Special Requests NONE  Final   Gram Stain   Final    RARE WBC PRESENT,BOTH PMN AND MONONUCLEAR MODERATE GRAM NEGATIVE RODS RARE GRAM POSITIVE RODS RARE BUDDING YEAST SEEN    Culture   Final    ABUNDANT  KLEBSIELLA PNEUMONIAE ABUNDANT ESCHERICHIA COLI PHOCAEICOLA VULGATUS BETA LACTAMASE POSITIVE FAILED TO GROW FOR SUSCEPTIBILITY TESTING ESCHERICHIA COLI CALL MICROBIOLOGY LAB IF SENSITIVITIES ARE REQUIRED. Performed at [facility], [address], [place], [place] [address]    Report Status 04/21/2023 FINAL  Final   Organism ID, Bacteria KLEBSIELLA PNEUMONIAE  Final      Susceptibility   Klebsiella pneumoniae - MIC*    AMPICILLIN RESISTANT Resistant     CEFEPIME <=0.12 SENSITIVE Sensitive     CEFTAZIDIME <=1 SENSITIVE Sensitive     CEFTRIAXONE <=0.25 SENSITIVE Sensitive     CIPROFLOXACIN <=0.25 SENSITIVE Sensitive     GENTAMICIN <=1 SENSITIVE Sensitive     IMIPENEM <=0.25 SENSITIVE Sensitive     TRIMETH/SULFA <=20 SENSITIVE Sensitive     AMPICILLIN/SULBACTAM <=2 SENSITIVE Sensitive     PIP/TAZO <=4 SENSITIVE Sensitive ug/mL    * ABUNDANT KLEBSIELLA PNEUMONIAE    Labs: CBC: Recent Labs  Lab 04/17/23 0554 04/20/23 1011 04/23/23 0859  WBC 8.7 8.1 6.8  NEUTROABS  --  5.6 4.0  HGB 11.5* 13.2 13.1  HCT 32.6* 38.6* 38.2*  MCV 89.6 91.5 94.8  PLT 113* 252 272   Basic Metabolic Panel: Recent Labs  Lab 04/17/23 0554 04/18/23 0623 04/20/23 1011 04/23/23 1057  NA 124* 127* 124* 130*  K 3.7 4.3 4.0 3.6  CL 95* 97* 90* 95*  CO2 23 23 24 29   GLUCOSE 111* 107* 312* 214*  BUN 22 26* 27* 25*  CREATININE 1.55* 1.56* 1.51* 1.36*  CALCIUM 9.7 10.4* 11.1* 11.3*  MG 1.6* 1.9  --   --   PHOS 2.3*  --   --   --    Liver Function Tests: Recent Labs  Lab 04/17/23 0554 04/18/23 0623 04/23/23 1057  AST 29 38 33  ALT 20 20 19   ALKPHOS 46 46 43  BILITOT 0.9 1.3* 1.1  PROT 4.5* 4.8* 5.3*  ALBUMIN 1.9* 2.0* 2.3*   CBG: Recent Labs  Lab 04/22/23 1138 04/22/23 1600 04/22/23 1930 04/23/23 0738 04/23/23 1311  GLUCAP 285* 212* 246* 200* 231*    Discharge time spent: greater than 30 minutes.  Signed: Debarah Crape, DO [facility] 04/23/2023

## 2023-04-23 NOTE — Inpatient Diabetes Management (Signed)
Inpatient Diabetes Program Recommendations  AACE/ADA: New Consensus Statement on Inpatient Glycemic Control (2015)  Target Ranges:  Prepandial:   less than 140 mg/dL      Peak postprandial:   less than 180 mg/dL (1-2 hours)      Critically ill patients:  140 - 180 mg/dL   Lab Results  Component Value Date   GLUCAP 200 (H) 04/23/2023   HGBA1C 7.4 (H) 04/15/2023    Review of Glycemic Control  Latest Reference Range & Units 04/22/23 08:11 04/22/23 11:38 04/22/23 16:00 04/22/23 19:30 04/23/23 07:38  Glucose-Capillary 70 - 99 mg/dL 161 (H) 096 (H) 045 (H) 246 (H) 200 (H)   Diabetes history: DM 2 Outpatient Diabetes medications: Jardiance 10 mg Daily, metformin 1000 mg bid, Toujeo 10 units Daily Current orders for Inpatient glycemic control:  Semglee 15 units Daily Novolog 0-9 units tid  Ensure Enlive bid between meals (40 grams of carbohydrates)  Inpatient Diabetes Program Recommendations:    Glucose trends increase after PO/supplement intake  -   Pt may benefit from Novolog 3 units tid meal coverage if eating >50% of meals/supplements  Thanks,  Christena Deem RN, MSN, BC-ADM Inpatient Diabetes Coordinator Team Pager [phone] (8a-5p)

## 2023-04-23 NOTE — Progress Notes (Signed)
Physical Therapy Treatment Patient Details Name: Angel Shelton MRN: [id number] DOB: 1955/08/18 Today's Date: 04/23/2023   History of Present Illness Angel Shelton is a 67 y.o. malewho presents with abdominal pain.  Cholecystitis and received chole drain.PMH:  HTN, atrial fibrillation on Eliquis, CVA, type 2 diabetes, history of renal mass s/p right partial nephrectomy    PT Comments  Pt received in supine and agreeable to session. Pt relies on momentum for bed mobility despite cues for use of BUE. Pt reports lightheadedness during first standing trial, however reports improvement with seated rest break. Pt able to progress gait distance this session with CGA for safety. Pt demonstrates stiff and slightly unsteady gait, but no LOB. Wound noted on pt's back at end of session, RN notified.  Pt continues to benefit from PT services to progress toward functional mobility goals.    If plan is discharge home, recommend the following: A lot of help with walking and/or transfers;A little help with bathing/dressing/bathroom;Assistance with cooking/housework;Assist for transportation;Help with stairs or ramp for entrance   Can travel by private vehicle     No  Equipment Recommendations  Rolling walker (2 wheels);BSC/3in1    Recommendations for Other Services       Precautions / Restrictions Precautions Precautions: Fall Restrictions Weight Bearing Restrictions: No     Mobility  Bed Mobility Overal bed mobility: Needs Assistance Bed Mobility: Supine to Sit, Sit to Supine     Supine to sit: HOB elevated, Used rails, Contact guard Sit to supine: Contact guard assist   General bed mobility comments: increased time and use of momentum for trunk elevation    Transfers Overall transfer level: Needs assistance Equipment used: Rolling walker (2 wheels) Transfers: Sit to/from Stand Sit to Stand: Contact guard assist           General transfer comment: from EOB with cues for  hand placement    Ambulation/Gait Ambulation/Gait assistance: Contact guard assist Gait Distance (Feet): 100 Feet Assistive device: Rolling walker (2 wheels) Gait Pattern/deviations: Step-through pattern, Trunk flexed, Decreased stride length       General Gait Details: Cues for upright posture and RW proximity with little improvement. increased unsteadiness with turns, but no overt LOB      Balance Overall balance assessment: Needs assistance Sitting-balance support: No upper extremity supported Sitting balance-Leahy Scale: Good Sitting balance - Comments: sitting EOB   Standing balance support: Bilateral upper extremity supported, During functional activity, Reliant on assistive device for balance Standing balance-Leahy Scale: Fair Standing balance comment: with RW support                            Cognition Arousal: Alert Behavior During Therapy: WFL for tasks assessed/performed Overall Cognitive Status: Within Functional Limits for tasks assessed                                          Exercises      General Comments General comments (skin integrity, edema, etc.): Initial lightheadedness during first stand that improved with seated rest break. open wound noted on R side of back, RN notified      Pertinent Vitals/Pain Pain Assessment Pain Assessment: No/denies pain     PT Goals (current goals can now be found in the care plan section) Acute Rehab PT Goals Patient Stated Goal: to get better PT Goal Formulation:  With patient Time For Goal Achievement: 05/01/23 Progress towards PT goals: Progressing toward goals    Frequency    Min 1X/week       AM-PAC PT "6 Clicks" Mobility   Outcome Measure  Help needed turning from your back to your side while in a flat bed without using bedrails?: A Little Help needed moving from lying on your back to sitting on the side of a flat bed without using bedrails?: A Little Help needed  moving to and from a bed to a chair (including a wheelchair)?: A Little Help needed standing up from a chair using your arms (e.g., wheelchair or bedside chair)?: A Little Help needed to walk in hospital room?: A Little Help needed climbing 3-5 steps with a railing? : Total 6 Click Score: 16    End of Session Equipment Utilized During Treatment: Gait belt Activity Tolerance: Patient tolerated treatment well Patient left: in bed;with call bell/phone within reach Nurse Communication: Mobility status PT Visit Diagnosis: Unsteadiness on feet (R26.81)     Time: 1456-1510 PT Time Calculation (min) (ACUTE ONLY): 14 min  Charges:    $Gait Training: 8-22 mins PT General Charges $$ ACUTE PT VISIT: 1 Visit                     Johny Shock, PTA Acute Rehabilitation Services Secure Chat Preferred  [phone]    Johny Shock 04/23/2023, 3:25 PM

## 2023-04-23 NOTE — Progress Notes (Signed)
Mobility Specialist Progress Note:    04/23/23 1150  Orthostatic Lying   BP- Lying 114/68  Pulse- Lying 78  Orthostatic Sitting  BP- Sitting 115/58 (post mobility)  Pulse- Sitting 86  Mobility  Activity Ambulated with assistance in room;Dangled on edge of bed;Ambulated with assistance in hallway  Level of Assistance Contact guard assist, steadying assist  Assistive Device Front wheel walker  Distance Ambulated (ft) 60 ft  Activity Response Tolerated well  Mobility Referral Yes  $Mobility charge 1 Mobility  Mobility Specialist Start Time (ACUTE ONLY) 1140  Mobility Specialist Stop Time (ACUTE ONLY) 1150  Mobility Specialist Time Calculation (min) (ACUTE ONLY) 10 min   Pt received supine in bed, agreeable to mobility session. Took BP d/t orthostatic vitals in previous session. Ambulated in hallway with RW and CGA for safety. Tolerated well, asx throughout. Returned pt to room, sitting at EOB for lunch. Left with all needs met, call bell in reach.   [name] Mobility Specialist Please contact via Special educational needs teacher or  Rehab office at [phone]

## 2023-04-23 NOTE — TOC Transition Note (Addendum)
Transition of Care [facility]) - CM/SW Discharge Note   Patient Details  Name: Angel Shelton MRN: [id number] Date of Birth: 08/27/1955  [facility] CM/SW Contact:  Cathyrn Deas A [place], LCSWA Phone Number: 04/23/2023, 11:38 AM   Clinical Narrative:     Patient will DC to: [facility]  Anticipated DC date: 04/23/23  Family notified: Royetta Car  Transport by: Sharin Mons      Per MD patient ready for DC to [facility]. RN, patient, patient's family, and facility notified of DC. Discharge Summary and FL2 sent to facility. RN to call report prior to discharge (Room 604p, [phone]). DC packet on chart. Ambulance transport requested for patient.     CSW will sign off for now as social work intervention is no longer needed. Please consult [place] again if new needs arise.   Final next level of care: Home w Home Health Services Barriers to Discharge: Barriers Resolved   Patient Goals and CMS Choice      Discharge Placement                Patient chooses bed at: [facility] Patient to be transferred to facility by: [facility] Name of family member notified: Royetta Car Patient and family notified of of transfer: 04/23/23  Discharge Plan and Services Additional resources added to the After Visit Summary for                                       Social Determinants of Health (SDOH) Interventions SDOH Screenings   Food Insecurity: No Food Insecurity (04/15/2023)  Housing: Low Risk  (04/15/2023)  Transportation Needs: No Transportation Needs (04/15/2023)  Utilities: Not At Risk (04/15/2023)  Tobacco Use: Medium Risk (04/16/2023)     Readmission Risk Interventions    10/04/2021   12:20 PM  Readmission Risk Prevention Plan  Transportation Screening Complete  PCP or Specialist Appt within 5-7 Days Complete  Home Care Screening Complete  Medication Review (RN CM) Complete

## 2023-04-23 NOTE — Progress Notes (Signed)
Report given to RN [place]

## 2023-04-28 ENCOUNTER — Other Ambulatory Visit: Payer: Self-pay | Admitting: General Surgery

## 2023-04-28 DIAGNOSIS — R338 Other retention of urine: Secondary | ICD-10-CM | POA: Diagnosis not present

## 2023-04-28 DIAGNOSIS — R109 Unspecified abdominal pain: Secondary | ICD-10-CM

## 2023-04-28 DIAGNOSIS — A4150 Gram-negative sepsis, unspecified: Secondary | ICD-10-CM | POA: Diagnosis not present

## 2023-04-28 DIAGNOSIS — I4891 Unspecified atrial fibrillation: Secondary | ICD-10-CM | POA: Diagnosis not present

## 2023-04-28 DIAGNOSIS — K8000 Calculus of gallbladder with acute cholecystitis without obstruction: Secondary | ICD-10-CM | POA: Diagnosis not present

## 2023-04-29 DIAGNOSIS — M6281 Muscle weakness (generalized): Secondary | ICD-10-CM | POA: Diagnosis not present

## 2023-04-29 DIAGNOSIS — R2689 Other abnormalities of gait and mobility: Secondary | ICD-10-CM | POA: Diagnosis not present

## 2023-04-29 DIAGNOSIS — K915 Postcholecystectomy syndrome: Secondary | ICD-10-CM | POA: Diagnosis not present

## 2023-04-29 DIAGNOSIS — I4891 Unspecified atrial fibrillation: Secondary | ICD-10-CM | POA: Diagnosis not present

## 2023-04-30 DIAGNOSIS — I4891 Unspecified atrial fibrillation: Secondary | ICD-10-CM | POA: Diagnosis not present

## 2023-04-30 DIAGNOSIS — A4150 Gram-negative sepsis, unspecified: Secondary | ICD-10-CM | POA: Diagnosis not present

## 2023-04-30 DIAGNOSIS — K8000 Calculus of gallbladder with acute cholecystitis without obstruction: Secondary | ICD-10-CM | POA: Diagnosis not present

## 2023-04-30 DIAGNOSIS — R338 Other retention of urine: Secondary | ICD-10-CM | POA: Diagnosis not present

## 2023-05-01 DIAGNOSIS — I4891 Unspecified atrial fibrillation: Secondary | ICD-10-CM | POA: Diagnosis not present

## 2023-05-01 DIAGNOSIS — A4150 Gram-negative sepsis, unspecified: Secondary | ICD-10-CM | POA: Diagnosis not present

## 2023-05-01 DIAGNOSIS — K8000 Calculus of gallbladder with acute cholecystitis without obstruction: Secondary | ICD-10-CM | POA: Diagnosis not present

## 2023-05-01 DIAGNOSIS — E1122 Type 2 diabetes mellitus with diabetic chronic kidney disease: Secondary | ICD-10-CM | POA: Diagnosis not present

## 2023-05-02 DIAGNOSIS — K8000 Calculus of gallbladder with acute cholecystitis without obstruction: Secondary | ICD-10-CM | POA: Diagnosis not present

## 2023-05-02 DIAGNOSIS — K915 Postcholecystectomy syndrome: Secondary | ICD-10-CM | POA: Diagnosis not present

## 2023-05-02 DIAGNOSIS — I4891 Unspecified atrial fibrillation: Secondary | ICD-10-CM | POA: Diagnosis not present

## 2023-05-02 DIAGNOSIS — M6281 Muscle weakness (generalized): Secondary | ICD-10-CM | POA: Diagnosis not present

## 2023-05-02 DIAGNOSIS — R338 Other retention of urine: Secondary | ICD-10-CM | POA: Diagnosis not present

## 2023-05-02 DIAGNOSIS — A4150 Gram-negative sepsis, unspecified: Secondary | ICD-10-CM | POA: Diagnosis not present

## 2023-05-02 DIAGNOSIS — R2689 Other abnormalities of gait and mobility: Secondary | ICD-10-CM | POA: Diagnosis not present

## 2023-05-05 DIAGNOSIS — K8000 Calculus of gallbladder with acute cholecystitis without obstruction: Secondary | ICD-10-CM | POA: Diagnosis not present

## 2023-05-05 DIAGNOSIS — A4150 Gram-negative sepsis, unspecified: Secondary | ICD-10-CM | POA: Diagnosis not present

## 2023-05-05 DIAGNOSIS — R338 Other retention of urine: Secondary | ICD-10-CM | POA: Diagnosis not present

## 2023-05-05 DIAGNOSIS — I4891 Unspecified atrial fibrillation: Secondary | ICD-10-CM | POA: Diagnosis not present

## 2023-05-06 DIAGNOSIS — M6281 Muscle weakness (generalized): Secondary | ICD-10-CM | POA: Diagnosis not present

## 2023-05-06 DIAGNOSIS — K915 Postcholecystectomy syndrome: Secondary | ICD-10-CM | POA: Diagnosis not present

## 2023-05-06 DIAGNOSIS — R2689 Other abnormalities of gait and mobility: Secondary | ICD-10-CM | POA: Diagnosis not present

## 2023-05-06 DIAGNOSIS — I4891 Unspecified atrial fibrillation: Secondary | ICD-10-CM | POA: Diagnosis not present

## 2023-05-07 DIAGNOSIS — A4150 Gram-negative sepsis, unspecified: Secondary | ICD-10-CM | POA: Diagnosis not present

## 2023-05-07 DIAGNOSIS — K8000 Calculus of gallbladder with acute cholecystitis without obstruction: Secondary | ICD-10-CM | POA: Diagnosis not present

## 2023-05-07 DIAGNOSIS — I4891 Unspecified atrial fibrillation: Secondary | ICD-10-CM | POA: Diagnosis not present

## 2023-05-07 DIAGNOSIS — R338 Other retention of urine: Secondary | ICD-10-CM | POA: Diagnosis not present

## 2023-05-09 DIAGNOSIS — I4891 Unspecified atrial fibrillation: Secondary | ICD-10-CM | POA: Diagnosis not present

## 2023-05-09 DIAGNOSIS — R2689 Other abnormalities of gait and mobility: Secondary | ICD-10-CM | POA: Diagnosis not present

## 2023-05-09 DIAGNOSIS — K8000 Calculus of gallbladder with acute cholecystitis without obstruction: Secondary | ICD-10-CM | POA: Diagnosis not present

## 2023-05-09 DIAGNOSIS — K915 Postcholecystectomy syndrome: Secondary | ICD-10-CM | POA: Diagnosis not present

## 2023-05-09 DIAGNOSIS — K810 Acute cholecystitis: Secondary | ICD-10-CM | POA: Diagnosis not present

## 2023-05-09 DIAGNOSIS — R338 Other retention of urine: Secondary | ICD-10-CM | POA: Diagnosis not present

## 2023-05-09 DIAGNOSIS — A4150 Gram-negative sepsis, unspecified: Secondary | ICD-10-CM | POA: Diagnosis not present

## 2023-05-09 DIAGNOSIS — M6281 Muscle weakness (generalized): Secondary | ICD-10-CM | POA: Diagnosis not present

## 2023-05-09 NOTE — Progress Notes (Signed)
-   Acute kidney injury with acute tubular necrosis

## 2023-05-10 DIAGNOSIS — R799 Abnormal finding of blood chemistry, unspecified: Secondary | ICD-10-CM | POA: Diagnosis not present

## 2023-05-12 DIAGNOSIS — A4150 Gram-negative sepsis, unspecified: Secondary | ICD-10-CM | POA: Diagnosis not present

## 2023-05-12 DIAGNOSIS — K8000 Calculus of gallbladder with acute cholecystitis without obstruction: Secondary | ICD-10-CM | POA: Diagnosis not present

## 2023-05-12 DIAGNOSIS — I4891 Unspecified atrial fibrillation: Secondary | ICD-10-CM | POA: Diagnosis not present

## 2023-05-12 DIAGNOSIS — R338 Other retention of urine: Secondary | ICD-10-CM | POA: Diagnosis not present

## 2023-05-13 DIAGNOSIS — E785 Hyperlipidemia, unspecified: Secondary | ICD-10-CM | POA: Diagnosis not present

## 2023-05-13 DIAGNOSIS — K810 Acute cholecystitis: Secondary | ICD-10-CM | POA: Diagnosis not present

## 2023-05-13 DIAGNOSIS — I129 Hypertensive chronic kidney disease with stage 1 through stage 4 chronic kidney disease, or unspecified chronic kidney disease: Secondary | ICD-10-CM | POA: Diagnosis not present

## 2023-05-13 DIAGNOSIS — N1831 Chronic kidney disease, stage 3a: Secondary | ICD-10-CM | POA: Diagnosis not present

## 2023-05-14 DIAGNOSIS — N138 Other obstructive and reflux uropathy: Secondary | ICD-10-CM | POA: Diagnosis not present

## 2023-05-17 DIAGNOSIS — R339 Retention of urine, unspecified: Secondary | ICD-10-CM | POA: Diagnosis not present

## 2023-05-17 DIAGNOSIS — A4150 Gram-negative sepsis, unspecified: Secondary | ICD-10-CM | POA: Diagnosis not present

## 2023-05-17 DIAGNOSIS — N2889 Other specified disorders of kidney and ureter: Secondary | ICD-10-CM | POA: Diagnosis not present

## 2023-05-17 DIAGNOSIS — N1831 Chronic kidney disease, stage 3a: Secondary | ICD-10-CM | POA: Diagnosis not present

## 2023-05-17 DIAGNOSIS — Z7984 Long term (current) use of oral hypoglycemic drugs: Secondary | ICD-10-CM | POA: Diagnosis not present

## 2023-05-17 DIAGNOSIS — E1122 Type 2 diabetes mellitus with diabetic chronic kidney disease: Secondary | ICD-10-CM | POA: Diagnosis not present

## 2023-05-17 DIAGNOSIS — E785 Hyperlipidemia, unspecified: Secondary | ICD-10-CM | POA: Diagnosis not present

## 2023-05-17 DIAGNOSIS — Z9181 History of falling: Secondary | ICD-10-CM | POA: Diagnosis not present

## 2023-05-17 DIAGNOSIS — E1169 Type 2 diabetes mellitus with other specified complication: Secondary | ICD-10-CM | POA: Diagnosis not present

## 2023-05-17 DIAGNOSIS — I129 Hypertensive chronic kidney disease with stage 1 through stage 4 chronic kidney disease, or unspecified chronic kidney disease: Secondary | ICD-10-CM | POA: Diagnosis not present

## 2023-05-17 DIAGNOSIS — I959 Hypotension, unspecified: Secondary | ICD-10-CM | POA: Diagnosis not present

## 2023-05-17 DIAGNOSIS — Z7982 Long term (current) use of aspirin: Secondary | ICD-10-CM | POA: Diagnosis not present

## 2023-05-17 DIAGNOSIS — D696 Thrombocytopenia, unspecified: Secondary | ICD-10-CM | POA: Diagnosis not present

## 2023-05-17 DIAGNOSIS — Z8673 Personal history of transient ischemic attack (TIA), and cerebral infarction without residual deficits: Secondary | ICD-10-CM | POA: Diagnosis not present

## 2023-05-17 DIAGNOSIS — E871 Hypo-osmolality and hyponatremia: Secondary | ICD-10-CM | POA: Diagnosis not present

## 2023-05-17 DIAGNOSIS — K8000 Calculus of gallbladder with acute cholecystitis without obstruction: Secondary | ICD-10-CM | POA: Diagnosis not present

## 2023-05-17 DIAGNOSIS — Z7901 Long term (current) use of anticoagulants: Secondary | ICD-10-CM | POA: Diagnosis not present

## 2023-05-17 DIAGNOSIS — Z905 Acquired absence of kidney: Secondary | ICD-10-CM | POA: Diagnosis not present

## 2023-05-17 DIAGNOSIS — I480 Paroxysmal atrial fibrillation: Secondary | ICD-10-CM | POA: Diagnosis not present

## 2023-05-17 DIAGNOSIS — Z794 Long term (current) use of insulin: Secondary | ICD-10-CM | POA: Diagnosis not present

## 2023-05-19 DIAGNOSIS — R338 Other retention of urine: Secondary | ICD-10-CM | POA: Diagnosis not present

## 2023-05-19 DIAGNOSIS — C641 Malignant neoplasm of right kidney, except renal pelvis: Secondary | ICD-10-CM | POA: Diagnosis not present

## 2023-05-22 DIAGNOSIS — A4150 Gram-negative sepsis, unspecified: Secondary | ICD-10-CM | POA: Diagnosis not present

## 2023-05-22 DIAGNOSIS — K8000 Calculus of gallbladder with acute cholecystitis without obstruction: Secondary | ICD-10-CM | POA: Diagnosis not present

## 2023-05-22 DIAGNOSIS — Z7901 Long term (current) use of anticoagulants: Secondary | ICD-10-CM | POA: Diagnosis not present

## 2023-05-22 DIAGNOSIS — Z7982 Long term (current) use of aspirin: Secondary | ICD-10-CM | POA: Diagnosis not present

## 2023-05-22 DIAGNOSIS — E1169 Type 2 diabetes mellitus with other specified complication: Secondary | ICD-10-CM | POA: Diagnosis not present

## 2023-05-22 DIAGNOSIS — I129 Hypertensive chronic kidney disease with stage 1 through stage 4 chronic kidney disease, or unspecified chronic kidney disease: Secondary | ICD-10-CM | POA: Diagnosis not present

## 2023-05-22 DIAGNOSIS — I480 Paroxysmal atrial fibrillation: Secondary | ICD-10-CM | POA: Diagnosis not present

## 2023-05-22 DIAGNOSIS — E871 Hypo-osmolality and hyponatremia: Secondary | ICD-10-CM | POA: Diagnosis not present

## 2023-05-22 DIAGNOSIS — N2889 Other specified disorders of kidney and ureter: Secondary | ICD-10-CM | POA: Diagnosis not present

## 2023-05-22 DIAGNOSIS — N1831 Chronic kidney disease, stage 3a: Secondary | ICD-10-CM | POA: Diagnosis not present

## 2023-05-22 DIAGNOSIS — E1122 Type 2 diabetes mellitus with diabetic chronic kidney disease: Secondary | ICD-10-CM | POA: Diagnosis not present

## 2023-05-22 DIAGNOSIS — R339 Retention of urine, unspecified: Secondary | ICD-10-CM | POA: Diagnosis not present

## 2023-05-22 DIAGNOSIS — E785 Hyperlipidemia, unspecified: Secondary | ICD-10-CM | POA: Diagnosis not present

## 2023-05-22 DIAGNOSIS — I959 Hypotension, unspecified: Secondary | ICD-10-CM | POA: Diagnosis not present

## 2023-05-22 DIAGNOSIS — D696 Thrombocytopenia, unspecified: Secondary | ICD-10-CM | POA: Diagnosis not present

## 2023-05-25 DIAGNOSIS — E871 Hypo-osmolality and hyponatremia: Secondary | ICD-10-CM | POA: Diagnosis not present

## 2023-05-25 DIAGNOSIS — Z7901 Long term (current) use of anticoagulants: Secondary | ICD-10-CM | POA: Diagnosis not present

## 2023-05-25 DIAGNOSIS — I480 Paroxysmal atrial fibrillation: Secondary | ICD-10-CM | POA: Diagnosis not present

## 2023-05-25 DIAGNOSIS — K8000 Calculus of gallbladder with acute cholecystitis without obstruction: Secondary | ICD-10-CM | POA: Diagnosis not present

## 2023-05-25 DIAGNOSIS — A4150 Gram-negative sepsis, unspecified: Secondary | ICD-10-CM | POA: Diagnosis not present

## 2023-05-25 DIAGNOSIS — I129 Hypertensive chronic kidney disease with stage 1 through stage 4 chronic kidney disease, or unspecified chronic kidney disease: Secondary | ICD-10-CM | POA: Diagnosis not present

## 2023-05-25 DIAGNOSIS — N1831 Chronic kidney disease, stage 3a: Secondary | ICD-10-CM | POA: Diagnosis not present

## 2023-05-25 DIAGNOSIS — D696 Thrombocytopenia, unspecified: Secondary | ICD-10-CM | POA: Diagnosis not present

## 2023-05-25 DIAGNOSIS — N2889 Other specified disorders of kidney and ureter: Secondary | ICD-10-CM | POA: Diagnosis not present

## 2023-05-25 DIAGNOSIS — R339 Retention of urine, unspecified: Secondary | ICD-10-CM | POA: Diagnosis not present

## 2023-05-25 DIAGNOSIS — I959 Hypotension, unspecified: Secondary | ICD-10-CM | POA: Diagnosis not present

## 2023-05-25 DIAGNOSIS — E1169 Type 2 diabetes mellitus with other specified complication: Secondary | ICD-10-CM | POA: Diagnosis not present

## 2023-05-25 DIAGNOSIS — E1122 Type 2 diabetes mellitus with diabetic chronic kidney disease: Secondary | ICD-10-CM | POA: Diagnosis not present

## 2023-05-25 DIAGNOSIS — Z7982 Long term (current) use of aspirin: Secondary | ICD-10-CM | POA: Diagnosis not present

## 2023-05-25 DIAGNOSIS — E785 Hyperlipidemia, unspecified: Secondary | ICD-10-CM | POA: Diagnosis not present

## 2023-05-29 DIAGNOSIS — E871 Hypo-osmolality and hyponatremia: Secondary | ICD-10-CM | POA: Diagnosis not present

## 2023-05-29 DIAGNOSIS — E1122 Type 2 diabetes mellitus with diabetic chronic kidney disease: Secondary | ICD-10-CM | POA: Diagnosis not present

## 2023-05-29 DIAGNOSIS — I480 Paroxysmal atrial fibrillation: Secondary | ICD-10-CM | POA: Diagnosis not present

## 2023-05-29 DIAGNOSIS — Z7901 Long term (current) use of anticoagulants: Secondary | ICD-10-CM | POA: Diagnosis not present

## 2023-05-29 DIAGNOSIS — K8000 Calculus of gallbladder with acute cholecystitis without obstruction: Secondary | ICD-10-CM | POA: Diagnosis not present

## 2023-05-29 DIAGNOSIS — I959 Hypotension, unspecified: Secondary | ICD-10-CM | POA: Diagnosis not present

## 2023-05-29 DIAGNOSIS — N1831 Chronic kidney disease, stage 3a: Secondary | ICD-10-CM | POA: Diagnosis not present

## 2023-05-29 DIAGNOSIS — Z7982 Long term (current) use of aspirin: Secondary | ICD-10-CM | POA: Diagnosis not present

## 2023-05-29 DIAGNOSIS — D696 Thrombocytopenia, unspecified: Secondary | ICD-10-CM | POA: Diagnosis not present

## 2023-05-29 DIAGNOSIS — N2889 Other specified disorders of kidney and ureter: Secondary | ICD-10-CM | POA: Diagnosis not present

## 2023-05-29 DIAGNOSIS — E1169 Type 2 diabetes mellitus with other specified complication: Secondary | ICD-10-CM | POA: Diagnosis not present

## 2023-05-29 DIAGNOSIS — R339 Retention of urine, unspecified: Secondary | ICD-10-CM | POA: Diagnosis not present

## 2023-05-29 DIAGNOSIS — E785 Hyperlipidemia, unspecified: Secondary | ICD-10-CM | POA: Diagnosis not present

## 2023-05-29 DIAGNOSIS — A4150 Gram-negative sepsis, unspecified: Secondary | ICD-10-CM | POA: Diagnosis not present

## 2023-05-29 DIAGNOSIS — I129 Hypertensive chronic kidney disease with stage 1 through stage 4 chronic kidney disease, or unspecified chronic kidney disease: Secondary | ICD-10-CM | POA: Diagnosis not present

## 2023-06-04 ENCOUNTER — Ambulatory Visit
Admission: RE | Admit: 2023-06-04 | Discharge: 2023-06-04 | Disposition: A | Discharge status: Home / Self Care | Payer: Medicare Other | Source: Ambulatory Visit | Attending: Radiology | Admitting: Radiology

## 2023-06-04 ENCOUNTER — Ambulatory Visit
Admission: RE | Admit: 2023-06-04 | Discharge: 2023-06-04 | Disposition: A | Discharge status: Home / Self Care | Payer: Medicare Other | Source: Ambulatory Visit | Attending: General Surgery | Admitting: General Surgery

## 2023-06-04 DIAGNOSIS — R109 Unspecified abdominal pain: Secondary | ICD-10-CM

## 2023-06-04 DIAGNOSIS — K8020 Calculus of gallbladder without cholecystitis without obstruction: Secondary | ICD-10-CM | POA: Diagnosis not present

## 2023-06-04 HISTORY — PX: IR RADIOLOGIST EVAL & MGMT: IMG5224

## 2023-06-05 DIAGNOSIS — Z7982 Long term (current) use of aspirin: Secondary | ICD-10-CM | POA: Diagnosis not present

## 2023-06-05 DIAGNOSIS — E1122 Type 2 diabetes mellitus with diabetic chronic kidney disease: Secondary | ICD-10-CM | POA: Diagnosis not present

## 2023-06-05 DIAGNOSIS — D696 Thrombocytopenia, unspecified: Secondary | ICD-10-CM | POA: Diagnosis not present

## 2023-06-05 DIAGNOSIS — K8000 Calculus of gallbladder with acute cholecystitis without obstruction: Secondary | ICD-10-CM | POA: Diagnosis not present

## 2023-06-05 DIAGNOSIS — I480 Paroxysmal atrial fibrillation: Secondary | ICD-10-CM | POA: Diagnosis not present

## 2023-06-05 DIAGNOSIS — I959 Hypotension, unspecified: Secondary | ICD-10-CM | POA: Diagnosis not present

## 2023-06-05 DIAGNOSIS — E871 Hypo-osmolality and hyponatremia: Secondary | ICD-10-CM | POA: Diagnosis not present

## 2023-06-05 DIAGNOSIS — E1169 Type 2 diabetes mellitus with other specified complication: Secondary | ICD-10-CM | POA: Diagnosis not present

## 2023-06-05 DIAGNOSIS — E785 Hyperlipidemia, unspecified: Secondary | ICD-10-CM | POA: Diagnosis not present

## 2023-06-05 DIAGNOSIS — R339 Retention of urine, unspecified: Secondary | ICD-10-CM | POA: Diagnosis not present

## 2023-06-05 DIAGNOSIS — Z7901 Long term (current) use of anticoagulants: Secondary | ICD-10-CM | POA: Diagnosis not present

## 2023-06-05 DIAGNOSIS — N1831 Chronic kidney disease, stage 3a: Secondary | ICD-10-CM | POA: Diagnosis not present

## 2023-06-05 DIAGNOSIS — A4150 Gram-negative sepsis, unspecified: Secondary | ICD-10-CM | POA: Diagnosis not present

## 2023-06-05 DIAGNOSIS — I129 Hypertensive chronic kidney disease with stage 1 through stage 4 chronic kidney disease, or unspecified chronic kidney disease: Secondary | ICD-10-CM | POA: Diagnosis not present

## 2023-06-05 DIAGNOSIS — N2889 Other specified disorders of kidney and ureter: Secondary | ICD-10-CM | POA: Diagnosis not present

## 2023-06-06 ENCOUNTER — Other Ambulatory Visit: Payer: Self-pay | Admitting: General Surgery

## 2023-06-06 DIAGNOSIS — K819 Cholecystitis, unspecified: Secondary | ICD-10-CM | POA: Diagnosis not present

## 2023-06-09 DIAGNOSIS — E785 Hyperlipidemia, unspecified: Secondary | ICD-10-CM | POA: Diagnosis not present

## 2023-06-09 DIAGNOSIS — I129 Hypertensive chronic kidney disease with stage 1 through stage 4 chronic kidney disease, or unspecified chronic kidney disease: Secondary | ICD-10-CM | POA: Diagnosis not present

## 2023-06-09 DIAGNOSIS — E1129 Type 2 diabetes mellitus with other diabetic kidney complication: Secondary | ICD-10-CM | POA: Diagnosis not present

## 2023-06-09 DIAGNOSIS — N1831 Chronic kidney disease, stage 3a: Secondary | ICD-10-CM | POA: Diagnosis not present

## 2023-06-09 DIAGNOSIS — N390 Urinary tract infection, site not specified: Secondary | ICD-10-CM | POA: Diagnosis not present

## 2023-06-10 DIAGNOSIS — K8000 Calculus of gallbladder with acute cholecystitis without obstruction: Secondary | ICD-10-CM | POA: Diagnosis not present

## 2023-06-10 DIAGNOSIS — R339 Retention of urine, unspecified: Secondary | ICD-10-CM | POA: Diagnosis not present

## 2023-06-10 DIAGNOSIS — I480 Paroxysmal atrial fibrillation: Secondary | ICD-10-CM | POA: Diagnosis not present

## 2023-06-10 DIAGNOSIS — N1831 Chronic kidney disease, stage 3a: Secondary | ICD-10-CM | POA: Diagnosis not present

## 2023-06-10 DIAGNOSIS — I129 Hypertensive chronic kidney disease with stage 1 through stage 4 chronic kidney disease, or unspecified chronic kidney disease: Secondary | ICD-10-CM | POA: Diagnosis not present

## 2023-06-10 DIAGNOSIS — E1122 Type 2 diabetes mellitus with diabetic chronic kidney disease: Secondary | ICD-10-CM | POA: Diagnosis not present

## 2023-06-10 DIAGNOSIS — N2889 Other specified disorders of kidney and ureter: Secondary | ICD-10-CM | POA: Diagnosis not present

## 2023-06-10 DIAGNOSIS — E785 Hyperlipidemia, unspecified: Secondary | ICD-10-CM | POA: Diagnosis not present

## 2023-06-10 DIAGNOSIS — E871 Hypo-osmolality and hyponatremia: Secondary | ICD-10-CM | POA: Diagnosis not present

## 2023-06-10 DIAGNOSIS — A4150 Gram-negative sepsis, unspecified: Secondary | ICD-10-CM | POA: Diagnosis not present

## 2023-06-10 DIAGNOSIS — D696 Thrombocytopenia, unspecified: Secondary | ICD-10-CM | POA: Diagnosis not present

## 2023-06-10 DIAGNOSIS — Z7901 Long term (current) use of anticoagulants: Secondary | ICD-10-CM | POA: Diagnosis not present

## 2023-06-10 DIAGNOSIS — E1169 Type 2 diabetes mellitus with other specified complication: Secondary | ICD-10-CM | POA: Diagnosis not present

## 2023-06-10 DIAGNOSIS — Z7982 Long term (current) use of aspirin: Secondary | ICD-10-CM | POA: Diagnosis not present

## 2023-06-10 DIAGNOSIS — I959 Hypotension, unspecified: Secondary | ICD-10-CM | POA: Diagnosis not present

## 2023-06-23 ENCOUNTER — Other Ambulatory Visit: Payer: Self-pay | Admitting: General Surgery

## 2023-06-23 DIAGNOSIS — K810 Acute cholecystitis: Secondary | ICD-10-CM

## 2023-06-24 DIAGNOSIS — A4150 Gram-negative sepsis, unspecified: Secondary | ICD-10-CM | POA: Diagnosis not present

## 2023-07-02 ENCOUNTER — Ambulatory Visit
Admission: RE | Admit: 2023-07-02 | Discharge: 2023-07-02 | Disposition: A | Discharge status: Home / Self Care | Payer: Medicare Other | Source: Ambulatory Visit | Attending: General Surgery | Admitting: General Surgery

## 2023-07-02 DIAGNOSIS — E119 Type 2 diabetes mellitus without complications: Secondary | ICD-10-CM | POA: Diagnosis not present

## 2023-07-02 DIAGNOSIS — I10 Essential (primary) hypertension: Secondary | ICD-10-CM | POA: Diagnosis not present

## 2023-07-02 DIAGNOSIS — K810 Acute cholecystitis: Secondary | ICD-10-CM | POA: Diagnosis not present

## 2023-07-02 DIAGNOSIS — I4821 Permanent atrial fibrillation: Secondary | ICD-10-CM | POA: Diagnosis not present

## 2023-07-02 HISTORY — PX: IR RADIOLOGIST EVAL & MGMT: IMG5224

## 2023-07-02 MED ORDER — IOHEXOL 300 MG/ML  SOLN
10.0000 mL | Freq: Once | INTRAMUSCULAR | Status: AC | PRN
  Administered 2023-07-02: 10 mL

## 2023-07-02 NOTE — Progress Notes (Signed)
 Patient ID: Angel Shelton, male   DOB: 1955-12-17, 68 y.o.   MRN: [id number]       Chief Complaint:  C cholecystitis, status post cholecystostomy  Referring Physician(s): Wakefield,Matthew  History of Present Illness: Angel Shelton is a 68 y.o. male Who presented to the hospital with acute calculus cholecystitis.  He underwent percutaneous cholecystostomy 04/16/2023.  Overall since placement he has recovered at home and feels back to his baseline.  No current abdominal or right upper quadrant pain.  He returns for outpatient evaluation and injection of the cholecystostomy.  No recent fevers or illness.  Currently is not on any antibiotics.  He is flushing the catheter every other day.  Trace to minimal bilious output.  He states he is scheduled for cholecystectomy with Dr. Ebbie 07/23/2023.  Past Medical History:  Diagnosis Date   Acute GI bleeding 10/01/2021   Alcohol  abuse    Diabetes mellitus without complication (HCC)    Diverticulosis    Encounter for cardioversion procedure    GERD (gastroesophageal reflux disease)    Hyperlipidemia    Hypertension    Ischemic stroke of frontal lobe (HCC) 06/01/2021   Left atrial enlargement    Neuromuscular disorder (HCC)    neuropathy   Persistent atrial fibrillation (HCC)    CHA2DS2-VASc score 4 (age-79, HTN-1, DM 2-1, aortic plaque)   Stroke (HCC) 04/2021    Past Surgical History:  Procedure Laterality Date   APPENDECTOMY     BALLOON DILATION N/A 10/03/2021   Procedure: BALLOON DILATION;  Surgeon: Legrand Victory LITTIE DOUGLAS, MD;  Location: [facility] ENDOSCOPY;  Service: Gastroenterology;  Laterality: N/A;   BIOPSY  10/03/2021   Procedure: BIOPSY;  Surgeon: Legrand Victory LITTIE DOUGLAS, MD;  Location: [facility] ENDOSCOPY;  Service: Gastroenterology;;   CARDIAC CATHETERIZATION N/A 01/05/2015   Procedure: Left Heart Cath and Coronary Angiography;  Surgeon: Vinie JAYSON Maxcy, MD;  Location: [facility] INVASIVE CV LAB;  Service: Cardiovascular;; (For Abnormal Nuclear  Stress Test) mild luminal irregularities of the LCx with mild areas of ectasia.  Calcified ostial D1 mild stenosis. ->  Mild/nonobstructive CAD.   CARDIOVERSION N/A 11/01/2014   Procedure: CARDIOVERSION;  Surgeon: Vinie JAYSON Maxcy, MD;  Location: [facility] ENDOSCOPY;  Service: Cardiovascular;  Laterality: N/A;   CARDIOVERSION N/A 02/24/2015   Procedure: CARDIOVERSION;  Surgeon: Vinie JAYSON Maxcy, MD;  Location: [facility] ENDOSCOPY;  Service: Cardiovascular;  Laterality: N/A;   CATARACT EXTRACTION Bilateral    COLON SURGERY N/A 2006   14 inches removed from diverticitis perforation   COLONOSCOPY     Per stoma   ESOPHAGOGASTRODUODENOSCOPY (EGD) WITH PROPOFOL  N/A 10/03/2021   Procedure: ESOPHAGOGASTRODUODENOSCOPY (EGD) WITH PROPOFOL ;  Surgeon: Legrand Victory LITTIE DOUGLAS, MD;  Location: [facility] ENDOSCOPY;  Service: Gastroenterology;  Laterality: N/A;   EYE SURGERY Bilateral    Cat Sx   INNER EAR SURGERY     as a child   IR PERC CHOLECYSTOSTOMY  04/16/2023   IR RADIOLOGIST EVAL & MGMT  06/04/2023   NM MYOVIEW  LTD  12/08/2014   FALSE POSITIVE: Read asINTERMEDIATE RISK.  Partially reversible medium size moderate severity defect in the basal--mid-apical inferoseptal-inferior and apical walls.  Not gated due to A. fib. ->  Referred for cath- NON-OBSTRUCTIVE CAD   ROBOTIC ASSITED PARTIAL NEPHRECTOMY Right 05/23/2021   Procedure: XI RETROPERITONEAL ROBOTIC ASSITED PARTIAL NEPHRECTOMY;  Surgeon: Alvaro Hummer, MD;  Location: [facility] ORS;  Service: Urology;  Laterality: Right;   TEE WITHOUT CARDIOVERSION N/A 11/01/2014   Procedure: TRANSESOPHAGEAL ECHOCARDIOGRAM (TEE) -unsuccesful DCCV;  Surgeon:  Vinie JAYSON Maxcy, MD;  Location: [facility] ENDOSCOPY;  Service: Cardiovascular;; Mild concentric LVH.  EF 55 to 60%.  Normal wall motion.  Grade 3 aortic atheroma.  No source of cardiac emboli.  -->  Followed by unsuccessful DCCV.    Allergies: Patient has no known allergies.  Medications: Prior to Admission medications   Medication Sig Start Date  End Date Taking? Authorizing Provider  apixaban  (ELIQUIS ) 5 MG TABS tablet Take 1 tablet (5 mg total) by mouth 2 (two) times daily. 04/23/23   Dezii, Alexandra, DO  aspirin  EC 81 MG tablet Take 1 tablet (81 mg total) by mouth daily. Swallow whole. 04/24/23   Dezii, Alexandra, DO  Cyanocobalamin  (VITAMIN B-12 PO) Take 1 tablet by mouth at bedtime.    [provider]  fenofibrate  160 MG tablet Take 160 mg by mouth daily.    [provider]  finasteride  (PROSCAR ) 5 MG tablet Take 1 tablet (5 mg total) by mouth daily. 04/24/23   Dezii, Alexandra, DO  gabapentin  (NEURONTIN ) 600 MG tablet Take 600 mg by mouth 3 (three) times daily.    [provider]  insulin  aspart (NOVOLOG ) 100 UNIT/ML injection Inject 0-9 Units into the skin 3 (three) times daily with meals. 04/23/23   Dezii, Alexandra, DO  insulin  glargine-yfgn (SEMGLEE ) 100 UNIT/ML injection Inject 0.15 mLs (15 Units total) into the skin daily. 04/24/23   Dezii, Alexandra, DO  JARDIANCE 10 MG TABS tablet Take 10 mg by mouth daily. 04/25/22   [provider]  Lactobacillus-Inulin (PROBIOTIC DIGESTIVE SUPPORT PO) Take 1 tablet by mouth daily.    [provider]  metFORMIN (GLUCOPHAGE) 1000 MG tablet Take 1,000 mg by mouth 2 (two) times daily. 09/09/19   [provider]  metoprolol  succinate (TOPROL -XL) 100 MG 24 hr tablet Take 100 mg by mouth daily. 09/21/14   [provider]  Omega-3 Fatty Acids (FISH OIL) 1000 MG CAPS Take 1,200 mg by mouth daily.    [provider]  pantoprazole  (PROTONIX ) 40 MG tablet Take 1 tablet (40 mg total) by mouth daily before breakfast. 09/30/22   Danis, Victory LITTIE MOULD, MD  polyethylene glycol (MIRALAX  / GLYCOLAX ) 17 g packet Take 17 g by mouth daily as needed for mild constipation. 04/23/23   Dezii, Alexandra, DO  rosuvastatin  (CRESTOR ) 40 MG tablet Take 40 mg by mouth daily. 10/01/21   [provider]  tamsulosin  (FLOMAX ) 0.4 MG CAPS capsule Take 1  capsule (0.4 mg total) by mouth daily after supper. 04/23/23   Dezii, Alexandra, DO  Wheat Dextrin (BENEFIBER DRINK MIX) PACK Take 1 Package by mouth daily.    [provider]  XDEMVY  0.25 % SOLN Place 1 drop into both eyes in the morning and at bedtime. 03/25/23   [provider]     Family History  Problem Relation Age of Onset   Heart disease Father    Stroke Father    Diabetes Mother    Diabetes Maternal Grandmother    Stroke Maternal Grandmother    Heart attack Paternal Grandfather    Hodgkin's lymphoma Paternal Aunt    Hodgkin's lymphoma Paternal Uncle    Colon cancer Neg Hx    Esophageal cancer Neg Hx    Pancreatic cancer Neg Hx    Prostate cancer Neg Hx    Rectal cancer Neg Hx    Stomach cancer Neg Hx     Social History   Socioeconomic History   Marital status: Single    Spouse name: Not on  file   Number of children: 2   Years of education: Not on file   Highest education level: Not on file  Occupational History   Occupation: pharmacist, hospital  Tobacco Use   Smoking status: Former    Current packs/day: 0.00    Average packs/day: 1 pack/day for 30.0 years (30.0 ttl pk-yrs)    Types: Cigarettes    Start date: 03/05/1984    Quit date: 03/05/2014    Years since quitting: 9.3   Smokeless tobacco: Never  Vaping Use   Vaping status: Never Used  Substance and Sexual Activity   Alcohol  use: Yes    Alcohol /week: 14.0 standard drinks of alcohol     Types: 14 Shots of liquor per week   Drug use: No   Sexual activity: Not on file  Other Topics Concern   Not on file  Social History Narrative   Works in the engineer, technical sales   Right handed   Caffeine- 1 cup per day    Social Drivers of Health   Financial Resource Strain: Not on file  Food Insecurity: No Food Insecurity (04/15/2023)   Hunger Vital Sign    Worried About Running Out of Food in the Last Year: Never true    Ran Out of Food in the Last Year: Never true  Transportation  Needs: No Transportation Needs (04/15/2023)   PRAPARE - Administrator, Civil Service (Medical): No    Lack of Transportation (Non-Medical): No  Physical Activity: Not on file  Stress: Not on file  Social Connections: Not on file       Review of Systems: A 12 point ROS discussed and pertinent positives are indicated in the HPI above.  All other systems are negative.  Review of Systems  Vital Signs: There were no vitals taken for this visit.      Physical Exam Constitutional:      General: He is not in acute distress.    Appearance: He is normal weight. He is not toxic-appearing.  Eyes:     General: Scleral icterus present.     Conjunctiva/sclera: Conjunctivae normal.  Abdominal:     Palpations: Abdomen is soft.     Tenderness: There is no abdominal tenderness. There is no guarding.     Comments: Cholecystostomy site is clean, dry and intact  Skin:    General: Skin is warm and dry.     Coloration: Skin is not jaundiced.  Neurological:     General: No focal deficit present.     Mental Status: Mental status is at baseline.  Psychiatric:        Mood and Affect: Mood normal.        Thought Content: Thought content normal.           Imaging: IR Radiologist Eval & Mgmt Result Date: 06/04/2023 INDICATION: Cholelithiasis and abdominal pain, post cholecystostomy catheter placement 04/16/2023. Catheter working well with regular flushes. No new symptoms. EXAM: CHOLANGIOGRAM THROUGH EXISTING CATHETER MEDICATIONS: no periprocedural antibiotics were indicated ANESTHESIA/SEDATION: None required FLUOROSCOPY: Radiation Exposure Index (as provided by the fluoroscopic device): 2 MGy Kerma COMPLICATIONS: None immediate. PROCEDURE: Informed written consent was obtained from the patient after a thorough discussion of the procedural risks, benefits and alternatives. All questions were addressed. Inspection of cholecystostomy catheter entry site shows site to be clean, dry, intact,  no evidence of leakage. Injection of the catheter under fluoroscopy demonstrates catheter well positioned in the lumen of the decompressed gallbladder. Intraluminal filling defect consistent with gallstone(s).  Patent cystic duct. Contrast flows into the duodenum. No evidence of choledocholithiasis. With aspiration, gallbladder decompressed. IMPRESSION: Patent, well-positioned cholecystostomy catheter. Cholelithiasis without choledocholithiasis. PLAN: Patient is scheduled to follow-up with general surgery. If cholecystectomy is not imminent, consider catheter exchange in 6-8 weeks to maintain patency. Electronically Signed   By: JONETTA Faes M.D.   On: 06/04/2023 11:46   DG Sinus/Fist Tube Chk-Non GI Result Date: 06/04/2023 INDICATION: Cholelithiasis and abdominal pain, post cholecystostomy catheter placement 04/16/2023. Catheter working well with regular flushes. No new symptoms. EXAM: CHOLANGIOGRAM THROUGH EXISTING CATHETER MEDICATIONS: no periprocedural antibiotics were indicated ANESTHESIA/SEDATION: None required FLUOROSCOPY: Radiation Exposure Index (as provided by the fluoroscopic device): 2 MGy Kerma COMPLICATIONS: None immediate. PROCEDURE: Informed written consent was obtained from the patient after a thorough discussion of the procedural risks, benefits and alternatives. All questions were addressed. Inspection of cholecystostomy catheter entry site shows site to be clean, dry, intact, no evidence of leakage. Injection of the catheter under fluoroscopy demonstrates catheter well positioned in the lumen of the decompressed gallbladder. Intraluminal filling defect consistent with gallstone(s). Patent cystic duct. Contrast flows into the duodenum. No evidence of choledocholithiasis. With aspiration, gallbladder decompressed. IMPRESSION: Patent, well-positioned cholecystostomy catheter. Cholelithiasis without choledocholithiasis. PLAN: Patient is scheduled to follow-up with general surgery. If cholecystectomy  is not imminent, consider catheter exchange in 6-8 weeks to maintain patency. Electronically Signed   By: JONETTA Faes M.D.   On: 06/04/2023 11:46    Labs:  CBC: Recent Labs    04/16/23 0626 04/17/23 0554 04/20/23 1011 04/23/23 0859  WBC 8.8 8.7 8.1 6.8  HGB 11.8* 11.5* 13.2 13.1  HCT 33.9* 32.6* 38.6* 38.2*  PLT 95* 113* 252 272    COAGS: Recent Labs    04/15/23 1040 04/16/23 0626 04/17/23 0554  INR 1.6* 1.6* 1.4*    BMP: Recent Labs    04/17/23 0554 04/18/23 0623 04/20/23 1011 04/23/23 1057  NA 124* 127* 124* 130*  K 3.7 4.3 4.0 3.6  CL 95* 97* 90* 95*  CO2 23 23 24 29   GLUCOSE 111* 107* 312* 214*  BUN 22 26* 27* 25*  CALCIUM  9.7 10.4* 11.1* 11.3*  CREATININE 1.55* 1.56* 1.51* 1.36*  GFRNONAA 48* 48* 50* 57*    LIVER FUNCTION TESTS: Recent Labs    04/16/23 0626 04/17/23 0554 04/18/23 0623 04/23/23 1057  BILITOT 1.2* 0.9 1.3* 1.1  AST 42* 29 38 33  ALT 21 20 20 19   ALKPHOS 49 46 46 43  PROT 4.8* 4.5* 4.8* 5.3*  ALBUMIN  1.9* 1.9* 2.0* 2.3*       Assessment and Plan:  Status post Percutaneous cholecystostomy for acute calculus cholecystitis performed 04/16/2023.  Overall he is doing well with outpatient management of the cholecystostomy.  Cholecystostomy injection today demonstrates stable catheter position.  Large gallstone remains within the gallbladder lumen.  Gallbladder is collapsed.  Cystic duct remains patent.  Contrast easily drains into the duodenum.  Plan: We can Externally cap The catheter today since the cystic duct and common bile duct are patent.  He understands if he has any recurrent abdominal symptoms including pain or fever that he can reconnect to the gravity drainage bag as needed.  Surgery with Dr. Ebbie is scheduled for 07/23/2023.     Electronically Signed: Ozell Specking 07/02/2023, 11:43 AM   I spent a total of    25 Minutes in face to face in clinical consultation, greater than 50% of which was  counseling/coordinating care for This patient status post percutaneous cholecystostomy

## 2023-07-03 DIAGNOSIS — N390 Urinary tract infection, site not specified: Secondary | ICD-10-CM | POA: Diagnosis not present

## 2023-07-15 ENCOUNTER — Other Ambulatory Visit (HOSPITAL_COMMUNITY): Payer: Self-pay | Admitting: Pharmacist

## 2023-07-15 MED ORDER — STUDY - ASPIRE - ASPIRIN 81 MG OR PLACEBO TABLET (PI-SETHI): 81.0000 mg | ORAL_TABLET | Freq: Every day | ORAL | 0 refills | Status: DC

## 2023-07-15 MED ORDER — STUDY - ASPIRE - APIXABAN 5 MG OR PLACEBO TABLET (PI-SETHI): 5.0000 mg | ORAL_TABLET | Freq: Two times a day (BID) | ORAL | 0 refills | Status: DC

## 2023-07-17 DIAGNOSIS — B880 Other acariasis: Secondary | ICD-10-CM | POA: Diagnosis not present

## 2023-07-17 DIAGNOSIS — H01004 Unspecified blepharitis left upper eyelid: Secondary | ICD-10-CM | POA: Diagnosis not present

## 2023-07-17 DIAGNOSIS — H01001 Unspecified blepharitis right upper eyelid: Secondary | ICD-10-CM | POA: Diagnosis not present

## 2023-07-17 DIAGNOSIS — H4089 Other specified glaucoma: Secondary | ICD-10-CM | POA: Diagnosis not present

## 2023-07-17 NOTE — Pre-Procedure Instructions (Signed)
 Surgical Instructions   Your procedure is scheduled on July 23, 2023. Report to [facility] Main Entrance "A" at 8:30 A.M., then check in with the Admitting office. Any questions or running late day of surgery: call [phone]  Questions prior to your surgery date: call [phone], Monday-Friday, 8am-4pm. If you experience any cold or flu symptoms such as cough, fever, chills, shortness of breath, etc. between now and your scheduled surgery, please notify us at the above number.     Remember:  Do not eat after midnight the night before your surgery   You may drink clear liquids until 7:30 AM the morning of your surgery.   Clear liquids allowed are: Water, Non-Citrus Juices (without pulp), Carbonated Beverages, Clear Tea (no milk, honey, etc.), Black Coffee Only (NO MILK, CREAM OR POWDERED CREAMER of any kind), and Gatorade.  Patient Instructions  The night before surgery:  No food after midnight. ONLY clear liquids after midnight  The day of surgery (if you have diabetes): Drink ONE (1) 12 oz G2 given to you in your pre admission testing appointment by 7:30 AM the morning of surgery. Drink in one sitting. Do not sip.  This drink was given to you during your hospital  pre-op appointment visit.  Nothing else to drink after completing the  12 oz bottle of G2.         If you have questions, please contact your surgeon's office.    Take these medicines the morning of surgery with A SIP OF WATER: fenofibrate  finasteride (PROSCAR)  gabapentin (NEURONTIN)  metoprolol succinate (TOPROL-XL)  pantoprazole (PROTONIX)  rosuvastatin (CRESTOR)    Follow your surgeon's instructions on when to stop Aspirin/Placebo and Apixaban/Placebo.  If no instructions were given by your surgeon then you will need to call the office to get those instructions.      One week prior to surgery, STOP taking any Aleve, Naproxen, Ibuprofen, Motrin, Advil, Goody's, BC's, all herbal medications, fish  oil, and non-prescription vitamins.   WHAT DO I DO ABOUT MY DIABETES MEDICATION?   THE NIGHT BEFORE SURGERY/THE MORNING OF SURGERY, take 7 units of TOUJEO SOLOSTAR insulin.      STOP taking your JARDIANCE three days prior to surgery. Your last dose will be February 22nd.    HOW TO MANAGE YOUR DIABETES BEFORE AND AFTER SURGERY  Why is it important to control my blood sugar before and after surgery? Improving blood sugar levels before and after surgery helps healing and can limit problems. A way of improving blood sugar control is eating a healthy diet by:  Eating less sugar and carbohydrates  Increasing activity/exercise  Talking with your doctor about reaching your blood sugar goals High blood sugars (greater than 180 mg/dL) can raise your risk of infections and slow your recovery, so you will need to focus on controlling your diabetes during the weeks before surgery. Make sure that the doctor who takes care of your diabetes knows about your planned surgery including the date and location.  How do I manage my blood sugar before surgery? Check your blood sugar at least 4 times a day, starting 2 days before surgery, to make sure that the level is not too high or low.  Check your blood sugar the morning of your surgery when you wake up and every 2 hours until you get to the Short Stay unit.  If your blood sugar is less than 70 mg/dL, you will need to treat for low blood sugar: Do not take insulin.  Treat a low blood sugar (less than 70 mg/dL) with  cup of clear juice (cranberry or apple), 4 glucose tablets, OR glucose gel. Recheck blood sugar in 15 minutes after treatment (to make sure it is greater than 70 mg/dL). If your blood sugar is not greater than 70 mg/dL on recheck, call [phone] for further instructions. Report your blood sugar to the short stay nurse when you get to Short Stay.  If you are admitted to the hospital after surgery: Your blood sugar will be checked by the  staff and you will probably be given insulin after surgery (instead of oral diabetes medicines) to make sure you have good blood sugar levels. The goal for blood sugar control after surgery is 80-180 mg/dL.                      Do NOT Smoke (Tobacco/Vaping) for 24 hours prior to your procedure.  If you use a CPAP at night, you may bring your mask/headgear for your overnight stay.   You will be asked to remove any contacts, glasses, piercing's, hearing aid's, dentures/partials prior to surgery. Please bring cases for these items if needed.    Patients discharged the day of surgery will not be allowed to drive home, and someone needs to stay with them for 24 hours.  SURGICAL WAITING ROOM VISITATION Patients may have no more than 2 support people in the waiting area - these visitors may rotate.   Pre-op nurse will coordinate an appropriate time for 1 ADULT support person, who may not rotate, to accompany patient in pre-op.  Children under the age of 26 must have an adult with them who is not the patient and must remain in the main waiting area with an adult.  If the patient needs to stay at the hospital during part of their recovery, the visitor guidelines for inpatient rooms apply.  Please refer to the [facility] website for the visitor guidelines for any additional information.   If you received a COVID test during your pre-op visit  it is requested that you wear a mask when out in public, stay away from anyone that may not be feeling well and notify your surgeon if you develop symptoms. If you have been in contact with anyone that has tested positive in the last 10 days please notify you surgeon.      Pre-operative CHG Bathing Instructions   You can play a key role in reducing the risk of infection after surgery. Your skin needs to be as free of germs as possible. You can reduce the number of germs on your skin by washing with CHG (chlorhexidine gluconate) soap before surgery. CHG is  an antiseptic soap that kills germs and continues to kill germs even after washing.   DO NOT use if you have an allergy to chlorhexidine/CHG or antibacterial soaps. If your skin becomes reddened or irritated, stop using the CHG and notify one of our RNs at [phone].              TAKE A SHOWER THE NIGHT BEFORE SURGERY AND THE DAY OF SURGERY    Please keep in mind the following:  DO NOT shave, including legs and underarms, 48 hours prior to surgery.   You may shave your face before/day of surgery.  Place clean sheets on your bed the night before surgery Use a clean washcloth (not used since being washed) for each shower. DO NOT sleep with pet's night before surgery.  CHG Shower Instructions:  Wash your face and private area with normal soap. If you choose to wash your hair, wash first with your normal shampoo.  After you use shampoo/soap, rinse your hair and body thoroughly to remove shampoo/soap residue.  Turn the water OFF and apply half the bottle of CHG soap to a CLEAN washcloth.  Apply CHG soap ONLY FROM YOUR NECK DOWN TO YOUR TOES (washing for 3-5 minutes)  DO NOT use CHG soap on face, private areas, open wounds, or sores.  Pay special attention to the area where your surgery is being performed.  If you are having back surgery, having someone wash your back for you may be helpful. Wait 2 minutes after CHG soap is applied, then you may rinse off the CHG soap.  Pat dry with a clean towel  Put on clean pajamas    Additional instructions for the day of surgery: DO NOT APPLY any lotions, deodorants, cologne, or perfumes.   Do not wear jewelry or makeup Do not wear nail polish, gel polish, artificial nails, or any other type of covering on natural nails (fingers and toes) Do not bring valuables to the hospital. [facility] is not responsible for valuables/personal belongings. Put on clean/comfortable clothes.  Please brush your teeth.  Ask your nurse before applying any prescription  medications to the skin.

## 2023-07-18 ENCOUNTER — Other Ambulatory Visit: Payer: Self-pay

## 2023-07-18 ENCOUNTER — Encounter (HOSPITAL_COMMUNITY): Payer: Self-pay

## 2023-07-18 ENCOUNTER — Encounter (HOSPITAL_COMMUNITY)
Admission: RE | Admit: 2023-07-18 | Discharge: 2023-07-18 | Disposition: A | Discharge status: Home / Self Care | Payer: Medicare Other | Source: Ambulatory Visit | Attending: General Surgery | Admitting: General Surgery

## 2023-07-18 VITALS — BP 120/65 | HR 101 | Temp 97.8°F | Resp 18 | Ht 71.0 in | Wt 171.8 lb

## 2023-07-18 DIAGNOSIS — Z01818 Encounter for other preprocedural examination: Secondary | ICD-10-CM | POA: Insufficient documentation

## 2023-07-18 DIAGNOSIS — N3020 Other chronic cystitis without hematuria: Secondary | ICD-10-CM | POA: Diagnosis not present

## 2023-07-18 DIAGNOSIS — I4819 Other persistent atrial fibrillation: Secondary | ICD-10-CM | POA: Insufficient documentation

## 2023-07-18 DIAGNOSIS — R338 Other retention of urine: Secondary | ICD-10-CM | POA: Diagnosis not present

## 2023-07-18 LAB — COMPREHENSIVE METABOLIC PANEL
ALT: 11 U/L (ref 0–44)
AST: 24 U/L (ref 15–41)
Albumin: 3.3 g/dL — ABNORMAL LOW (ref 3.5–5.0)
Alkaline Phosphatase: 45 U/L (ref 38–126)
Anion gap: 8 (ref 5–15)
BUN: 17 mg/dL (ref 8–23)
CO2: 25 mmol/L (ref 22–32)
Calcium: 9.6 mg/dL (ref 8.9–10.3)
Chloride: 102 mmol/L (ref 98–111)
Creatinine, Ser: 1.88 mg/dL — ABNORMAL HIGH (ref 0.61–1.24)
GFR, Estimated: 38 mL/min — ABNORMAL LOW (ref >60)
Glucose, Bld: 273 mg/dL — ABNORMAL HIGH (ref 70–99)
Potassium: 4.7 mmol/L (ref 3.5–5.1)
Sodium: 135 mmol/L (ref 135–145)
Total Bilirubin: 1.1 mg/dL (ref 0.0–1.2)
Total Protein: 6.2 g/dL — ABNORMAL LOW (ref 6.5–8.1)

## 2023-07-18 LAB — HEMOGLOBIN A1C
Hgb A1c MFr Bld: 8.6 % — ABNORMAL HIGH (ref 4.8–5.6)
Mean Plasma Glucose: 200.12 mg/dL

## 2023-07-18 LAB — CBC
HCT: 41.1 % (ref 39.0–52.0)
Hemoglobin: 13.8 g/dL (ref 13.0–17.0)
MCH: 30.9 pg (ref 26.0–34.0)
MCHC: 33.6 g/dL (ref 30.0–36.0)
MCV: 91.9 fL (ref 80.0–100.0)
Platelets: 191 10*3/uL (ref 150–400)
RBC: 4.47 MIL/uL (ref 4.22–5.81)
RDW: 13.7 % (ref 11.5–15.5)
WBC: 9.2 10*3/uL (ref 4.0–10.5)
nRBC: 0 % (ref 0.0–0.2)

## 2023-07-18 LAB — GLUCOSE, CAPILLARY: Glucose-Capillary: 241 mg/dL — ABNORMAL HIGH (ref 70–99)

## 2023-07-18 NOTE — Progress Notes (Addendum)
 PCP - Dr. Martha Clan Cardiologist - Zoila Shutter - last seen 02/08/21   PPM/ICD - denies Device Orders - n/a Rep Notified - n/a  Chest x-ray - 04/14/23 EKG - 04/14/23 Stress Test - 12/08/14 ECHO - 06/02/21 Cardiac Cath - 01/05/15  Sleep Study - denies CPAP - n/a  Fasting Blood Sugar - 100 Checks Blood Sugar __1___ times a day  Last dose of GLP1 agonist-  n/a GLP1 instructions: n/a Last dose of Jardiance will be 2/22.    Blood Thinner Instructions:  Patient instructed to hold Eliquis 48 hours prior to procedure - last dose will be 2/23 - patient is aware.   Aspirin Instructions:  Per clearance - patient's Aspirin to be held for 7 days prior to procedure - patient was not aware prior to PAT appointment - last dose will be 07/18/23  ERAS Protcol - clears until 0730 PRE-SURGERY Ensure or G2-  G2   COVID TEST- n/a   Anesthesia review: yes - A.Fib  Patient denies shortness of breath, fever, cough and chest pain at PAT appointment   All instructions explained to the patient, with a verbal understanding of the material. Patient agrees to go over the instructions while at home for a better understanding. Patient also instructed to self quarantine after being tested for COVID-19. The opportunity to ask questions was provided.  Notified Michelle at Dr. Doreen Salvage office that patient does not have anyone to stay with him after the procedure.  Per Marcelino Duster, patient should be spending the night.

## 2023-07-18 NOTE — Progress Notes (Signed)
 Anesthesia Chart Review:  Case: [id number] Date/Time: 07/23/23 1015   Procedure: LAPAROSCOPIC CHOLECYSTECTOMY - GENERAL & TAP BLOCK   Anesthesia type: General   Pre-op diagnosis: Gallstones   [facility]: [facility] ROOM 02 / [facility]   Surgeons: Emelia Loron, MD       DISCUSSION: Patient is a 68 year old male scheduled for the above procedure. He was hospitalized 04/14/23 - 04/23/23 for acute cholecystitis with sepsis. General surgery consulted and started on antibiotics. S/p cholecystostomy drain per IR on 04/16/23. Culture from cholecystotomy tube grew Klebsiella that was sensitive to Unasyn, and discharged on Augmentin with plans for out-patient cholecystectomy. Hospital course complicated by urinary retention requiring foley catheter 04/20/23 with out-patient urology follow-up. He had short-term SNF placement after discharge. Cholangiogram through existing catheter on 06/04/23 showed a patent, well-positioned cholecystostomy catheter with cholelithiasis without choledocholithiasis. If cholecystectomy not imminent, then would consider catheter exchange in 6-8 weeks to maintain patency.   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  After his CVA with hemorrhagic component ~ 05/2021, he was enrolled in the ASPIRE study (double-blinded trial ASA 81 mg or placebo, versus Eliquis 5 mg or placebo). He was admitted 10/01/21-10/04/21 for acute GI bleed and AKI . HGB as low as 5.4 and Creatinine peaked at 2.4. He was transfused with 2 units PRBC. 10/03/21 EGD showed non-bleeding duodenal ulcers. He was managed on PPI and kept of the study medication until he could have repeat EGD. Creatinine improved to 1.39 by discharge. Follow-up EGD 10/25/21 showed small hiatal hernia, benign appearing esophageal stenosis, erythematous and friable (with contact bleeding) mucosa in the greater curvature of the gastric body (pathology: "iron pill" gastritis, no malignancy), non-bleeding duodenal ulcer with no stigmata of bleeding. Per Dr. Irving Burton note, "As stated in patient GI progress notes, there is an unknown risk of regarding ulcer non-healing or recurrence if this patient resumes his study medicine. Risks/benefits of continuing it require ongoing physician-patient discussions with Neurology." At PAT, he reported that is is on known ASA and Eliquis due to CVA history and afib.  He has previously been followed by cardiologist Dr. Bryan Lemma and EP Dr. Hillis Range for afib history dating back to 2016. He failed DCCV and ultimately was managed with rate control strategy as he was asymptomatic of his afib.  He had only luminal irregularities on 2016 LHC for false positive stress test. His last visit with Dr. Herbie Baltimore was on 02/08/21 for follow-up permanent afib and HTN. He says chronic afib is now managed per his PCP Dr. Clelia Croft. He denied chest pain, SOB, or any new CV symptoms.   His last echo was done on 06/02/21 during admission for CVA. Findings showed LVEF 60-65%, no regional wall motion abnormalities, grade 1 diastolic dysfunction, normal RV systolic function, normal PASP, RVSP 31.6 mmHg, severely dilated LA, moderately dilated RA,  mild MR, no intracardiac source  of emboli noted on TTE. 11/01/14 TEE was negative for PFO.   Medical clearance per PCP Cleatis Polka., MD. On 06/10/23, he wrote, "OK to proceed with surgery. Hold Aspirin for 7 days and Eliquis for 48 hrs prior to surgery." (Scanned under Media tab.) Last Eliquis planned for 07/20/23 and ASA 07/18/23.   Creatinine trends somewhat variable with underlying CKD, primarily stage 3, but with Creatinine rising up to 2.4 with eGFR 32 in May 2023 during admission for GI bleed with hypotension, and Creating ) and Creatinine up to 2.10 with eGFR 34 during November 2024 (admission for cholecystitis/sepsis with urinary retention).  Creatinine ~ 1.35-1.55 by 04/23/23 discharge. Labs on 07/18/23 showed BUN 17, Creatinine 1.88, eGFR 38 consistent with CKD Stage 3b. [facility] records suggest he was referred to [facility] in May 2024. He is already followed by urology for renal cancer.   A1c 8.6% on 07/18/23 (previously 7.4% on 04/15/23). He is on Toujeo 14 units daily, Jardiance 10 mg daily. Jardiance to be held for surgery starting after 07/19/23 doses.   Labs routed to Dr. Dwain Sarna. Anesthesia team to evaluate on the day of surgery. He will get a CBG on arrival. (UPDATE 07/22/23 11:44 AM: Dr. Dwain Sarna requested repeat BMP on arrival to re-evaluate renal function. Urgent order for day of surgery entered.)   VS: BP 120/65   Pulse (!) 101   Temp 36.6 C   Resp 18   Ht 5\' 11"  (1.803 m)   Wt 77.9 kg   SpO2 100%   BMI 23.96 kg/m  Pulse Readings from Last 3 Encounters:  07/18/23 (!) 101  04/23/23 89  11/12/21 69    PROVIDERS: Cleatis Polka., MD is PCP  Bryan Lemma, MD is cardiologist. Last visit 02/09/21, as chronic afib is now being managed by his PCP Dr. Clelia Croft.  Sebastian Ache, MD is urologist Hoy Finlay, MD is GI Delia Heady, MD is neurologist   LABS: Preoperative labs noted. See DISCUSSION. Lab results routed to Dr.  Dwain Sarna for review. Mr. Pulse will get a CBG on arrival. If no acute changes, then will defer additional preoperative labs orders, if any, to surgeon. (all labs ordered are listed, but only abnormal results are displayed)  Labs Reviewed  GLUCOSE, CAPILLARY - Abnormal; Notable for the following components:      Result Value   Glucose-Capillary 241 (*)    All other components within normal limits  COMPREHENSIVE METABOLIC PANEL - Abnormal; Notable for the following components:   Glucose, Bld 273 (*)    Creatinine, Ser 1.88 (*)    Total Protein 6.2 (*)    Albumin 3.3 (*)    GFR, Estimated 38 (*)    All other components within normal limits  HEMOGLOBIN A1C - Abnormal; Notable for the following components:   Hgb A1c MFr Bld 8.6 (*)    All other components within normal limits  CBC     IMAGES: [place] Abd (RUQ) 04/14/23: IMPRESSION: Cholelithiasis with evidence of acute cholecystitis.   CT Abd/pelvis 04/14/23: IMPRESSION: 1. Cholelithiasis with acute cholecystitis. Associated 1.6 cm gallstone likely impacted within the gallbladder neck. No CT evidence of choledocholithiasis. Recommend surgical consultation. 2. Bowel thickening of the hepatic flexure and mild bowel wall thickening of the ascending colon in the setting of colonic diverticulosis. Finding likely reactive in etiology. Given slight focal irregular bowel thickening along the colonic diverticula, differential diagnosis includes much less likely concurrent acute diverticulitis. Consider colonoscopy status post treatment and  status post complete resolution of inflammatory changes to exclude an underlying lesion. 3. Incidentally noted possible thinning of the gastric fundal wall. Limited evaluation due to under distension of the stomach. Recommend correlation with signs and symptoms of gastric ulceration. Consider outpatient endoscopy for direct visualization if clinically indicated. 4. Nonobstructive bilateral  nephrolithiasis measuring up to 7 mm on the left and punctate on the right. 5. Status post partial right nephrectomy. 6. Prostate is prominent in size with associated urinary bladder distension with urine and mild urinary bladder wall trabeculations. Correlate with signs and symptoms of obstructive uropathy. 7.  Aortic Atherosclerosis (ICD10-I70.0)-severe.   EKG: 04/16/23: Atrial fibrillation at 95 bpm Nonspecific repol abnormality, diffuse leads Confirmed by Estelle June [phone]) on 04/14/2023 2:42:59 PM   CV: Echo 06/02/21: IMPRESSIONS   1. Left ventricular ejection fraction, by estimation, is 60 to 65%. The  left ventricle has normal function. The left ventricle has no regional  wall motion abnormalities. Left ventricular diastolic parameters are  consistent with Grade I diastolic  dysfunction (impaired relaxation).   2. Right ventricular systolic function is normal. The right ventricular  size is mildly enlarged. There is normal pulmonary artery systolic  pressure. The estimated right ventricular systolic pressure is 31.6 mmHg.   3. Left atrial size was severely dilated.   4. Right atrial size was moderately dilated.   5. The mitral valve is normal in structure. Mild mitral valve  regurgitation. No evidence of mitral stenosis.   6. The aortic valve is normal in structure. Aortic valve regurgitation is  not visualized. No aortic stenosis is present.   7. The inferior vena cava is dilated in size with >50% respiratory  variability, suggesting right atrial pressure of 8 mmHg.  - Conclusion(s)/Recommendation(s): No intracardiac source of embolism  detected on this transthoracic study. Consider a transesophageal  echocardiogram to exclude cardiac source of embolism if clinically  indicated.   - No atrial septal defect or PFO identified on 11/01/14 TEE.   Cardiac cath 01/05/15: The left ventricular systolic function is normal. LVEF 55%, normal wall motion Mild luminal  irregularities of the LCX, with mild areas of ectasia. Calcified ostial D1 with mild stenosis.   Mild non-obstructive CAD. False positive nuclear stress test. Will start flecainide 50 mg BID tomorrow. Restart Eliquis on 01/07/15. Follow-up in the office next week for an EKG on flecainide. Will need follow-up with me in 3-4 weeks to assess if he is still in a-fib, at which time if he persists we could schedule cardioversion.    Nuclear stress test 12/08/14: Defect 1: There is a medium defect of moderate severity present in the basal inferoseptal, basal inferior, mid inferoseptal, mid inferior, apical inferior and apex location. This is an intermediate risk study. The defect is partially reversible.   Intermediate risk lexiscan myocardial perfusion study demonstrating a medium size, moderate severity defect predominant fixed defect distally to basally in the inferior, inferoseptal wall with mild partial reversibilty  apically. Due to atrial fibrillation, the study was not gated.   Past Medical History:  Diagnosis Date   Acute GI bleeding 10/01/2021   Alcohol abuse    Diabetes mellitus without complication (HCC)    Diverticulosis    Encounter for cardioversion procedure    GERD (gastroesophageal reflux disease)    Hyperlipidemia    Hypertension    Ischemic stroke of frontal lobe (HCC) 06/01/2021   Left atrial enlargement    Neuromuscular disorder (HCC)    neuropathy   Persistent atrial fibrillation (HCC)  CHA2DS2-VASc score 4 (age-40, HTN-1, DM 2-1, aortic plaque)   Stroke (HCC) 04/2021    Past Surgical History:  Procedure Laterality Date   APPENDECTOMY     BALLOON DILATION N/A 10/03/2021   Procedure: BALLOON DILATION;  Surgeon: Sherrilyn Rist, MD;  Location: [facility] ENDOSCOPY;  Service: Gastroenterology;  Laterality: N/A;   BIOPSY  10/03/2021   Procedure: BIOPSY;  Surgeon: Sherrilyn Rist, MD;  Location: [facility] ENDOSCOPY;  Service: Gastroenterology;;   CARDIAC CATHETERIZATION N/A  01/05/2015   Procedure: Left Heart Cath and Coronary Angiography;  Surgeon: Chrystie Nose, MD;  Location: [facility] INVASIVE CV LAB;  Service: Cardiovascular;; (For Abnormal Nuclear Stress Test) mild luminal irregularities of the LCx with mild areas of ectasia.  Calcified ostial D1 mild stenosis. ->  Mild/nonobstructive CAD.   CARDIOVERSION N/A 11/01/2014   Procedure: CARDIOVERSION;  Surgeon: Chrystie Nose, MD;  Location: [facility] ENDOSCOPY;  Service: Cardiovascular;  Laterality: N/A;   CARDIOVERSION N/A 02/24/2015   Procedure: CARDIOVERSION;  Surgeon: Chrystie Nose, MD;  Location: [facility] ENDOSCOPY;  Service: Cardiovascular;  Laterality: N/A;   CATARACT EXTRACTION Bilateral    COLON SURGERY N/A 2006   14 inches removed from diverticitis perforation   COLONOSCOPY     Per stoma   ESOPHAGOGASTRODUODENOSCOPY (EGD) WITH PROPOFOL N/A 10/03/2021   Procedure: ESOPHAGOGASTRODUODENOSCOPY (EGD) WITH PROPOFOL;  Surgeon: Sherrilyn Rist, MD;  Location: [facility] ENDOSCOPY;  Service: Gastroenterology;  Laterality: N/A;   EYE SURGERY Bilateral    Cat Sx   HAND SURGERY Right 02/1977   thumb   INNER EAR SURGERY     as a child   IR PERC CHOLECYSTOSTOMY  04/16/2023   IR RADIOLOGIST EVAL & MGMT  06/04/2023   IR RADIOLOGIST EVAL & MGMT  07/02/2023   NM MYOVIEW LTD  12/08/2014   FALSE POSITIVE: Read asINTERMEDIATE RISK.  Partially reversible medium size moderate severity defect in the basal--mid-apical inferoseptal-inferior and apical walls.  Not gated due to A. fib. ->  Referred for cath- NON-OBSTRUCTIVE CAD   ROBOTIC ASSITED PARTIAL NEPHRECTOMY Right 05/23/2021   Procedure: XI RETROPERITONEAL ROBOTIC ASSITED PARTIAL NEPHRECTOMY;  Surgeon: Sebastian Ache, MD;  Location: [facility] ORS;  Service: Urology;  Laterality: Right;   TEE WITHOUT CARDIOVERSION N/A 11/01/2014   Procedure: TRANSESOPHAGEAL ECHOCARDIOGRAM (TEE) -unsuccesful DCCV;  Surgeon: Chrystie Nose, MD;  Location[facility] ENDOSCOPY;  Service: Cardiovascular;; Mild concentric  LVH.  EF 55 to 60%.  Normal wall motion.  Grade 3 aortic atheroma.  No source of cardiac emboli.  -->  Followed by unsuccessful DCCV.    MEDICATIONS:  cholecalciferol (VITAMIN D3) 25 MCG (1000 UNIT) tablet   Cyanocobalamin (VITAMIN B-12 PO)   fenofibrate 160 MG tablet   finasteride (PROSCAR) 5 MG tablet   gabapentin (NEURONTIN) 600 MG tablet   JARDIANCE 10 MG TABS tablet   Lactobacillus-Inulin (PROBIOTIC DIGESTIVE SUPPORT PO)   metoprolol succinate (TOPROL-XL) 100 MG 24 hr tablet   Omega-3 Fatty Acids (FISH OIL) 1000 MG CAPS   pantoprazole (PROTONIX) 40 MG tablet   rosuvastatin (CRESTOR) 40 MG tablet   STUDY - ASPIRE - apixaban 5 mg or placebo tablet (PI-Sethi)   STUDY - ASPIRE - aspirin 81 mg or placebo tablet (PI-Sethi)   tamsulosin (FLOMAX) 0.4 MG CAPS capsule   TOUJEO SOLOSTAR 300 UNIT/ML Solostar Pen   Wheat Dextrin (BENEFIBER DRINK MIX) PACK   No current facility-administered medications for this encounter.    Shonna Chock, PA-C Surgical Short Stay/Anesthesiology [facility] Phone [phone] [facility] Phone [phone])  [phone] 07/21/2023 10:57 AM

## 2023-07-21 NOTE — Anesthesia Preprocedure Evaluation (Signed)
 Anesthesia Evaluation  Patient identified by MRN, date of birth, ID band Patient awake    Reviewed: Allergy & Precautions, H&P , NPO status , Patient's Chart, lab work & pertinent test results  Airway Mallampati: II  TM Distance: >3 FB Neck ROM: full    Dental no notable dental hx.    Pulmonary former smoker   Pulmonary exam normal breath sounds clear to auscultation       Cardiovascular hypertension, Pt. on medications Normal cardiovascular exam+ dysrhythmias Atrial Fibrillation  Rhythm:regular Rate:Normal     Neuro/Psych  Neuromuscular disease CVA    GI/Hepatic ,GERD  ,,  Endo/Other  diabetes, Type 2    Renal/GU Renal InsufficiencyRenal disease     Musculoskeletal   Abdominal   Peds  Hematology  (+) Blood dyscrasia, anemia   Anesthesia Other Findings   Reproductive/Obstetrics                             Anesthesia Physical Anesthesia Plan  ASA: 3  Anesthesia Plan: General   Post-op Pain Management: Regional block* and Tylenol PO (pre-op)*   Induction: Intravenous  PONV Risk Score and Plan: 2 and Treatment may vary due to age or medical condition, Ondansetron and Midazolam  Airway Management Planned: Oral ETT  Additional Equipment:   Intra-op Plan:   Post-operative Plan: Extubation in OR  Informed Consent: I have reviewed the patients History and Physical, chart, labs and discussed the procedure including the risks, benefits and alternatives for the proposed anesthesia with the patient or authorized representative who has indicated his/her understanding and acceptance.     Dental advisory given  Plan Discussed with: CRNA, Anesthesiologist and Surgeon  Anesthesia Plan Comments: (PAT note written 07/21/2023 by Shonna Chock, PA-C. For repeat BMP on arrival.   )        Anesthesia Quick Evaluation

## 2023-07-22 NOTE — H&P (Signed)
 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 abs notable for Cr 2.0, WBC 14, Lactate 2.2, Tbili 2.9. Remainder of LFTs WNL. He underwent perc chole and improved. He was sent to SNF for some time. Had foley prolonged period of time also. He is much better now. Eating well, last tb on 12/11 was normal. Cr was normal also. IR studied him 1/8 and has gallstones present but ductal system patent. He is here to discuss surgery  Review of Systems: A complete review of systems was obtained from the patient. I have reviewed this information and discussed as appropriate with the patient. See HPI as well for other ROS.  Review of Systems  Constitutional: Positive for weight loss.  All other systems reviewed and are negative.   Medical History: Past Medical History:  Diagnosis Date  GERD (gastroesophageal reflux disease)  History of stroke   Past Surgical History:  Procedure Laterality Date  CRYOSURGICAL ABLATION KIDNEY LESION   No Known Allergies  Current Outpatient Medications on File Prior to Visit  Medication Sig Dispense Refill  apixaban (ELIQUIS) 5 mg tablet Take 5 mg by mouth 2 (two) times daily  fenofibrate 160 MG tablet Take 160 mg by mouth once daily  finasteride (PROSCAR) 5 mg tablet Take 5 mg by mouth once daily  gabapentin (NEURONTIN) 600 MG tablet Take 600 mg by mouth 3 (three) times daily  JARDIANCE 10 mg tablet Take 10 mg by mouth once daily  LANTUS SOLOSTAR U-100 INSULIN pen injector (concentration 100 units/mL) INJECT 15 UNITS UNDER THE SKIN EVERY EVENING  metoprolol SUCCinate (TOPROL-XL) 100 MG XL tablet  pantoprazole (PROTONIX) 40 MG DR tablet Take 40 mg by mouth  rosuvastatin (CRESTOR) 40 MG tablet Take 40 mg by mouth once daily  tamsulosin  (FLOMAX) 0.4 mg capsule Take 0.4 mg by mouth once daily   Family History  Problem Relation Age of Onset  Diabetes Mother  Skin cancer Father    Social History   Tobacco Use  Smoking Status Former  Types: Cigarettes  Smokeless Tobacco Never Marital status: Single  Tobacco Use  Smoking status: Former  Types: Cigarettes  Smokeless tobacco: Never  Substance and Sexual Activity  Alcohol use: Not Currently  Drug use: Never   Objective:   Vitals:  06/06/23 1122 06/06/23 1127  Pulse: 96  Temp: 36.4 C (97.5 F)  SpO2: (!) 80%  Weight: 76.4 kg (168 lb 6.4 oz)  Height: 177.8 cm (5\' 10" )  PainSc: 0-No pain   Body mass index is 24.16 kg/m.  Physical Exam Vitals reviewed.  Constitutional:  Appearance: Normal appearance.  Abdominal:  Palpations: Abdomen is soft.  Tenderness: There is no abdominal tenderness.  Hernia: No hernia is present.  Comments: Chole tube functional  Neurological:  Mental Status: He is alert.   Assessment and Plan:   Cholecystitis  Lap chole, ICG  I do think he needs gb out. This is likely going to be difficult given his surgical history as well as gb itself. I discussed the procedure in detail. We discussed the risks and benefits of a laparoscopic cholecystectomy and possible cholangiogram including, but not limited to bleeding, infection, injury to surrounding structures such as the intestine or liver, bile leak, retained gallstones, need to convert to an open procedure, prolonged diarrhea,  blood clots such as DVT, common bile duct injury, anesthesia risks, and possible need for additional procedures. The likelihood of improvement in symptoms and return to the patient's normal status is good. We discussed the typical post-operative recovery course.

## 2023-07-23 ENCOUNTER — Other Ambulatory Visit: Payer: Self-pay

## 2023-07-23 ENCOUNTER — Ambulatory Visit (HOSPITAL_COMMUNITY): Payer: Medicare Other | Admitting: Vascular Surgery

## 2023-07-23 ENCOUNTER — Encounter (HOSPITAL_COMMUNITY): Payer: Self-pay | Admitting: General Surgery

## 2023-07-23 ENCOUNTER — Ambulatory Visit (HOSPITAL_COMMUNITY): Payer: Self-pay | Admitting: Vascular Surgery

## 2023-07-23 ENCOUNTER — Encounter (HOSPITAL_COMMUNITY)
Admission: RE | Disposition: A | Discharge status: Home / Self Care | Payer: Self-pay | Source: Home / Self Care | Attending: General Surgery

## 2023-07-23 ENCOUNTER — Observation Stay (HOSPITAL_COMMUNITY)
Admission: RE | Admit: 2023-07-23 | Discharge: 2023-07-24 | Disposition: A | Discharge status: Home / Self Care | Payer: Medicare Other | Attending: General Surgery | Admitting: General Surgery

## 2023-07-23 DIAGNOSIS — K8012 Calculus of gallbladder with acute and chronic cholecystitis without obstruction: Principal | ICD-10-CM | POA: Insufficient documentation

## 2023-07-23 DIAGNOSIS — R7989 Other specified abnormal findings of blood chemistry: Secondary | ICD-10-CM

## 2023-07-23 DIAGNOSIS — K8000 Calculus of gallbladder with acute cholecystitis without obstruction: Secondary | ICD-10-CM | POA: Diagnosis not present

## 2023-07-23 DIAGNOSIS — I10 Essential (primary) hypertension: Secondary | ICD-10-CM | POA: Diagnosis not present

## 2023-07-23 DIAGNOSIS — I4821 Permanent atrial fibrillation: Secondary | ICD-10-CM

## 2023-07-23 DIAGNOSIS — Z87891 Personal history of nicotine dependence: Secondary | ICD-10-CM | POA: Diagnosis not present

## 2023-07-23 DIAGNOSIS — I129 Hypertensive chronic kidney disease with stage 1 through stage 4 chronic kidney disease, or unspecified chronic kidney disease: Secondary | ICD-10-CM | POA: Diagnosis not present

## 2023-07-23 DIAGNOSIS — K8010 Calculus of gallbladder with chronic cholecystitis without obstruction: Secondary | ICD-10-CM | POA: Diagnosis not present

## 2023-07-23 DIAGNOSIS — E119 Type 2 diabetes mellitus without complications: Secondary | ICD-10-CM | POA: Diagnosis not present

## 2023-07-23 DIAGNOSIS — N1831 Chronic kidney disease, stage 3a: Secondary | ICD-10-CM | POA: Diagnosis not present

## 2023-07-23 DIAGNOSIS — Z7901 Long term (current) use of anticoagulants: Secondary | ICD-10-CM | POA: Diagnosis not present

## 2023-07-23 DIAGNOSIS — I4891 Unspecified atrial fibrillation: Secondary | ICD-10-CM | POA: Insufficient documentation

## 2023-07-23 DIAGNOSIS — I639 Cerebral infarction, unspecified: Secondary | ICD-10-CM

## 2023-07-23 DIAGNOSIS — K8020 Calculus of gallbladder without cholecystitis without obstruction: Secondary | ICD-10-CM | POA: Diagnosis not present

## 2023-07-23 DIAGNOSIS — K811 Chronic cholecystitis: Secondary | ICD-10-CM | POA: Diagnosis not present

## 2023-07-23 DIAGNOSIS — G8918 Other acute postprocedural pain: Secondary | ICD-10-CM | POA: Diagnosis not present

## 2023-07-23 DIAGNOSIS — Z79899 Other long term (current) drug therapy: Secondary | ICD-10-CM | POA: Diagnosis not present

## 2023-07-23 DIAGNOSIS — Z8673 Personal history of transient ischemic attack (TIA), and cerebral infarction without residual deficits: Secondary | ICD-10-CM | POA: Insufficient documentation

## 2023-07-23 DIAGNOSIS — Z9049 Acquired absence of other specified parts of digestive tract: Secondary | ICD-10-CM

## 2023-07-23 DIAGNOSIS — Z01818 Encounter for other preprocedural examination: Principal | ICD-10-CM

## 2023-07-23 HISTORY — PX: CHOLECYSTECTOMY: SHX55

## 2023-07-23 LAB — CBC
HCT: 38.9 % — ABNORMAL LOW (ref 39.0–52.0)
Hemoglobin: 12.9 g/dL — ABNORMAL LOW (ref 13.0–17.0)
MCH: 30.5 pg (ref 26.0–34.0)
MCHC: 33.2 g/dL (ref 30.0–36.0)
MCV: 92 fL (ref 80.0–100.0)
Platelets: 145 10*3/uL — ABNORMAL LOW (ref 150–400)
RBC: 4.23 MIL/uL (ref 4.22–5.81)
RDW: 13.7 % (ref 11.5–15.5)
WBC: 9.4 10*3/uL (ref 4.0–10.5)
nRBC: 0 % (ref 0.0–0.2)

## 2023-07-23 LAB — BASIC METABOLIC PANEL
Anion gap: 13 (ref 5–15)
BUN: 17 mg/dL (ref 8–23)
CO2: 23 mmol/L (ref 22–32)
Calcium: 9.6 mg/dL (ref 8.9–10.3)
Chloride: 106 mmol/L (ref 98–111)
Creatinine, Ser: 1.65 mg/dL — ABNORMAL HIGH (ref 0.61–1.24)
GFR, Estimated: 45 mL/min — ABNORMAL LOW (ref >60)
Glucose, Bld: 112 mg/dL — ABNORMAL HIGH (ref 70–99)
Potassium: 3.6 mmol/L (ref 3.5–5.1)
Sodium: 142 mmol/L (ref 135–145)

## 2023-07-23 LAB — GLUCOSE, CAPILLARY
Glucose-Capillary: 99 mg/dL (ref 70–99)
Glucose-Capillary: 144 mg/dL — ABNORMAL HIGH (ref 70–99)
Glucose-Capillary: 206 mg/dL — ABNORMAL HIGH (ref 70–99)
Glucose-Capillary: 338 mg/dL — ABNORMAL HIGH (ref 70–99)

## 2023-07-23 SURGERY — LAPAROSCOPIC CHOLECYSTECTOMY
Anesthesia: General

## 2023-07-23 MED ORDER — SPY AGENT GREEN - (INDOCYANINE FOR INJECTION)
1.2500 mg | Freq: Once | INTRAMUSCULAR | Status: AC
  Administered 2023-07-23: 1.25 mg via INTRAVENOUS
  Filled 2023-07-23: qty 10

## 2023-07-23 MED ORDER — CEFAZOLIN SODIUM-DEXTROSE 2-4 GM/100ML-% IV SOLN
2.0000 g | INTRAVENOUS | Status: AC
  Administered 2023-07-23: 2 g via INTRAVENOUS
  Filled 2023-07-23: qty 100

## 2023-07-23 MED ORDER — ROPIVACAINE HCL 5 MG/ML IJ SOLN
INTRAMUSCULAR | Status: DC | PRN
  Administered 2023-07-23: 40 mL via PERINEURAL

## 2023-07-23 MED ORDER — METOPROLOL SUCCINATE ER 50 MG PO TB24: 100.0000 mg | ORAL_TABLET | Freq: Every day | ORAL | Status: DC

## 2023-07-23 MED ORDER — OXYCODONE HCL 5 MG PO TABS: 5.0000 mg | ORAL_TABLET | Freq: Four times a day (QID) | ORAL | 0 refills | Status: DC | PRN

## 2023-07-23 MED ORDER — MORPHINE SULFATE (PF) 2 MG/ML IV SOLN: 2.0000 mg | INTRAVENOUS | Status: DC | PRN

## 2023-07-23 MED ORDER — TAMSULOSIN HCL 0.4 MG PO CAPS
0.8000 mg | ORAL_CAPSULE | Freq: Every day | ORAL | Status: DC
  Administered 2023-07-23: 0.8 mg via ORAL
  Filled 2023-07-23: qty 2

## 2023-07-23 MED ORDER — ACETAMINOPHEN 500 MG PO TABS
1000.0000 mg | ORAL_TABLET | Freq: Four times a day (QID) | ORAL | Status: DC
  Administered 2023-07-23 – 2023-07-24 (×2): 1000 mg via ORAL
  Filled 2023-07-23 (×2): qty 2

## 2023-07-23 MED ORDER — ACETAMINOPHEN 500 MG PO TABS
1000.0000 mg | ORAL_TABLET | ORAL | Status: AC
  Administered 2023-07-23: 1000 mg via ORAL
  Filled 2023-07-23: qty 2

## 2023-07-23 MED ORDER — SODIUM CHLORIDE 0.9 % IR SOLN
Status: DC | PRN
  Administered 2023-07-23: 1

## 2023-07-23 MED ORDER — SODIUM CHLORIDE 0.9 % IV SOLN: 12.5000 mg | INTRAVENOUS | Status: DC | PRN

## 2023-07-23 MED ORDER — FENTANYL CITRATE (PF) 100 MCG/2ML IJ SOLN: 100.0000 ug | Freq: Once | INTRAMUSCULAR | Status: AC

## 2023-07-23 MED ORDER — OXYCODONE HCL 5 MG PO TABS: 5.0000 mg | ORAL_TABLET | Freq: Once | ORAL | Status: DC | PRN

## 2023-07-23 MED ORDER — SUGAMMADEX SODIUM 200 MG/2ML IV SOLN
INTRAVENOUS | Status: DC | PRN
  Administered 2023-07-23: 154.2 mg via INTRAVENOUS

## 2023-07-23 MED ORDER — ONDANSETRON HCL 4 MG/2ML IJ SOLN: 4.0000 mg | Freq: Four times a day (QID) | INTRAMUSCULAR | Status: DC | PRN

## 2023-07-23 MED ORDER — DEXAMETHASONE SODIUM PHOSPHATE 10 MG/ML IJ SOLN
INTRAMUSCULAR | Status: DC | PRN
  Administered 2023-07-23: 4 mg via INTRAVENOUS

## 2023-07-23 MED ORDER — FINASTERIDE 5 MG PO TABS
5.0000 mg | ORAL_TABLET | Freq: Every day | ORAL | Status: DC
  Filled 2023-07-23: qty 1

## 2023-07-23 MED ORDER — OXYCODONE HCL 5 MG/5ML PO SOLN: 5.0000 mg | Freq: Once | ORAL | Status: DC | PRN

## 2023-07-23 MED ORDER — AMISULPRIDE (ANTIEMETIC) 5 MG/2ML IV SOLN: 10.0000 mg | Freq: Once | INTRAVENOUS | Status: DC | PRN

## 2023-07-23 MED ORDER — METHOCARBAMOL 1000 MG/10ML IJ SOLN: 500.0000 mg | Freq: Three times a day (TID) | INTRAMUSCULAR | Status: DC | PRN

## 2023-07-23 MED ORDER — ONDANSETRON 4 MG PO TBDP
4.0000 mg | ORAL_TABLET | Freq: Four times a day (QID) | ORAL | Status: DC | PRN
Start: 2023-07-23 — End: 2023-07-24

## 2023-07-23 MED ORDER — PANTOPRAZOLE SODIUM 40 MG PO TBEC: 40.0000 mg | DELAYED_RELEASE_TABLET | Freq: Every day | ORAL | Status: DC

## 2023-07-23 MED ORDER — INSULIN ASPART 100 UNIT/ML IJ SOLN
0.0000 [IU] | Freq: Every day | INTRAMUSCULAR | Status: DC
Start: 2023-07-23 — End: 2023-07-24
  Administered 2023-07-23: 4 [IU] via SUBCUTANEOUS

## 2023-07-23 MED ORDER — SUGAMMADEX SODIUM 200 MG/2ML IV SOLN
INTRAVENOUS | Status: AC
  Filled 2023-07-23: qty 2

## 2023-07-23 MED ORDER — LACTATED RINGERS IV SOLN: INTRAVENOUS | Status: DC

## 2023-07-23 MED ORDER — HYDROMORPHONE HCL 1 MG/ML IJ SOLN: 0.2500 mg | INTRAMUSCULAR | Status: DC | PRN

## 2023-07-23 MED ORDER — ENSURE PRE-SURGERY PO LIQD: 296.0000 mL | Freq: Once | ORAL | Status: DC

## 2023-07-23 MED ORDER — OXYCODONE HCL 5 MG PO TABS
5.0000 mg | ORAL_TABLET | ORAL | Status: DC | PRN
  Administered 2023-07-23 – 2023-07-24 (×2): 5 mg via ORAL
  Filled 2023-07-23 (×2): qty 1

## 2023-07-23 MED ORDER — VASOPRESSIN 20 UNIT/ML IV SOLN
INTRAVENOUS | Status: DC | PRN
  Administered 2023-07-23 (×3): 2 [IU] via INTRAVENOUS
  Administered 2023-07-23: 3 [IU] via INTRAVENOUS
  Administered 2023-07-23: 2 [IU] via INTRAVENOUS

## 2023-07-23 MED ORDER — ORAL CARE MOUTH RINSE: 15.0000 mL | Freq: Once | OROMUCOSAL | Status: AC

## 2023-07-23 MED ORDER — CHLORHEXIDINE GLUCONATE 0.12 % MT SOLN
15.0000 mL | Freq: Once | OROMUCOSAL | Status: AC
  Administered 2023-07-23: 15 mL via OROMUCOSAL
  Filled 2023-07-23: qty 15

## 2023-07-23 MED ORDER — MELATONIN 3 MG PO TABS
3.0000 mg | ORAL_TABLET | Freq: Every day | ORAL | Status: DC
Start: 2023-07-23 — End: 2023-07-23

## 2023-07-23 MED ORDER — 0.9 % SODIUM CHLORIDE (POUR BTL) OPTIME
TOPICAL | Status: DC | PRN
  Administered 2023-07-23: 1000 mL

## 2023-07-23 MED ORDER — ROSUVASTATIN CALCIUM 20 MG PO TABS: 40.0000 mg | ORAL_TABLET | Freq: Every day | ORAL | Status: DC

## 2023-07-23 MED ORDER — ROCURONIUM BROMIDE 10 MG/ML (PF) SYRINGE
PREFILLED_SYRINGE | INTRAVENOUS | Status: DC | PRN
  Administered 2023-07-23: 60 mg via INTRAVENOUS

## 2023-07-23 MED ORDER — SODIUM CHLORIDE 0.9 % IV SOLN: INTRAVENOUS | Status: AC

## 2023-07-23 MED ORDER — INSULIN ASPART 100 UNIT/ML IJ SOLN
0.0000 [IU] | Freq: Three times a day (TID) | INTRAMUSCULAR | Status: DC
  Administered 2023-07-24: 5 [IU] via SUBCUTANEOUS

## 2023-07-23 MED ORDER — FENTANYL CITRATE (PF) 250 MCG/5ML IJ SOLN
INTRAMUSCULAR | Status: AC
  Filled 2023-07-23: qty 5

## 2023-07-23 MED ORDER — CHLORHEXIDINE GLUCONATE CLOTH 2 % EX PADS: 6.0000 | MEDICATED_PAD | Freq: Once | CUTANEOUS | Status: DC

## 2023-07-23 MED ORDER — PROPOFOL 10 MG/ML IV BOLUS
INTRAVENOUS | Status: AC
  Filled 2023-07-23: qty 20

## 2023-07-23 MED ORDER — FENOFIBRATE 160 MG PO TABS
160.0000 mg | ORAL_TABLET | Freq: Every day | ORAL | Status: DC
  Filled 2023-07-23: qty 1

## 2023-07-23 MED ORDER — EPHEDRINE SULFATE-NACL 50-0.9 MG/10ML-% IV SOSY
PREFILLED_SYRINGE | INTRAVENOUS | Status: DC | PRN
  Administered 2023-07-23: 5 mg via INTRAVENOUS

## 2023-07-23 MED ORDER — MIDAZOLAM HCL 2 MG/2ML IJ SOLN
INTRAMUSCULAR | Status: AC
  Filled 2023-07-23: qty 2

## 2023-07-23 MED ORDER — FENTANYL CITRATE (PF) 100 MCG/2ML IJ SOLN
INTRAMUSCULAR | Status: AC
  Administered 2023-07-23: 100 ug via INTRAVENOUS
  Filled 2023-07-23: qty 2

## 2023-07-23 MED ORDER — PHENYLEPHRINE 80 MCG/ML (10ML) SYRINGE FOR IV PUSH (FOR BLOOD PRESSURE SUPPORT)
PREFILLED_SYRINGE | INTRAVENOUS | Status: DC | PRN
  Administered 2023-07-23: 400 ug via INTRAVENOUS
  Administered 2023-07-23: 160 ug via INTRAVENOUS
  Administered 2023-07-23: 320 ug via INTRAVENOUS

## 2023-07-23 MED ORDER — PHENYLEPHRINE HCL-NACL 20-0.9 MG/250ML-% IV SOLN
INTRAVENOUS | Status: DC | PRN
  Administered 2023-07-23: 75 ug/min via INTRAVENOUS

## 2023-07-23 MED ORDER — FENTANYL CITRATE (PF) 250 MCG/5ML IJ SOLN
INTRAMUSCULAR | Status: DC | PRN
  Administered 2023-07-23: 150 ug via INTRAVENOUS

## 2023-07-23 MED ORDER — PROPOFOL 10 MG/ML IV BOLUS
INTRAVENOUS | Status: DC | PRN
  Administered 2023-07-23: 100 mg via INTRAVENOUS

## 2023-07-23 MED ORDER — BUPIVACAINE-EPINEPHRINE 0.25% -1:200000 IJ SOLN
INTRAMUSCULAR | Status: DC | PRN
  Administered 2023-07-23: 6.5 mL

## 2023-07-23 MED ORDER — MELATONIN 3 MG PO TABS
3.0000 mg | ORAL_TABLET | Freq: Once | ORAL | Status: AC
  Administered 2023-07-23: 3 mg via ORAL
  Filled 2023-07-23: qty 1

## 2023-07-23 MED ORDER — ALBUMIN HUMAN 5 % IV SOLN: INTRAVENOUS | Status: DC | PRN

## 2023-07-23 MED ORDER — BUPIVACAINE-EPINEPHRINE (PF) 0.25% -1:200000 IJ SOLN
INTRAMUSCULAR | Status: AC
  Filled 2023-07-23: qty 30

## 2023-07-23 MED ORDER — GABAPENTIN 300 MG PO CAPS
600.0000 mg | ORAL_CAPSULE | Freq: Three times a day (TID) | ORAL | Status: DC
  Administered 2023-07-23: 600 mg via ORAL
  Filled 2023-07-23 (×2): qty 2

## 2023-07-23 MED ORDER — FENTANYL CITRATE (PF) 250 MCG/5ML IJ SOLN
INTRAMUSCULAR | Status: AC
Start: 2023-07-23 — End: ?
  Filled 2023-07-23: qty 5

## 2023-07-23 MED ORDER — HEPARIN SODIUM (PORCINE) 5000 UNIT/ML IJ SOLN
5000.0000 [IU] | Freq: Three times a day (TID) | INTRAMUSCULAR | Status: DC
  Administered 2023-07-24: 5000 [IU] via SUBCUTANEOUS
  Filled 2023-07-23: qty 1

## 2023-07-23 MED ORDER — ONDANSETRON HCL 4 MG/2ML IJ SOLN
INTRAMUSCULAR | Status: DC | PRN
  Administered 2023-07-23: 4 mg via INTRAVENOUS

## 2023-07-23 MED ORDER — MIDAZOLAM HCL 2 MG/2ML IJ SOLN: 2.0000 mg | Freq: Once | INTRAMUSCULAR | Status: AC

## 2023-07-23 MED ORDER — METHOCARBAMOL 500 MG PO TABS: 500.0000 mg | ORAL_TABLET | Freq: Three times a day (TID) | ORAL | Status: DC | PRN

## 2023-07-23 MED ORDER — MIDAZOLAM HCL 2 MG/2ML IJ SOLN
INTRAMUSCULAR | Status: AC
Start: 2023-07-23 — End: 2023-07-23
  Administered 2023-07-23: 2 mg via INTRAVENOUS
  Filled 2023-07-23: qty 2

## 2023-07-23 SURGICAL SUPPLY — 4 items
APPLIER CLIP 5 13 M/L LIGAMAX5 (MISCELLANEOUS) ×1 IMPLANT
ELECT REM PT RETURN 9FT ADLT (ELECTROSURGICAL) ×1 IMPLANT
IRRIG SUCT STRYKERFLOW 2 WTIP (MISCELLANEOUS) ×1 IMPLANT
SUCTION POOLE HANDLE (INSTRUMENTS) ×1 IMPLANT

## 2023-07-23 NOTE — Anesthesia Procedure Notes (Signed)
 Anesthesia Regional Block: TAP block   Pre-Anesthetic Checklist: , timeout performed,  Correct Patient, Correct Site, Correct Laterality,  Correct Procedure, Correct Position, site marked,  Risks and benefits discussed,  Surgical consent,  Pre-op evaluation,  At surgeon's request and post-op pain management  Laterality: Left and Right  Prep: chloraprep       Needles:  Injection technique: Single-shot  Needle Type: Stimiplex     Needle Length: 9cm  Needle Gauge: 21     Additional Needles:   Procedures:,,,, ultrasound used (permanent image in chart),,    Narrative:  Start time: 07/23/2023 9:49 AM End time: 07/23/2023 9:54 AM Injection made incrementally with aspirations every 5 mL.  Performed by: Personally  Anesthesiologist: [name], MD

## 2023-07-23 NOTE — Discharge Instructions (Signed)

## 2023-07-23 NOTE — Anesthesia Procedure Notes (Signed)
 Procedure Name: Intubation Date/Time: 07/23/2023 10:07 AM  Performed by: Randon Goldsmith, CRNAPre-anesthesia Checklist: Patient identified, Emergency Drugs available, Suction available and Patient being monitored Patient Re-evaluated:Patient Re-evaluated prior to induction Oxygen Delivery Method: Circle system utilized Preoxygenation: Pre-oxygenation with 100% oxygen Induction Type: IV induction Ventilation: Mask ventilation without difficulty Laryngoscope Size: Mac and 4 Grade View: Grade I Tube type: Oral Tube size: 7.5 mm Number of attempts: 1 Airway Equipment and Method: Stylet and Oral airway Placement Confirmation: ETT inserted through vocal cords under direct vision, positive ETCO2 and breath sounds checked- equal and bilateral Secured at: 22 cm Tube secured with: Tape Dental Injury: Teeth and Oropharynx as per pre-operative assessment

## 2023-07-23 NOTE — Op Note (Addendum)
 Preoperative diagnosis: Acute cholecystitis treated with percutaneous cholecystostomy Postoperative diagnosis: Same as above Procedure: 1.  Laparoscopy with lysis of adhesions x 30 minutes 2.  Laparoscopic cholecystectomy Surgeon: Dr. 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  He presented previously with acute cholecystitis but was also noted to have a creatinine of 2 and a total bilirubin of 2.9.  Due to his medical conditions we placed a percutaneous cholecystostomy.  He improved.  His ductal system is patent on an IR study but he still continues to have some pain we discussed proceeding with a cholecystectomy.  Procedure: After informed consent was obtained he was taken to the operating room.  He had undergone bilateral tap blocks.  He was given antibiotics.  SCDs were in place.  He was placed under general anesthesia without complication.  He was prepped and draped in the standard sterile surgical fashion.  A surgical timeout was then performed.  Due to his prior surgeries I elected to make a small incision in his right upper quadrant.  I dissected down I incised the fascia.  I then entered the peritoneum bluntly without injury with a Kelly clamp.  I looked at this area later and there indeed was no injury.  I was then able to insufflated the abdomen to 15 mmHg pressure.  His right upper quadrant as well as his midline had scar tissue to the abdominal wall.  This was primarily the omentum.  I put 2 left-sided  trocars then and spent about 30 minutes lysing of adhesions so I eventually could identify the gallbladder and cleared off enough space so that I can do his surgery.  I then placed a 12 mm trocar in the left paramedian abdomen.  I placed a 5 mm trocar in the epigastrium and another 1 in the right upper quadrant.  I was able to identify the gallbladder.  The percutaneous cholecystostomy cystostomy was removed.  The gallbladder was adherent to the abdominal wall which I took down.  It had a lot of adhesions to the omentum that I took down with a combination of blunt dissection as well as some cautery that was used judiciously.  Eventually I was able to retract the gallbladder cephalad and lateral.  He had evidence of very significant chronic cholecystitis.  Using the green dye I was able to identify my common bile duct as well as the gallbladder which filled in the very short cystic duct.  I was able to with a combination of dissecting in the triangle as well as doing this top-down to get this to the point where I had the cystic duct and cystic artery.  I clipped the cystic artery and divided it.  I then used Endoloops on the duct.  I placed 2 proximally and 1 distally and divided it leaving 2 Endoloops in place.  The gallbladder was then replaced in a retrieval bag.  Eventually I remove this from my 12 mm port site.  I obtained hemostasis.  I did place a 69 [place] Blake drain in the right upper quadrant and secured this with a 2-0  nylon suture.  I irrigated the abdomen.  I then removed all the trocars and desufflated the abdomen.  I had closed the 12 mm trocar with a 0 Vicryl externally.  I did place an additional suture with the suture passer device before desufflated.  I then closed the remainder with 4-0 Monocryl and glue.  He tolerated this well was extubated and transferred recovery stable.

## 2023-07-23 NOTE — Plan of Care (Signed)
  Problem: Education: Goal: Knowledge of General Education information will improve Description: Including pain rating scale, medication(s)/side effects and non-pharmacologic comfort measures Outcome: Progressing   Problem: Clinical Measurements: Goal: Ability to maintain clinical measurements within normal limits will improve Outcome: Progressing Goal: Cardiovascular complication will be avoided Outcome: Progressing   Problem: Activity: Goal: Risk for activity intolerance will decrease Outcome: Progressing   Problem: Nutrition: Goal: Adequate nutrition will be maintained Outcome: Progressing   Problem: Coping: Goal: Level of anxiety will decrease Outcome: Progressing   Problem: Pain Managment: Goal: General experience of comfort will improve and/or be controlled Outcome: Progressing

## 2023-07-23 NOTE — Anesthesia Postprocedure Evaluation (Signed)
 Anesthesia Post Note  Patient: Angel Shelton  Procedure(s) Performed: LAPAROSCOPIC CHOLECYSTECTOMY     Patient location during evaluation: PACU Anesthesia Type: General Level of consciousness: awake and alert Pain management: pain level controlled Vital Signs Assessment: post-procedure vital signs reviewed and stable Respiratory status: spontaneous breathing, nonlabored ventilation and respiratory function stable Cardiovascular status: blood pressure returned to baseline and stable Postop Assessment: no apparent nausea or vomiting Anesthetic complications: no   No notable events documented.  Last Vitals:  Vitals:   07/23/23 1256 07/23/23 1312  BP:  113/71  Pulse: 80 90  Resp: 15   Temp: 36.9 C (!) 36.4 C  SpO2: 96% 97%    Last Pain:  Vitals:   07/23/23 1312  TempSrc: Oral  PainSc:                  [name]

## 2023-07-23 NOTE — Transfer of Care (Signed)
 Immediate Anesthesia Transfer of Care Note  Patient: Angel Shelton  Procedure(s) Performed: LAPAROSCOPIC CHOLECYSTECTOMY  Patient Location: PACU  Anesthesia Type:GA combined with regional for post-op pain  Level of Consciousness: drowsy  Airway & Oxygen Therapy: Patient Spontanous Breathing  Post-op Assessment: Report given to RN and Post -op Vital signs reviewed and stable  Post vital signs: Reviewed and stable  Last Vitals:  Vitals Value Taken Time  BP 112/72 07/23/23 1148  Temp    Pulse 86 07/23/23 1150  Resp 12 07/23/23 1150  SpO2 93 % 07/23/23 1150  Vitals shown include unfiled device data.  Last Pain:  Vitals:   07/23/23 0953  PainSc: 0-No pain         Complications: No notable events documented.

## 2023-07-24 ENCOUNTER — Encounter (HOSPITAL_COMMUNITY): Payer: Self-pay | Admitting: General Surgery

## 2023-07-24 DIAGNOSIS — Z8673 Personal history of transient ischemic attack (TIA), and cerebral infarction without residual deficits: Secondary | ICD-10-CM | POA: Diagnosis not present

## 2023-07-24 DIAGNOSIS — I4891 Unspecified atrial fibrillation: Secondary | ICD-10-CM | POA: Diagnosis not present

## 2023-07-24 DIAGNOSIS — I10 Essential (primary) hypertension: Secondary | ICD-10-CM | POA: Diagnosis not present

## 2023-07-24 DIAGNOSIS — Z79899 Other long term (current) drug therapy: Secondary | ICD-10-CM | POA: Diagnosis not present

## 2023-07-24 DIAGNOSIS — Z7901 Long term (current) use of anticoagulants: Secondary | ICD-10-CM | POA: Diagnosis not present

## 2023-07-24 DIAGNOSIS — Z87891 Personal history of nicotine dependence: Secondary | ICD-10-CM | POA: Diagnosis not present

## 2023-07-24 DIAGNOSIS — K8012 Calculus of gallbladder with acute and chronic cholecystitis without obstruction: Secondary | ICD-10-CM | POA: Diagnosis not present

## 2023-07-24 LAB — GLUCOSE, CAPILLARY: Glucose-Capillary: 217 mg/dL — ABNORMAL HIGH (ref 70–99)

## 2023-07-24 LAB — SURGICAL PATHOLOGY

## 2023-07-24 NOTE — TOC Transition Note (Signed)
 Transition of Care [facility]) - Discharge Note   Patient Details  Name: Angel Shelton MRN: [id number] Date of Birth: 17-May-1956  [facility]) CM/SW Contact:  Harriet Masson, RN Phone Number: 07/24/2023, 9:18 AM   Clinical Narrative:     Patient stable to discharge home.  No TOC needs at this time.  Final next level of care: Home/Self Care Barriers to Discharge: Barriers Resolved   Patient Goals and CMS Choice Patient states their goals for this hospitalization and ongoing recovery are:: return home          Discharge Placement               Home         Discharge Plan and Services Additional resources added to the After Visit Summary for                                       Social Drivers of Health (SDOH) Interventions SDOH Screenings   Food Insecurity: No Food Insecurity (07/23/2023)  Housing: High Risk (07/23/2023)  Transportation Needs: No Transportation Needs (07/23/2023)  Utilities: Not At Risk (07/23/2023)  Social Connections: Unknown (07/23/2023)  Tobacco Use: Medium Risk (07/23/2023)     Readmission Risk Interventions    10/04/2021   12:20 PM  Readmission Risk Prevention Plan  Transportation Screening Complete  PCP or Specialist Appt within 5-7 Days Complete  Home Care Screening Complete  Medication Review (RN CM) Complete

## 2023-07-24 NOTE — Progress Notes (Signed)
 Patient ID: Angel Shelton, male   DOB: 1956-02-25, 68 y.o.   MRN: [id number]   Doing well Minimal pain Looks great Drain serosang  Plan: D/c home with drain

## 2023-07-24 NOTE — Discharge Summary (Signed)
 Physician Discharge Summary  Patient ID: REGINA COPPOLINO MRN: [id number] DOB/AGE: 69-04-1956 69 y.o.  Admit date: 07/23/2023 Discharge date: 07/24/2023  Admission Diagnoses:  Discharge Diagnoses:  Principal Problem:   S/P laparoscopic cholecystectomy   Discharged Condition: good  Hospital Course: uneventful post op recovery.  Doing well on POD#1 with no pain.  Tolerating po. Plan discharge home  Consults: None  Significant Diagnostic Studies:   Treatments: surgery: laparoscopic cholecystectomy   Discharge Exam: Blood pressure 126/87, pulse 89, temperature 97.9 F (36.6 C), resp. rate 18, height 5\' 11"  (1.803 m), weight 77.1 kg, SpO2 97%. General appearance: alert, cooperative, and no distress Resp: clear to auscultation bilaterally Incision/Wound: abdomen soft, drain serosang  Disposition: Discharge disposition: 01-Home or Self Care      Going home with the drain in place  Allergies as of 07/24/2023   No Known Allergies      Medication List     TAKE these medications    apixaban or placebo 5 mg Tabs tablet Take 1 tablet (5 mg total) by mouth 2 (two) times daily. For Investigational Use Only.   aspirin or placebo 81 mg Tabs tablet Take 1 tablet (81 mg total) by mouth daily. For Investigational use Only   Benefiber Drink Mix Pack Take 1 Package by mouth daily.   cholecalciferol 25 MCG (1000 UNIT) tablet Commonly known as: VITAMIN D3 Take 1,000 Units by mouth daily.   fenofibrate 160 MG tablet Take 160 mg by mouth daily.   finasteride 5 MG tablet Commonly known as: PROSCAR Take 1 tablet (5 mg total) by mouth daily.   Fish Oil 1000 MG Caps Take 1,200 mg by mouth daily.   gabapentin 600 MG tablet Commonly known as: NEURONTIN Take 600 mg by mouth 3 (three) times daily.   Jardiance 10 MG Tabs tablet Generic drug: empagliflozin Take 10 mg by mouth daily.   metoprolol succinate 100 MG 24 hr tablet Commonly known as: TOPROL-XL Take 100 mg by  mouth daily.   oxyCODONE 5 MG immediate release tablet Commonly known as: Oxy IR/ROXICODONE Take 1 tablet (5 mg total) by mouth every 6 (six) hours as needed.   pantoprazole 40 MG tablet Commonly known as: PROTONIX Take 1 tablet (40 mg total) by mouth daily before breakfast.   PROBIOTIC DIGESTIVE SUPPORT PO Take 1 tablet by mouth daily.   rosuvastatin 40 MG tablet Commonly known as: CRESTOR Take 40 mg by mouth daily.   tamsulosin 0.4 MG Caps capsule Commonly known as: FLOMAX Take 1 capsule (0.4 mg total) by mouth daily after supper. What changed: how much to take   Toujeo SoloStar 300 UNIT/ML Solostar Pen Generic drug: insulin glargine (1 Unit Dial) Inject 14 Units into the skin daily.   VITAMIN B-12 PO Take 1 tablet by mouth at bedtime.        Follow-up Information     Emelia Loron, MD Follow up in 3 week(s).   Specialty: General Surgery Contact information: [address] [place] [address] [phone]                 Signed: Abigail Miyamoto 07/24/2023, 9:00 AM

## 2023-07-29 ENCOUNTER — Other Ambulatory Visit: Payer: Self-pay

## 2023-07-29 DIAGNOSIS — E11319 Type 2 diabetes mellitus with unspecified diabetic retinopathy without macular edema: Secondary | ICD-10-CM | POA: Diagnosis not present

## 2023-08-12 DIAGNOSIS — R338 Other retention of urine: Secondary | ICD-10-CM | POA: Diagnosis not present

## 2023-08-12 DIAGNOSIS — N3020 Other chronic cystitis without hematuria: Secondary | ICD-10-CM | POA: Diagnosis not present

## 2023-09-10 DIAGNOSIS — E1129 Type 2 diabetes mellitus with other diabetic kidney complication: Secondary | ICD-10-CM | POA: Diagnosis not present

## 2023-09-10 DIAGNOSIS — I10 Essential (primary) hypertension: Secondary | ICD-10-CM | POA: Diagnosis not present

## 2023-09-25 ENCOUNTER — Other Ambulatory Visit (HOSPITAL_COMMUNITY): Payer: Self-pay | Admitting: Neurology

## 2023-09-25 MED ORDER — STUDY - ASPIRE - APIXABAN 5 MG OR PLACEBO TABLET (PI-SETHI): 5.0000 mg | ORAL_TABLET | Freq: Two times a day (BID) | ORAL | 0 refills | Status: DC

## 2023-09-25 MED ORDER — STUDY - ASPIRE - ASPIRIN 81 MG OR PLACEBO TABLET (PI-SETHI): 81.0000 mg | ORAL_TABLET | Freq: Every day | ORAL | 0 refills | Status: DC

## 2023-10-03 DIAGNOSIS — E11319 Type 2 diabetes mellitus with unspecified diabetic retinopathy without macular edema: Secondary | ICD-10-CM | POA: Diagnosis not present

## 2023-10-03 DIAGNOSIS — E785 Hyperlipidemia, unspecified: Secondary | ICD-10-CM | POA: Diagnosis not present

## 2023-10-03 DIAGNOSIS — Z125 Encounter for screening for malignant neoplasm of prostate: Secondary | ICD-10-CM | POA: Diagnosis not present

## 2023-10-10 DIAGNOSIS — J439 Emphysema, unspecified: Secondary | ICD-10-CM | POA: Diagnosis not present

## 2023-10-10 DIAGNOSIS — E1129 Type 2 diabetes mellitus with other diabetic kidney complication: Secondary | ICD-10-CM | POA: Diagnosis not present

## 2023-10-10 DIAGNOSIS — R82998 Other abnormal findings in urine: Secondary | ICD-10-CM | POA: Diagnosis not present

## 2023-10-10 DIAGNOSIS — I129 Hypertensive chronic kidney disease with stage 1 through stage 4 chronic kidney disease, or unspecified chronic kidney disease: Secondary | ICD-10-CM | POA: Diagnosis not present

## 2023-10-17 ENCOUNTER — Other Ambulatory Visit: Payer: Self-pay | Admitting: Internal Medicine

## 2023-10-17 DIAGNOSIS — F17201 Nicotine dependence, unspecified, in remission: Secondary | ICD-10-CM

## 2023-10-22 DIAGNOSIS — M545 Low back pain, unspecified: Secondary | ICD-10-CM | POA: Diagnosis not present

## 2023-10-23 ENCOUNTER — Ambulatory Visit
Admission: RE | Admit: 2023-10-23 | Discharge: 2023-10-23 | Disposition: A | Discharge status: Home / Self Care | Source: Ambulatory Visit | Attending: Internal Medicine | Admitting: Internal Medicine

## 2023-10-23 DIAGNOSIS — Z87891 Personal history of nicotine dependence: Secondary | ICD-10-CM | POA: Diagnosis not present

## 2023-10-23 DIAGNOSIS — Z122 Encounter for screening for malignant neoplasm of respiratory organs: Secondary | ICD-10-CM | POA: Diagnosis not present

## 2023-10-23 DIAGNOSIS — I7121 Aneurysm of the ascending aorta, without rupture: Secondary | ICD-10-CM | POA: Diagnosis not present

## 2023-10-23 DIAGNOSIS — F17201 Nicotine dependence, unspecified, in remission: Secondary | ICD-10-CM

## 2023-10-24 DIAGNOSIS — H4089 Other specified glaucoma: Secondary | ICD-10-CM | POA: Diagnosis not present

## 2023-10-28 DIAGNOSIS — M545 Low back pain, unspecified: Secondary | ICD-10-CM | POA: Diagnosis not present

## 2023-10-30 DIAGNOSIS — M545 Low back pain, unspecified: Secondary | ICD-10-CM | POA: Diagnosis not present

## 2023-12-16 ENCOUNTER — Other Ambulatory Visit (HOSPITAL_COMMUNITY): Payer: Self-pay | Admitting: Pharmacist

## 2023-12-16 MED ORDER — STUDY - ASPIRE - ASPIRIN 81 MG OR PLACEBO TABLET (PI-SETHI): 81.0000 mg | ORAL_TABLET | Freq: Every day | ORAL | 0 refills | Status: DC

## 2023-12-16 MED ORDER — STUDY - ASPIRE - APIXABAN 5 MG OR PLACEBO TABLET (PI-SETHI): 5.0000 mg | ORAL_TABLET | Freq: Two times a day (BID) | ORAL | 0 refills | Status: DC

## 2024-02-25 DEATH — deceased
